# Patient Record
Sex: Female | Born: 1935 | Race: White | Hispanic: No | Smoking: Never smoker
Health system: Southern US, Community
[De-identification: ages and names within clinical notes are randomized; demographics above are authoritative.]

## PROBLEM LIST (undated history)

## (undated) DIAGNOSIS — I4821 Permanent atrial fibrillation: Secondary | ICD-10-CM

## (undated) DIAGNOSIS — E663 Overweight: Secondary | ICD-10-CM

## (undated) DIAGNOSIS — I050 Rheumatic mitral stenosis: Secondary | ICD-10-CM

## (undated) DIAGNOSIS — M1990 Unspecified osteoarthritis, unspecified site: Secondary | ICD-10-CM

## (undated) DIAGNOSIS — I340 Nonrheumatic mitral (valve) insufficiency: Secondary | ICD-10-CM

## (undated) DIAGNOSIS — E785 Hyperlipidemia, unspecified: Secondary | ICD-10-CM

## (undated) DIAGNOSIS — I499 Cardiac arrhythmia, unspecified: Secondary | ICD-10-CM

## (undated) DIAGNOSIS — I2510 Atherosclerotic heart disease of native coronary artery without angina pectoris: Secondary | ICD-10-CM

## (undated) DIAGNOSIS — I5181 Takotsubo syndrome: Secondary | ICD-10-CM

## (undated) DIAGNOSIS — I10 Essential (primary) hypertension: Secondary | ICD-10-CM

## (undated) HISTORY — DX: Essential (primary) hypertension: I10

## (undated) HISTORY — DX: Rheumatic mitral stenosis: I05.0

## (undated) HISTORY — DX: Nonrheumatic mitral (valve) insufficiency: I34.0

## (undated) HISTORY — DX: Unspecified osteoarthritis, unspecified site: M19.90

## (undated) HISTORY — DX: Hyperlipidemia, unspecified: E78.5

## (undated) HISTORY — DX: Permanent atrial fibrillation: I48.21

## (undated) HISTORY — DX: Overweight: E66.3

---

## 1999-01-02 ENCOUNTER — Encounter: Admission: RE | Admit: 1999-01-02 | Discharge: 1999-01-02 | Payer: Self-pay | Admitting: Obstetrics & Gynecology

## 2001-11-29 ENCOUNTER — Emergency Department (HOSPITAL_COMMUNITY): Admission: EM | Admit: 2001-11-29 | Discharge: 2001-11-29 | Payer: Self-pay | Admitting: *Deleted

## 2001-12-05 ENCOUNTER — Emergency Department (HOSPITAL_COMMUNITY): Admission: EM | Admit: 2001-12-05 | Discharge: 2001-12-05 | Payer: Self-pay | Admitting: Emergency Medicine

## 2002-12-08 ENCOUNTER — Ambulatory Visit (HOSPITAL_COMMUNITY): Admission: RE | Admit: 2002-12-08 | Discharge: 2002-12-08 | Payer: Self-pay | Admitting: Family Medicine

## 2002-12-08 ENCOUNTER — Encounter: Payer: Self-pay | Admitting: Family Medicine

## 2002-12-08 ENCOUNTER — Other Ambulatory Visit: Admission: RE | Admit: 2002-12-08 | Discharge: 2002-12-08 | Payer: Self-pay | Admitting: Family Medicine

## 2004-03-14 ENCOUNTER — Ambulatory Visit (HOSPITAL_COMMUNITY): Admission: RE | Admit: 2004-03-14 | Discharge: 2004-03-14 | Payer: Self-pay | Admitting: Family Medicine

## 2005-03-20 ENCOUNTER — Ambulatory Visit (HOSPITAL_COMMUNITY): Admission: RE | Admit: 2005-03-20 | Discharge: 2005-03-20 | Payer: Self-pay | Admitting: Family Medicine

## 2008-08-09 ENCOUNTER — Ambulatory Visit (HOSPITAL_COMMUNITY): Admission: RE | Admit: 2008-08-09 | Discharge: 2008-08-09 | Payer: Self-pay | Admitting: Family Medicine

## 2011-03-15 NOTE — Procedures (Signed)
[facility]  Patient:    CELISSE, CIULLA Visit Number: [id number] MRN: [id number]          Service Type: EMS Location: ED Attending Physician:  Hilario Quarry Dictated by:   Kari Baars, M.D. Admit Date:  12/05/2001 Discharge Date: 12/05/2001                            EKG Interpretations  The computer has read this as sinus arrhythmia.  I believe it is atrial fibrillation.  The ventricular response is around 80.  There is slow R-wave progression across the precordium which may indicate previous anterior myocardial infarction, and clinical correlation is suggested.  There are lateral T-wave abnormalities which are nonspecific.  IMPRESSION:  Abnormal electrocardiogram. Dictated by:   Kari Baars, M.D. Attending Physician:  Hilario Quarry DD:  12/06/01 TD:  12/07/01 Job: [id number]

## 2013-05-24 ENCOUNTER — Ambulatory Visit
Admission: RE | Admit: 2013-05-24 | Discharge: 2013-05-24 | Disposition: A | Discharge status: Home / Self Care | Payer: Medicaid Other | Source: Ambulatory Visit | Attending: Family Medicine | Admitting: Family Medicine

## 2013-05-24 ENCOUNTER — Ambulatory Visit
Admission: RE | Admit: 2013-05-24 | Discharge: 2013-05-24 | Disposition: A | Discharge status: Home / Self Care | Payer: Medicare Other | Source: Ambulatory Visit | Attending: Family Medicine | Admitting: Family Medicine

## 2013-05-24 ENCOUNTER — Other Ambulatory Visit: Payer: Self-pay | Admitting: Family Medicine

## 2013-05-24 DIAGNOSIS — M25561 Pain in right knee: Secondary | ICD-10-CM

## 2013-05-24 DIAGNOSIS — M25562 Pain in left knee: Secondary | ICD-10-CM

## 2013-09-10 ENCOUNTER — Institutional Professional Consult (permissible substitution): Payer: Medicare Other | Admitting: Cardiology

## 2013-12-24 ENCOUNTER — Encounter: Payer: Self-pay | Admitting: Cardiology

## 2013-12-24 ENCOUNTER — Ambulatory Visit (INDEPENDENT_AMBULATORY_CARE_PROVIDER_SITE_OTHER): Payer: Medicare Other | Admitting: Cardiology

## 2013-12-24 ENCOUNTER — Encounter: Payer: Self-pay | Admitting: General Surgery

## 2013-12-24 ENCOUNTER — Telehealth: Payer: Self-pay | Admitting: Cardiology

## 2013-12-24 VITALS — BP 150/68 | HR 70 | Ht 64.0 in | Wt 192.0 lb

## 2013-12-24 DIAGNOSIS — I10 Essential (primary) hypertension: Secondary | ICD-10-CM

## 2013-12-24 DIAGNOSIS — I4891 Unspecified atrial fibrillation: Secondary | ICD-10-CM

## 2013-12-24 DIAGNOSIS — R011 Cardiac murmur, unspecified: Secondary | ICD-10-CM

## 2013-12-24 DIAGNOSIS — E785 Hyperlipidemia, unspecified: Secondary | ICD-10-CM | POA: Insufficient documentation

## 2013-12-24 DIAGNOSIS — I4821 Permanent atrial fibrillation: Secondary | ICD-10-CM | POA: Insufficient documentation

## 2013-12-24 LAB — CBC
HEMATOCRIT: 36.2 % (ref 36.0–46.0)
HEMOGLOBIN: 11.9 g/dL — AB (ref 12.0–15.0)
MCHC: 32.8 g/dL (ref 30.0–36.0)
MCV: 89.9 fl (ref 78.0–100.0)
Platelets: 207 10*3/uL (ref 150.0–400.0)
RBC: 4.03 Mil/uL (ref 3.87–5.11)
RDW: 14.3 % (ref 11.5–14.6)
WBC: 6.2 10*3/uL (ref 4.5–10.5)

## 2013-12-24 LAB — BASIC METABOLIC PANEL
BUN: 15 mg/dL (ref 6–23)
CALCIUM: 10 mg/dL (ref 8.4–10.5)
CO2: 28 mEq/L (ref 19–32)
CREATININE: 0.9 mg/dL (ref 0.4–1.2)
Chloride: 101 mEq/L (ref 96–112)
GFR: 66.38 mL/min (ref >60.00)
Glucose, Bld: 93 mg/dL (ref 70–99)
Potassium: 3.9 mEq/L (ref 3.5–5.1)
Sodium: 137 mEq/L (ref 135–145)

## 2013-12-24 MED ORDER — RIVAROXABAN 20 MG PO TABS: 20.0000 mg | ORAL_TABLET | Freq: Every day | ORAL | Status: DC

## 2013-12-24 NOTE — Progress Notes (Signed)
  [address] [place], [place]  [address] Phone: [phone] Fax:  [phone]  Date:  12/24/2013   ID:  JACQUESE YARWOOD, DOB 1936-04-05, MRN [id number]  PCP:  Augustine Radar, MD  Cardiologist:  Armanda Magic, MD     History of Present Illness: Anna [name] is a 78 y.o. female with a history of HTN who was diagnosed with atrial fibrillation in October of 2014 and was started on ASA by her PCP and set up to see Cardiology but she never kept the appt. She denies any chest pain, SOB, DOE, palpitations, dizziness or syncope.  She occasionally has some LE edema.     Wt Readings from Last 3 Encounters:  12/24/13 192 lb (87.091 kg)     Past Medical History  Diagnosis Date  . Arrhythmia   . Atrial fibrillation   . Hyperlipidemia   . Hypertension   . Osteoarthritis   . Overweight     Current Outpatient Prescriptions  Medication Sig Dispense Refill  . amLODipine-olmesartan (AZOR) 5-20 MG per tablet Take 1 tablet by mouth daily.      Marland Kitchen aspirin 325 MG EC tablet Take 325 mg by mouth daily.      . celecoxib (CELEBREX) 200 MG capsule Take 200 mg by mouth 2 (two) times daily as needed.      . diclofenac sodium (VOLTAREN) 1 % GEL Apply 4 g topically 4 (four) times daily.      . Multiple Vitamins-Minerals (CENTRUM SILVER PO) Take by mouth.      . Omega-3 Fatty Acids (FISH OIL CONCENTRATE PO) Take by mouth.      . rosuvastatin (CRESTOR) 10 MG tablet Take 10 mg by mouth daily.       No current facility-administered medications for this visit.    Allergies:   No Known Allergies  Social History:  The patient  reports that she has never smoked. She does not have any smokeless tobacco history on file. She reports that she does not drink alcohol or use illicit drugs.   Family History:  The patient's family history includes Alzheimer's disease in her mother.   ROS:  Please see the history of present illness.      All other systems reviewed and negative.   PHYSICAL EXAM: VS:   BP 150/68  Pulse 70  Ht 5\' 4"  (1.626 m)  Wt 192 lb (87.091 kg)  BMI 32.94 kg/m2  SpO2 99% Well nourished, well developed, in no acute distress HEENT: normal Neck: no JVD Cardiac:  normal S1, S2; irregularly irregular; 2/6 SM at RUSB Lungs:  clear to auscultation bilaterally, no wheezing, rhonchi or rales Abd: soft, nontender, no hepatomegaly Ext: no edema Skin: warm and dry Neuro:  CNs 2-12 intact, no focal abnormalities noted  EKG:  Atrial fibrillation with CVR     ASSESSMENT AND PLAN:  1. New onset atrial fibrillation of unknown duration with controlled ventricular response.  She is completely asymptomatic.  Her CHADS VASC score is 69 (female, age > 12 and HTN)  - start Xarelto 20mg  daily.  Last renal function was normal  - instructed to stop ASA  - check NOAC panel  - followup with me in 4 weeks and if still in afib will set up for DCCV 2. HTN - well controlled  - continue Azor  3. Heart murmur  - check 2D echo to assess murmur and assess LVF  Signed, Armanda Magic, MD 12/24/2013 12:00 PM

## 2013-12-24 NOTE — Telephone Encounter (Signed)
Called and spoke to pt and I was told that they will not give her the year free. That they would only do the 30 day supply. I called Victorino Dike with Xarelto and am waiting on a return call to let me know what to tell the pt.

## 2013-12-24 NOTE — Telephone Encounter (Signed)
New Message  Pt called-- requests a call back to "activate card" to start Xarelto. Pt was not clear of her request. Please call back to discuss furhter.

## 2013-12-24 NOTE — Patient Instructions (Signed)
Your physician has recommended you make the following change in your medication: 1. Stop Aspirin  2. Start Xarelto 20 MG 1 tablet daily  Your physician recommends that you go to the lab today for a BMET and CBC  Your physician has requested that you have an echocardiogram. Echocardiography is a painless test that uses sound waves to create images of your heart. It provides your doctor with information about the size and shape of your heart and how well your heart's chambers and valves are working. This procedure takes approximately one hour. There are no restrictions for this procedure.  Your physician recommends that you schedule a follow-up appointment in: 4 weeks with Dr Mayford Knife

## 2013-12-27 ENCOUNTER — Telehealth: Payer: Self-pay | Admitting: Cardiology

## 2013-12-27 NOTE — Telephone Encounter (Signed)
New message      Calling to get test results from nurse

## 2013-12-27 NOTE — Telephone Encounter (Signed)
Notified of lab results. States she has not been able to get Xarelto 20 mg from pharmacy.  She will let us know if we need to do prior authorization. She has a 30 day card. No samples available.

## 2014-01-03 ENCOUNTER — Telehealth: Payer: Self-pay | Admitting: *Deleted

## 2014-01-03 NOTE — Telephone Encounter (Signed)
PA to [facility] for xarelto 

## 2014-01-05 NOTE — Telephone Encounter (Signed)
[facility] approved xarelto through 01/03/2014, PA # [id number]

## 2014-01-12 ENCOUNTER — Ambulatory Visit (HOSPITAL_COMMUNITY): Payer: Medicare Other | Attending: Cardiology | Admitting: Radiology

## 2014-01-12 DIAGNOSIS — Z6833 Body mass index (BMI) 33.0-33.9, adult: Secondary | ICD-10-CM | POA: Insufficient documentation

## 2014-01-12 DIAGNOSIS — E669 Obesity, unspecified: Secondary | ICD-10-CM | POA: Insufficient documentation

## 2014-01-12 DIAGNOSIS — E785 Hyperlipidemia, unspecified: Secondary | ICD-10-CM | POA: Insufficient documentation

## 2014-01-12 DIAGNOSIS — I10 Essential (primary) hypertension: Secondary | ICD-10-CM | POA: Insufficient documentation

## 2014-01-12 DIAGNOSIS — R011 Cardiac murmur, unspecified: Secondary | ICD-10-CM | POA: Insufficient documentation

## 2014-01-12 DIAGNOSIS — I4891 Unspecified atrial fibrillation: Secondary | ICD-10-CM | POA: Insufficient documentation

## 2014-01-12 DIAGNOSIS — I059 Rheumatic mitral valve disease, unspecified: Secondary | ICD-10-CM | POA: Insufficient documentation

## 2014-01-12 NOTE — Progress Notes (Signed)
Echocardiogram Performed. 

## 2014-01-21 ENCOUNTER — Ambulatory Visit: Payer: Medicare Other | Admitting: Cardiology

## 2014-03-01 ENCOUNTER — Encounter: Payer: Self-pay | Admitting: Cardiology

## 2014-03-01 ENCOUNTER — Ambulatory Visit (INDEPENDENT_AMBULATORY_CARE_PROVIDER_SITE_OTHER): Payer: Medicare Other | Admitting: Cardiology

## 2014-03-01 VITALS — BP 164/69 | HR 52 | Ht 64.0 in | Wt 189.0 lb

## 2014-03-01 DIAGNOSIS — I4891 Unspecified atrial fibrillation: Secondary | ICD-10-CM

## 2014-03-01 DIAGNOSIS — I10 Essential (primary) hypertension: Secondary | ICD-10-CM

## 2014-03-01 DIAGNOSIS — I4892 Unspecified atrial flutter: Secondary | ICD-10-CM

## 2014-03-01 DIAGNOSIS — I059 Rheumatic mitral valve disease, unspecified: Secondary | ICD-10-CM

## 2014-03-01 DIAGNOSIS — I340 Nonrheumatic mitral (valve) insufficiency: Secondary | ICD-10-CM | POA: Insufficient documentation

## 2014-03-01 NOTE — Patient Instructions (Signed)
Your physician recommends that you continue on your current medications as directed. Please refer to the Current Medication list given to you today.  Your physician recommends that you schedule a follow-up appointment in: 3 months with Dr Turner 

## 2014-03-01 NOTE — Progress Notes (Signed)
[address] [place], [place]  [address] Phone: [phone] Fax:  [phone]  Date:  03/01/2014   ID:  Anna [name], DOB 30-May-1936, MRN [id number]  PCP:  Augustine RadarOBERSON, KRISTINA, MD  Cardiologist:  Armanda Magicraci TUrner, MD     History of Present Illness:  Anna [name] is a 78 y.o. female with a history of HTN who was diagnosed with atrial fibrillation in October of 2014 and was started on ASA by her PCP and set up to see Cardiology but she never kept the appt. She saw me back in February and was in atrial fibrillation with CVR.  She was started on Xarelto due to her CHADS2VASC score of 3.  A 2D echo was done which showed normal LVF with mild to moderate MR, and mild TR and moderately dilated LA.  She was supposed to followup in March but is now here today.  She denies any chest pain, SOB, DOE, palpitations, dizziness or syncope. She occasionally has some LE edema.    Wt Readings from Last 3 Encounters:  03/01/14 189 lb (85.73 kg)  12/24/13 192 lb (87.091 kg)     Past Medical History  Diagnosis Date  . Arrhythmia   . Atrial fibrillation   . Hyperlipidemia   . Hypertension   . Osteoarthritis   . Overweight     Current Outpatient Prescriptions  Medication Sig Dispense Refill  . amLODipine-olmesartan (AZOR) 5-20 MG per tablet Take 1 tablet by mouth daily.      . celecoxib (CELEBREX) 200 MG capsule Take 200 mg by mouth 2 (two) times daily as needed.      . diclofenac sodium (VOLTAREN) 1 % GEL Apply 4 g topically 4 (four) times daily.      . Multiple Vitamins-Minerals (CENTRUM SILVER PO) Take by mouth.      . Omega-3 Fatty Acids (FISH OIL CONCENTRATE PO) Take by mouth.      . Rivaroxaban (XARELTO) 20 MG TABS tablet Take 1 tablet (20 mg total) by mouth daily with supper.  30 tablet  11  . rosuvastatin (CRESTOR) 10 MG tablet Take 10 mg by mouth daily.       No current facility-administered medications for this visit.    Allergies:   No Known Allergies  Social History:  The  patient  reports that she has never smoked. She does not have any smokeless tobacco history on file. She reports that she does not drink alcohol or use illicit drugs.   Family History:  The patient's family history includes Alzheimer's disease in her mother.   ROS:  Please see the history of present illness.      All other systems reviewed and negative.   PHYSICAL EXAM: VS:  BP 164/69  Pulse 52  Ht 5\' 4"  (1.626 m)  Wt 189 lb (85.73 kg)  BMI 32.43 kg/m2 Well nourished, well developed, in no acute distress HEENT: normal Neck: no JVD Cardiac:  normal S1, S2; irregularly irregular; no murmur Lungs:  clear to auscultation bilaterally, no wheezing, rhonchi or rales Abd: soft, nontender, no hepatomegaly Ext: 1+ edema Skin: warm and dry Neuro:  CNs 2-12 intact, no focal abnormalities noted  EKG:   Atrial flutter with CVR at 52bpm  ASSESSMENT AND PLAN: 1.  Atrial fibrillation now in atrial flutter rate controlled - she has not missed any doses of her Xarelto since being placed on it in February except for 1 dose on April 16th. - I would like to set her  up for outpt DCCV but she is hesitant.  I have reviewed the benefits and risks of rhythm vs. Rhythm control.    I have reviewed the risks and benefits with her including the risk of anesthesia, cardioverting to another rhythm that requires repeat DCCV, temporary pacing through pacing pads.  I have given her some literature to review on cardioversion and she will let me know if she wants to proceed.   2.  HTN - mildly elevated BP - Continue Azor to 5-20mg  daily 3.  Mild to moderate MR by recent echo  Followup with me in 3 months  Signed, Armanda Magic, MD 03/01/2014 8:28 AM

## 2014-03-18 ENCOUNTER — Telehealth: Payer: Self-pay | Admitting: Cardiology

## 2014-03-18 NOTE — Telephone Encounter (Signed)
New problem   Pt want to speak to nurse concerning the Jump Start Your Heart procedure. Please call pt.

## 2014-03-18 NOTE — Telephone Encounter (Signed)
Calling wanting to proceed with the cardioversion.  Advised both Dr. Mayford Knife and Jillyn Hidden out of office today.  Will forward to them as to when to schedule.

## 2014-03-21 NOTE — Telephone Encounter (Signed)
Please set up for DCCV on Thursday 5/28 with me

## 2014-03-22 ENCOUNTER — Encounter: Payer: Self-pay | Admitting: *Deleted

## 2014-03-22 ENCOUNTER — Encounter (HOSPITAL_COMMUNITY): Payer: Self-pay | Admitting: Pharmacy Technician

## 2014-03-22 NOTE — Telephone Encounter (Signed)
No.  Nothing else needs to be done at this time.

## 2014-03-22 NOTE — Telephone Encounter (Addendum)
To Triage.

## 2014-03-22 NOTE — Telephone Encounter (Signed)
Pt advised/ instructions reviewed with patient.

## 2014-03-22 NOTE — Telephone Encounter (Signed)
I put orders in do I need to do anything else?

## 2014-03-22 NOTE — Telephone Encounter (Signed)
In Basket to prior authorization

## 2014-03-22 NOTE — Telephone Encounter (Signed)
Cardioversion scheduled for 03/24/14 11AM.

## 2014-03-24 ENCOUNTER — Encounter (HOSPITAL_COMMUNITY): Payer: Medicare Other | Admitting: Certified Registered Nurse Anesthetist

## 2014-03-24 ENCOUNTER — Ambulatory Visit (HOSPITAL_COMMUNITY)
Admission: RE | Admit: 2014-03-24 | Discharge: 2014-03-24 | Disposition: A | Discharge status: Home / Self Care | Payer: Medicare Other | Source: Ambulatory Visit | Attending: Cardiology | Admitting: Cardiology

## 2014-03-24 ENCOUNTER — Telehealth: Payer: Self-pay | Admitting: Cardiology

## 2014-03-24 ENCOUNTER — Ambulatory Visit (HOSPITAL_COMMUNITY): Payer: Medicare Other | Admitting: Certified Registered Nurse Anesthetist

## 2014-03-24 ENCOUNTER — Encounter (HOSPITAL_COMMUNITY)
Admission: RE | Disposition: A | Discharge status: Home / Self Care | Payer: Self-pay | Source: Ambulatory Visit | Attending: Cardiology

## 2014-03-24 DIAGNOSIS — I10 Essential (primary) hypertension: Secondary | ICD-10-CM | POA: Diagnosis not present

## 2014-03-24 DIAGNOSIS — I4891 Unspecified atrial fibrillation: Secondary | ICD-10-CM | POA: Insufficient documentation

## 2014-03-24 DIAGNOSIS — M1990 Unspecified osteoarthritis, unspecified site: Secondary | ICD-10-CM | POA: Insufficient documentation

## 2014-03-24 DIAGNOSIS — Z7901 Long term (current) use of anticoagulants: Secondary | ICD-10-CM | POA: Insufficient documentation

## 2014-03-24 DIAGNOSIS — E785 Hyperlipidemia, unspecified: Secondary | ICD-10-CM | POA: Diagnosis not present

## 2014-03-24 DIAGNOSIS — E663 Overweight: Secondary | ICD-10-CM | POA: Insufficient documentation

## 2014-03-24 HISTORY — PX: CARDIOVERSION: SHX1299

## 2014-03-24 LAB — BASIC METABOLIC PANEL
BUN: 13 mg/dL (ref 6–23)
CHLORIDE: 100 meq/L (ref 96–112)
CO2: 25 meq/L (ref 19–32)
Calcium: 10.3 mg/dL (ref 8.4–10.5)
Creatinine, Ser: 0.99 mg/dL (ref 0.50–1.10)
GFR calc Af Amer: 63 mL/min — ABNORMAL LOW (ref >90)
GFR calc non Af Amer: 54 mL/min — ABNORMAL LOW (ref >90)
GLUCOSE: 98 mg/dL (ref 70–99)
Potassium: 4.4 mEq/L (ref 3.7–5.3)
SODIUM: 138 meq/L (ref 137–147)

## 2014-03-24 LAB — MAGNESIUM: Magnesium: 1.7 mg/dL (ref 1.5–2.5)

## 2014-03-24 SURGERY — CARDIOVERSION
Anesthesia: Monitor Anesthesia Care

## 2014-03-24 MED ORDER — HYDROCORTISONE 1 % EX CREA
1.0000 | TOPICAL_CREAM | Freq: Three times a day (TID) | CUTANEOUS | Status: DC | PRN
Start: 2014-03-24 — End: 2014-03-24
  Filled 2014-03-24: qty 28

## 2014-03-24 MED ORDER — SODIUM CHLORIDE 0.9 % IJ SOLN: 3.0000 mL | INTRAMUSCULAR | Status: DC | PRN

## 2014-03-24 MED ORDER — PROPOFOL 10 MG/ML IV BOLUS
INTRAVENOUS | Status: DC | PRN
  Administered 2014-03-24: 80 mg via INTRAVENOUS

## 2014-03-24 MED ORDER — SODIUM CHLORIDE 0.9 % IV SOLN
INTRAVENOUS | Status: DC | PRN
  Administered 2014-03-24: 10:00:00 via INTRAVENOUS

## 2014-03-24 MED ORDER — SODIUM CHLORIDE 0.9 % IJ SOLN: 3.0000 mL | Freq: Two times a day (BID) | INTRAMUSCULAR | Status: DC

## 2014-03-24 MED ORDER — SODIUM CHLORIDE 0.9 % IV SOLN: INTRAVENOUS | Status: DC

## 2014-03-24 MED ORDER — SODIUM CHLORIDE 0.9 % IV SOLN: 250.0000 mL | INTRAVENOUS | Status: DC

## 2014-03-24 NOTE — Telephone Encounter (Signed)
Please let patient know that EKG prior to her discharge from [facility] today after DCCV showed that she had gone into atrial flutter which is a more organized heart rhythm than afib. Her HR is controlled.  She is to stay on her blood thinner and followup with me at the scheduled appt to discuss further treatment.

## 2014-03-24 NOTE — H&P (Signed)
ID: DAZJA ONSTAD, DOB [birth year], MRN [id number]  PCP: Augustine Radar, MD  Cardiologist: Armanda Magic, MD  History of Present Illness:  Anna [name] is a 78 y.o. female with a history of HTN who was diagnosed with atrial fibrillation in October of 2014 and was started on ASA by her PCP and set up to see Cardiology but she never kept the appt. She saw me back in February and was in atrial fibrillation with CVR. She was started on Xarelto due to her CHADS2VASC score of 3. A 2D echo was done which showed normal LVF with mild to moderate MR, and mild TR and moderately dilated LA. She was supposed to followup in March but is now here today. She denies any chest pain, SOB, DOE, palpitations, dizziness or syncope. She occasionally has some LE edema.  Wt Readings from Last 3 Encounters:   03/01/14  189 lb (85.73 kg)   12/24/13  192 lb (87.091 kg)    Past Medical History   Diagnosis  Date   .  Arrhythmia    .  Atrial fibrillation    .  Hyperlipidemia    .  Hypertension    .  Osteoarthritis    .  Overweight     Current Outpatient Prescriptions   Medication  Sig  Dispense  Refill   .  amLODipine-olmesartan (AZOR) 5-20 MG per tablet  Take 1 tablet by mouth daily.     .  celecoxib (CELEBREX) 200 MG capsule  Take 200 mg by mouth 2 (two) times daily as needed.     .  diclofenac sodium (VOLTAREN) 1 % GEL  Apply 4 g topically 4 (four) times daily.     .  Multiple Vitamins-Minerals (CENTRUM SILVER PO)  Take by mouth.     .  Omega-3 Fatty Acids (FISH OIL CONCENTRATE PO)  Take by mouth.     .  Rivaroxaban (XARELTO) 20 MG TABS tablet  Take 1 tablet (20 mg total) by mouth daily with supper.  30 tablet  11   .  rosuvastatin (CRESTOR) 10 MG tablet  Take 10 mg by mouth daily.      No current facility-administered medications for this visit.   Allergies: No Known Allergies  Social History: The patient reports that she has never smoked. She does not have any smokeless tobacco history on file. She  reports that she does not drink alcohol or use illicit drugs.  Family History: The patient's family history includes Alzheimer's disease in her mother.  ROS: Please see the history of present illness. All other systems reviewed and negative.  PHYSICAL EXAM:  VS: BP 164/69  Pulse 52  Ht 5\' 4"  (1.626 m)  Wt 189 lb (85.73 kg)  BMI 32.43 kg/m2  Well nourished, well developed, in no acute distress  HEENT: normal  Neck: no JVD  Cardiac: normal S1, S2; irregularly irregular; no murmur  Lungs: clear to auscultation bilaterally, no wheezing, rhonchi or rales  Abd: soft, nontender, no hepatomegaly  Ext: 1+ edema  Skin: warm and dry  Neuro: CNs 2-12 intact, no focal abnormalities noted  EKG: Atrial flutter with CVR at 52bpm  ASSESSMENT AND PLAN:  1. Atrial fibrillation now in atrial flutter rate controlled  - she has not missed any doses of her Xarelto since being placed on it in February except for 1 dose on April 16th.  - She will be set up for DCCV. I have reviewed the benefits and risks of rhythm vs.  Rhythm control. I have reviewed the risks and benefits with her including the risk of anesthesia, cardioverting to another rhythm that requires repeat DCCV, temporary pacing through pacing pads.  2. HTN - mildly elevated BP  - Continue Azor to 5-20mg  daily  3. Mild to moderate MR by recent echo    Signed,  Armanda Magicraci Turner, MD

## 2014-03-24 NOTE — Anesthesia Postprocedure Evaluation (Signed)
  Anesthesia Post-op Note  Patient: Anna [name]  Procedure(s) Performed: Procedure(s): CARDIOVERSION (N/A)  Patient Location: Endoscopy Unit  Anesthesia Type:General  Level of Consciousness: awake, alert , oriented and patient cooperative  Airway and Oxygen Therapy: Patient Spontanous Breathing and Patient connected to nasal cannula oxygen  Post-op Pain: none  Post-op Assessment: Post-op Vital signs reviewed, Patient's Cardiovascular Status Stable, Respiratory Function Stable, Patent Airway and No signs of Nausea or vomiting  Post-op Vital Signs: Reviewed and stable  Last Vitals:  Filed Vitals:   03/24/14 1238  BP: 150/52  Pulse: 53  Temp:   Resp: 14    Complications: No apparent anesthesia complications

## 2014-03-24 NOTE — Discharge Instructions (Signed)
Electrical Cardioversion, Care After °Refer to this sheet in the next few weeks. These instructions provide you with information on caring for yourself after your procedure. Your health care provider may also give you more specific instructions. Your treatment has been planned according to current medical practices, but problems sometimes occur. Call your health care provider if you have any problems or questions after your procedure. °WHAT TO EXPECT AFTER THE PROCEDURE °After your procedure, it is typical to have the following sensations: °· Some redness on the skin where the shocks were delivered. If this is tender, a sunburn lotion or hydrocortisone cream may help. °· Possible return of an abnormal heart rhythm within hours or days after the procedure. °HOME CARE INSTRUCTIONS °· Only take medicine as directed by your health care provider. Be sure you understand how and when to take your medicine. °· Learn how to feel your pulse and check it often. °· Limit your activity for 48 hours after the procedure or as directed. °· Avoid or minimize caffeine and other stimulants as directed. °SEEK MEDICAL CARE IF: °· You feel like your heart is beating too fast or your pulse is not regular. °· You have any questions about your medicines. °· You have bleeding that will not stop. °SEEK IMMEDIATE MEDICAL CARE IF: °· You are dizzy or feel faint. °· It is hard to breathe or you feel short of breath. °· There is a change in discomfort in your chest. °· Your speech is slurred or you have trouble moving an arm or leg on one side of your body. °· You get a serious muscle cramp that does not go away. °· Your fingers or toes turn cold or blue. °MAKE SURE YOU:  °· Understand these instructions.   °· Will watch your condition.   °· Will get help right away if you are not doing well or get worse. °Document Released: 08/04/2013 Document Reviewed: 04/28/2013 °ExitCare® Patient Information ©2014 [employer]. ° °Monitored Anesthesia Care   °Monitored anesthesia care is an anesthesia service for a medical procedure. Anesthesia is the loss of the ability to feel pain. It is produced by medications called anesthetics. It may affect a small area of your body (local anesthesia), a large area of your body (regional anesthesia), or your entire body (general anesthesia). The need for monitored anesthesia care depends your procedure, your condition, and the potential need for regional or general anesthesia. It is often provided during procedures where:  °· General anesthesia may be needed if there are complications. This is because you need special care when you are under general anesthesia.   °· You will be under local or regional anesthesia. This is so that you are able to have higher levels of anesthesia if needed.   °· You will receive calming medications (sedatives). This is especially the case if sedatives are given to put you in a semi-conscious state of relaxation (deep sedation). This is because the amount of sedative needed to produce this state can be hard to predict. Too much of a sedative can produce general anesthesia. °Monitored anesthesia care is performed by one or more caregivers who have special training in all types of anesthesia. You will need to meet with these caregivers before your procedure. During this meeting, they will ask you about your medical history. They will also give you instructions to follow. (For example, you will need to stop eating and drinking before your procedure. You may also need to stop or change medications you are taking.) During your procedure, your   caregivers will stay with you. They will:  °· Watch your condition. This includes watching you blood pressure, breathing, and level of pain.   °· Diagnose and treat problems that occur.   °· Give medications if they are needed. These may include calming medications (sedatives) and anesthetics.   °· Make sure you are comfortable.   °Having monitored anesthesia care  does not necessarily mean that you will be under anesthesia. It does mean that your caregivers will be able to manage anesthesia if you need it or if it occurs. It also means that you will be able to have a different type of anesthesia than you are having if you need it. When your procedure is complete, your caregivers will continue to watch your condition. They will make sure any medications wear off before you are allowed to go home.  °Document Released: 07/10/2005 Document Revised: 02/08/2013 Document Reviewed: 11/25/2012 °ExitCare® Patient Information ©2014 [employer]. ° ° °

## 2014-03-24 NOTE — Transfer of Care (Signed)
Immediate Anesthesia Transfer of Care Note  Patient: Anna [name]  Procedure(s) Performed: Procedure(s): CARDIOVERSION (N/A)  Patient Location: Endoscopy Unit  Anesthesia Type:General  Level of Consciousness: awake, alert , oriented and patient cooperative  Airway & Oxygen Therapy: Patient Spontanous Breathing and Patient connected to nasal cannula oxygen  Post-op Assessment: Report given to PACU RN, Post -op Vital signs reviewed and stable and Patient moving all extremities  Post vital signs: Reviewed and stable  Complications: No apparent anesthesia complications

## 2014-03-24 NOTE — CV Procedure (Signed)
    Electrical Cardioversion Procedure Note MATTI DIPAOLA [id number] 1936/08/01  Procedure: Electrical Cardioversion Indications:  Atrial Fibrillation  Time Out: Verified patient identification, verified procedure,medications/allergies/relevent history reviewed, required imaging and test results available.  Performed  Procedure Details  The patient was NPO after midnight. Anesthesia was administered at the beside  by Dr.Rose with 80mg  of propofol.  Cardioversion was done with synchronized biphasic defibrillation with AP pads with 150watts.  The patient converted to sinus bradycardia with 1st degree AV block. The patient tolerated the procedure well   IMPRESSION:  Successful cardioversion of atrial fibrillation    Traci R Turner 03/24/2014, 10:29 AM

## 2014-03-24 NOTE — Interval H&P Note (Signed)
History and Physical Interval Note:  03/24/2014 9:49 AM  Anna [name]  has presented today for surgery, with the diagnosis of afib  The various methods of treatment have been discussed with the patient and family. After consideration of risks, benefits and other options for treatment, the patient has consented to  Procedure(s): CARDIOVERSION (N/A) as a surgical intervention .  The patient's history has been reviewed, patient examined, no change in status, stable for surgery.  I have reviewed the patient's chart and labs.  Questions were answered to the patient's satisfaction.     Quintella Reichert

## 2014-03-24 NOTE — Anesthesia Preprocedure Evaluation (Addendum)
Anesthesia Evaluation  Patient identified by MRN, date of birth, ID band Patient awake    Reviewed: Allergy & Precautions, H&P , NPO status , Patient's Chart, lab work & pertinent test results  Airway Mallampati: II TM Distance: >3 FB Neck ROM: Full    Dental  (+) Edentulous Upper, Edentulous Lower   Pulmonary neg pulmonary ROS,          Cardiovascular hypertension, Pt. on medications + dysrhythmias Atrial Fibrillation     Neuro/Psych negative neurological ROS  negative psych ROS   GI/Hepatic negative GI ROS, Neg liver ROS,   Endo/Other  negative endocrine ROS  Renal/GU negative Renal ROS  negative genitourinary   Musculoskeletal negative musculoskeletal ROS (+)   Abdominal   Peds negative pediatric ROS (+)  Hematology negative hematology ROS (+)   Anesthesia Other Findings   Reproductive/Obstetrics negative OB ROS                          Anesthesia Physical Anesthesia Plan  ASA: III  Anesthesia Plan: MAC   Post-op Pain Management:    Induction: Intravenous  Airway Management Planned: Mask  Additional Equipment:   Intra-op Plan:   Post-operative Plan:   Informed Consent: I have reviewed the patients History and Physical, chart, labs and discussed the procedure including the risks, benefits and alternatives for the proposed anesthesia with the patient or authorized representative who has indicated his/her understanding and acceptance.   Dental advisory given  Plan Discussed with: CRNA, Anesthesiologist and Surgeon  Anesthesia Plan Comments:         Anesthesia Quick Evaluation

## 2014-03-25 ENCOUNTER — Encounter (HOSPITAL_COMMUNITY): Payer: Self-pay | Admitting: Cardiology

## 2014-03-26 ENCOUNTER — Emergency Department (HOSPITAL_COMMUNITY)
Admission: EM | Admit: 2014-03-26 | Discharge: 2014-03-26 | Disposition: A | Discharge status: Home / Self Care | Payer: Medicare Other | Attending: Emergency Medicine | Admitting: Emergency Medicine

## 2014-03-26 ENCOUNTER — Encounter (HOSPITAL_COMMUNITY): Payer: Self-pay | Admitting: Emergency Medicine

## 2014-03-26 DIAGNOSIS — E663 Overweight: Secondary | ICD-10-CM | POA: Diagnosis not present

## 2014-03-26 DIAGNOSIS — E785 Hyperlipidemia, unspecified: Secondary | ICD-10-CM | POA: Insufficient documentation

## 2014-03-26 DIAGNOSIS — Z79899 Other long term (current) drug therapy: Secondary | ICD-10-CM | POA: Insufficient documentation

## 2014-03-26 DIAGNOSIS — I498 Other specified cardiac arrhythmias: Secondary | ICD-10-CM | POA: Insufficient documentation

## 2014-03-26 DIAGNOSIS — Z8739 Personal history of other diseases of the musculoskeletal system and connective tissue: Secondary | ICD-10-CM | POA: Diagnosis not present

## 2014-03-26 DIAGNOSIS — R001 Bradycardia, unspecified: Secondary | ICD-10-CM

## 2014-03-26 DIAGNOSIS — I4891 Unspecified atrial fibrillation: Secondary | ICD-10-CM | POA: Insufficient documentation

## 2014-03-26 DIAGNOSIS — I10 Essential (primary) hypertension: Secondary | ICD-10-CM | POA: Insufficient documentation

## 2014-03-26 LAB — CBC
HCT: 32.2 % — ABNORMAL LOW (ref 36.0–46.0)
HEMOGLOBIN: 11 g/dL — AB (ref 12.0–15.0)
MCH: 29.3 pg (ref 26.0–34.0)
MCHC: 34.2 g/dL (ref 30.0–36.0)
MCV: 85.6 fL (ref 78.0–100.0)
Platelets: 184 10*3/uL (ref 150–400)
RBC: 3.76 MIL/uL — AB (ref 3.87–5.11)
RDW: 14 % (ref 11.5–15.5)
WBC: 5.5 10*3/uL (ref 4.0–10.5)

## 2014-03-26 LAB — BASIC METABOLIC PANEL
BUN: 21 mg/dL (ref 6–23)
CO2: 22 meq/L (ref 19–32)
Calcium: 10 mg/dL (ref 8.4–10.5)
Chloride: 99 mEq/L (ref 96–112)
Creatinine, Ser: 1.13 mg/dL — ABNORMAL HIGH (ref 0.50–1.10)
GFR calc Af Amer: 53 mL/min — ABNORMAL LOW (ref >90)
GFR calc non Af Amer: 46 mL/min — ABNORMAL LOW (ref >90)
GLUCOSE: 130 mg/dL — AB (ref 70–99)
Potassium: 4.4 mEq/L (ref 3.7–5.3)
Sodium: 133 mEq/L — ABNORMAL LOW (ref 137–147)

## 2014-03-26 NOTE — Discharge Instructions (Signed)
Bradycardia Bradycardia is a term for a heart rate (pulse) that, in adults, is slower than 60 beats per minute. A normal rate is 60 to 100 beats per minute. A heart rate below 60 beats per minute may be normal for some adults with healthy hearts. If the rate is too slow, the heart may have trouble pumping the volume of blood the body needs. If the heart rate gets too low, blood flow to the brain may be decreased and may make you feel lightheaded, dizzy, or faint. The heart has a natural pacemaker in the top of the heart called the SA node (sinoatrial or sinus node). This pacemaker sends out regular electrical signals to the muscle of the heart, telling the heart muscle when to beat (contract). The electrical signal travels from the upper parts of the heart (atria) through the AV node (atrioventricular node), to the lower chambers of the heart (ventricles). The ventricles squeeze, pumping the blood from your heart to your lungs and to the rest of your body. CAUSES   Problem with the heart's electrical system.  Problem with the heart's natural pacemaker.  Heart disease, damage, or infection.  Medications.  Problems with minerals and salts (electrolytes). SYMPTOMS   Fainting (syncope).  Fatigue and weakness.  Shortness of breath (dyspnea).  Chest pain (angina).  Drowsiness.  Confusion. DIAGNOSIS   An electrocardiogram (ECG) can help your caregiver determine the type of slow heart rate you have.  If the cause is not seen on an ECG, you may need to wear a heart monitor that records your heart rhythm for several hours or days.  Blood tests. TREATMENT   Electrolyte supplements.  Medications.  Withholding medication which is causing a slow heart rate.  Pacemaker placement. SEEK IMMEDIATE MEDICAL CARE IF:   You feel lightheaded or faint.  You develop an irregular heart rate.  You feel chest pain or have trouble breathing. MAKE SURE YOU:   Understand these  instructions.  Will watch your condition.  Will get help right away if you are not doing well or get worse. Document Released: 07/06/2002 Document Revised: 01/06/2012 Document Reviewed: 06/01/2008 [employer] Patient Information 2014 [employer].  

## 2014-03-26 NOTE — ED Notes (Signed)
Pt from home with c/o, pt has cardioversion done recently and had a home visit follow up where nurse found pt Heart Rate to be 42. Pt Heart Rate 47 in triage. Pt has no complaints at present.

## 2014-03-26 NOTE — ED Provider Notes (Signed)
CSN: [id number]     Arrival date & time 03/26/14  1557 History   First MD Initiated Contact with Patient 03/26/14 1607     Chief Complaint  Patient presents with  . Bradycardia     (Consider location/radiation/quality/duration/timing/severity/associated sxs/prior Treatment) HPI Comments: Patient is a 78 y.o. Female who 2 days post cardioversion for afib who presents to the ED after being found to have low heart rate by home health nurse.  Patient has no current complaints including no CP, SOB, fatigue, dizziness, or diaphoresis.  Home health nurse informed patient that she needed to come to ER for evaluation.  Review of MR shows that after DCCV patient was noted to have a HR of 53 with atrial flutter rhythm.    Patient is a 78 y.o. female presenting with general illness. The history is provided by the patient and medical records. No language interpreter was used.  Illness Severity:  Unable to specify Onset quality:  Unable to specify Timing:  Constant Progression:  Unchanged Chronicity:  New Associated symptoms: no abdominal pain, no chest pain, no cough, no fever, no nausea, no shortness of breath, no vomiting and no wheezing     Past Medical History  Diagnosis Date  . Arrhythmia   . Atrial fibrillation   . Hyperlipidemia   . Hypertension   . Osteoarthritis   . Overweight    Past Surgical History  Procedure Laterality Date  . Cardioversion N/A 03/24/2014    Procedure: CARDIOVERSION;  Surgeon: Quintella Reichert, MD;  Location: [facility];  Service: Cardiovascular;  Laterality: N/A;   Family History  Problem Relation Age of Onset  . Alzheimer's disease Mother    History  Substance Use Topics  . Smoking status: Never Smoker   . Smokeless tobacco: Not on file  . Alcohol Use: No   OB History   Grav Para Term Preterm Abortions TAB SAB Ect Mult Living                 Review of Systems  Constitutional: Negative for fever.  Respiratory: Negative for cough, shortness of  breath and wheezing.   Cardiovascular: Negative for chest pain.  Gastrointestinal: Negative for nausea, vomiting and abdominal pain.  All other systems reviewed and are negative.     Allergies  Review of patient's allergies indicates no known allergies.  Home Medications   Prior to Admission medications   Medication Sig Start Date End Date Taking? Authorizing Provider  amLODipine-olmesartan (AZOR) 5-20 MG per tablet Take 1 tablet by mouth daily.    Historical Provider, MD  diclofenac sodium (VOLTAREN) 1 % GEL Apply 4 g topically 4 (four) times daily.    Historical Provider, MD  docusate sodium (COLACE) 100 MG capsule Take 100 mg by mouth daily.     Historical Provider, MD  Multiple Vitamins-Minerals (CENTRUM SILVER PO) Take 1 tablet by mouth daily.     Historical Provider, MD  Omega-3 Fatty Acids (FISH OIL CONCENTRATE PO) Take 1 capsule by mouth daily.     Historical Provider, MD  rivaroxaban (XARELTO) 20 MG TABS tablet Take 20 mg by mouth daily with supper. 12/24/13   Quintella Reichert, MD  rosuvastatin (CRESTOR) 10 MG tablet Take 10 mg by mouth daily.    Historical Provider, MD   BP 155/46  Pulse 47  Temp(Src) 98.2 F (36.8 C) (Oral)  Resp 18  Ht 5\' 4"  (1.626 m)  Wt 193 lb (87.544 kg)  BMI 33.11 kg/m2  SpO2 95% Physical Exam  Nursing note and vitals reviewed. Constitutional: She is oriented to person, place, and time. She appears well-developed and well-nourished.  HENT:  Head: Normocephalic and atraumatic.  Right Ear: External ear normal.  Left Ear: External ear normal.  Nose: Nose normal.  Eyes: Conjunctivae and EOM are normal. Pupils are equal, round, and reactive to light.  Neck: Normal range of motion. Neck supple.  Cardiovascular: Regular rhythm and intact distal pulses.  Bradycardia present.   Pulmonary/Chest: Effort normal and breath sounds normal. No respiratory distress. She has no wheezes.  Abdominal: Soft. Bowel sounds are normal.  Musculoskeletal: Normal range  of motion. She exhibits no edema and no tenderness.  Neurological: She is alert and oriented to person, place, and time. She has normal reflexes.  Skin: Skin is warm and dry.  Psychiatric: She has a normal mood and affect.    ED Course  Procedures (including critical care time) Labs Review Labs Reviewed  CBC - Abnormal; Notable for the following:    RBC 3.76 (*)    Hemoglobin 11.0 (*)    HCT 32.2 (*)    All other components within normal limits  BASIC METABOLIC PANEL    Imaging Review No results found.   EKG Interpretation   Date/Time:  Saturday Mar 26 2014 16:02:09 EDT Ventricular Rate:  49 PR Interval:    QRS Duration: 84 QT Interval:  420 QTC Calculation: 379 R Axis:   -2 Text Interpretation:  Junctional bradycardia Low voltage QRS Confirmed by  Denton LankSTEINL  MD, Caryn BeeKEVIN ([id number]) on 03/26/2014 4:36:23 PM        Date: 03/26/2014  Rate: 49  Rhythm: junctional  QRS Axis: normal  Intervals: other than abnormal PR no abnormalities  ST/T Wave abnormalities: normal  Conduction Disutrbances: none  Narrative Interpretation:   Old EKG Reviewed: EKG from 5/28 showed a flutter with rate of 53    MDM   Final diagnoses:  Bradycardia    Patient presents at the request of her home health nurse after she was found to have a low heart rate. Patient has no complaints on arrival. Patient is 2 days status post cardioversion. Vital signs remarkable for heart rate in the 40s. No hypotension was noted. Patient had no abnormal physical exam findings specifically no signs of Lyme overload, no diaphoresis, no neurological deficits. EKG shows junctional rhythm with a rate of 49. When compared to her EKG completed 2 days ago there was no specific evidence of atrial flutter that was seen on EKG after cardioversion. Basic labs were obtained and remarkable for no a left light disturbances or other abnormalities requiring intervention. Given recent intervention by cardiology was felt that consult was  warranted.  Cards recommended ambulation and if HR elevated appropriately and patient remained without symptoms she could be discharge with close follow up.  Patient able to ambulate without symptoms and HR went from 40s to 60s.  Patient informed of plan and she was in agreement.      Johnney Ouerek Quintavius Niebuhr, MD 03/27/14 [phone]

## 2014-03-26 NOTE — ED Notes (Signed)
Ambulating pulse ox and heart rate check. Pulse was in the 60's and 70's. Pt denies any SOB.

## 2014-03-27 NOTE — ED Provider Notes (Signed)
I saw and evaluated the patient, reviewed the resident's note and I agree with the findings and plan.   EKG Interpretation   Date/Time:  Saturday Mar 26 2014 16:02:09 EDT Ventricular Rate:  49 PR Interval:    QRS Duration: 84 QT Interval:  420 QTC Calculation: 379 R Axis:   -2 Text Interpretation:  Junctional bradycardia Low voltage QRS Confirmed by  Denton Lank  MD, Caryn Bee ([id number]) on 03/26/2014 4:36:23 PM      Pt noted by home health provider to have low heart rate. Hx same. Pt denies feeling faint, weak or dizzy, states feels quite well. No cp or sob. Monitor. Discuss w cardiology.     Suzi Roots, MD 03/27/14 [phone]

## 2014-04-08 ENCOUNTER — Encounter: Payer: Medicare Other | Admitting: Cardiology

## 2014-06-14 ENCOUNTER — Encounter: Payer: Self-pay | Admitting: Cardiology

## 2014-06-14 ENCOUNTER — Ambulatory Visit (INDEPENDENT_AMBULATORY_CARE_PROVIDER_SITE_OTHER): Payer: Medicare Other | Admitting: Cardiology

## 2014-06-14 VITALS — BP 144/78 | HR 56 | Ht 64.0 in | Wt 187.1 lb

## 2014-06-14 DIAGNOSIS — I4892 Unspecified atrial flutter: Secondary | ICD-10-CM

## 2014-06-14 DIAGNOSIS — I4819 Other persistent atrial fibrillation: Secondary | ICD-10-CM

## 2014-06-14 DIAGNOSIS — I4891 Unspecified atrial fibrillation: Secondary | ICD-10-CM

## 2014-06-14 DIAGNOSIS — I10 Essential (primary) hypertension: Secondary | ICD-10-CM

## 2014-06-14 DIAGNOSIS — I340 Nonrheumatic mitral (valve) insufficiency: Secondary | ICD-10-CM

## 2014-06-14 DIAGNOSIS — I059 Rheumatic mitral valve disease, unspecified: Secondary | ICD-10-CM

## 2014-06-14 NOTE — Patient Instructions (Signed)
Your physician recommends that you continue on your current medications as directed. Please refer to the Current Medication list given to you today.  Your physician wants you to follow-up in: 6 months with Dr Turner You will receive a reminder letter in the mail two months in advance. If you don't receive a letter, please call our office to schedule the follow-up appointment.  

## 2014-06-14 NOTE — Progress Notes (Signed)
[address] [place], [place]  [address] Phone: [phone] Fax:  [phone]  Date:  06/14/2014   ID:  Anna [name], DOB [birth year], MRN [id number]  PCP:  Anna RadarOBERSON, KRISTINA, MD  Cardiologist:  Anna Magicraci Ara Mano, MD     History of Present Illness: Anna [name] is a 78 y.o. female with a history of HTN who was diagnosed with atrial fibrillation in October of 2014 and was started on ASA by her PCP and set up to see Cardiology but she never kept the appt. She saw me back in February and was in atrial fibrillation with CVR. She was started on Xarelto due to her CHADS2VASC score of 3. A 2D echo was done which showed normal LVF with mild to moderate MR, and mild TR and moderately dilated LA. She was supposed to followup in March but did not see me back until 03/24/2014. She was still in afib at that time and subsequently underwent DCCV to NSR but then reverted back to atrial flutter.  She now presents for followup.  She denies any chest pain but occasionally has some DOE and has to sit down to rest.  She occasionally has some LE edema.  Her main complaint is chronic knee pain which limits her ability to ambulate.  She denies any palpitations, dizziness or syncope.    Wt Readings from Last 3 Encounters:  06/14/14 187 lb 1.9 oz (84.877 kg)  03/26/14 193 lb (87.544 kg)  03/01/14 189 lb (85.73 kg)     Past Medical History  Diagnosis Date  . Arrhythmia   . Atrial fibrillation   . Hyperlipidemia   . Hypertension   . Osteoarthritis   . Overweight(278.02)     Current Outpatient Prescriptions  Medication Sig Dispense Refill  . amLODipine-olmesartan (AZOR) 5-20 MG per tablet Take 1 tablet by mouth daily.      . diclofenac sodium (VOLTAREN) 1 % GEL Apply 4 g topically 2 (two) times daily. Apply to legs      . docusate sodium (COLACE) 100 MG capsule Take 100 mg by mouth daily.       . Multiple Vitamins-Minerals (CENTRUM SILVER PO) Take 1 tablet by mouth daily.       . Omega-3 Fatty  Acids (FISH OIL CONCENTRATE PO) Take 1 capsule by mouth daily.       . rivaroxaban (XARELTO) 20 MG TABS tablet Take 20 mg by mouth daily with supper.      . rosuvastatin (CRESTOR) 10 MG tablet Take 10 mg by mouth daily.      Marland Kitchen. gabapentin (NEURONTIN) 100 MG capsule        No current facility-administered medications for this visit.    Allergies:   No Known Allergies  Social History:  The patient  reports that she has never smoked. She does not have any smokeless tobacco history on file. She reports that she does not drink alcohol or use illicit drugs.   Family History:  The patient's family history includes Alzheimer's disease in her mother.   ROS:  Please see the history of present illness.      All other systems reviewed and negative.   PHYSICAL EXAM: VS:  BP 144/78  Pulse 56  Ht 5\' 4"  (1.626 m)  Wt 187 lb 1.9 oz (84.877 kg)  BMI 32.10 kg/m2 Well nourished, well developed, in no acute distress HEENT: normal Neck: no JVD Cardiac:  normal S1, S2; irregularly irregular; no murmur Lungs:  clear to auscultation bilaterally,  no wheezing, rhonchi or rales Abd: soft, nontender, no hepatomegaly Ext: no edema Skin: warm and dry Neuro:  CNs 2-12 intact, no focal abnormalities noted  EKG:  Atrial flutter with SVR at 56bpm     ASSESSMENT AND PLAN:  1. Atrial fibrillation s/p DCCV 02/2014 and now in atrial flutter rate controlled.  We have discussed rate vs. Rhythm control.  She is fairly asymptomatic and she has a moderately enlarged LA with mild to moderate MR and so would most likely be difficult to maintain NSR.  She would like to pursue rate control at this time. - continue Xarelto 2. HTN - controlled - Continue Azor to 5-20mg  daily  3. Mild to moderate MR   Followup with me in 6 months  Signed, Anna Magic, MD 06/14/2014 4:24 PM

## 2014-06-17 ENCOUNTER — Ambulatory Visit: Payer: Medicare Other | Admitting: Cardiology

## 2014-10-17 ENCOUNTER — Encounter: Payer: Self-pay | Admitting: Cardiology

## 2014-12-16 ENCOUNTER — Encounter: Payer: Self-pay | Admitting: Cardiology

## 2014-12-16 ENCOUNTER — Ambulatory Visit (INDEPENDENT_AMBULATORY_CARE_PROVIDER_SITE_OTHER): Payer: Medicare Other | Admitting: Cardiology

## 2014-12-16 VITALS — BP 142/70 | HR 61 | Ht 64.0 in | Wt 192.4 lb

## 2014-12-16 DIAGNOSIS — I10 Essential (primary) hypertension: Secondary | ICD-10-CM | POA: Diagnosis not present

## 2014-12-16 DIAGNOSIS — I340 Nonrheumatic mitral (valve) insufficiency: Secondary | ICD-10-CM | POA: Diagnosis not present

## 2014-12-16 DIAGNOSIS — I482 Chronic atrial fibrillation, unspecified: Secondary | ICD-10-CM

## 2014-12-16 LAB — CBC WITH DIFFERENTIAL/PLATELET
Basophils Absolute: 0 10*3/uL (ref 0.0–0.1)
Basophils Relative: 0.7 % (ref 0.0–3.0)
EOS ABS: 0.1 10*3/uL (ref 0.0–0.7)
Eosinophils Relative: 1.9 % (ref 0.0–5.0)
HCT: 35.4 % — ABNORMAL LOW (ref 36.0–46.0)
Hemoglobin: 11.9 g/dL — ABNORMAL LOW (ref 12.0–15.0)
Lymphocytes Relative: 29.7 % (ref 12.0–46.0)
Lymphs Abs: 1.7 10*3/uL (ref 0.7–4.0)
MCHC: 33.6 g/dL (ref 30.0–36.0)
MCV: 85.7 fl (ref 78.0–100.0)
Monocytes Absolute: 0.4 10*3/uL (ref 0.1–1.0)
Monocytes Relative: 7.6 % (ref 3.0–12.0)
NEUTROS PCT: 60.1 % (ref 43.0–77.0)
Neutro Abs: 3.4 10*3/uL (ref 1.4–7.7)
Platelets: 205 10*3/uL (ref 150.0–400.0)
RBC: 4.13 Mil/uL (ref 3.87–5.11)
RDW: 14.5 % (ref 11.5–15.5)
WBC: 5.7 10*3/uL (ref 4.0–10.5)

## 2014-12-16 LAB — BASIC METABOLIC PANEL
BUN: 24 mg/dL — ABNORMAL HIGH (ref 6–23)
CALCIUM: 10.3 mg/dL (ref 8.4–10.5)
CO2: 29 mEq/L (ref 19–32)
Chloride: 102 mEq/L (ref 96–112)
Creatinine, Ser: 1.1 mg/dL (ref 0.40–1.20)
GFR: 51.18 mL/min — AB (ref >60.00)
Glucose, Bld: 97 mg/dL (ref 70–99)
POTASSIUM: 4.7 meq/L (ref 3.5–5.1)
Sodium: 136 mEq/L (ref 135–145)

## 2014-12-16 NOTE — Progress Notes (Signed)
Cardiology Office Note   Date:  12/16/2014   ID:  Anna [name], DOB 28-Apr-1936, MRN [id number]  PCP:  Augustine Radar, MD  Cardiologist:   Quintella Reichert, MD   Chief Complaint  Patient presents with  . Hypertension  . Atrial Fibrillation  . Mitral Regurgitation      History of Present Illness: Anna [name] is a 79 y.o. female with a history of HTN, atrial fibrillation.  She was started on Xarelto due to her CHADS2VASC score of 3. A 2D echo was done which showed normal LVF with mild to moderate MR, and mild TR and moderately dilated LA. She underwent DCCV to NSR but then reverted back to atrial flutter it was decided that we would pursue rate control. She now presents for followup. She denies any chest pain but occasionally has some DOE when going up stairs. She occasionally has some LE edema. Her main complaint is chronic knee pain which limits her ability to ambulate. She denies any palpitations, dizziness or syncope.   Past Medical History  Diagnosis Date  . Arrhythmia   . Atrial fibrillation   . Hyperlipidemia   . Hypertension   . Osteoarthritis   . Overweight(278.02)     Past Surgical History  Procedure Laterality Date  . Cardioversion N/A 03/24/2014    Procedure: CARDIOVERSION;  Surgeon: Quintella Reichert, MD;  Location: [facility];  Service: Cardiovascular;  Laterality: N/A;     Current Outpatient Prescriptions  Medication Sig Dispense Refill  . amLODipine-olmesartan (AZOR) 5-20 MG per tablet Take 1 tablet by mouth daily.    Marland Kitchen docusate sodium (COLACE) 100 MG capsule Take 100 mg by mouth daily.     Marland Kitchen gabapentin (NEURONTIN) 100 MG capsule     . Multiple Vitamins-Minerals (CENTRUM SILVER PO) Take 1 tablet by mouth daily.     . Omega-3 Fatty Acids (FISH OIL CONCENTRATE PO) Take 1 capsule by mouth daily.     . rivaroxaban (XARELTO) 20 MG TABS tablet Take 20 mg by mouth daily with supper.    . rosuvastatin (CRESTOR) 10 MG tablet Take 10 mg by mouth daily.     . diclofenac sodium (VOLTAREN) 1 % GEL Apply 4 g topically 2 (two) times daily. Apply to legs     No current facility-administered medications for this visit.    Allergies:   Review of patient's allergies indicates no known allergies.    Social History:  The patient  reports that she has never smoked. She does not have any smokeless tobacco history on file. She reports that she does not drink alcohol or use illicit drugs.   Family History:  The patient's family history includes Alzheimer's disease in her mother.    ROS:  Please see the history of present illness.   Otherwise, review of systems are positive for none.   All other systems are reviewed and negative.    PHYSICAL EXAM: VS:  BP 142/70 mmHg  Pulse 61  Ht 5\' 4"  (1.626 m)  Wt 192 lb 6.4 oz (87.272 kg)  BMI 33.01 kg/m2  SpO2 98% , BMI Body mass index is 33.01 kg/(m^2). GEN: Well nourished, well developed, in no acute distress HEENT: normal Neck: no JVD, carotid bruits, or masses Cardiac: irregularly irregular; no murmurs, rubs, or gallops,no edema  Respiratory:  clear to auscultation bilaterally, normal work of breathing GI: soft, nontender, nondistended, + BS MS: no deformity or atrophy Skin: warm and dry, no rash Neuro:  Strength and sensation are intact  Psych: euthymic mood, full affect   EKG:  EKG is not ordered today.    Recent Labs: 03/24/2014: Magnesium 1.7 03/26/2014: BUN 21; Creatinine 1.13*; Hemoglobin 11.0*; Platelets 184; Potassium 4.4; Sodium 133*    Lipid Panel No results found for: CHOL, TRIG, HDL, CHOLHDL, VLDL, LDLCALC, LDLDIRECT    Wt Readings from Last 3 Encounters:  12/16/14 192 lb 6.4 oz (87.272 kg)  06/14/14 187 lb 1.9 oz (84.877 kg)  03/26/14 193 lb (87.544 kg)       ASSESSMENT AND PLAN:  1. Atrial fibrillation s/p DCCV 02/2014 and now in atrial flutter rate controlled.  - continue Xarelto - Check NOAC panel today 2. HTN - controlled - Continue Azor to 5-20mg  daily  3. Mild to  moderate MR   Current medicines are reviewed at length with the patient today.  The patient does not have concerns regarding medicines.  The following changes have been made:  no change  Labs/ tests ordered today include: BMET/CBC    Disposition:   FU with me in 6 months   Signed, Quintella Reichert, MD  12/16/2014 1:28 PM    [facility] [address] [place], [place], [place]  [address] Phone: [phone]; Fax: [phone]

## 2014-12-16 NOTE — Patient Instructions (Signed)
Your physician recommends that you continue on your current medications as directed. Please refer to the Current Medication list given to you today.  Your physician recommends that you have lab work:  TODAY  Your physician wants you to follow-up in: 6 months with Dr. Turner.  You will receive a reminder letter in the mail two months in advance. If you don't receive a letter, please call our office to schedule the follow-up appointment.  

## 2014-12-25 ENCOUNTER — Other Ambulatory Visit: Payer: Self-pay | Admitting: Cardiology

## 2015-03-10 ENCOUNTER — Ambulatory Visit: Payer: Medicaid Other | Attending: Internal Medicine | Admitting: Internal Medicine

## 2015-03-10 ENCOUNTER — Encounter: Payer: Self-pay | Admitting: Internal Medicine

## 2015-03-10 VITALS — BP 158/71 | HR 85 | Temp 98.2°F | Ht 64.0 in | Wt 195.0 lb

## 2015-03-10 DIAGNOSIS — I482 Chronic atrial fibrillation, unspecified: Secondary | ICD-10-CM

## 2015-03-10 DIAGNOSIS — E785 Hyperlipidemia, unspecified: Secondary | ICD-10-CM | POA: Diagnosis not present

## 2015-03-10 DIAGNOSIS — M170 Bilateral primary osteoarthritis of knee: Secondary | ICD-10-CM | POA: Diagnosis not present

## 2015-03-10 DIAGNOSIS — M1990 Unspecified osteoarthritis, unspecified site: Secondary | ICD-10-CM | POA: Insufficient documentation

## 2015-03-10 DIAGNOSIS — I10 Essential (primary) hypertension: Secondary | ICD-10-CM | POA: Diagnosis not present

## 2015-03-10 MED ORDER — DICLOFENAC SODIUM 1 % TD GEL: 4.0000 g | Freq: Four times a day (QID) | TRANSDERMAL | Status: DC

## 2015-03-10 NOTE — Patient Instructions (Signed)

## 2015-03-10 NOTE — Progress Notes (Signed)
Patient ID: Anna [name], female   DOB: [birth year], 79 y.o.   MRN: [id number]  BJY:[id number]  YQM:[id number]  DOB - 10-03-1936  CC:  Chief Complaint  Patient presents with  . Establish Care  . Knee Pain       HPI: Anna [name] is a 79 y.o. female here today to establish medical care. Patient has a past medical history of A. Fib, HLD, HTN, osteoarthritis, and obesity.  She reports that she had a cardioversion for A.fib last year but converted back to atrial flutter shortly after. She has a cardiologist who is currently focusing on rate control. She has been on Xarelto for a CHADS2VASC score of 3. Last Echo reveal normal LVF with MR and mild TR.  Today she has chronic bilateral knee pain from osteoarthritis. She states that she was on Celebrex and Voltaren gel but states that her cardiologist d/c'ed the celebrex due to interaction with Xarelto. She is requesting a refill of her Voltaren gel. The Voltaren gel has been helping her pain significantly.   Patient has No headache, No chest pain, No abdominal pain - No Nausea, No new weakness tingling or numbness, No Cough - SOB.  No Known Allergies Past Medical History  Diagnosis Date  . Arrhythmia   . Atrial fibrillation   . Hyperlipidemia   . Hypertension   . Osteoarthritis   . Overweight(278.02)    Current Outpatient Prescriptions on File Prior to Visit  Medication Sig Dispense Refill  . amLODipine-olmesartan (AZOR) 5-20 MG per tablet Take 1 tablet by mouth daily.    Marland Kitchen docusate sodium (COLACE) 100 MG capsule Take 100 mg by mouth daily.     Marland Kitchen gabapentin (NEURONTIN) 100 MG capsule     . Multiple Vitamins-Minerals (CENTRUM SILVER PO) Take 1 tablet by mouth daily.     . Omega-3 Fatty Acids (FISH OIL CONCENTRATE PO) Take 1 capsule by mouth daily.     . rosuvastatin (CRESTOR) 10 MG tablet Take 10 mg by mouth daily.    Anna [name] 20 MG TABS tablet TAKE 1 TABLET BY MOUTH EVERY DAY WITH SUPPER 30 tablet 3  . diclofenac sodium (VOLTAREN) 1 %  GEL Apply 4 g topically 2 (two) times daily. Apply to legs     No current facility-administered medications on file prior to visit.   Family History  Problem Relation Age of Onset  . Alzheimer's disease Mother    History   Social History  . Marital Status: Divorced    Spouse Name: N/A  . Number of Children: N/A  . Years of Education: N/A   Occupational History  . Not on file.   Social History Main Topics  . Smoking status: Never Smoker   . Smokeless tobacco: Not on file  . Alcohol Use: No  . Drug Use: No  . Sexual Activity: Not on file   Other Topics Concern  . Not on file   Social History Narrative    Review of Systems: Constitutional: Negative for fever, chills, diaphoresis, activity change, appetite change and fatigue. HENT: Negative for ear pain, nosebleeds, congestion, facial swelling, rhinorrhea, neck pain, neck stiffness and ear discharge.  Eyes: Negative for pain, discharge, redness, itching and visual disturbance. Respiratory: Negative for cough, choking, chest tightness, shortness of breath, wheezing and stridor.  Cardiovascular: Negative for chest pain, palpitations and leg swelling. Gastrointestinal: Negative for abdominal distention. Genitourinary: Negative for dysuria, urgency, frequency, hematuria, flank pain, decreased urine volume, difficulty urinating and dyspareunia.  Musculoskeletal: Negative for back pain,  joint swelling, arthralgia and gait problem. + joint pain  Neurological: Negative for dizziness, tremors, seizures, syncope, facial asymmetry, speech difficulty, weakness, light-headedness, numbness and headaches.  Hematological: Negative for adenopathy. Does not bruise/bleed easily. Psychiatric/Behavioral: Negative for hallucinations, behavioral problems, confusion, dysphoric mood, decreased concentration and agitation.    Objective:   Filed Vitals:   03/10/15 1425  BP: 158/71  Pulse: 85  Temp: 98.2 F (36.8 C)    Physical Exam    Constitutional: She is oriented to person, place, and time.  Cardiovascular: Normal rate, regular rhythm and normal heart sounds.   Pt appears to be in a normal regular rhythm today  Pulmonary/Chest: Effort normal and breath sounds normal.  Musculoskeletal: She exhibits tenderness. She exhibits no edema.  Ambulates with cane Unable to climb on exam table d/t limited ROM of knees  Neurological: She is alert and oriented to person, place, and time.  Skin: Skin is warm and dry.     Lab Results  Component Value Date   WBC 5.7 12/16/2014   HGB 11.9* 12/16/2014   HCT 35.4* 12/16/2014   MCV 85.7 12/16/2014   PLT 205.0 12/16/2014   Lab Results  Component Value Date   CREATININE 1.10 12/16/2014   BUN 24* 12/16/2014   NA 136 12/16/2014   K 4.7 12/16/2014   CL 102 12/16/2014   CO2 29 12/16/2014    No results found for: HGBA1C Lipid Panel  No results found for: CHOL, TRIG, HDL, CHOLHDL, VLDL, LDLCALC     Assessment and plan:   Anna [name] was seen today for establish care and knee pain.  Diagnoses and all orders for this visit:  Osteoarthritis of both knees, unspecified osteoarthritis type Orders: -     diclofenac sodium (VOLTAREN) 1 % GEL; Apply 4 g topically 4 (four) times daily. Apply to legs Prior authorization completed during visit. She will be able to get Voltaren gel for knee pain  Essential hypertension, benign Continue current therapy. BP has been below 150/90 on all visits this years per review of chart. Was last seen by Cardiology who felt patient is controlled. Will continue to monitor BP on subsequent visits.  Chronic atrial fibrillation Continue Xarelto.  Hyperlipemia Continue Crestor, diet discussed   Health Maintenance She is up to date on all immunizations. She states that she has not had a colonoscopy in the past because she refuses to have that done.   Return in about 3 months (around 06/10/2015).    Holland Commons, NP-C [facility] [phone] 03/10/2015, 2:48 PM

## 2015-03-10 NOTE — Progress Notes (Signed)
Patient here to establish care.  She has atrial fib and is status post cardioversion She complains of bilateral, chronic knee pain for which she was taking Celebrex and Voltaren cream.  Her cardiologist took her iff of the celebrex because it interfered witj her Xarelto.  She would like Voltaren but says her insurance company will not pay for it unless PCP states it is necessary.

## 2015-03-16 ENCOUNTER — Telehealth: Payer: Self-pay | Admitting: Internal Medicine

## 2015-03-16 NOTE — Telephone Encounter (Signed)
Patient called stating that medication diclofenac sodium (VOLTAREN) 1 % GEL. Was not received at the pharmacy and she has been unable to get it

## 2015-03-20 ENCOUNTER — Telehealth: Payer: Self-pay | Admitting: *Deleted

## 2015-03-20 DIAGNOSIS — M170 Bilateral primary osteoarthritis of knee: Secondary | ICD-10-CM

## 2015-03-20 NOTE — Telephone Encounter (Signed)
May change prescription

## 2015-03-20 NOTE — Telephone Encounter (Signed)
Patient called in saying she picked up her Voltaren gel and it was a 100 gram tube which will only last her 1 week.  She is used to getting 400 gram monthly supply.  Patient is asking if we can change the prescription to reflect this.  Routing message to PCP for approval.

## 2015-03-23 ENCOUNTER — Encounter: Payer: Self-pay | Admitting: Cardiology

## 2015-03-24 ENCOUNTER — Other Ambulatory Visit: Payer: Self-pay | Admitting: *Deleted

## 2015-03-24 DIAGNOSIS — M170 Bilateral primary osteoarthritis of knee: Secondary | ICD-10-CM

## 2015-03-24 MED ORDER — DICLOFENAC SODIUM 1 % TD GEL: 4.0000 g | Freq: Four times a day (QID) | TRANSDERMAL | Status: DC

## 2015-03-24 NOTE — Telephone Encounter (Signed)
Per PCP may change Voltaren Rx

## 2015-04-24 ENCOUNTER — Other Ambulatory Visit: Payer: Self-pay

## 2015-04-24 ENCOUNTER — Other Ambulatory Visit: Payer: Self-pay | Admitting: Cardiology

## 2015-04-24 MED ORDER — RIVAROXABAN 20 MG PO TABS: ORAL_TABLET | ORAL | Status: DC

## 2015-04-27 ENCOUNTER — Telehealth: Payer: Self-pay | Admitting: Internal Medicine

## 2015-04-27 NOTE — Telephone Encounter (Signed)
Called [facility] pharmacy to verify that patient prescription for Voltaren was filled. Pharmacist confirmed that the prescription was filled and the patient picked up the medication on 04/03/15. Medication has refills.

## 2015-06-22 ENCOUNTER — Encounter: Payer: Self-pay | Admitting: Internal Medicine

## 2015-06-22 ENCOUNTER — Ambulatory Visit (INDEPENDENT_AMBULATORY_CARE_PROVIDER_SITE_OTHER): Payer: Medicare Other | Admitting: Cardiology

## 2015-06-22 ENCOUNTER — Ambulatory Visit: Payer: Medicare Other | Attending: Internal Medicine | Admitting: Internal Medicine

## 2015-06-22 ENCOUNTER — Encounter: Payer: Self-pay | Admitting: Cardiology

## 2015-06-22 VITALS — BP 134/79 | HR 62 | Temp 97.8°F | Resp 15 | Ht 64.0 in | Wt 199.8 lb

## 2015-06-22 VITALS — BP 121/61 | HR 65 | Ht 70.0 in | Wt 200.0 lb

## 2015-06-22 DIAGNOSIS — Z79899 Other long term (current) drug therapy: Secondary | ICD-10-CM | POA: Insufficient documentation

## 2015-06-22 DIAGNOSIS — Z7901 Long term (current) use of anticoagulants: Secondary | ICD-10-CM | POA: Insufficient documentation

## 2015-06-22 DIAGNOSIS — E785 Hyperlipidemia, unspecified: Secondary | ICD-10-CM | POA: Insufficient documentation

## 2015-06-22 DIAGNOSIS — M1990 Unspecified osteoarthritis, unspecified site: Secondary | ICD-10-CM | POA: Diagnosis not present

## 2015-06-22 DIAGNOSIS — I10 Essential (primary) hypertension: Secondary | ICD-10-CM | POA: Diagnosis present

## 2015-06-22 DIAGNOSIS — R011 Cardiac murmur, unspecified: Secondary | ICD-10-CM

## 2015-06-22 DIAGNOSIS — I4892 Unspecified atrial flutter: Secondary | ICD-10-CM

## 2015-06-22 DIAGNOSIS — I340 Nonrheumatic mitral (valve) insufficiency: Secondary | ICD-10-CM

## 2015-06-22 DIAGNOSIS — I4891 Unspecified atrial fibrillation: Secondary | ICD-10-CM | POA: Insufficient documentation

## 2015-06-22 LAB — CBC WITH DIFFERENTIAL/PLATELET
BASOS ABS: 0 10*3/uL (ref 0.0–0.1)
Basophils Relative: 0.6 % (ref 0.0–3.0)
Eosinophils Absolute: 0.1 10*3/uL (ref 0.0–0.7)
Eosinophils Relative: 1.8 % (ref 0.0–5.0)
HCT: 34.7 % — ABNORMAL LOW (ref 36.0–46.0)
Hemoglobin: 11.5 g/dL — ABNORMAL LOW (ref 12.0–15.0)
LYMPHS ABS: 1.6 10*3/uL (ref 0.7–4.0)
Lymphocytes Relative: 30.4 % (ref 12.0–46.0)
MCHC: 33 g/dL (ref 30.0–36.0)
MCV: 86.6 fl (ref 78.0–100.0)
MONO ABS: 0.4 10*3/uL (ref 0.1–1.0)
Monocytes Relative: 8.2 % (ref 3.0–12.0)
NEUTROS PCT: 59 % (ref 43.0–77.0)
Neutro Abs: 3.2 10*3/uL (ref 1.4–7.7)
Platelets: 216 10*3/uL (ref 150.0–400.0)
RBC: 4.01 Mil/uL (ref 3.87–5.11)
RDW: 14.6 % (ref 11.5–15.5)
WBC: 5.4 10*3/uL (ref 4.0–10.5)

## 2015-06-22 LAB — BASIC METABOLIC PANEL
BUN: 18 mg/dL (ref 6–23)
CALCIUM: 10.1 mg/dL (ref 8.4–10.5)
CO2: 30 mEq/L (ref 19–32)
Chloride: 101 mEq/L (ref 96–112)
Creatinine, Ser: 1.04 mg/dL (ref 0.40–1.20)
GFR: 54.53 mL/min — AB (ref >60.00)
GLUCOSE: 129 mg/dL — AB (ref 70–99)
Potassium: 4.4 mEq/L (ref 3.5–5.1)
Sodium: 137 mEq/L (ref 135–145)

## 2015-06-22 MED ORDER — RIVAROXABAN 20 MG PO TABS: ORAL_TABLET | ORAL | Status: DC

## 2015-06-22 MED ORDER — ROSUVASTATIN CALCIUM 10 MG PO TABS: 10.0000 mg | ORAL_TABLET | Freq: Every day | ORAL | Status: DC

## 2015-06-22 MED ORDER — AMLODIPINE-OLMESARTAN 5-20 MG PO TABS: 1.0000 | ORAL_TABLET | Freq: Every day | ORAL | Status: DC

## 2015-06-22 MED ORDER — GABAPENTIN 100 MG PO CAPS: 100.0000 mg | ORAL_CAPSULE | Freq: Two times a day (BID) | ORAL | Status: DC

## 2015-06-22 NOTE — Patient Instructions (Signed)
Medication Instructions:  Your physician recommends that you continue on your current medications as directed. Please refer to the Current Medication list given to you today.   Labwork: TODAY: BMET, CBC  Testing/Procedures: None  Follow-Up: Your physician wants you to follow-up in: 6 months with Dr. Turner. You will receive a reminder letter in the mail two months in advance. If you don't receive a letter, please call our office to schedule the follow-up appointment.   Any Other Special Instructions Will Be Listed Below (If Applicable).  

## 2015-06-22 NOTE — Progress Notes (Signed)
Cardiology Office Note   Date:  06/22/2015   ID:  Anna [name], DOB 20-Jun-1936, MRN [id number]  PCP:  Ambrose Finland, NP    Chief Complaint  Patient presents with  . Follow-up    atrial fibrillation      History of Present Illness: Anna [name] is a 79 y.o. female with a history of HTN, atrial fibrillation on Xarelto due to her CHADS2VASC score of 3, mild to moderate MR by echo.   She underwent DCCV to NSR but then reverted back to atrial flutter it was decided that we would pursue rate control. She now presents for followup. She denies any chest pain but occasionally has some DOE when going up stairs. She occasionally has some LE edema.  She denies any palpitations, dizziness or syncope.    Past Medical History  Diagnosis Date  . Arrhythmia   . Atrial fibrillation   . Hyperlipidemia   . Hypertension   . Osteoarthritis   . Overweight(278.02)     Past Surgical History  Procedure Laterality Date  . Cardioversion N/A 03/24/2014    Procedure: CARDIOVERSION;  Surgeon: Quintella Reichert, MD;  Location: [facility];  Service: Cardiovascular;  Laterality: N/A;     Current Outpatient Prescriptions  Medication Sig Dispense Refill  . amLODipine-olmesartan (AZOR) 5-20 MG per tablet Take 1 tablet by mouth daily. 30 tablet 3  . diclofenac sodium (VOLTAREN) 1 % GEL Apply 4 g topically 4 (four) times daily. Apply to legs 400 g 3  . docusate sodium (COLACE) 100 MG capsule Take 100 mg by mouth daily.     Marland Kitchen gabapentin (NEURONTIN) 100 MG capsule Take 1 capsule (100 mg total) by mouth 2 (two) times daily. 60 capsule 2  . Multiple Vitamins-Minerals (CENTRUM SILVER PO) Take 1 tablet by mouth daily.     . Omega-3 Fatty Acids (FISH OIL CONCENTRATE PO) Take 1 capsule by mouth daily.     . rivaroxaban (XARELTO) 20 MG TABS tablet TAKE 1 TABLET BY MOUTH EVERY DAY WITH SUPPER 30 tablet 3  . rosuvastatin (CRESTOR) 10 MG tablet Take 1 tablet (10 mg total) by mouth at bedtime.  30 tablet 2   No current facility-administered medications for this visit.    Allergies:   Review of patient's allergies indicates no known allergies.    Social History:  The patient  reports that she has never smoked. She does not have any smokeless tobacco history on file. She reports that she does not drink alcohol or use illicit drugs.   Family History:  The patient's family history includes Alzheimer's disease in her mother.    ROS:  Please see the history of present illness.   Otherwise, review of systems are positive for none.   All other systems are reviewed and negative.    PHYSICAL EXAM: VS:  BP 98/62 mmHg  Pulse 65  Ht 5\' 10"  (1.778 m)  Wt 200 lb (90.719 kg)  BMI 28.70 kg/m2  SpO2 97% , BMI Body mass index is 28.7 kg/(m^2). GEN: Well nourished, well developed, in no acute distress HEENT: normal Neck: no JVD, carotid bruits, or masses Cardiac: RRR; no rubs, or gallops,no edema.  2/6 SM at RUSB to LLSB Respiratory:  clear to auscultation bilaterally, normal work of breathing GI: soft, nontender, nondistended, + BS MS: no deformity or atrophy Skin: warm and dry, no rash Neuro:  Strength and sensation are  intact Psych: euthymic mood, full affect   EKG:  EKG is ordered today. The ekg ordered today demonstrates atrial flutter with variable AV block and anterior infarct   Recent Labs: 12/16/2014: BUN 24*; Creatinine, Ser 1.10; Hemoglobin 11.9*; Platelets 205.0; Potassium 4.7; Sodium 136    Lipid Panel No results found for: CHOL, TRIG, HDL, CHOLHDL, VLDL, LDLCALC, LDLDIRECT    Wt Readings from Last 3 Encounters:  06/22/15 200 lb (90.719 kg)  06/22/15 199 lb 12.8 oz (90.629 kg)  03/10/15 195 lb (88.451 kg)      ASSESSMENT AND PLAN:  1. Chronic atrial flutter - rate controlled. - continue Xarelto - Check NOAC panel today 2. HTN - controlled - Continue Azor to 5-20mg  daily  3. Mild to moderate MR - repeat 2D echo to assess for progression  Current  medicines are reviewed at length with the patient today.  The patient does not have concerns regarding medicines.  The following changes have been made:  no change  Labs/ tests ordered today: See above Assessment and Plan No orders of the defined types were placed in this encounter.     Disposition:   FU with me in 6 months  Signed, Quintella Reichert, MD  06/22/2015 3:21 PM    [facility] [address] [place], [place], [place]  [address] Phone: [phone]; Fax: [phone]

## 2015-06-22 NOTE — Progress Notes (Signed)
Patient here for follow up on her hypertension and cholesterol Patient states she has already eaten today

## 2015-06-22 NOTE — Progress Notes (Signed)
Patient ID: Anna [name], female   DOB: 06-Sep-1936, 79 y.o.   MRN: [id number] Subjective:  Anna [name] is a 79 y.o. female with hypertension, atrial fibrillation, and HLD. Patient reports that the Voltaren gel has been helping her knee pain but not helping her to walk better. She states that she has had her Zostavax vaccine earlier this year from [employer]. She reports that she has a Cardiology appointment later today. She is on chronic anticoagulation.  Current Outpatient Prescriptions  Medication Sig Dispense Refill  . amLODipine-olmesartan (AZOR) 5-20 MG per tablet Take 1 tablet by mouth daily.    Marland Kitchen gabapentin (NEURONTIN) 100 MG capsule 100 mg 2 (two) times daily.     . Multiple Vitamins-Minerals (CENTRUM SILVER PO) Take 1 tablet by mouth daily.     . Omega-3 Fatty Acids (FISH OIL CONCENTRATE PO) Take 1 capsule by mouth daily.     . rivaroxaban (XARELTO) 20 MG TABS tablet TAKE 1 TABLET BY MOUTH EVERY DAY WITH SUPPER 30 tablet 3  . rosuvastatin (CRESTOR) 10 MG tablet Take 10 mg by mouth at bedtime.     . diclofenac sodium (VOLTAREN) 1 % GEL Apply 4 g topically 4 (four) times daily. Apply to legs 400 g 3  . docusate sodium (COLACE) 100 MG capsule Take 100 mg by mouth daily.      No current facility-administered medications for this visit.    Review of Systems  Eyes: Negative for blurred vision.  Respiratory: Negative for cough and shortness of breath.   Cardiovascular: Positive for leg swelling. Negative for chest pain and claudication.  Neurological: Negative for dizziness and headaches.  All other systems reviewed and are negative.   Objective:  BP 134/79 mmHg  Pulse 62  Temp(Src) 97.8 F (36.6 C)  Resp 15  Ht  (1.626 m)  Wt 199 lb 12.8 oz (90.629 kg)  BMI 34.28 kg/m2  SpO2 100%  Appearance alert, well appearing, and in no distress, oriented to person, place, and time and overweight. Physical Exam  Constitutional: She is oriented to person, place, and time.   Cardiovascular: Normal rate.   No murmur heard. Irregular rhythm--A.fib  Pulmonary/Chest: Effort normal and breath sounds normal.  Musculoskeletal: She exhibits edema (BLE).  Neurological: She is alert and oriented to person, place, and time.  Skin: Skin is warm and dry.     Lab review: patient will need to return fasting for lipid panel.   Assessment:   Prabhjot was seen today for follow-up.  Diagnoses and all orders for this visit:  Essential hypertension -     Refilled amLODipine-olmesartan (AZOR) 5-20 MG per tablet; Take 1 tablet by mouth daily. -     Basic Metabolic Panel; Future Patient blood pressure is stable and may continue on current medication.  Education on diet, exercise, and modifiable risk factors discussed. Will obtain appropriate labs as needed. Will follow up in 3-6 months.   Atrial fibrillation, unspecified -   Refilled rivaroxaban (XARELTO) 20 MG TABS tablet; TAKE 1 TABLET BY MOUTH EVERY DAY WITH SUPPER Patient requested to discuss signs and symptoms of a stroke and heart attack.   HLD (hyperlipidemia) -   Refilled rosuvastatin (CRESTOR) 10 MG tablet; Take 1 tablet (10 mg total) by mouth at bedtime. -     Lipid panel; Future Education provided on proper lifestyle changes in order to lower cholesterol. Patient advised to maintain healthy weight and to keep total fat intake at 25-35% of total calories and carbohydrates 50-60%  of total daily calories. Went over specific foods that can increase cholesterol such as breads, pasta, rice, potatoes, oils, etc. Explained how high cholesterol places patient at risk for heart disease. Patient will continue Crestor but she will need to return in 1 week for a recheck.   Arthritis -     gabapentin (NEURONTIN) 100 MG capsule; Take 1 capsule (100 mg total) by mouth 2 (two) times daily. Patient reports that last provider had her on Celebrex but d/'ed once she began Xarelto. She has been taking gabapentin for chronic arthritis pain  since that time.    Return in about 1 week (around 06/29/2015) for Lab Visit-fasting and 3 mo PCP. Future labs for BMET and lipid placed. Patient refused Dexa Scan.    Ambrose Finland, NP 06/22/2015 11:52 AM

## 2015-06-23 ENCOUNTER — Other Ambulatory Visit: Payer: Self-pay | Admitting: Internal Medicine

## 2015-06-29 ENCOUNTER — Ambulatory Visit: Payer: Medicare Other | Attending: Internal Medicine

## 2015-06-29 DIAGNOSIS — I10 Essential (primary) hypertension: Secondary | ICD-10-CM

## 2015-06-29 DIAGNOSIS — E785 Hyperlipidemia, unspecified: Secondary | ICD-10-CM

## 2015-06-29 LAB — BASIC METABOLIC PANEL
BUN: 19 mg/dL (ref 7–25)
CHLORIDE: 104 mmol/L (ref 98–110)
CO2: 26 mmol/L (ref 20–31)
Calcium: 9.7 mg/dL (ref 8.6–10.4)
Creat: 1.11 mg/dL — ABNORMAL HIGH (ref 0.60–0.93)
Glucose, Bld: 105 mg/dL — ABNORMAL HIGH (ref 65–99)
Potassium: 5.1 mmol/L (ref 3.5–5.3)
Sodium: 139 mmol/L (ref 135–146)

## 2015-06-29 LAB — LIPID PANEL
Cholesterol: 98 mg/dL — ABNORMAL LOW (ref 125–200)
HDL: 51 mg/dL (ref >=46)
LDL CALC: 33 mg/dL (ref <130)
TRIGLYCERIDES: 72 mg/dL (ref <150)
Total CHOL/HDL Ratio: 1.9 Ratio (ref <=5.0)
VLDL: 14 mg/dL (ref <30)

## 2015-07-04 ENCOUNTER — Telehealth: Payer: Self-pay

## 2015-07-04 NOTE — Telephone Encounter (Signed)
-----   Message from Ambrose Finland, NP sent at 07/03/2015 10:33 AM EDT ----- Patient's kidney function is slightly elevated. We'll recheck in 2-3 months on follow-up visit. Cholesterol looks great keep up the good work

## 2015-07-04 NOTE — Telephone Encounter (Signed)
Patient not available Left message on voice mail to return our call 

## 2015-07-25 ENCOUNTER — Other Ambulatory Visit: Payer: Self-pay | Admitting: Internal Medicine

## 2015-07-25 DIAGNOSIS — M1990 Unspecified osteoarthritis, unspecified site: Secondary | ICD-10-CM

## 2015-07-31 NOTE — Telephone Encounter (Signed)
Patient called stating that she was giving the wrong med on diclofenac sodium (VOLTAREN) 1 % GEL, Patient stated that when she went to pick up her med she only got 1 tube and it is usually 4.  Patient stated her pharmacy needs a new prescription so that she can be able to get the other 3 tubes. Please f/u with pt.

## 2015-08-02 NOTE — Telephone Encounter (Signed)
Patient called to request a med refill for diclofenac sodium (VOLTAREN) 1 % GEL. Patient stated that she usually gets 4 tubes and only received it yet.

## 2015-08-03 ENCOUNTER — Other Ambulatory Visit: Payer: Self-pay | Admitting: Internal Medicine

## 2015-08-07 NOTE — Telephone Encounter (Signed)
Anna [name] patients insurance agent called requesting a med refill on diclofenac sodium (VOLTAREN) 1 % GEL, insurance needs prior authorization fort this medications. Patient uses 4 tubes a month. Her insurance is [employer]. Patients insurance also stated that patient is eligible for a 90 day supply on gabapentin (NEURONTIN) 100 MG capsule  And patient only got a 30 day supply, patient needs a new prescription sent to the pharmacy. Please f/u.  [phone]

## 2015-08-10 ENCOUNTER — Telehealth: Payer: Self-pay | Admitting: Internal Medicine

## 2015-08-10 NOTE — Telephone Encounter (Signed)
Pt. Called stating she needs for PCP to call her insurance [employer] to let them know she needs 4 tubes a month of diclofenac sodium (VOLTAREN) 1 % GEL. Pt. Case worker stated pt. Insurance has already approved the medication. Please f/u.  [facility]- [phone]

## 2015-08-14 ENCOUNTER — Other Ambulatory Visit: Payer: Self-pay | Admitting: Internal Medicine

## 2015-08-19 ENCOUNTER — Other Ambulatory Visit: Payer: Self-pay | Admitting: Internal Medicine

## 2015-09-04 ENCOUNTER — Other Ambulatory Visit: Payer: Self-pay | Admitting: Internal Medicine

## 2015-09-06 ENCOUNTER — Other Ambulatory Visit: Payer: Self-pay

## 2015-09-06 DIAGNOSIS — M170 Bilateral primary osteoarthritis of knee: Secondary | ICD-10-CM

## 2015-09-06 MED ORDER — DICLOFENAC SODIUM 1 % TD GEL: 4.0000 g | Freq: Four times a day (QID) | TRANSDERMAL | Status: DC

## 2015-09-11 ENCOUNTER — Telehealth: Payer: Self-pay | Admitting: Internal Medicine

## 2015-09-11 NOTE — Telephone Encounter (Signed)
Patient is calling because she states that her pharmacy informed her that we changed her prescription QTY for Gabapentin from 90 to 60 and she is supposed to take three a day. I informed patient that the last Rx we have states 90 and to double-check with her pharmacy; however, she is adamant that the pharmacy will only give her 61. Can we check on this for her? Thank you, Anna [name], ASA

## 2015-09-12 NOTE — Telephone Encounter (Signed)
Returned patient phone call Patient not available Unable to leave voice mail No voice mail at home number

## 2015-09-26 ENCOUNTER — Other Ambulatory Visit: Payer: Self-pay | Admitting: Internal Medicine

## 2015-09-28 ENCOUNTER — Encounter: Payer: Self-pay | Admitting: Cardiology

## 2015-09-29 ENCOUNTER — Encounter: Payer: Self-pay | Admitting: Cardiology

## 2015-10-03 ENCOUNTER — Other Ambulatory Visit: Payer: Self-pay

## 2015-10-03 ENCOUNTER — Telehealth: Payer: Self-pay

## 2015-10-03 DIAGNOSIS — M170 Bilateral primary osteoarthritis of knee: Secondary | ICD-10-CM

## 2015-10-03 NOTE — Telephone Encounter (Addendum)
CMA called patient, patient verified name and DOB. CMA called patient to verify that she have tried other NSAID prior to me faxing over prior authorization for the Diclofenac sodium gel. Patient stated that she has tried OTC NSAID. Patient states she received her diclofenac gel and is due for a refill. I told patient that i would fax over the prior authorization form to [facility] for further medication refills to make sure that there's no delay in next refill.  I called [facility] to verify if patient had a refill on diclofenac sodium gel, and they stated that she did. No further action needed for prior authorization.

## 2015-10-03 NOTE — Telephone Encounter (Signed)
CMA called patient, patient verified name and DOB. CMA called patient to see if she had refills on her diclofenac sodium gel. In patient chart there's a note stating that the mediction need prior authorization. Patient informed me that she threw the original box away and wasn't sure if she had refills left. I told patient I would call [facility] to check on that for her. Patient states that she was going to [facility] anyway to pickup other medications and she would follow up with the Pharmacy. I called [facility] and the pharmacist stated that patient does have refills remaining.

## 2015-10-19 ENCOUNTER — Ambulatory Visit: Payer: Medicare Other | Admitting: Internal Medicine

## 2015-10-26 ENCOUNTER — Encounter: Payer: Self-pay | Admitting: Internal Medicine

## 2015-10-26 ENCOUNTER — Other Ambulatory Visit: Payer: Self-pay | Admitting: Internal Medicine

## 2015-10-26 ENCOUNTER — Ambulatory Visit: Payer: Medicare Other | Attending: Internal Medicine | Admitting: Internal Medicine

## 2015-10-26 VITALS — BP 146/75 | HR 57 | Temp 97.8°F | Resp 16 | Ht 64.0 in | Wt 195.6 lb

## 2015-10-26 DIAGNOSIS — I340 Nonrheumatic mitral (valve) insufficiency: Secondary | ICD-10-CM | POA: Diagnosis not present

## 2015-10-26 DIAGNOSIS — E663 Overweight: Secondary | ICD-10-CM | POA: Insufficient documentation

## 2015-10-26 DIAGNOSIS — Z6833 Body mass index (BMI) 33.0-33.9, adult: Secondary | ICD-10-CM | POA: Diagnosis not present

## 2015-10-26 DIAGNOSIS — E785 Hyperlipidemia, unspecified: Secondary | ICD-10-CM | POA: Diagnosis not present

## 2015-10-26 DIAGNOSIS — R011 Cardiac murmur, unspecified: Secondary | ICD-10-CM | POA: Insufficient documentation

## 2015-10-26 DIAGNOSIS — I482 Chronic atrial fibrillation, unspecified: Secondary | ICD-10-CM

## 2015-10-26 DIAGNOSIS — I10 Essential (primary) hypertension: Secondary | ICD-10-CM | POA: Diagnosis not present

## 2015-10-26 DIAGNOSIS — M170 Bilateral primary osteoarthritis of knee: Secondary | ICD-10-CM | POA: Insufficient documentation

## 2015-10-26 LAB — COMPLETE METABOLIC PANEL WITH GFR
ALK PHOS: 56 U/L (ref 33–130)
ALT: 11 U/L (ref 6–29)
AST: 24 U/L (ref 10–35)
Albumin: 4 g/dL (ref 3.6–5.1)
BILIRUBIN TOTAL: 0.6 mg/dL (ref 0.2–1.2)
BUN: 15 mg/dL (ref 7–25)
CALCIUM: 9.5 mg/dL (ref 8.6–10.4)
CO2: 25 mmol/L (ref 20–31)
Chloride: 102 mmol/L (ref 98–110)
Creat: 0.98 mg/dL — ABNORMAL HIGH (ref 0.60–0.93)
GFR, EST NON AFRICAN AMERICAN: 56 mL/min — AB (ref >=60)
GFR, Est African American: 64 mL/min (ref >=60)
GLUCOSE: 104 mg/dL — AB (ref 65–99)
Potassium: 5.3 mmol/L (ref 3.5–5.3)
SODIUM: 139 mmol/L (ref 135–146)
TOTAL PROTEIN: 6.9 g/dL (ref 6.1–8.1)

## 2015-10-26 MED ORDER — DICLOFENAC SODIUM 1 % TD GEL: 4.0000 g | Freq: Four times a day (QID) | TRANSDERMAL | Status: DC

## 2015-10-26 MED ORDER — AMLODIPINE-OLMESARTAN 5-20 MG PO TABS: 1.0000 | ORAL_TABLET | Freq: Every day | ORAL | Status: DC

## 2015-10-26 MED ORDER — GABAPENTIN 100 MG PO CAPS: ORAL_CAPSULE | ORAL | Status: DC

## 2015-10-26 MED ORDER — RIVAROXABAN 20 MG PO TABS: ORAL_TABLET | ORAL | Status: DC

## 2015-10-26 NOTE — Progress Notes (Signed)
Patient ID: Anna [name], female   DOB: September 01, 1936, 79 y.o.   MRN: [id number]  CC: 3 month f/u   HPI: Anna [name] is a 79 y.o. female here today for a follow up visit.  Patient has past medical history of atrial fibrillation, HLD, HTN, mitral regurgitation, heart murmur, and osteoarthritis. Patient reports that she is up to date on her cardiology follow ups and has continued to take her Xarelto without skipped doses. She denies SOB, palpitations, or chest pain.  Patient reports that she lives alone because her son passed away from alcoholism 2 months ago. She does not use her cane when she is at home and sometimes fells unsteady when walking. She denies falls. She has aching and soreness in both knees.   No Known Allergies Past Medical History  Diagnosis Date  . Arrhythmia   . Atrial fibrillation (HCC)   . Hyperlipidemia   . Hypertension   . Osteoarthritis   . Overweight(278.02)    Current Outpatient Prescriptions on File Prior to Visit  Medication Sig Dispense Refill  . AZOR 5-20 MG tablet TAKE 1 TABLET BY MOUTH DAILY 30 tablet 0  . diclofenac sodium (VOLTAREN) 1 % GEL Apply 4 g topically 4 (four) times daily. Apply to legs 400 g 3  . gabapentin (NEURONTIN) 100 MG capsule TAKE 1 CAPSULE(100 MG) BY MOUTH THREE TIMES DAILY 90 capsule 0  . rosuvastatin (CRESTOR) 10 MG tablet Take 1 tablet (10 mg total) by mouth at bedtime. 30 tablet 2  . docusate sodium (COLACE) 100 MG capsule Take 100 mg by mouth daily.     . Multiple Vitamins-Minerals (CENTRUM SILVER PO) Take 1 tablet by mouth daily.     . Omega-3 Fatty Acids (FISH OIL CONCENTRATE PO) Take 1 capsule by mouth daily.      No current facility-administered medications on file prior to visit.   Family History  Problem Relation Age of Onset  . Alzheimer's disease Mother    Social History   Social History  . Marital Status: Divorced    Spouse Name: N/A  . Number of Children: N/A  . Years of Education: N/A   Occupational History   . Not on file.   Social History Main Topics  . Smoking status: Never Smoker   . Smokeless tobacco: Not on file  . Alcohol Use: No  . Drug Use: No  . Sexual Activity: Not on file   Other Topics Concern  . Not on file   Social History Narrative    Review of Systems: Other than what is stated in HPI, all other systems are negative.   Objective:   Filed Vitals:   10/26/15 1136  BP: 146/75  Pulse: 57  Temp: 97.8 F (36.6 C)  Resp: 16    Physical Exam  Constitutional: She is oriented to person, place, and time.  Cardiovascular: Normal rate and regular rhythm.   Murmur heard. Sinus rhythm  Pulmonary/Chest: Effort normal and breath sounds normal.  Musculoskeletal: She exhibits no edema.  Cane for mobility Unsteady when attempting to stand from sitiing  Neurological: She is alert and oriented to person, place, and time.     Lab Results  Component Value Date   WBC 5.4 06/22/2015   HGB 11.5* 06/22/2015   HCT 34.7* 06/22/2015   MCV 86.6 06/22/2015   PLT 216.0 06/22/2015   Lab Results  Component Value Date   CREATININE 1.11* 06/29/2015   BUN 19 06/29/2015   NA 139 06/29/2015   K 5.1  06/29/2015   CL 104 06/29/2015   CO2 26 06/29/2015    No results found for: HGBA1C Lipid Panel     Component Value Date/Time   CHOL 98* 06/29/2015 0945   TRIG 72 06/29/2015 0945   HDL 51 06/29/2015 0945   CHOLHDL 1.9 06/29/2015 0945   VLDL 14 06/29/2015 0945   LDLCALC 33 06/29/2015 0945       Assessment and plan:   Anna [name] was seen today for follow-up.  Diagnoses and all orders for this visit:  Essential hypertension, benign -     amLODipine-olmesartan (AZOR) 5-20 MG tablet; Take 1 tablet by mouth daily. -     COMPLETE METABOLIC PANEL WITH GFR BP at goal for age. DASH diet advised  Chronic atrial fibrillation (HCC) -     rivaroxaban (XARELTO) 20 MG TABS tablet; TAKE 1 TABLET BY MOUTH EVERY DAY WITH SUPPER She is in Sinus rhythm today, continue Xarelto.    Osteoarthritis of both knees, unspecified osteoarthritis type -     diclofenac sodium (VOLTAREN) 1 % GEL; Apply 4 g topically 4 (four) times daily. Apply to legs -     gabapentin (NEURONTIN) 100 MG capsule; TAKE 1 CAPSULE(100 MG) BY MOUTH THREE TIMES DAILY Encouraged use of Cane at all times even when at home.   Return in about 3 months (around 01/24/2016).        Ambrose Finland, NP-C [facility] [phone] 10/26/2015, 11:47 AM

## 2015-10-26 NOTE — Progress Notes (Signed)
Patient here for follow up on her HTN and for a check up Patient requesting refills on her medications as well

## 2015-10-27 ENCOUNTER — Telehealth: Payer: Self-pay

## 2015-10-27 NOTE — Telephone Encounter (Signed)
Spoke with patient and she is aware of her lab results 

## 2015-10-28 ENCOUNTER — Other Ambulatory Visit: Payer: Self-pay | Admitting: Internal Medicine

## 2015-11-24 ENCOUNTER — Other Ambulatory Visit: Payer: Self-pay | Admitting: Internal Medicine

## 2015-12-14 DIAGNOSIS — H04123 Dry eye syndrome of bilateral lacrimal glands: Secondary | ICD-10-CM | POA: Diagnosis not present

## 2015-12-14 DIAGNOSIS — H26493 Other secondary cataract, bilateral: Secondary | ICD-10-CM | POA: Diagnosis not present

## 2015-12-26 NOTE — Progress Notes (Signed)
Cardiology Office Note   Date:  12/27/2015   ID:  Anna [name], DOB 1936/04/21, MRN [id number]  PCP:  Ambrose Finland, NP    Chief Complaint  Patient presents with  . Atrial Flutter  . Hypertension      History of Present Illness: Anna [name] is a 80 y.o. female with a history of HTN, atrial fibrillation on Xarelto due to her CHADS2VASC score of 3, mild to moderate MR by echo. She underwent DCCV to NSR but then reverted back to atrial flutter it was decided that we would pursue rate control. She now presents for followup. She denies any chest pain or DOE. She occasionally has some LE edema in her knees. She denies any palpitations, dizziness or syncope.   Past Medical History  Diagnosis Date  . Arrhythmia   . Atrial fibrillation (HCC)   . Hyperlipidemia   . Hypertension   . Osteoarthritis   . Overweight(278.02)     Past Surgical History  Procedure Laterality Date  . Cardioversion N/A 03/24/2014    Procedure: CARDIOVERSION;  Surgeon: Quintella Reichert, MD;  Location: [facility];  Service: Cardiovascular;  Laterality: N/A;     Current Outpatient Prescriptions  Medication Sig Dispense Refill  . amLODipine-olmesartan (AZOR) 5-20 MG tablet Take 1 tablet by mouth daily. 30 tablet 4  . diclofenac sodium (VOLTAREN) 1 % GEL Apply 4 g topically 4 (four) times daily. Apply to legs 400 g 3  . docusate sodium (COLACE) 100 MG capsule Take 100 mg by mouth daily.     Marland Kitchen gabapentin (NEURONTIN) 100 MG capsule TAKE 1 CAPSULE(100 MG) BY MOUTH THREE TIMES DAILY 90 capsule 3  . Multiple Vitamins-Minerals (CENTRUM SILVER PO) Take 1 tablet by mouth daily.     . Omega-3 Fatty Acids (FISH OIL CONCENTRATE PO) Take 1 capsule by mouth daily.     . rivaroxaban (XARELTO) 20 MG TABS tablet TAKE 1 TABLET BY MOUTH EVERY DAY WITH SUPPER 30 tablet 5  . rosuvastatin (CRESTOR) 10 MG tablet TAKE 1 TABLET(10 MG) BY MOUTH AT BEDTIME 30 tablet 2   No current facility-administered  medications for this visit.    Allergies:   Review of patient's allergies indicates no known allergies.    Social History:  The patient  reports that she has never smoked. She does not have any smokeless tobacco history on file. She reports that she does not drink alcohol or use illicit drugs.   Family History:  The patient's family history includes Alzheimer's disease in her mother.    ROS:  Please see the history of present illness.   Otherwise, review of systems are positive for none.   All other systems are reviewed and negative.    PHYSICAL EXAM: VS:  BP 145/74 mmHg  Pulse 64  Ht  (1.6 m)  Wt 195 lb (88.451 kg)  BMI 34.55 kg/m2 , BMI Body mass index is 34.55 kg/(m^2). GEN: Well nourished, well developed, in no acute distress HEENT: normal Neck: no JVD, carotid bruits, or masses Cardiac: RRR; no rubs, or gallops,no edema .  1/6 SM at apex Respiratory:  clear to auscultation bilaterally, normal work of breathing GI: soft, nontender, nondistended, + BS MS: no deformity or atrophy Skin: warm and dry, no rash Neuro:  Strength and sensation are intact Psych: euthymic mood, full affect   EKG:  EKG is not ordered today.  Recent Labs: 06/22/2015: Hemoglobin 11.5*; Platelets 216.0 10/26/2015: ALT 11; BUN 15; Creat 0.98*; Potassium 5.3; Sodium 139    Lipid Panel    Component Value Date/Time   CHOL 98* 06/29/2015 0945   TRIG 72 06/29/2015 0945   HDL 51 06/29/2015 0945   CHOLHDL 1.9 06/29/2015 0945   VLDL 14 06/29/2015 0945   LDLCALC 33 06/29/2015 0945      Wt Readings from Last 3 Encounters:  12/27/15 195 lb (88.451 kg)  10/26/15 195 lb 9.6 oz (88.724 kg)  06/22/15 200 lb (90.719 kg)      ASSESSMENT AND PLAN:  1. Chronic atrial flutter - rate controlled. - continue Xarelto 2. HTN - controlled - Continue Azor to 5-20mg  daily  3. Mild to moderate MR - repeat 2D echo to assess for progression  Current medicines are reviewed at length with the patient  today.  The patient does not have concerns regarding medicines.  The following changes have been made:  no change  Labs/ tests ordered today: See above Assessment and Plan No orders of the defined types were placed in this encounter.     Disposition:   FU with me in 6 months  Signed, Quintella Reichert, MD  12/27/2015 3:23 PM    [facility] [address] [place], [place], [place]  [address] Phone: [phone]; Fax: [phone]

## 2015-12-27 ENCOUNTER — Ambulatory Visit (INDEPENDENT_AMBULATORY_CARE_PROVIDER_SITE_OTHER): Payer: Medicare Other | Admitting: Cardiology

## 2015-12-27 ENCOUNTER — Encounter: Payer: Self-pay | Admitting: Cardiology

## 2015-12-27 VITALS — BP 145/74 | HR 64 | Ht 63.0 in | Wt 195.0 lb

## 2015-12-27 DIAGNOSIS — I4892 Unspecified atrial flutter: Secondary | ICD-10-CM | POA: Diagnosis not present

## 2015-12-27 DIAGNOSIS — I10 Essential (primary) hypertension: Secondary | ICD-10-CM

## 2015-12-27 DIAGNOSIS — I340 Nonrheumatic mitral (valve) insufficiency: Secondary | ICD-10-CM | POA: Diagnosis not present

## 2015-12-27 NOTE — Patient Instructions (Signed)
Medication Instructions:  Your physician recommends that you continue on your current medications as directed. Please refer to the Current Medication list given to you today.   Labwork: None  Testing/Procedures: Your physician has requested that you have an echocardiogram. Echocardiography is a painless test that uses sound waves to create images of your heart. It provides your doctor with information about the size and shape of your heart and how well your heart's chambers and valves are working. This procedure takes approximately one hour. There are no restrictions for this procedure.  Follow-Up: Your physician wants you to follow-up in: 6 months with Dr. Turner. You will receive a reminder letter in the mail two months in advance. If you don't receive a letter, please call our office to schedule the follow-up appointment.   Any Other Special Instructions Will Be Listed Below (If Applicable).     If you need a refill on your cardiac medications before your next appointment, please call your pharmacy.   

## 2016-01-17 ENCOUNTER — Other Ambulatory Visit (HOSPITAL_COMMUNITY): Payer: Medicare Other

## 2016-01-25 ENCOUNTER — Other Ambulatory Visit: Payer: Self-pay | Admitting: Internal Medicine

## 2016-01-30 ENCOUNTER — Ambulatory Visit: Payer: Medicare Other | Attending: Internal Medicine | Admitting: Internal Medicine

## 2016-01-30 ENCOUNTER — Encounter: Payer: Self-pay | Admitting: Internal Medicine

## 2016-01-30 VITALS — BP 139/78 | HR 60 | Temp 97.9°F | Resp 16 | Ht 64.0 in | Wt 196.0 lb

## 2016-01-30 DIAGNOSIS — I482 Chronic atrial fibrillation, unspecified: Secondary | ICD-10-CM

## 2016-01-30 DIAGNOSIS — I10 Essential (primary) hypertension: Secondary | ICD-10-CM | POA: Insufficient documentation

## 2016-01-30 DIAGNOSIS — M170 Bilateral primary osteoarthritis of knee: Secondary | ICD-10-CM | POA: Diagnosis not present

## 2016-01-30 DIAGNOSIS — Z79899 Other long term (current) drug therapy: Secondary | ICD-10-CM | POA: Diagnosis not present

## 2016-01-30 MED ORDER — DICLOFENAC SODIUM 1 % TD GEL: 4.0000 g | Freq: Four times a day (QID) | TRANSDERMAL | Status: DC

## 2016-01-30 MED ORDER — GABAPENTIN 100 MG PO CAPS: ORAL_CAPSULE | ORAL | Status: DC

## 2016-01-30 NOTE — Progress Notes (Signed)
Patient's here for 3 month f/up HTN. Patient reports feeling good.  Patient denies any pain today,

## 2016-01-30 NOTE — Progress Notes (Signed)
Patient ID: Anna [name], female   DOB: Aug 27, 1936, 80 y.o.   MRN: [id number]  CC: follow up   HPI: Anna [name] is a 80 y.o. female here today for a follow up visit. Patient has past medical history of atrial fibrillation, HLD, HTN, mitral regurgitation, heart murmur, and osteoarthritis. Patient reports that she is up to date on her cardiology follow ups and has continued to take her Xarelto and anti-hypertensive medications without skipped doses. She denies SOB, palpitations, or chest pain.    No Known Allergies Past Medical History  Diagnosis Date  . Arrhythmia   . Atrial fibrillation (HCC)   . Hyperlipidemia   . Hypertension   . Osteoarthritis   . Overweight(278.02)    Current Outpatient Prescriptions on File Prior to Visit  Medication Sig Dispense Refill  . amLODipine-olmesartan (AZOR) 5-20 MG tablet Take 1 tablet by mouth daily. 30 tablet 4  . diclofenac sodium (VOLTAREN) 1 % GEL Apply 4 g topically 4 (four) times daily. Apply to legs 400 g 3  . docusate sodium (COLACE) 100 MG capsule Take 100 mg by mouth daily.     Marland Kitchen gabapentin (NEURONTIN) 100 MG capsule TAKE 1 CAPSULE(100 MG) BY MOUTH THREE TIMES DAILY 90 capsule 3  . Multiple Vitamins-Minerals (CENTRUM SILVER PO) Take 1 tablet by mouth daily.     . Omega-3 Fatty Acids (FISH OIL CONCENTRATE PO) Take 1 capsule by mouth daily.     . rivaroxaban (XARELTO) 20 MG TABS tablet TAKE 1 TABLET BY MOUTH EVERY DAY WITH SUPPER 30 tablet 5  . rosuvastatin (CRESTOR) 10 MG tablet TAKE 1 TABLET(10 MG) BY MOUTH AT BEDTIME 30 tablet 2   No current facility-administered medications on file prior to visit.   Family History  Problem Relation Age of Onset  . Alzheimer's disease Mother    Social History   Social History  . Marital Status: Divorced    Spouse Name: N/A  . Number of Children: N/A  . Years of Education: N/A   Occupational History  . Not on file.   Social History Main Topics  . Smoking status: Never Smoker   . Smokeless  tobacco: Not on file  . Alcohol Use: No  . Drug Use: No  . Sexual Activity: Not on file   Other Topics Concern  . Not on file   Social History Narrative    Review of Systems  Respiratory: Negative for shortness of breath.   Cardiovascular: Negative for chest pain, palpitations and leg swelling.  Musculoskeletal: Positive for joint pain (bilateral knees).       Ambulates with Cane  Neurological: Negative for dizziness.  All other systems reviewed and are negative.     Objective:   Filed Vitals:   01/30/16 1431  BP: 139/78  Pulse: 60  Temp: 97.9 F (36.6 C)  Resp: 16    Physical Exam  Constitutional: She is oriented to person, place, and time.  Cardiovascular: Normal rate.   Murmur heard. A.fib, rate controlled  Pulmonary/Chest: Breath sounds normal.  Musculoskeletal: She exhibits tenderness (knees).  Neurological: She is alert and oriented to person, place, and time.  Skin: Skin is warm and dry.  Psychiatric: She has a normal mood and affect.     Lab Results  Component Value Date   WBC 5.4 06/22/2015   HGB 11.5* 06/22/2015   HCT 34.7* 06/22/2015   MCV 86.6 06/22/2015   PLT 216.0 06/22/2015   Lab Results  Component Value Date   CREATININE 0.98* 10/26/2015  BUN 15 10/26/2015   NA 139 10/26/2015   K 5.3 10/26/2015   CL 102 10/26/2015   CO2 25 10/26/2015    No results found for: HGBA1C Lipid Panel     Component Value Date/Time   CHOL 98* 06/29/2015 0945   TRIG 72 06/29/2015 0945   HDL 51 06/29/2015 0945   CHOLHDL 1.9 06/29/2015 0945   VLDL 14 06/29/2015 0945   LDLCALC 33 06/29/2015 0945       Assessment and plan:   Britton was seen today for follow-up and hypertension.  Diagnoses and all orders for this visit:  Essential hypertension -     Basic Metabolic Panel Patient blood pressure is stable and may continue on current medication.  Education on diet, exercise, and modifiable risk factors discussed. Will obtain appropriate labs as  needed. Will follow up in 3-6 months.   Chronic atrial fibrillation (HCC) Rate controlled and stable. Continue Xarelto.  Osteoarthritis of both knees, unspecified osteoarthritis type -     gabapentin (NEURONTIN) 100 MG capsule; TAKE 1 CAPSULE(100 MG) BY MOUTH THREE TIMES DAILY -     diclofenac sodium (VOLTAREN) 1 % GEL; Apply 4 g topically 4 (four) times daily. Apply to legs I have offered her a Orthopedic referral but she decline. I am not sure if patient is a good candidate for joint injections since she is on Xarelto. She will call back if she changes her mind.  Return in about 3 months (around 04/30/2016) for Hypertension.       Ambrose Finland, NP-C [facility] [phone] 01/30/2016, 2:52 PM

## 2016-01-31 LAB — BASIC METABOLIC PANEL
BUN: 12 mg/dL (ref 7–25)
CALCIUM: 9.3 mg/dL (ref 8.6–10.4)
CO2: 27 mmol/L (ref 20–31)
Chloride: 102 mmol/L (ref 98–110)
Creat: 0.88 mg/dL (ref 0.60–0.93)
GLUCOSE: 84 mg/dL (ref 65–99)
POTASSIUM: 4.4 mmol/L (ref 3.5–5.3)
SODIUM: 139 mmol/L (ref 135–146)

## 2016-02-05 ENCOUNTER — Telehealth: Payer: Self-pay

## 2016-02-05 NOTE — Telephone Encounter (Signed)
CMA called patient, patient verified name and DOB. Patient was given lab results verbalized he understood with no further questions. 

## 2016-02-05 NOTE — Telephone Encounter (Signed)
-----   Message from Ambrose Finland, NP sent at 02/02/2016 10:22 AM EDT ----- Labs normal

## 2016-02-12 ENCOUNTER — Other Ambulatory Visit (HOSPITAL_COMMUNITY): Payer: Self-pay | Admitting: *Deleted

## 2016-02-12 ENCOUNTER — Ambulatory Visit (HOSPITAL_COMMUNITY): Payer: Medicare Other | Attending: Cardiology

## 2016-02-12 ENCOUNTER — Other Ambulatory Visit: Payer: Self-pay

## 2016-02-12 DIAGNOSIS — I059 Rheumatic mitral valve disease, unspecified: Secondary | ICD-10-CM | POA: Diagnosis present

## 2016-02-12 DIAGNOSIS — I4891 Unspecified atrial fibrillation: Secondary | ICD-10-CM | POA: Diagnosis not present

## 2016-02-12 DIAGNOSIS — E785 Hyperlipidemia, unspecified: Secondary | ICD-10-CM | POA: Insufficient documentation

## 2016-02-12 DIAGNOSIS — Z6833 Body mass index (BMI) 33.0-33.9, adult: Secondary | ICD-10-CM | POA: Diagnosis not present

## 2016-02-12 DIAGNOSIS — I119 Hypertensive heart disease without heart failure: Secondary | ICD-10-CM | POA: Insufficient documentation

## 2016-02-12 DIAGNOSIS — I340 Nonrheumatic mitral (valve) insufficiency: Secondary | ICD-10-CM | POA: Diagnosis not present

## 2016-02-12 DIAGNOSIS — E669 Obesity, unspecified: Secondary | ICD-10-CM | POA: Diagnosis not present

## 2016-02-12 DIAGNOSIS — I3489 Other nonrheumatic mitral valve disorders: Secondary | ICD-10-CM

## 2016-02-12 MED ORDER — PERFLUTREN LIPID MICROSPHERE
1.0000 mL | INTRAVENOUS | Status: AC | PRN
  Administered 2016-02-12: 2 mL via INTRAVENOUS

## 2016-02-13 ENCOUNTER — Encounter: Payer: Self-pay | Admitting: Cardiology

## 2016-03-15 ENCOUNTER — Other Ambulatory Visit: Payer: Self-pay | Admitting: Internal Medicine

## 2016-04-29 ENCOUNTER — Other Ambulatory Visit: Payer: Self-pay | Admitting: Internal Medicine

## 2016-05-12 ENCOUNTER — Other Ambulatory Visit: Payer: Self-pay | Admitting: Internal Medicine

## 2016-06-05 ENCOUNTER — Encounter: Payer: Self-pay | Admitting: Cardiology

## 2016-06-11 ENCOUNTER — Other Ambulatory Visit: Payer: Self-pay | Admitting: Internal Medicine

## 2016-06-11 DIAGNOSIS — M170 Bilateral primary osteoarthritis of knee: Secondary | ICD-10-CM

## 2016-06-13 DIAGNOSIS — H04123 Dry eye syndrome of bilateral lacrimal glands: Secondary | ICD-10-CM | POA: Diagnosis not present

## 2016-06-13 DIAGNOSIS — H1840 Unspecified corneal degeneration: Secondary | ICD-10-CM | POA: Diagnosis not present

## 2016-06-13 DIAGNOSIS — H26493 Other secondary cataract, bilateral: Secondary | ICD-10-CM | POA: Diagnosis not present

## 2016-06-19 ENCOUNTER — Ambulatory Visit (INDEPENDENT_AMBULATORY_CARE_PROVIDER_SITE_OTHER): Payer: Medicare Other | Admitting: Cardiology

## 2016-06-19 ENCOUNTER — Encounter: Payer: Self-pay | Admitting: Cardiology

## 2016-06-19 VITALS — BP 142/82 | HR 65 | Ht 63.0 in | Wt 195.1 lb

## 2016-06-19 DIAGNOSIS — I4892 Unspecified atrial flutter: Secondary | ICD-10-CM

## 2016-06-19 DIAGNOSIS — I340 Nonrheumatic mitral (valve) insufficiency: Secondary | ICD-10-CM

## 2016-06-19 DIAGNOSIS — I10 Essential (primary) hypertension: Secondary | ICD-10-CM | POA: Diagnosis not present

## 2016-06-19 LAB — BASIC METABOLIC PANEL
BUN: 15 mg/dL (ref 7–25)
CHLORIDE: 102 mmol/L (ref 98–110)
CO2: 24 mmol/L (ref 20–31)
Calcium: 9.7 mg/dL (ref 8.6–10.4)
Creat: 1.04 mg/dL — ABNORMAL HIGH (ref 0.60–0.93)
Glucose, Bld: 82 mg/dL (ref 65–99)
POTASSIUM: 4.3 mmol/L (ref 3.5–5.3)
Sodium: 137 mmol/L (ref 135–146)

## 2016-06-19 LAB — CBC WITH DIFFERENTIAL/PLATELET
BASOS ABS: 63 {cells}/uL (ref 0–200)
Basophils Relative: 1 %
EOS ABS: 63 {cells}/uL (ref 15–500)
Eosinophils Relative: 1 %
HEMATOCRIT: 36.5 % (ref 35.0–45.0)
HEMOGLOBIN: 11.8 g/dL (ref 11.7–15.5)
Lymphocytes Relative: 36 %
Lymphs Abs: 2268 cells/uL (ref 850–3900)
MCH: 28 pg (ref 27.0–33.0)
MCHC: 32.3 g/dL (ref 32.0–36.0)
MCV: 86.7 fL (ref 80.0–100.0)
MONO ABS: 378 {cells}/uL (ref 200–950)
MPV: 9.7 fL (ref 7.5–12.5)
Monocytes Relative: 6 %
NEUTROS ABS: 3528 {cells}/uL (ref 1500–7800)
Neutrophils Relative %: 56 %
Platelets: 228 10*3/uL (ref 140–400)
RBC: 4.21 MIL/uL (ref 3.80–5.10)
RDW: 14.4 % (ref 11.0–15.0)
WBC: 6.3 10*3/uL (ref 3.8–10.8)

## 2016-06-19 NOTE — Progress Notes (Signed)
Cardiology Office Note    Date:  06/19/2016   ID:  Anna [name], DOB Jul 03, 1936, MRN [id number]  PCP:  Ambrose FinlandValerie A Keck, NP  Cardiologist:  Armanda Magicraci Ashely Goosby, MD   Chief Complaint  Patient presents with  . Hypertension  . Atrial Fibrillation    History of Present Illness:  Anna [name] is a 80 y.o. female with a history of HTN, atrial fibrillation on Xarelto due to her CHADS2VASC score of 3, mild to moderate MR by echo. She underwent DCCV to NSR but then reverted back to atrial flutter it was decided that we would pursue rate control. She now presents for followup. She denies any chest pain or DOE. She denies any PND or orthopnea.  She occasionally has some LE edema in her knees. She denies any palpitations, dizziness or syncope.    Past Medical History:  Diagnosis Date  . Chronic atrial fibrillation (HCC)   . Hyperlipidemia   . Hypertension   . Osteoarthritis   . Overweight(278.02)     Past Surgical History:  Procedure Laterality Date  . CARDIOVERSION N/A 03/24/2014   Procedure: CARDIOVERSION;  Surgeon: Quintella Reichertraci R Malvin Morrish, MD;  Location: [facility];  Service: Cardiovascular;  Laterality: N/A;    Current Medications: Outpatient Medications Prior to Visit  Medication Sig Dispense Refill  . amLODipine-olmesartan (AZOR) 5-20 MG tablet TAKE 1 TABLET BY MOUTH DAILY 90 tablet 0  . diclofenac sodium (VOLTAREN) 1 % GEL Apply 4 g topically 4 (four) times daily. Apply to legs 400 g 3  . docusate sodium (COLACE) 100 MG capsule Take 100 mg by mouth daily.     Marland Kitchen. gabapentin (NEURONTIN) 100 MG capsule TAKE 1 CAPSULE(100 MG) BY MOUTH THREE TIMES DAILY 90 capsule 0  . Multiple Vitamins-Minerals (CENTRUM SILVER PO) Take 1 tablet by mouth daily.     . Omega-3 Fatty Acids (FISH OIL CONCENTRATE PO) Take 1 capsule by mouth daily.     . rivaroxaban (XARELTO) 20 MG TABS tablet Take 1 tablet (20 mg total) by mouth daily with supper. MUST have office visit 30 tablet 2  . rosuvastatin (CRESTOR)  10 MG tablet TAKE 1 TABLET(10 MG) BY MOUTH AT BEDTIME 30 tablet 2   No facility-administered medications prior to visit.      Allergies:   Review of patient's allergies indicates no known allergies.   Social History   Social History  . Marital status: Divorced    Spouse name: N/A  . Number of children: N/A  . Years of education: N/A   Social History Main Topics  . Smoking status: Never Smoker  . Smokeless tobacco: Never Used  . Alcohol use No  . Drug use: No  . Sexual activity: Not Asked   Other Topics Concern  . None   Social History Narrative  . None     Family History:  The patient's family history includes Alzheimer's disease in her mother.   ROS:   Please see the history of present illness.    Review of Systems  Musculoskeletal: Positive for joint pain.  Neurological: Positive for loss of balance.   All other systems reviewed and are negative.   PHYSICAL EXAM:   VS:  BP (!) 142/82   Pulse 65   Ht 5\' 3"  (1.6 m)   Wt 195 lb 1.9 oz (88.5 kg)   BMI 34.56 kg/m    GEN: Well nourished, well developed, in no acute distress  HEENT: normal  Neck: no JVD, carotid bruits, or masses Cardiac:  RRR; no murmurs, rubs, or gallops,no edema.  Intact distal pulses bilaterally.  Respiratory:  clear to auscultation bilaterally, normal work of breathing GI: soft, nontender, nondistended, + BS MS: no deformity or atrophy  Skin: warm and dry, no rash Neuro:  Alert and Oriented x 3, Strength and sensation are intact Psych: euthymic mood, full affect  Wt Readings from Last 3 Encounters:  06/19/16 195 lb 1.9 oz (88.5 kg)  01/30/16 196 lb (88.9 kg)  12/27/15 195 lb (88.5 kg)      Studies/Labs Reviewed:   EKG:  EKG is ordered today.  The ekg ordered today demonstrates atrial flutter with anterior infarct and PVC  Recent Labs: 06/22/2015: Hemoglobin 11.5; Platelets 216.0 10/26/2015: ALT 11 01/30/2016: BUN 12; Creat 0.88; Potassium 4.4; Sodium 139   Lipid Panel      Component Value Date/Time   CHOL 98 (L) 06/29/2015 0945   TRIG 72 06/29/2015 0945   HDL 51 06/29/2015 0945   CHOLHDL 1.9 06/29/2015 0945   VLDL 14 06/29/2015 0945   LDLCALC 33 06/29/2015 0945    Additional studies/ records that were reviewed today include:  none    ASSESSMENT:    1. Atrial flutter, unspecified type (HCC)   2. Essential hypertension, benign   3. Mitral regurgitation      PLAN:  In order of problems listed above:  1. Chronic atrial flutter rate controlled. She will continue on Xarelto for CHAS2VASC score of 3.  Check BMET or CBC. 2. HTN - Bp well controlled.  Continue Axor. 3. Mild MR - stable by recent echo    Medication Adjustments/Labs and Tests Ordered: Current medicines are reviewed at length with the patient today.  Concerns regarding medicines are outlined above.  Medication changes, Labs and Tests ordered today are listed in the Patient Instructions below.  There are no Patient Instructions on file for this visit.   Signed, Armanda Magic, MD  06/19/2016 2:58 PM    [facility] [address] [place], [place], [place]  [address] Phone: [phone]; Fax: [phone]

## 2016-06-19 NOTE — Patient Instructions (Signed)

## 2016-06-21 ENCOUNTER — Other Ambulatory Visit: Payer: Self-pay | Admitting: Internal Medicine

## 2016-06-21 DIAGNOSIS — M170 Bilateral primary osteoarthritis of knee: Secondary | ICD-10-CM

## 2016-06-25 ENCOUNTER — Encounter: Payer: Self-pay | Admitting: Internal Medicine

## 2016-06-25 ENCOUNTER — Ambulatory Visit: Payer: Medicare Other | Attending: Internal Medicine | Admitting: Internal Medicine

## 2016-06-25 VITALS — BP 147/73 | HR 55 | Temp 97.6°F | Resp 16 | Wt 197.6 lb

## 2016-06-25 DIAGNOSIS — Z1329 Encounter for screening for other suspected endocrine disorder: Secondary | ICD-10-CM

## 2016-06-25 DIAGNOSIS — I482 Chronic atrial fibrillation, unspecified: Secondary | ICD-10-CM

## 2016-06-25 DIAGNOSIS — Z79899 Other long term (current) drug therapy: Secondary | ICD-10-CM | POA: Diagnosis not present

## 2016-06-25 DIAGNOSIS — M170 Bilateral primary osteoarthritis of knee: Secondary | ICD-10-CM | POA: Diagnosis not present

## 2016-06-25 DIAGNOSIS — Z1321 Encounter for screening for nutritional disorder: Secondary | ICD-10-CM

## 2016-06-25 DIAGNOSIS — Z23 Encounter for immunization: Secondary | ICD-10-CM | POA: Diagnosis not present

## 2016-06-25 DIAGNOSIS — I10 Essential (primary) hypertension: Secondary | ICD-10-CM | POA: Diagnosis not present

## 2016-06-25 LAB — TSH: TSH: 3.49 mIU/L

## 2016-06-25 MED ORDER — GABAPENTIN 100 MG PO CAPS: 100.0000 mg | ORAL_CAPSULE | Freq: Three times a day (TID) | ORAL | 2 refills | Status: DC

## 2016-06-25 MED ORDER — AMLODIPINE-OLMESARTAN 5-20 MG PO TABS: 1.0000 | ORAL_TABLET | Freq: Every day | ORAL | 2 refills | Status: DC

## 2016-06-25 NOTE — Progress Notes (Signed)
Pt is in the office today for establish care and medication refill Pt states she is not in any pain today Pt states she takes medication without difficulty

## 2016-06-25 NOTE — Patient Instructions (Addendum)
Influenza Virus Vaccine injection (Fluarix) What is this medicine? INFLUENZA VIRUS VACCINE (in floo EN zuh VAHY ruhs vak SEEN) helps to reduce the risk of getting influenza also known as the flu. This medicine may be used for other purposes; ask your health care provider or pharmacist if you have questions. What should I tell my health care provider before I take this medicine? They need to know if you have any of these conditions: -bleeding disorder like hemophilia -fever or infection -Guillain-Barre syndrome or other neurological problems -immune system problems -infection with the human immunodeficiency virus (HIV) or AIDS -low blood platelet counts -multiple sclerosis -an unusual or allergic reaction to influenza virus vaccine, eggs, chicken proteins, latex, gentamicin, other medicines, foods, dyes or preservatives -pregnant or trying to get pregnant -breast-feeding How should I use this medicine? This vaccine is for injection into a muscle. It is given by a health care professional. A copy of Vaccine Information Statements will be given before each vaccination. Read this sheet carefully each time. The sheet may change frequently. Talk to your pediatrician regarding the use of this medicine in children. Special care may be needed. Overdosage: If you think you have taken too much of this medicine contact a poison control center or emergency room at once. NOTE: This medicine is only for you. Do not share this medicine with others. What if I miss a dose? This does not apply. What may interact with this medicine? -chemotherapy or radiation therapy -medicines that lower your immune system like etanercept, anakinra, infliximab, and adalimumab -medicines that treat or prevent blood clots like warfarin -phenytoin -steroid medicines like prednisone or cortisone -theophylline -vaccines This list may not describe all possible interactions. Give your health care provider a list of all the  medicines, herbs, non-prescription drugs, or dietary supplements you use. Also tell them if you smoke, drink alcohol, or use illegal drugs. Some items may interact with your medicine. What should I watch for while using this medicine? Report any side effects that do not go away within 3 days to your doctor or health care professional. Call your health care provider if any unusual symptoms occur within 6 weeks of receiving this vaccine. You may still catch the flu, but the illness is not usually as bad. You cannot get the flu from the vaccine. The vaccine will not protect against colds or other illnesses that may cause fever. The vaccine is needed every year. What side effects may I notice from receiving this medicine? Side effects that you should report to your doctor or health care professional as soon as possible: -allergic reactions like skin rash, itching or hives, swelling of the face, lips, or tongue Side effects that usually do not require medical attention (report to your doctor or health care professional if they continue or are bothersome): -fever -headache -muscle aches and pains -pain, tenderness, redness, or swelling at site where injected -weak or tired This list may not describe all possible side effects. Call your doctor for medical advice about side effects. You may report side effects to FDA at 1-800-FDA-1088. Where should I keep my medicine? This vaccine is only given in a clinic, pharmacy, doctor's office, or other health care setting and will not be stored at home. NOTE: This sheet is a summary. It may not cover all possible information. If you have questions about this medicine, talk to your doctor, pharmacist, or health care provider.    2016, Elsevier/Gold Standard. (2008-05-11 09:30:40)   - DASH Eating Plan DASH stands  for "Dietary Approaches to Stop Hypertension." The DASH eating plan is a healthy eating plan that has been shown to reduce high blood pressure  (hypertension). Additional health benefits may include reducing the risk of type 2 diabetes mellitus, heart disease, and stroke. The DASH eating plan may also help with weight loss. WHAT DO I NEED TO KNOW ABOUT THE DASH EATING PLAN? For the DASH eating plan, you will follow these general guidelines:  Choose foods with a percent daily value for sodium of less than 5% (as listed on the food label).  Use salt-free seasonings or herbs instead of table salt or sea salt.  Check with your health care provider or pharmacist before using salt substitutes.  Eat lower-sodium products, often labeled as "lower sodium" or "no salt added."  Eat fresh foods.  Eat more vegetables, fruits, and low-fat dairy products.  Choose whole grains. Look for the word "whole" as the first word in the ingredient list.  Choose fish and skinless chicken or [place] more often than red meat. Limit fish, poultry, and meat to 6 oz (170 g) each day.  Limit sweets, desserts, sugars, and sugary drinks.  Choose heart-healthy fats.  Limit cheese to 1 oz (28 g) per day.  Eat more home-cooked food and less restaurant, buffet, and fast food.  Limit fried foods.  Cook foods using methods other than frying.  Limit canned vegetables. If you do use them, rinse them well to decrease the sodium.  When eating at a restaurant, ask that your food be prepared with less salt, or no salt if possible. WHAT FOODS CAN I EAT? Seek help from a dietitian for individual calorie needs. Grains Whole grain or whole wheat bread. Brown rice. Whole grain or whole wheat pasta. Quinoa, bulgur, and whole grain cereals. Low-sodium cereals. Corn or whole wheat flour tortillas. Whole grain cornbread. Whole grain crackers. Low-sodium crackers. Vegetables Fresh or frozen vegetables (raw, steamed, roasted, or grilled). Low-sodium or reduced-sodium tomato and vegetable juices. Low-sodium or reduced-sodium tomato sauce and paste. Low-sodium or  reduced-sodium canned vegetables.  Fruits All fresh, canned (in natural juice), or frozen fruits. Meat and Other Protein Products Ground beef (85% or leaner), grass-fed beef, or beef trimmed of fat. Skinless chicken or [place]. Ground chicken or [place]. Pork trimmed of fat. All fish and seafood. Eggs. Dried beans, peas, or lentils. Unsalted nuts and seeds. Unsalted canned beans. Dairy Low-fat dairy products, such as skim or 1% milk, 2% or reduced-fat cheeses, low-fat ricotta or cottage cheese, or plain low-fat yogurt. Low-sodium or reduced-sodium cheeses. Fats and Oils Tub margarines without trans fats. Light or reduced-fat mayonnaise and salad dressings (reduced sodium). Avocado. Safflower, olive, or canola oils. Natural peanut or almond butter. Other Unsalted popcorn and pretzels. The items listed above may not be a complete list of recommended foods or beverages. Contact your dietitian for more options. WHAT FOODS ARE NOT RECOMMENDED? Grains White bread. White pasta. White rice. Refined cornbread. Bagels and croissants. Crackers that contain trans fat. Vegetables Creamed or fried vegetables. Vegetables in a cheese sauce. Regular canned vegetables. Regular canned tomato sauce and paste. Regular tomato and vegetable juices. Fruits Dried fruits. Canned fruit in light or heavy syrup. Fruit juice. Meat and Other Protein Products Fatty cuts of meat. Ribs, chicken wings, bacon, sausage, bologna, salami, chitterlings, fatback, hot dogs, bratwurst, and packaged luncheon meats. Salted nuts and seeds. Canned beans with salt. Dairy Whole or 2% milk, cream, half-and-half, and cream cheese. Whole-fat or sweetened yogurt. Full-fat cheeses or blue cheese.  Nondairy creamers and whipped toppings. Processed cheese, cheese spreads, or cheese curds. Condiments Onion and garlic salt, seasoned salt, table salt, and sea salt. Canned and packaged gravies. Worcestershire sauce. Tartar sauce. Barbecue sauce. Teriyaki  sauce. Soy sauce, including reduced sodium. Steak sauce. Fish sauce. Oyster sauce. Cocktail sauce. Horseradish. Ketchup and mustard. Meat flavorings and tenderizers. Bouillon cubes. Hot sauce. Tabasco sauce. Marinades. Taco seasonings. Relishes. Fats and Oils Butter, stick margarine, lard, shortening, ghee, and bacon fat. Coconut, palm kernel, or palm oils. Regular salad dressings. Other Pickles and olives. Salted popcorn and pretzels. The items listed above may not be a complete list of foods and beverages to avoid. Contact your dietitian for more information. WHERE CAN I FIND MORE INFORMATION? National Heart, Lung, and Blood Institute: [employer]   This information is not intended to replace advice given to you by your health care provider. Make sure you discuss any questions you have with your health care provider.   Document Released: 10/03/2011 Document Revised: 11/04/2014 Document Reviewed: 08/18/2013 Elsevier Interactive Patient Education [employer]

## 2016-06-25 NOTE — Progress Notes (Signed)
Anna [name], is a 80 y.o. female  BTY:[id number]  HFS:[id number]  DOB - Feb 17, 1936  CC:  Chief Complaint  Patient presents with  . Establish Care  . Medication Refill       HPI: Anna [name] is a 80 y.o. female here today to establish medical care for htn, hld, chronic atrial flutter on Xarelto, and mild-mod mitral regurg on echo.  Pt c/o of bilat knee pains, makes it very hard for her to walk for long periods >15 mins. Walks w/ cane.  Valotorin gel and neurontin helps somewhat. Not interested in surgery.  Patient has No headache, No chest pain, No abdominal pain - No Nausea, No new weakness tingling or numbness, No Cough - SOB.  Does c/o of being cold all the time. Denies constipation.   Review of Systems: Per hPI, o/w all systems reviewed and negative.   No Known Allergies Past Medical History:  Diagnosis Date  . Chronic atrial fibrillation (HCC)   . Hyperlipidemia   . Hypertension   . Osteoarthritis   . Overweight(278.02)    Current Outpatient Prescriptions on File Prior to Visit  Medication Sig Dispense Refill  . diclofenac sodium (VOLTAREN) 1 % GEL Apply 4 g topically 4 (four) times daily. Apply to legs 400 g 3  . docusate sodium (COLACE) 100 MG capsule Take 100 mg by mouth daily.     . Multiple Vitamins-Minerals (CENTRUM SILVER PO) Take 1 tablet by mouth daily.     . Omega-3 Fatty Acids (FISH OIL CONCENTRATE PO) Take 1 capsule by mouth daily.     . rivaroxaban (XARELTO) 20 MG TABS tablet Take 1 tablet (20 mg total) by mouth daily with supper. MUST have office visit 30 tablet 2  . rosuvastatin (CRESTOR) 10 MG tablet TAKE 1 TABLET(10 MG) BY MOUTH AT BEDTIME 30 tablet 2   No current facility-administered medications on file prior to visit.    Family History  Problem Relation Age of Onset  . Alzheimer's disease Mother    Social History   Social History  . Marital status: Divorced    Spouse name: N/A  . Number of children: N/A  . Years of education: N/A     Occupational History  . Not on file.   Social History Main Topics  . Smoking status: Never Smoker  . Smokeless tobacco: Never Used  . Alcohol use No  . Drug use: No  . Sexual activity: Not on file   Other Topics Concern  . Not on file   Social History Narrative  . No narrative on file    Objective:   Vitals:   06/25/16 1413  BP: (!) 147/73  Pulse: (!) 55  Resp: 16  Temp: 97.6 F (36.4 C)    Filed Weights   06/25/16 1413  Weight: 197 lb 9.6 oz (89.6 kg)    BP Readings from Last 3 Encounters:  06/25/16 (!) 147/73  06/19/16 (!) 142/82  02/12/16 (!) 152/72    Physical Exam: Constitutional: Patient appears well-developed and well-nourished. No distress. AAOx3, elderly appearing. Walking with a cane. Gait is slow. HENT: Normocephalic, atraumatic, External right and left ear normal. Oropharynx is clear and moist.  Eyes: Conjunctivae and EOM are normal. PERRL, no scleral icterus. Neck: Normal ROM. Neck supple. No JVD. CVS: RRR, S1/S2 +, no murmurs, no gallops, no carotid bruit.  Pulmonary: Effort and breath sounds normal, no stridor, rhonchi, wheezes, rales.  Abdominal: Soft. BS +, obese, no distension, tenderness, rebound or guarding.  Musculoskeletal: Normal  range of motion. No edema and no tenderness.  No edema noted on bilat knees. LE: bilat/ no c/c/e, pulses 2+ bilateral. Neuro: Alert.  muscle tone coordination wnl. No cranial nerve deficit grossly. Skin: Skin is warm and dry. No rash noted. Not diaphoretic. No erythema. No pallor. Psychiatric: Normal mood and affect. Behavior, judgment, thought content normal.  Lab Results  Component Value Date   WBC 6.3 06/19/2016   HGB 11.8 06/19/2016   HCT 36.5 06/19/2016   MCV 86.7 06/19/2016   PLT 228 06/19/2016   Lab Results  Component Value Date   CREATININE 1.04 (H) 06/19/2016   BUN 15 06/19/2016   NA 137 06/19/2016   K 4.3 06/19/2016   CL 102 06/19/2016   CO2 24 06/19/2016    No results found for:  HGBA1C Lipid Panel     Component Value Date/Time   CHOL 98 (L) 06/29/2015 0945   TRIG 72 06/29/2015 0945   HDL 51 06/29/2015 0945   CHOLHDL 1.9 06/29/2015 0945   VLDL 14 06/29/2015 0945   LDLCALC 33 06/29/2015 0945       Depression screen PHQ 2/9 06/25/2016 01/30/2016 03/10/2015  Decreased Interest 2 0 0  Down, Depressed, Hopeless 0 0 -  PHQ - 2 Score 2 0 0  Altered sleeping 0 - -  Tired, decreased energy 1 - -  Change in appetite 1 - -  Feeling bad or failure about yourself  0 - -  Trouble concentrating 0 - -  Moving slowly or fidgety/restless 0 - -  Suicidal thoughts 0 - -  PHQ-9 Score 4 - -    Assessment and plan:   1. Essential hypertension, benign controlled, continue Azor. Goal sbp <150/90 Dash diet recd  2. Osteoarthritis of both knees, unspecified osteoarthritis type -  Renewed valtorin gel - gabapentin (NEURONTIN) 100 MG capsule; Take 1 capsule (100 mg total) by mouth 3 (three) times daily.  Dispense: 90 capsule; Refill: 2 - rx for  bilat knee brace or knee wraps for more stability provided  3. Needs flu shot - Flu Vaccine QUAD 36+ mos PF IM (Fluarix & Fluzone Quad PF)  4. Chronic atrial fibrillation (HCC) On xarelto, CHADSvac score 3.  5. Thyroid disorder screening, w/ c/o of cold intolerance - TSH  6. Encounter for vitamin deficiency screening - Vitamin D, 25-hydroxy   Return in about 3 months (around 09/25/2016).  The patient was given clear instructions to go to ER or return to medical center if symptoms don't improve, worsen or new problems develop. The patient verbalized understanding. The patient was told to call to get lab results if they haven't heard anything in the next week.    This note has been created with Education officer, [employer]Dragon speech recognition software and smart phrase technology. Any transcriptional errors are unintentional.   Pete Glatterawn T Arriel Victor, MD, MBA/MHA [facility] [place], [place] [phone]   06/25/2016,  3:36 PM

## 2016-06-26 LAB — VITAMIN D 25 HYDROXY (VIT D DEFICIENCY, FRACTURES): Vit D, 25-Hydroxy: 43 ng/mL (ref 30–100)

## 2016-07-03 ENCOUNTER — Encounter: Payer: Self-pay | Admitting: Internal Medicine

## 2016-07-03 DIAGNOSIS — E2839 Other primary ovarian failure: Secondary | ICD-10-CM | POA: Insufficient documentation

## 2016-07-04 ENCOUNTER — Telehealth: Payer: Self-pay

## 2016-07-04 DIAGNOSIS — M170 Bilateral primary osteoarthritis of knee: Secondary | ICD-10-CM | POA: Diagnosis not present

## 2016-07-04 NOTE — Telephone Encounter (Signed)
Patient advised of lab results per Dr. Julien Nordmann.  No further questions at this time. Pollyann Kennedy, RN, BSN

## 2016-07-04 NOTE — Telephone Encounter (Signed)
-----   Message from Pete Glatter, MD sent at 06/28/2016  7:53 AM EDT ----- Please call. Pt vit d is nml. She should continue to take at least OTC 1200mg  of calcium /day and OTC vit d 800 IU /day for bone health. Her thyroid fn is nml. thx

## 2016-07-17 ENCOUNTER — Other Ambulatory Visit: Payer: Self-pay | Admitting: Internal Medicine

## 2016-07-17 ENCOUNTER — Other Ambulatory Visit: Payer: Self-pay | Admitting: Cardiology

## 2016-07-17 DIAGNOSIS — M170 Bilateral primary osteoarthritis of knee: Secondary | ICD-10-CM

## 2016-07-27 ENCOUNTER — Other Ambulatory Visit: Payer: Self-pay | Admitting: Internal Medicine

## 2016-08-15 ENCOUNTER — Other Ambulatory Visit: Payer: Self-pay | Admitting: Internal Medicine

## 2016-09-03 ENCOUNTER — Encounter: Payer: Self-pay | Admitting: Internal Medicine

## 2016-09-03 ENCOUNTER — Ambulatory Visit: Payer: Medicare Other | Attending: Internal Medicine | Admitting: Internal Medicine

## 2016-09-03 VITALS — BP 130/82 | HR 65 | Temp 97.9°F | Resp 16 | Wt 199.6 lb

## 2016-09-03 DIAGNOSIS — Z7901 Long term (current) use of anticoagulants: Secondary | ICD-10-CM

## 2016-09-03 DIAGNOSIS — I482 Chronic atrial fibrillation: Secondary | ICD-10-CM | POA: Diagnosis not present

## 2016-09-03 DIAGNOSIS — Z780 Asymptomatic menopausal state: Secondary | ICD-10-CM | POA: Insufficient documentation

## 2016-09-03 DIAGNOSIS — E785 Hyperlipidemia, unspecified: Secondary | ICD-10-CM | POA: Insufficient documentation

## 2016-09-03 DIAGNOSIS — I10 Essential (primary) hypertension: Secondary | ICD-10-CM | POA: Insufficient documentation

## 2016-09-03 DIAGNOSIS — I4892 Unspecified atrial flutter: Secondary | ICD-10-CM | POA: Insufficient documentation

## 2016-09-03 DIAGNOSIS — M170 Bilateral primary osteoarthritis of knee: Secondary | ICD-10-CM | POA: Diagnosis not present

## 2016-09-03 MED ORDER — GABAPENTIN 100 MG PO CAPS: 100.0000 mg | ORAL_CAPSULE | Freq: Three times a day (TID) | ORAL | 2 refills | Status: DC

## 2016-09-03 MED ORDER — RIVAROXABAN 20 MG PO TABS: 20.0000 mg | ORAL_TABLET | Freq: Every day | ORAL | 3 refills | Status: DC

## 2016-09-03 MED ORDER — DICLOFENAC SODIUM 1 % TD GEL: 4.0000 g | Freq: Four times a day (QID) | TRANSDERMAL | 3 refills | Status: DC

## 2016-09-03 MED ORDER — ROSUVASTATIN CALCIUM 10 MG PO TABS: 10.0000 mg | ORAL_TABLET | Freq: Every day | ORAL | 3 refills | Status: DC

## 2016-09-03 MED ORDER — AMLODIPINE-OLMESARTAN 5-20 MG PO TABS: 1.0000 | ORAL_TABLET | Freq: Every day | ORAL | 2 refills | Status: DC

## 2016-09-03 NOTE — Patient Instructions (Addendum)
Calcium '1200mg'$  and vit D 800 IU daily   Menopause is a normal process in which your reproductive ability comes to an end. This process happens gradually over a span of months to years, usually between the ages of 66 and 49. Menopause is complete when you have missed 12 consecutive menstrual periods. It is important to talk with your health care provider about some of the most common conditions that affect postmenopausal women, such as heart disease, cancer, and bone loss (osteoporosis). Adopting a healthy lifestyle and getting preventive care can help to promote your health and wellness. Those actions can also lower your chances of developing some of these common conditions. WHAT SHOULD I KNOW ABOUT MENOPAUSE? During menopause, you may experience a number of symptoms, such as:  Moderate-to-severe hot flashes.  Night sweats.  Decrease in sex drive.  Mood swings.  Headaches.  Tiredness.  Irritability.  Memory problems.  Insomnia. Choosing to treat or not to treat menopausal changes is an individual decision that you make with your health care provider. WHAT SHOULD I KNOW ABOUT HORMONE REPLACEMENT THERAPY AND SUPPLEMENTS? Hormone therapy products are effective for treating symptoms that are associated with menopause, such as hot flashes and night sweats. Hormone replacement carries certain risks, especially as you become older. If you are thinking about using estrogen or estrogen with progestin treatments, discuss the benefits and risks with your health care provider. WHAT SHOULD I KNOW ABOUT HEART DISEASE AND STROKE? Heart disease, heart attack, and stroke become more likely as you age. This may be due, in part, to the hormonal changes that your body experiences during menopause. These can affect how your body processes dietary fats, triglycerides, and cholesterol. Heart attack and stroke are both medical emergencies. There are many things that you can do to help prevent heart disease and  stroke:  Have your blood pressure checked at least every 1-2 years. High blood pressure causes heart disease and increases the risk of stroke.  If you are 72-53 years old, ask your health care provider if you should take aspirin to prevent a heart attack or a stroke.  Do not use any tobacco products, including cigarettes, chewing tobacco, or electronic cigarettes. If you need help quitting, ask your health care provider.  It is important to eat a healthy diet and maintain a healthy weight.  Be sure to include plenty of vegetables, fruits, low-fat dairy products, and lean protein.  Avoid eating foods that are high in solid fats, added sugars, or salt (sodium).  Get regular exercise. This is one of the most important things that you can do for your health.  Try to exercise for at least 150 minutes each week. The type of exercise that you do should increase your heart rate and make you sweat. This is known as moderate-intensity exercise.  Try to do strengthening exercises at least twice each week. Do these in addition to the moderate-intensity exercise.  Know your numbers.Ask your health care provider to check your cholesterol and your blood glucose. Continue to have your blood tested as directed by your health care provider. WHAT SHOULD I KNOW ABOUT CANCER SCREENING? There are several types of cancer. Take the following steps to reduce your risk and to catch any cancer development as early as possible. Breast Cancer  Practice breast self-awareness.  This means understanding how your breasts normally appear and feel.  It also means doing regular breast self-exams. Let your health care provider know about any changes, no matter how small.  If  you are 10 or older, have a clinician do a breast exam (clinical breast exam or CBE) every year. Depending on your age, family history, and medical history, it may be recommended that you also have a yearly breast X-ray (mammogram).  If you have a  family history of breast cancer, talk with your health care provider about genetic screening.  If you are at high risk for breast cancer, talk with your health care provider about having an MRI and a mammogram every year.  Breast cancer (BRCA) gene test is recommended for women who have family members with BRCA-related cancers. Results of the assessment will determine the need for genetic counseling and BRCA1 and for BRCA2 testing. BRCA-related cancers include these types:  Breast. This occurs in males or females.  Ovarian.  Tubal. This may also be called fallopian tube cancer.  Cancer of the abdominal or pelvic lining (peritoneal cancer).  Prostate.  Pancreatic. Cervical, Uterine, and Ovarian Cancer Your health care provider may recommend that you be screened regularly for cancer of the pelvic organs. These include your ovaries, uterus, and vagina. This screening involves a pelvic exam, which includes checking for microscopic changes to the surface of your cervix (Pap test).  For women ages 21-65, health care providers may recommend a pelvic exam and a Pap test every three years. For women ages 41-65, they may recommend the Pap test and pelvic exam, combined with testing for human papilloma virus (HPV), every five years. Some types of HPV increase your risk of cervical cancer. Testing for HPV may also be done on women of any age who have unclear Pap test results.  Other health care providers may not recommend any screening for nonpregnant women who are considered low risk for pelvic cancer and have no symptoms. Ask your health care provider if a screening pelvic exam is right for you.  If you have had past treatment for cervical cancer or a condition that could lead to cancer, you need Pap tests and screening for cancer for at least 20 years after your treatment. If Pap tests have been discontinued for you, your risk factors (such as having a new sexual partner) need to be reassessed to  determine if you should start having screenings again. Some women have medical problems that increase the chance of getting cervical cancer. In these cases, your health care provider may recommend that you have screening and Pap tests more often.  If you have a family history of uterine cancer or ovarian cancer, talk with your health care provider about genetic screening.  If you have vaginal bleeding after reaching menopause, tell your health care provider.  There are currently no reliable tests available to screen for ovarian cancer. Lung Cancer Lung cancer screening is recommended for adults 61-57 years old who are at high risk for lung cancer because of a history of smoking. A yearly low-dose CT scan of the lungs is recommended if you:  Currently smoke.  Have a history of at least 30 pack-years of smoking and you currently smoke or have quit within the past 15 years. A pack-year is smoking an average of one pack of cigarettes per day for one year. Yearly screening should:  Continue until it has been 15 years since you quit.  Stop if you develop a health problem that would prevent you from having lung cancer treatment. Colorectal Cancer  This type of cancer can be detected and can often be prevented.  Routine colorectal cancer screening usually begins at age  28 and continues through age 12.  If you have risk factors for colon cancer, your health care provider may recommend that you be screened at an earlier age.  If you have a family history of colorectal cancer, talk with your health care provider about genetic screening.  Your health care provider may also recommend using home test kits to check for hidden blood in your stool.  A small camera at the end of a tube can be used to examine your colon directly (sigmoidoscopy or colonoscopy). This is done to check for the earliest forms of colorectal cancer.  Direct examination of the colon should be repeated every 5-10 years until age  38. However, if early forms of precancerous polyps or small growths are found or if you have a family history or genetic risk for colorectal cancer, you may need to be screened more often. Skin Cancer  Check your skin from head to toe regularly.  Monitor any moles. Be sure to tell your health care provider:  About any new moles or changes in moles, especially if there is a change in a mole's shape or color.  If you have a mole that is larger than the size of a pencil eraser.  If any of your family members has a history of skin cancer, especially at a young age, talk with your health care provider about genetic screening.  Always use sunscreen. Apply sunscreen liberally and repeatedly throughout the day.  Whenever you are outside, protect yourself by wearing long sleeves, pants, a wide-brimmed hat, and sunglasses. WHAT SHOULD I KNOW ABOUT OSTEOPOROSIS? Osteoporosis is a condition in which bone destruction happens more quickly than new bone creation. After menopause, you may be at an increased risk for osteoporosis. To help prevent osteoporosis or the bone fractures that can happen because of osteoporosis, the following is recommended:  If you are 47-44 years old, get at least 1,000 mg of calcium and at least 600 mg of vitamin D per day.  If you are older than age 68 but younger than age 91, get at least 1,200 mg of calcium and at least 600 mg of vitamin D per day.  If you are older than age 73, get at least 1,200 mg of calcium and at least 800 mg of vitamin D per day. Smoking and excessive alcohol intake increase the risk of osteoporosis. Eat foods that are rich in calcium and vitamin D, and do weight-bearing exercises several times each week as directed by your health care provider. WHAT SHOULD I KNOW ABOUT HOW MENOPAUSE AFFECTS [employer]? Depression may occur at any age, but it is more common as you become older. Common symptoms of depression include:  Low or sad mood.  Changes  in sleep patterns.  Changes in appetite or eating patterns.  Feeling an overall lack of motivation or enjoyment of activities that you previously enjoyed.  Frequent crying spells. Talk with your health care provider if you think that you are experiencing depression. WHAT SHOULD I KNOW ABOUT IMMUNIZATIONS? It is important that you get and maintain your immunizations. These include:  Tetanus, diphtheria, and pertussis (Tdap) booster vaccine.  Influenza every year before the flu season begins.  Pneumonia vaccine.  Shingles vaccine. Your health care provider may also recommend other immunizations.   This information is not intended to replace advice given to you by your health care provider. Make sure you discuss any questions you have with your health care provider.   Document Released: 12/06/2005 Document Revised:  11/04/2014 Document Reviewed: 06/16/2014 Elsevier Interactive Patient Education [employer].

## 2016-09-03 NOTE — Progress Notes (Signed)
Pt is in the office today for a 3 month follow up  Pt states she is not in any pain Pt states she is taking medication without difficulty 130 82

## 2016-09-03 NOTE — Progress Notes (Signed)
Anna [name], is a 80 y.o. female  BJY:[id number]  [id number]  DOB - Mar 26, 1936  No chief complaint on file.       Subjective:   Anna [name] is a 80 y.o. female here today for a follow up visit, last seen 06/25/16. Pt has since gotten the knee braces and doing very well with it. Ambulating much better now w/ brace.  Denies any falls, stools have been normal.  Denies melena, dark tarry stools, n/v/d.  Has hx of chronic aflutter, on xarelto, htn, mild -mod AR.    Declined dexa scan screening.  Amendable to taking otc calcium and vit d.  Patient has No headache, No chest pain, No abdominal pain - No Nausea, No new weakness tingling or numbness, No Cough - SOB.  Denies orthopnea/pnd/syncope.  No problems updated.  ALLERGIES: No Known Allergies  PAST MEDICAL HISTORY: Past Medical History:  Diagnosis Date  . Chronic atrial fibrillation (HCC)   . Hyperlipidemia   . Hypertension   . Osteoarthritis   . Overweight(278.02)     MEDICATIONS AT HOME: Prior to Admission medications   Medication Sig Start Date End Date Taking? Authorizing Provider  amLODipine-olmesartan (AZOR) 5-20 MG tablet Take 1 tablet by mouth daily. 09/03/16  Yes Pete Glatterawn T Atthew Coutant, MD  diclofenac sodium (VOLTAREN) 1 % GEL Apply 4 g topically 4 (four) times daily. Apply to legs 09/03/16  Yes Pete Glatterawn T Dorcas Melito, MD  docusate sodium (COLACE) 100 MG capsule Take 100 mg by mouth daily.    Yes Historical Provider, MD  gabapentin (NEURONTIN) 100 MG capsule Take 1 capsule (100 mg total) by mouth 3 (three) times daily. 09/03/16  Yes Keevin Panebianco Marland Mcalpine Ezequiel Macauley, MD  Multiple Vitamins-Minerals (CENTRUM SILVER PO) Take 1 tablet by mouth daily.    Yes Historical Provider, MD  Omega-3 Fatty Acids (FISH OIL CONCENTRATE PO) Take 1 capsule by mouth daily.    Yes Historical Provider, MD  rosuvastatin (CRESTOR) 10 MG tablet Take 1 tablet (10 mg total) by mouth daily. 09/03/16  Yes Pete Glatterawn T Dwane Andres, MD  rivaroxaban (XARELTO) 20 MG TABS tablet  Take 1 tablet (20 mg total) by mouth daily with supper. 09/03/16   Pete Glatterawn T Shatona Andujar, MD     Objective:   Vitals:   09/03/16 1438  BP: 130/82  Pulse: 65  Resp: 16  Temp: 97.9 F (36.6 C)  TempSrc: Oral  SpO2: 99%  Weight: 199 lb 9.6 oz (90.5 kg)    Exam General appearance : Awake, alert, not in any distress. Speech Clear. Not toxic looking, pleasant. HEENT: Atraumatic and Normocephalic, pupils equally reactive to light. Neck: supple, no JVD.  Chest:Good air entry bilaterally, no added sounds. CVS: S1 S2 regular, no murmurs/gallups or rubs. Abdomen: Bowel sounds active, Non tender and not distended with no gaurding, rigidity or rebound. Extremities: B/L Lower Ext shows no edema, both legs are warm to touch, wearing her knee brace w/ good results. Neurology: Awake alert, and oriented X 3, CN II-XII grossly intact, Non focal Skin:No Rash  Data Review No results found for: HGBA1C  Dep[facility] 09/03/2016 06/25/2016 01/30/2016 03/10/2015  Decreased Interest 0 2 0 0  Down, Depressed, Hopeless 0 0 0 -  PHQ - 2 Score 0 2 0 0  Altered sleeping - 0 - -  Tired, decreased energy - 1 - -  Change in appetite - 1 - -  Feeling bad or failure about yourself  - 0 - -  Trouble concentrating - 0 - -  Moving slowly or fidgety/restless - 0 - -  Suicidal thoughts - 0 - -  PHQ-9 Score - 4 - -      Assessment & Plan   1. Essential hypertension, benign Well controlled, continue   2. Osteoarthritis of both knees, unspecified osteoarthritis type - conttinue knee brace during day, advised not to wear at night, she understands. - pain reasonably control w/ current regimen - diclofenac sodium (VOLTAREN) 1 % GEL; Apply 4 g topically 4 (four) times daily. Apply to legs  Dispense: 400 g; Refill: 3 - gabapentin (NEURONTIN) 100 MG capsule; Take 1 capsule (100 mg total) by mouth 3 (three) times daily.  Dispense: 90 capsule; Refill: 2  3. Atrial flutter, unspecified type (HCC) - xarelto  continue - rate controlled.  4. Chronic anticoagulation - on xarelto.  5. Postmenopausal Vit d levels normal rcent - pt declined dexa - recd cal 1200mg  /day and vit d 800iu daily.   Patient have been counseled extensively about nutrition and exercise  Return in about 6 months (around 03/03/2017), or if symptoms worsen or fail to improve, for 3- 6 months.  The patient was given clear instructions to go to ER or return to medical center if symptoms don't improve, worsen or new problems develop. The patient verbalized understanding. The patient was told to call to get lab results if they haven't heard anything in the next week.   This note has been created with Education officer, environmental. Any transcriptional errors are unintentional.   Pete Glatter, MD, MBA/MHA [facility] and [facility] [place], [place] [phone]   09/03/2016, 3:30 PM

## 2016-10-06 ENCOUNTER — Other Ambulatory Visit: Payer: Self-pay | Admitting: Internal Medicine

## 2016-10-06 DIAGNOSIS — M170 Bilateral primary osteoarthritis of knee: Secondary | ICD-10-CM

## 2016-10-07 ENCOUNTER — Other Ambulatory Visit: Payer: Self-pay | Admitting: Internal Medicine

## 2016-10-07 DIAGNOSIS — M170 Bilateral primary osteoarthritis of knee: Secondary | ICD-10-CM

## 2016-10-10 ENCOUNTER — Other Ambulatory Visit: Payer: Self-pay | Admitting: Internal Medicine

## 2016-10-10 DIAGNOSIS — M170 Bilateral primary osteoarthritis of knee: Secondary | ICD-10-CM

## 2016-12-04 ENCOUNTER — Ambulatory Visit: Payer: Medicare Other | Admitting: Physician Assistant

## 2016-12-16 DIAGNOSIS — H26493 Other secondary cataract, bilateral: Secondary | ICD-10-CM | POA: Diagnosis not present

## 2016-12-16 DIAGNOSIS — H04123 Dry eye syndrome of bilateral lacrimal glands: Secondary | ICD-10-CM | POA: Diagnosis not present

## 2016-12-16 DIAGNOSIS — H10413 Chronic giant papillary conjunctivitis, bilateral: Secondary | ICD-10-CM | POA: Diagnosis not present

## 2016-12-18 ENCOUNTER — Encounter: Payer: Self-pay | Admitting: Cardiology

## 2016-12-18 ENCOUNTER — Ambulatory Visit (INDEPENDENT_AMBULATORY_CARE_PROVIDER_SITE_OTHER): Payer: Medicare Other | Admitting: Cardiology

## 2016-12-18 VITALS — BP 126/60 | HR 56 | Ht 63.0 in | Wt 199.4 lb

## 2016-12-18 DIAGNOSIS — I10 Essential (primary) hypertension: Secondary | ICD-10-CM

## 2016-12-18 DIAGNOSIS — I340 Nonrheumatic mitral (valve) insufficiency: Secondary | ICD-10-CM

## 2016-12-18 DIAGNOSIS — R001 Bradycardia, unspecified: Secondary | ICD-10-CM | POA: Insufficient documentation

## 2016-12-18 DIAGNOSIS — I482 Chronic atrial fibrillation: Secondary | ICD-10-CM | POA: Diagnosis not present

## 2016-12-18 DIAGNOSIS — I4821 Permanent atrial fibrillation: Secondary | ICD-10-CM

## 2016-12-18 NOTE — Patient Instructions (Signed)
Medication Instructions:  Your physician recommends that you continue on your current medications as directed. Please refer to the Current Medication list given to you today.   Labwork: TODAY: BMET, CBC  Testing/Procedures: None  Follow-Up: Your physician wants you to follow-up in: 1 year with Dr. Turner. You will receive a reminder letter in the mail two months in advance. If you don't receive a letter, please call our office to schedule the follow-up appointment.   Any Other Special Instructions Will Be Listed Below (If Applicable).     If you need a refill on your cardiac medications before your next appointment, please call your pharmacy.   

## 2016-12-18 NOTE — Progress Notes (Signed)
Cardiology Office Note    Date:  12/18/2016   ID:  Anna [name], DOB [birth year], MRN [id number]  PCP:  Pete Glatter, MD  Cardiologist:  Armanda Magic, MD   Chief Complaint  Patient presents with  . Atrial Fibrillation  . Hypertension    History of Present Illness:  Anna [name] is a 81 y.o. female with a history of HTN, permanent  atrial fibrillation on Xarelto due to her CHADS2VASC score of 3, mild  MR by echo. She denies any chest pain or DOE. She denies any PND or orthopnea.  She occasionally has some LE edema in her knees.She denies any palpitations, dizziness or syncope.    Past Medical History:  Diagnosis Date  . Hyperlipidemia   . Hypertension   . Osteoarthritis   . Overweight(278.02)   . Permanent atrial fibrillation [facility])     Past Surgical History:  Procedure Laterality Date  . CARDIOVERSION N/A 03/24/2014   Procedure: CARDIOVERSION;  Surgeon: Quintella Reichert, MD;  Location: [facility] ENDOSCOPY;  Service: Cardiovascular;  Laterality: N/A;    Current Medications: Current Meds  Medication Sig  . amLODipine-olmesartan (AZOR) 5-20 MG tablet Take 1 tablet by mouth daily.  . diclofenac sodium (VOLTAREN) 1 % GEL Apply 4 g topically 4 (four) times daily. Apply to legs  . diclofenac sodium (VOLTAREN) 1 % GEL APPLY 4 GRAMS TOPICALLY TO LEGS FOUR TIMES DAILY  . diclofenac sodium (VOLTAREN) 1 % GEL APPLY 4 GRAMS TOPICALLY TO LEGS FOUR TIMES DAILY  . docusate sodium (COLACE) 100 MG capsule Take 100 mg by mouth daily.   Marland Kitchen gabapentin (NEURONTIN) 100 MG capsule Take 1 capsule (100 mg total) by mouth 3 (three) times daily.  Marland Kitchen gabapentin (NEURONTIN) 100 MG capsule TAKE ONE CAPSULE BY MOUTH THREE TIMES DAILY  . Multiple Vitamins-Minerals (CENTRUM SILVER PO) Take 1 tablet by mouth daily.   . Omega-3 Fatty Acids (FISH OIL CONCENTRATE PO) Take 1 capsule by mouth daily.   . rivaroxaban (XARELTO) 20 MG TABS tablet Take 1 tablet (20 mg total) by mouth daily with supper.  .  rosuvastatin (CRESTOR) 10 MG tablet Take 1 tablet (10 mg total) by mouth daily.    Allergies:   Patient has no known allergies.   Social History   Social History  . Marital status: Divorced    Spouse name: N/A  . Number of children: N/A  . Years of education: N/A   Social History Main Topics  . Smoking status: Never Smoker  . Smokeless tobacco: Never Used  . Alcohol use No  . Drug use: No  . Sexual activity: Not Asked   Other Topics Concern  . None   Social History Narrative  . None     Family History:  The patient's family history includes Alzheimer's disease in her mother.   ROS:   Please see the history of present illness.    ROS All other systems reviewed and are negative.  No flowsheet data found.     PHYSICAL EXAM:   VS:  BP 126/60   Pulse (!) 56   Ht 5\' 3"  (1.6 m)   Wt 199 lb 6.4 oz (90.4 kg)   BMI 35.32 kg/m    GEN: Well nourished, well developed, in no acute distress  HEENT: normal  Neck: no JVD, carotid bruits, or masses Cardiac:irregularly irregular; no murmurs, rubs, or gallops,no edema.  Intact distal pulses bilaterally.  Respiratory:  clear to auscultation bilaterally, normal work of breathing GI: soft, nontender,  nondistended, + BS MS: no deformity or atrophy  Skin: warm and dry, no rash Neuro:  Alert and Oriented x 3, Strength and sensation are intact Psych: euthymic mood, full affect  Wt Readings from Last 3 Encounters:  12/18/16 199 lb 6.4 oz (90.4 kg)  09/03/16 199 lb 9.6 oz (90.5 kg)  06/25/16 197 lb 9.6 oz (89.6 kg)      Studies/Labs Reviewed:   EKG:  EKG is not ordered today.    Recent Labs: 06/19/2016: BUN 15; Creat 1.04; Hemoglobin 11.8; Platelets 228; Potassium 4.3; Sodium 137 06/25/2016: TSH 3.49   Lipid Panel    Component Value Date/Time   CHOL 98 (L) 06/29/2015 0945   TRIG 72 06/29/2015 0945   HDL 51 06/29/2015 0945   CHOLHDL 1.9 06/29/2015 0945   VLDL 14 06/29/2015 0945   LDLCALC 33 06/29/2015 0945     Additional studies/ records that were reviewed today include:  none    ASSESSMENT:    1. Permanent atrial fibrillation (HCC)   2. Essential hypertension, benign   3. Mitral valve insufficiency, unspecified etiology   4. Sinus bradycardia      PLAN:  In order of problems listed above:  1. Permanent atrial fibrillation rate controlled.  She will continue on Xarelto.   I will check BMET and CBC. 2. HTN - BP controlled on current meds.  She will continue on amlodipine/ARB.   3. Mild MR by echo 2017.   4. Asymptomatic bradycardia.  She is not on any rate controlling meds.    Medication Adjustments/Labs and Tests Ordered: Current medicines are reviewed at length with the patient today.  Concerns regarding medicines are outlined above.  Medication changes, Labs and Tests ordered today are listed in the Patient Instructions below.  There are no Patient Instructions on file for this visit.   Signed, Armanda Magic, MD  12/18/2016 2:26 PM    [facility] [address] [place], [place], [place]  [address] Phone: [phone]; Fax: [phone]

## 2016-12-19 LAB — BASIC METABOLIC PANEL
BUN/Creatinine Ratio: 16 (ref 12–28)
BUN: 15 mg/dL (ref 8–27)
CALCIUM: 9.4 mg/dL (ref 8.7–10.3)
CO2: 25 mmol/L (ref 18–29)
Chloride: 98 mmol/L (ref 96–106)
Creatinine, Ser: 0.92 mg/dL (ref 0.57–1.00)
GFR, EST AFRICAN AMERICAN: 68 (ref >59)
GFR, EST NON AFRICAN AMERICAN: 59 — AB (ref >59)
Glucose: 161 mg/dL — ABNORMAL HIGH (ref 65–99)
Potassium: 4.5 mmol/L (ref 3.5–5.2)
Sodium: 139 mmol/L (ref 134–144)

## 2016-12-19 LAB — CBC WITH DIFFERENTIAL/PLATELET
BASOS ABS: 0.1 10*3/uL (ref 0.0–0.2)
BASOS: 1 %
EOS (ABSOLUTE): 0.2 10*3/uL (ref 0.0–0.4)
Eos: 3 %
HEMOGLOBIN: 11.2 g/dL (ref 11.1–15.9)
Hematocrit: 34.4 % (ref 34.0–46.6)
IMMATURE GRANS (ABS): 0 10*3/uL (ref 0.0–0.1)
IMMATURE GRANULOCYTES: 0 %
LYMPHS: 36 %
Lymphocytes Absolute: 1.9 10*3/uL (ref 0.7–3.1)
MCH: 27.5 pg (ref 26.6–33.0)
MCHC: 32.6 g/dL (ref 31.5–35.7)
MCV: 84 fL (ref 79–97)
MONOCYTES: 7 %
Monocytes Absolute: 0.4 10*3/uL (ref 0.1–0.9)
NEUTROS ABS: 2.8 10*3/uL (ref 1.4–7.0)
NEUTROS PCT: 53 %
PLATELETS: 238 10*3/uL (ref 150–379)
RBC: 4.08 x10E6/uL (ref 3.77–5.28)
RDW: 13.6 % (ref 12.3–15.4)
WBC: 5.3 10*3/uL (ref 3.4–10.8)

## 2017-01-20 ENCOUNTER — Other Ambulatory Visit: Payer: Self-pay | Admitting: Internal Medicine

## 2017-01-30 ENCOUNTER — Other Ambulatory Visit: Payer: Self-pay | Admitting: Internal Medicine

## 2017-01-30 DIAGNOSIS — M170 Bilateral primary osteoarthritis of knee: Secondary | ICD-10-CM

## 2017-03-10 ENCOUNTER — Ambulatory Visit: Payer: Medicare Other | Admitting: Internal Medicine

## 2017-03-10 ENCOUNTER — Other Ambulatory Visit: Payer: Self-pay | Admitting: Internal Medicine

## 2017-03-10 DIAGNOSIS — M170 Bilateral primary osteoarthritis of knee: Secondary | ICD-10-CM

## 2017-03-13 ENCOUNTER — Encounter: Payer: Self-pay | Admitting: Internal Medicine

## 2017-03-14 ENCOUNTER — Encounter: Payer: Self-pay | Admitting: Internal Medicine

## 2017-03-17 ENCOUNTER — Ambulatory Visit: Payer: Medicare Other | Attending: Internal Medicine | Admitting: Internal Medicine

## 2017-03-17 ENCOUNTER — Encounter: Payer: Self-pay | Admitting: Internal Medicine

## 2017-03-17 VITALS — BP 165/70 | HR 59 | Temp 97.6°F | Resp 16 | Wt 201.4 lb

## 2017-03-17 DIAGNOSIS — Z7901 Long term (current) use of anticoagulants: Secondary | ICD-10-CM | POA: Diagnosis not present

## 2017-03-17 DIAGNOSIS — Z131 Encounter for screening for diabetes mellitus: Secondary | ICD-10-CM

## 2017-03-17 DIAGNOSIS — Z23 Encounter for immunization: Secondary | ICD-10-CM | POA: Diagnosis not present

## 2017-03-17 DIAGNOSIS — R7303 Prediabetes: Secondary | ICD-10-CM | POA: Diagnosis not present

## 2017-03-17 DIAGNOSIS — I4892 Unspecified atrial flutter: Secondary | ICD-10-CM | POA: Diagnosis not present

## 2017-03-17 DIAGNOSIS — I10 Essential (primary) hypertension: Secondary | ICD-10-CM | POA: Diagnosis not present

## 2017-03-17 DIAGNOSIS — Z79899 Other long term (current) drug therapy: Secondary | ICD-10-CM | POA: Insufficient documentation

## 2017-03-17 DIAGNOSIS — Z7984 Long term (current) use of oral hypoglycemic drugs: Secondary | ICD-10-CM | POA: Insufficient documentation

## 2017-03-17 DIAGNOSIS — M170 Bilateral primary osteoarthritis of knee: Secondary | ICD-10-CM | POA: Insufficient documentation

## 2017-03-17 DIAGNOSIS — E785 Hyperlipidemia, unspecified: Secondary | ICD-10-CM | POA: Insufficient documentation

## 2017-03-17 DIAGNOSIS — I482 Chronic atrial fibrillation: Secondary | ICD-10-CM

## 2017-03-17 DIAGNOSIS — I4821 Permanent atrial fibrillation: Secondary | ICD-10-CM

## 2017-03-17 LAB — POCT GLYCOSYLATED HEMOGLOBIN (HGB A1C): Hemoglobin A1C: 6.3

## 2017-03-17 MED ORDER — METFORMIN HCL 500 MG PO TABS: 500.0000 mg | ORAL_TABLET | Freq: Every day | ORAL | 3 refills | Status: DC

## 2017-03-17 MED ORDER — DICLOFENAC SODIUM 1 % TD GEL: 2.0000 g | Freq: Four times a day (QID) | TRANSDERMAL | 3 refills | Status: DC

## 2017-03-17 NOTE — Patient Instructions (Addendum)
Calcium 1200mg  /day + Vit D 800 IU /daily  -   Preventing Type 2 Diabetes Mellitus Type 2 diabetes (type 2 diabetes mellitus) is a long-term (chronic) disease that affects blood sugar (glucose) levels. Normally, a hormone called insulin allows glucose to enter cells in the body. The cells use glucose for energy. In type 2 diabetes, one or both of these problems may be present:  The body does not make enough insulin.  The body does not respond properly to insulin that it makes (insulin resistance). Insulin resistance or lack of insulin causes excess glucose to build up in the blood instead of going into cells. As a result, high blood glucose (hyperglycemia) develops, which can cause many complications. Being overweight or obese and having an inactive (sedentary) lifestyle can increase your risk for diabetes. Type 2 diabetes can be delayed or prevented by making certain nutrition and lifestyle changes. What nutrition changes can be made?  Eat healthy meals and snacks regularly. Keep a healthy snack with you for when you get hungry between meals, such as fruit or a handful of nuts.  Eat lean meats and proteins that are low in saturated fats, such as chicken, fish, egg whites, and beans. Avoid processed meats.  Eat plenty of fruits and vegetables and plenty of grains that have not been processed (whole grains). It is recommended that you eat:  1?2 cups of fruit every day.  2?3 cups of vegetables every day.  6?8 oz of whole grains every day, such as oats, whole wheat, bulgur, brown rice, quinoa, and millet.  Eat low-fat dairy products, such as milk, yogurt, and cheese.  Eat foods that contain healthy fats, such as nuts, avocado, olive oil, and canola oil.  Drink water throughout the day. Avoid drinks that contain added sugar, such as soda or sweet tea.  Follow instructions from your health care provider about specific eating or drinking restrictions.  Control how much food you eat at a  time (portion size).  Check food labels to find out the serving sizes of foods.  Use a kitchen scale to weigh amounts of foods.  Saute or steam food instead of frying it. Cook with water or broth instead of oils or butter.  Limit your intake of:  Salt (sodium). Have no more than 1 tsp (2,400 mg) of sodium a day. If you have heart disease or high blood pressure, have less than ? tsp (1,500 mg) of sodium a day.  Saturated fat. This is fat that is solid at room temperature, such as butter or fat on meat. What lifestyle changes can be made?   Activity   Do moderate-intensity physical activity for at least 30 minutes on at least 5 days of the week, or as much as told by your health care provider.  Ask your health care provider what activities are safe for you. A mix of physical activities may be best, such as walking, swimming, cycling, and strength training.  Try to add physical activity into your day. For example:  Park in spots that are farther away than usual, so that you walk more. For example, park in a far corner of the parking lot when you go to the office or the grocery store.  Take a walk during your lunch break.  Use stairs instead of elevators or escalators. Weight Loss   Lose weight as directed. Your health care provider can determine how much weight loss is best for you and can help you lose weight safely.  If  you are overweight or obese, you may be instructed to lose at least 5?7 % of your body weight. Alcohol and Tobacco      Do not use any tobacco products, such as cigarettes, chewing tobacco, and e-cigarettes. If you need help quitting, ask your health care provider. Work With Your Health Care Provider   Have your blood glucose tested regularly, as told by your health care provider.  Discuss your risk factors and how you can reduce your risk for diabetes.  Get screening tests as told by your health care provider. You may have screening tests regularly,  especially if you have certain risk factors for type 2 diabetes.  Make an appointment with a diet and nutrition specialist (registered dietitian). A registered dietitian can help you make a healthy eating plan and can help you understand portion sizes and food labels. Why are these changes important?  It is possible to prevent or delay type 2 diabetes and related health problems by making lifestyle and nutrition changes.  It can be difficult to recognize signs of type 2 diabetes. The best way to avoid possible damage to your body is to take actions to prevent the disease before you develop symptoms. What can happen if changes are not made?  Your blood glucose levels may keep increasing. Having high blood glucose for a long time is dangerous. Too much glucose in your blood can damage your blood vessels, heart, kidneys, nerves, and eyes.  You may develop prediabetes or type 2 diabetes. Type 2 diabetes can lead to many chronic health problems and complications, such as:  Heart disease.  Stroke.  Blindness.  Kidney disease.  Depression.  Poor circulation in the feet and legs, which could lead to surgical removal (amputation) in severe cases. Where to find support:  Ask your health care provider to recommend a registered dietitian, diabetes educator, or weight loss program.  Look for local or online weight loss groups.  Join a gym, fitness club, or outdoor activity group, such as a walking club. Where to find more information: To learn more about diabetes and diabetes prevention, visit:  American Diabetes Association (ADA): www.diabetes.[employer] of Diabetes and Digestive and Kidney Diseases: ToyArticles.ca To learn more about healthy eating, visit:  The U.S. Department of Agriculture Architect), Choose My Plate: http://yates.biz/  Office of Disease Prevention and Health Promotion (ODPHP), Dietary Guidelines:  ListingMagazine.si Summary  You can reduce your risk for type 2 diabetes by increasing your physical activity, eating healthy foods, and losing weight as directed.  Talk with your health care provider about your risk for type 2 diabetes. Ask about any blood tests or screening tests that you need to have. This information is not intended to replace advice given to you by your health care provider. Make sure you discuss any questions you have with your health care provider. Document Released: 02/05/2016 Document Revised: 03/21/2016 Document Reviewed: 12/05/2015 Elsevier Interactive Patient Education  2017 Elsevier Inc.   -  Low-Sodium Eating Plan Sodium, which is an element that makes up salt, helps you maintain a healthy balance of fluids in your body. Too much sodium can increase your blood pressure and cause fluid and waste to be held in your body. Your health care provider or dietitian may recommend following this plan if you have high blood pressure (hypertension), kidney disease, liver disease, or heart failure. Eating less sodium can help lower your blood pressure, reduce swelling, and protect your heart, liver, and kidneys. What are tips for following  this plan? General guidelines   Most people on this plan should limit their sodium intake to 1,500-2,000 mg (milligrams) of sodium each day. Reading food labels   The Nutrition Facts label lists the amount of sodium in one serving of the food. If you eat more than one serving, you must multiply the listed amount of sodium by the number of servings.  Choose foods with less than 140 mg of sodium per serving.  Avoid foods with 300 mg of sodium or more per serving. Shopping   Look for lower-sodium products, often labeled as "low-sodium" or "no salt added."  Always check the sodium content even if foods are labeled as "unsalted" or "no salt added".  Buy fresh foods.  Avoid canned foods and premade or frozen  meals.  Avoid canned, cured, or processed meats  Buy breads that have less than 80 mg of sodium per slice. Cooking   Eat more home-cooked food and less restaurant, buffet, and fast food.  Avoid adding salt when cooking. Use salt-free seasonings or herbs instead of table salt or sea salt. Check with your health care provider or pharmacist before using salt substitutes.  Cook with plant-based oils, such as canola, sunflower, or olive oil. Meal planning   When eating at a restaurant, ask that your food be prepared with less salt or no salt, if possible.  Avoid foods that contain MSG (monosodium glutamate). MSG is sometimes added to [place] food, bouillon, and some canned foods. What foods are recommended? The items listed may not be a complete list. Talk with your dietitian about what dietary choices are best for you. Grains  Low-sodium cereals, including oats, puffed wheat and rice, and shredded wheat. Low-sodium crackers. Unsalted rice. Unsalted pasta. Low-sodium bread. Whole-grain breads and whole-grain pasta. Vegetables  Fresh or frozen vegetables. "No salt added" canned vegetables. "No salt added" tomato sauce and paste. Low-sodium or reduced-sodium tomato and vegetable juice. Fruits  Fresh, frozen, or canned fruit. Fruit juice. Meats and other protein foods  Fresh or frozen (no salt added) meat, poultry, seafood, and fish. Low-sodium canned tuna and salmon. Unsalted nuts. Dried peas, beans, and lentils without added salt. Unsalted canned beans. Eggs. Unsalted nut butters. Dairy  Milk. Soy milk. Cheese that is naturally low in sodium, such as ricotta cheese, fresh mozzarella, or Swiss cheese Low-sodium or reduced-sodium cheese. Cream cheese. Yogurt. Fats and oils  Unsalted butter. Unsalted margarine with no trans fat. Vegetable oils such as canola or olive oils. Seasonings and other foods  Fresh and dried herbs and spices. Salt-free seasonings. Low-sodium mustard and ketchup.  Sodium-free salad dressing. Sodium-free light mayonnaise. Fresh or refrigerated horseradish. Lemon juice. Vinegar. Homemade, reduced-sodium, or low-sodium soups. Unsalted popcorn and pretzels. Low-salt or salt-free chips. What foods are not recommended? The items listed may not be a complete list. Talk with your dietitian about what dietary choices are best for you. Grains  Instant hot cereals. Bread stuffing, pancake, and biscuit mixes. Croutons. Seasoned rice or pasta mixes. Noodle soup cups. Boxed or frozen macaroni and cheese. Regular salted crackers. Self-rising flour. Vegetables  Sauerkraut, pickled vegetables, and relishes. Olives. [place] fries. Onion rings. Regular canned vegetables (not low-sodium or reduced-sodium). Regular canned tomato sauce and paste (not low-sodium or reduced-sodium). Regular tomato and vegetable juice (not low-sodium or reduced-sodium). Frozen vegetables in sauces. Meats and other protein foods  Meat or fish that is salted, canned, smoked, spiced, or pickled. Bacon, ham, sausage, hotdogs, corned beef, chipped beef, packaged lunch meats, salt pork, jerky, pickled  herring, anchovies, regular canned tuna, sardines, salted nuts. Dairy  Processed cheese and cheese spreads. Cheese curds. Blue cheese. Feta cheese. String cheese. Regular cottage cheese. Buttermilk. Canned milk. Fats and oils  Salted butter. Regular margarine. Ghee. Bacon fat. Seasonings and other foods  Onion salt, garlic salt, seasoned salt, table salt, and sea salt. Canned and packaged gravies. Worcestershire sauce. Tartar sauce. Barbecue sauce. Teriyaki sauce. Soy sauce, including reduced-sodium. Steak sauce. Fish sauce. Oyster sauce. Cocktail sauce. Horseradish that you find on the shelf. Regular ketchup and mustard. Meat flavorings and tenderizers. Bouillon cubes. Hot sauce and Tabasco sauce. Premade or packaged marinades. Premade or packaged taco seasonings. Relishes. Regular salad dressings. Salsa. Potato  and tortilla chips. Corn chips and puffs. Salted popcorn and pretzels. Canned or dried soups. Pizza. Frozen entrees and pot pies. Summary  Eating less sodium can help lower your blood pressure, reduce swelling, and protect your heart, liver, and kidneys.  Most people on this plan should limit their sodium intake to 1,500-2,000 mg (milligrams) of sodium each day.  Canned, boxed, and frozen foods are high in sodium. Restaurant foods, fast foods, and pizza are also very high in sodium. You also get sodium by adding salt to food.  Try to cook at home, eat more fresh fruits and vegetables, and eat less fast food, canned, processed, or prepared foods. This information is not intended to replace advice given to you by your health care provider. Make sure you discuss any questions you have with your health care provider. Document Released: 04/05/2002 Document Revised: 10/07/2016 Document Reviewed: 10/07/2016 Elsevier Interactive Patient Education  2017 Elsevier Inc.  -  Hypertension Hypertension is another name for high blood pressure. High blood pressure forces your heart to work harder to pump blood. This can cause problems over time. There are two numbers in a blood pressure reading. There is a top number (systolic) over a bottom number (diastolic). It is best to have a blood pressure below 120/80. Healthy choices can help lower your blood pressure. You may need medicine to help lower your blood pressure if:  Your blood pressure cannot be lowered with healthy choices.  Your blood pressure is higher than 130/80. Follow these instructions at home: Eating and drinking   If directed, follow the DASH eating plan. This diet includes:  Filling half of your plate at each meal with fruits and vegetables.  Filling one quarter of your plate at each meal with whole grains. Whole grains include whole wheat pasta, brown rice, and whole grain bread.  Eating or drinking low-fat dairy products, such as  skim milk or low-fat yogurt.  Filling one quarter of your plate at each meal with low-fat (lean) proteins. Low-fat proteins include fish, skinless chicken, eggs, beans, and tofu.  Avoiding fatty meat, cured and processed meat, or chicken with skin.  Avoiding premade or processed food.  Eat less than 1,500 mg of salt (sodium) a day.  Limit alcohol use to no more than 1 drink a day for nonpregnant women and 2 drinks a day for men. One drink equals 12 oz of beer, 5 oz of wine, or 1 oz of hard liquor. Lifestyle   Work with your doctor to stay at a healthy weight or to lose weight. Ask your doctor what the best weight is for you.  Get at least 30 minutes of exercise that causes your heart to beat faster (aerobic exercise) most days of the week. This may include walking, swimming, or biking.  Get at least 30  minutes of exercise that strengthens your muscles (resistance exercise) at least 3 days a week. This may include lifting weights or pilates.  Do not use any products that contain nicotine or tobacco. This includes cigarettes and e-cigarettes. If you need help quitting, ask your doctor.  Check your blood pressure at home as told by your doctor.  Keep all follow-up visits as told by your doctor. This is important. Medicines   Take over-the-counter and prescription medicines only as told by your doctor. Follow directions carefully.  Do not skip doses of blood pressure medicine. The medicine does not work as well if you skip doses. Skipping doses also puts you at risk for problems.  Ask your doctor about side effects or reactions to medicines that you should watch for. Contact a doctor if:  You think you are having a reaction to the medicine you are taking.  You have headaches that keep coming back (recurring).  You feel dizzy.  You have swelling in your ankles.  You have trouble with your vision. Get help right away if:  You get a very bad headache.  You start to feel  confused.  You feel weak or numb.  You feel faint.  You get very bad pain in your:  Chest.  Belly (abdomen).  You throw up (vomit) more than once.  You have trouble breathing. Summary  Hypertension is another name for high blood pressure.  Making healthy choices can help lower blood pressure. If your blood pressure cannot be controlled with healthy choices, you may need to take medicine. This information is not intended to replace advice given to you by your health care provider. Make sure you discuss any questions you have with your health care provider. Document Released: 04/01/2008 Document Revised: 09/11/2016 Document Reviewed: 09/11/2016 Elsevier Interactive Patient Education  2017 Elsevier Inc.  Td Vaccine (Tetanus and Diphtheria): What You Need to Know 1. Why get vaccinated? Tetanus  and diphtheria are very serious diseases. They are rare in the [place] today, but people who do become infected often have severe complications. Td vaccine is used to protect adolescents and adults from both of these diseases. Both tetanus and diphtheria are infections caused by bacteria. Diphtheria spreads from person to person through coughing or sneezing. Tetanus-causing bacteria enter the body through cuts, scratches, or wounds. TETANUS (lockjaw) causes painful muscle tightening and stiffness, usually all over the body.  It can lead to tightening of muscles in the head and neck so you can't open your mouth, swallow, or sometimes even breathe. Tetanus kills about 1 out of every 10 people who are infected even after receiving the best medical care. DIPHTHERIA can cause a thick coating to form in the back of the throat.  It can lead to breathing problems, paralysis, heart failure, and death. Before vaccines, as many as 200,000 cases of diphtheria and hundreds of cases of tetanus were reported in the [place] each year. Since vaccination began, reports of cases for both diseases have  dropped by about 99%. 2. Td vaccine Td vaccine can protect adolescents and adults from tetanus and diphtheria. Td is usually given as a booster dose every 10 years but it can also be given earlier after a severe and dirty wound or burn. Another vaccine, called Tdap, which protects against pertussis in addition to tetanus and diphtheria, is sometimes recommended instead of Td vaccine. Your doctor or the person giving you the vaccine can give you more information. Td may safely be given at the  same time as other vaccines. 3. Some people should not get this vaccine  A person who has ever had a life-threatening allergic reaction after a previous dose of any tetanus or diphtheria containing vaccine, OR has a severe allergy to any part of this vaccine, should not get Td vaccine. Tell the person giving the vaccine about any severe allergies.  Talk to your doctor if you:  had severe pain or swelling after any vaccine containing diphtheria or tetanus,  ever had a condition called Guillain Barre Syndrome (GBS),  aren't feeling well on the day the shot is scheduled. 4. What are the risks from Td vaccine? With any medicine, including vaccines, there is a chance of side effects. These are usually mild and go away on their own. Serious reactions are also possible but are rare. Most people who get Td vaccine do not have any problems with it. Mild problems following Td vaccine:  (Did not interfere with activities)  Pain where the shot was given (about 8 people in 10)  Redness or swelling where the shot was given (about 1 person in 4)  Mild fever (rare)  Headache (about 1 person in 4)  Tiredness (about 1 person in 4) Moderate problems following Td vaccine:  (Interfered with activities, but did not require medical attention)  Fever over 102F (rare) Severe problems following Td vaccine:  (Unable to perform usual activities; required medical attention)  Swelling, severe pain, bleeding and/or  redness in the arm where the shot was given (rare). Problems that could happen after any vaccine:   People sometimes faint after a medical procedure, including vaccination. Sitting or lying down for about 15 minutes can help prevent fainting, and injuries caused by a fall. Tell your doctor if you feel dizzy, or have vision changes or ringing in the ears.  Some people get severe pain in the shoulder and have difficulty moving the arm where a shot was given. This happens very rarely.  Any medication can cause a severe allergic reaction. Such reactions from a vaccine are very rare, estimated at fewer than 1 in a million doses, and would happen within a few minutes to a few hours after the vaccination. As with any medicine, there is a very remote chance of a vaccine causing a serious injury or death. The safety of vaccines is always being monitored. For more information, visit: http://floyd.org/ 5. What if there is a serious reaction? What should I look for?  Look for anything that concerns you, such as signs of a severe allergic reaction, very high fever, or unusual behavior. Signs of a severe allergic reaction can include hives, swelling of the face and throat, difficulty breathing, a fast heartbeat, dizziness, and weakness. These would usually start a few minutes to a few hours after the vaccination. What should I do?   If you think it is a severe allergic reaction or other emergency that can't wait, call 9-1-1 or get the person to the nearest hospital. Otherwise, call your doctor.  Afterward, the reaction should be reported to the Vaccine Adverse Event Reporting System (VAERS). Your doctor might file this report, or you can do it yourself through the VAERS web site at www.vaers.LAgents.no, or by calling 1-604-342-9363.  VAERS does not give medical advice. 6. The National Vaccine Injury Compensation Program The [employer] Vaccine Injury Compensation Program (VICP) is a federal program that  was created to compensate people who may have been injured by certain vaccines. Persons who believe they may have been injured by  a vaccine can learn about the program and about filing a claim by calling 1-601-110-6626 or visiting the VICP website at SpiritualWord.at. There is a time limit to file a claim for compensation. 7. How can I learn more?  Ask your doctor. He or she can give you the vaccine package insert or suggest other sources of information.  Call your local or state health department.  Contact the Centers for Disease Control and Prevention (CDC):  Call 934-004-7625 (1-800-CDC-INFO)  Visit CDC's website at PicCapture.uy CDC Td Vaccine VIS (02/06/16) This information is not intended to replace advice given to you by your health care provider. Make sure you discuss any questions you have with your health care provider. Document Released: 08/11/2006 Document Revised: 07/04/2016 Document Reviewed: 07/04/2016 Elsevier Interactive Patient Education  2017 [employer].

## 2017-03-17 NOTE — Progress Notes (Signed)
Anna [name], is a 81 y.o. female  ZOX:[id number]  [id number]:[id number]  DOB - September 15, 1936  Chief Complaint  Patient presents with  . Hypertension        Subjective:   Anna [name] is a 81 y.o. female here today for a follow up visit, last seen 09/03/16, w/ hx of  chronic aflutter, on xarelto, htn, mild -mod AR. Of note, she is not currently wearing her knee braces, but does wear it sometimes which does help relieve her knee pains. She notes thought that the Voltaren gel helps considerably and does not take much pills for pain now.   She last saw cards on 12/18/16, bp nml at that time.   Patient has No headache, No chest pain, No abdominal pain - No Nausea, No new weakness tingling or numbness, No Cough - SOB.  No problems updated.  ALLERGIES: No Known Allergies  PAST MEDICAL HISTORY: Past Medical History:  Diagnosis Date  . Hyperlipidemia   . Hypertension   . Osteoarthritis   . Overweight(278.02)   . Permanent atrial fibrillation (HCC)     MEDICATIONS AT HOME: Prior to Admission medications   Medication Sig Start Date End Date Taking? Authorizing Provider  amLODipine-olmesartan (AZOR) 5-20 MG tablet Take 1 tablet by mouth daily. 09/03/16   Pete Glatter, MD  diclofenac sodium (VOLTAREN) 1 % GEL Apply 2 g topically 4 (four) times daily. 03/17/17   Pete Glatter, MD  docusate sodium (COLACE) 100 MG capsule Take 100 mg by mouth daily.     [provider]  gabapentin (NEURONTIN) 100 MG capsule TAKE ONE CAPSULE BY MOUTH THREE TIMES DAILY 01/30/17   Pete Glatter, MD  metFORMIN (GLUCOPHAGE) 500 MG tablet Take 1 tablet (500 mg total) by mouth daily with breakfast. 03/17/17   Pete Glatter, MD  Multiple Vitamins-Minerals (CENTRUM SILVER PO) Take 1 tablet by mouth daily.     [provider]  Omega-3 Fatty Acids (FISH OIL CONCENTRATE PO) Take 1 capsule by mouth daily.     [provider]  rivaroxaban (XARELTO) 20 MG TABS tablet Take 1 tablet (20 mg  total) by mouth daily with supper. 09/03/16   Pete Glatter, MD  rosuvastatin (CRESTOR) 10 MG tablet Take 1 tablet (10 mg total) by mouth daily. 09/03/16   Pete Glatter, MD     Objective:   Vitals:   03/17/17 1408  BP: (!) 165/70  Pulse: (!) 59  Resp: 16  Temp: 97.6 F (36.4 C)  TempSrc: Oral  SpO2: 96%  Weight: 201 lb 6.4 oz (91.4 kg)    Exam General appearance : Awake, alert, not in any distress. Speech Clear. Not toxic looking, obese, pleasant.  HEENT: Atraumatic and Normocephalic, pupils equally reactive to light. Neck: supple, no JVD.  Chest:Good air entry bilaterally, no added sounds. CVS: irreg irreg s1 s2, no murmurs/gallups or rubs. Abdomen: Bowel sounds active, Non tender and not distended with no gaurding, rigidity or rebound. Extremities: B/L Lower Ext shows no edema, both legs are warm to touch Neurology: Awake alert, and oriented X 3, CN II-XII grossly intact, Non focal Skin:No Rash  Data Review No results found for: HGBA1C  Depression screen [facility] 2/9 03/17/2017 09/03/2016 06/25/2016 01/30/2016 03/10/2015  Decreased Interest 0 0 2 0 0  Down, Depressed, Hopeless 0 0 0 0 -  PHQ - 2 Score 0 0 2 0 0  Altered sleeping - - 0 - -  Tired, decreased energy - - 1 - -  Change in appetite - - 1 - -  Feeling bad or failure about yourself  - - 0 - -  Trouble concentrating - - 0 - -  Moving slowly or fidgety/restless - - 0 - -  Suicidal thoughts - - 0 - -  PHQ-9 Score - - 4 - -      Assessment & Plan   1. Atrial flutter, unspecified type (HCC) / Permanent atrial fibrillation (HCC) Doing well overall, on xarelto - denies c/o of bleeding, she had hx of cardioversion 2015. Defer to cards  2. Essential hypertension, benign Elevated today, may be due to rushing. She was normal in past and in cardiology visit 12/18/16, recd low salt diet for now. - close f/u.; - continue azor  3 Diabetes mellitus screening, noted Prediabetes on screening - Metformin helps your  body process sugars better, also helps w/ some weight loss as well.   But can cause some indigestion/n/diarrhea, but sx usually resolved in 2 wks or so. - HgB A1c 6.3 - low carb diet discussed - pt amendable to starting metformin 500qd, will try this. If unable to tol, instructed to call us and we can switch to xl version. - increase exersize   5. Osteoarthritis of both knees, unspecified osteoarthritis type - wear bilat knee braces as able and when out and about - diclofenac sodium (VOLTAREN) 1 % GEL; Apply 2 g topically 4 (four) times daily.  Dispense: 400 g; Refill: 3  (unable to tolerate po nsaids due to risk of gi bleed, on xarelto as well)  6. Prediabetes See #3  7. tdap today    Patient have been counseled extensively about nutrition and exercise  Return in about 3 months (around 06/17/2017), or if symptoms worsen or fail to improve.  The patient was given clear instructions to go to ER or return to medical center if symptoms don't improve, worsen or new problems develop. The patient verbalized understanding. The patient was told to call to get lab results if they haven't heard anything in the next week.   This note has been created with Education officer, environmental. Any transcriptional errors are unintentional.   Pete Glatter, MD, MBA/MHA [facility] [place], [place] [phone]   03/17/2017, 5:16 PM

## 2017-06-17 ENCOUNTER — Encounter: Payer: Self-pay | Admitting: Internal Medicine

## 2017-06-17 ENCOUNTER — Ambulatory Visit: Payer: Medicare Other | Attending: Internal Medicine | Admitting: Internal Medicine

## 2017-06-17 VITALS — BP 158/75 | HR 60 | Temp 98.0°F | Resp 16 | Wt 194.6 lb

## 2017-06-17 DIAGNOSIS — Z7901 Long term (current) use of anticoagulants: Secondary | ICD-10-CM | POA: Diagnosis not present

## 2017-06-17 DIAGNOSIS — R001 Bradycardia, unspecified: Secondary | ICD-10-CM | POA: Diagnosis not present

## 2017-06-17 DIAGNOSIS — I340 Nonrheumatic mitral (valve) insufficiency: Secondary | ICD-10-CM | POA: Insufficient documentation

## 2017-06-17 DIAGNOSIS — M170 Bilateral primary osteoarthritis of knee: Secondary | ICD-10-CM | POA: Insufficient documentation

## 2017-06-17 DIAGNOSIS — I482 Chronic atrial fibrillation: Secondary | ICD-10-CM | POA: Diagnosis not present

## 2017-06-17 DIAGNOSIS — E785 Hyperlipidemia, unspecified: Secondary | ICD-10-CM | POA: Diagnosis not present

## 2017-06-17 DIAGNOSIS — I10 Essential (primary) hypertension: Secondary | ICD-10-CM | POA: Diagnosis not present

## 2017-06-17 DIAGNOSIS — I4892 Unspecified atrial flutter: Secondary | ICD-10-CM | POA: Diagnosis not present

## 2017-06-17 DIAGNOSIS — Z7984 Long term (current) use of oral hypoglycemic drugs: Secondary | ICD-10-CM | POA: Diagnosis not present

## 2017-06-17 DIAGNOSIS — R7303 Prediabetes: Secondary | ICD-10-CM | POA: Diagnosis not present

## 2017-06-17 LAB — GLUCOSE, POCT (MANUAL RESULT ENTRY): POC GLUCOSE: 95 mg/dL (ref 70–99)

## 2017-06-17 MED ORDER — AMLODIPINE BESYLATE 5 MG PO TABS: ORAL_TABLET | ORAL | 3 refills | Status: DC

## 2017-06-17 NOTE — Patient Instructions (Signed)
I have added Amlodipine 5 mg to take daily with your current blood pressure medication.

## 2017-06-17 NOTE — Progress Notes (Signed)
Patient ID: Anna [name], female    DOB: 06-14-1936  MRN: [id number]  CC: re-establish   Subjective: Anna [name] is a 81 y.o. female who presents for chronic disease management. Last saw Dr. Illene Labrador in May of this year Her concerns today include:  81 year old female with history of atrial flutter on Xarelto with history of cardioversion 2015, HTN, mild to moderate AR, OA knees, HL, prediabetes.  1. OA -has form for handicap sticker -walks with cane and wears braces BL. Wearing only RT brace today. This is the one that bothers her the most -pain in knees worse after she run errands. -Voltaren gel works well for her.  Also on Gabapentin. Denies drowsiness -not interested in ortho referral. Had inj in past, effect only lasted few wks.  "I don't want them to cut on me either." -no falls in last 1 yr. Lives alone. Has daughter and grandchildren who live nearby. -she does her own cooking and cleaning  2. HTN: compliant with Norvasc/Olmesartan -limits salt in foods -no CP/SOB/LE edema/dizziness  3. A.flutter: no palpitations. No bruising or bleeding  4. HL: tolerating Crestor  5. Pre-DM: tolerating Metformin.  "It helps me not to eat as much and makes my bowels more regular.  It really makes you feel full. -down 7 lbs since 02/2017 which she attributes to the Metformin  No feelings of depression or anxious  Patient Active Problem List   Diagnosis Date Noted  . Sinus bradycardia 12/18/2016  . Estrogen deficiency 07/03/2016  . Arthritis, senescent 03/10/2015  . Mitral regurgitation 03/01/2014  . Atrial flutter (HCC) 03/01/2014  . Permanent atrial fibrillation (HCC) 12/24/2013  . Essential hypertension, benign 12/24/2013  . Heart murmur 12/24/2013  . Hyperlipemia 12/24/2013     Current Outpatient Prescriptions on File Prior to Visit  Medication Sig Dispense Refill  . amLODipine-olmesartan (AZOR) 5-20 MG tablet Take 1 tablet by mouth daily. 90 tablet 2  . diclofenac  sodium (VOLTAREN) 1 % GEL Apply 2 g topically 4 (four) times daily. 400 g 3  . docusate sodium (COLACE) 100 MG capsule Take 100 mg by mouth daily.     Marland Kitchen gabapentin (NEURONTIN) 100 MG capsule TAKE ONE CAPSULE BY MOUTH THREE TIMES DAILY 90 capsule 0  . metFORMIN (GLUCOPHAGE) 500 MG tablet Take 1 tablet (500 mg total) by mouth daily with breakfast. 90 tablet 3  . Multiple Vitamins-Minerals (CENTRUM SILVER PO) Take 1 tablet by mouth daily.     . Omega-3 Fatty Acids (FISH OIL CONCENTRATE PO) Take 1 capsule by mouth daily.     . rivaroxaban (XARELTO) 20 MG TABS tablet Take 1 tablet (20 mg total) by mouth daily with supper. 90 tablet 3  . rosuvastatin (CRESTOR) 10 MG tablet Take 1 tablet (10 mg total) by mouth daily. 90 tablet 3   No current facility-administered medications on file prior to visit.     No Known Allergies  Social History   Social History  . Marital status: Divorced    Spouse name: N/A  . Number of children: N/A  . Years of education: N/A   Occupational History  . Not on file.   Social History Main Topics  . Smoking status: Never Smoker  . Smokeless tobacco: Never Used  . Alcohol use No  . Drug use: No  . Sexual activity: Not on file   Other Topics Concern  . Not on file   Social History Narrative  . No narrative on file    Family History  Problem Relation Age of Onset  . Alzheimer's disease Mother     Past Surgical History:  Procedure Laterality Date  . CARDIOVERSION N/A 03/24/2014   Procedure: CARDIOVERSION;  Surgeon: Quintella Reichert, MD;  Location: [facility];  Service: Cardiovascular;  Laterality: N/A;    ROS: Review of Systems Neg except as stated above PHYSICAL EXAM: BP (!) 158/75   Pulse 60   Temp 98 F (36.7 C) (Oral)   Resp 16   Wt 194 lb 9.6 oz (88.3 kg)   SpO2 98%   BMI 34.47 kg/m   Repeat BP 160/60  Wt Readings from Last 3 Encounters:  06/17/17 194 lb 9.6 oz (88.3 kg)  03/17/17 201 lb 6.4 oz (91.4 kg)  12/18/16 199 lb 6.4 oz  (90.4 kg)    Physical Exam  General appearance - alert, well appearing, pleasant elderly female and in no distress Mental status - alert, oriented to person, place, and time, normal mood, behavior, speech, dress, motor activity, and thought processes Eyes - pupils equal and reactive, extraocular eye movements intact Mouth -dentures above, edentulous below Neck - supple, no significant adenopathy Chest - clear to auscultation, no wheezes, rales or rhonchi, symmetric air entry Heart -irregular. Rate controlled Musculoskeletal - slowed gait. Ambulates with 4 prong cane.  Has brace on RT knee. Knee jts: mild to mod discomfort with passive movement of RT Extremities - no Le edema  Lab Results  Component Value Date   WBC 5.3 12/18/2016   HGB 11.2 12/18/2016   HCT 34.4 12/18/2016   MCV 84 12/18/2016   PLT 238 12/18/2016     Chemistry      Component Value Date/Time   NA 139 12/18/2016 1452   K 4.5 12/18/2016 1452   CL 98 12/18/2016 1452   CO2 25 12/18/2016 1452   BUN 15 12/18/2016 1452   CREATININE 0.92 12/18/2016 1452   CREATININE 1.04 (H) 06/19/2016 1503      Component Value Date/Time   CALCIUM 9.4 12/18/2016 1452   ALKPHOS 56 10/26/2015 1204   AST 24 10/26/2015 1204   ALT 11 10/26/2015 1204   BILITOT 0.6 10/26/2015 1204     Lab Results  Component Value Date   HGBA1C 6.3 03/17/2017   Depression screen PHQ 2/9 06/17/2017 03/17/2017 09/03/2016 06/25/2016 01/30/2016  Decreased Interest 0 0 0 2 0  Down, Depressed, Hopeless 0 0 0 0 0  PHQ - 2 Score 0 0 0 2 0  Altered sleeping - - - 0 -  Tired, decreased energy - - - 1 -  Change in appetite - - - 1 -  Feeling bad or failure about yourself  - - - 0 -  Trouble concentrating - - - 0 -  Moving slowly or fidgety/restless - - - 0 -  Suicidal thoughts - - - 0 -  PHQ-9 Score - - - 4 -   Results for orders placed or performed in visit on 06/17/17  POCT glucose (manual entry)  Result Value Ref Range   POC Glucose 95 70 - 99 mg/dl    BS today 95 ASSESSMENT AND PLAN: 1. Essential hypertension, benign -not at goal and was elevated on last visit also.  Will add low dsoe Norvasc to Norvasc/Olmesartan 5/20mg .  Then do have the 10/20 mg dose but pt just got a 90 day supply of the 5/20mg . - amLODipine (NORVASC) 5 MG tablet; Take one tab PO with one tablet of Amlodipine/Olmesartan daily.  Dispense: 90 tablet; Refill: 3  2.  Atrial flutter, unspecified type (HCC) -okay on Xalrelto. CBC and Chem from earlier this yr reviewed  3. Osteoarthritis of both knees, unspecified osteoarthritis type Form completed for handicap sticker  4. Prediabetes -doing ok on Metformin She is not concerned about wgh loss as she reports eating less since being on Metformin. - POCT glucose (manual entry)   Patient was given the opportunity to ask questions.  Patient verbalized understanding of the plan and was able to repeat key elements of the plan.   Orders Placed This Encounter  Procedures  . POCT glucose (manual entry)     Requested Prescriptions   Signed Prescriptions Disp Refills  . amLODipine (NORVASC) 5 MG tablet 90 tablet 3    Sig: Take one tab PO with one tablet of Amlodipine/Olmesartan daily.    Return in about 3 months (around 09/17/2017).  Jonah Blue, MD, FACP

## 2017-07-28 ENCOUNTER — Other Ambulatory Visit: Payer: Self-pay | Admitting: Pharmacist

## 2017-07-28 DIAGNOSIS — M170 Bilateral primary osteoarthritis of knee: Secondary | ICD-10-CM

## 2017-07-28 MED ORDER — GABAPENTIN 100 MG PO CAPS: 100.0000 mg | ORAL_CAPSULE | Freq: Three times a day (TID) | ORAL | 0 refills | Status: DC

## 2017-08-22 ENCOUNTER — Other Ambulatory Visit: Payer: Self-pay | Admitting: Pharmacist

## 2017-08-22 MED ORDER — AMLODIPINE-OLMESARTAN 5-20 MG PO TABS: 1.0000 | ORAL_TABLET | Freq: Every day | ORAL | 0 refills | Status: DC

## 2017-08-25 ENCOUNTER — Other Ambulatory Visit: Payer: Self-pay | Admitting: Internal Medicine

## 2017-08-25 ENCOUNTER — Other Ambulatory Visit: Payer: Self-pay | Admitting: Pharmacist

## 2017-08-25 DIAGNOSIS — M170 Bilateral primary osteoarthritis of knee: Secondary | ICD-10-CM

## 2017-08-25 MED ORDER — DICLOFENAC SODIUM 1 % TD GEL: 2.0000 g | Freq: Four times a day (QID) | TRANSDERMAL | 0 refills | Status: DC

## 2017-09-16 ENCOUNTER — Telehealth: Payer: Self-pay | Admitting: Internal Medicine

## 2017-09-16 ENCOUNTER — Encounter: Payer: Self-pay | Admitting: Internal Medicine

## 2017-09-16 ENCOUNTER — Ambulatory Visit: Payer: Medicare Other | Attending: Internal Medicine | Admitting: Internal Medicine

## 2017-09-16 VITALS — BP 146/76 | HR 67 | Temp 98.0°F | Resp 18 | Ht 64.0 in | Wt 196.0 lb

## 2017-09-16 DIAGNOSIS — Z79899 Other long term (current) drug therapy: Secondary | ICD-10-CM | POA: Diagnosis not present

## 2017-09-16 DIAGNOSIS — Z7901 Long term (current) use of anticoagulants: Secondary | ICD-10-CM | POA: Insufficient documentation

## 2017-09-16 DIAGNOSIS — Z7984 Long term (current) use of oral hypoglycemic drugs: Secondary | ICD-10-CM | POA: Insufficient documentation

## 2017-09-16 DIAGNOSIS — E785 Hyperlipidemia, unspecified: Secondary | ICD-10-CM | POA: Diagnosis not present

## 2017-09-16 DIAGNOSIS — M170 Bilateral primary osteoarthritis of knee: Secondary | ICD-10-CM | POA: Insufficient documentation

## 2017-09-16 DIAGNOSIS — R7303 Prediabetes: Secondary | ICD-10-CM | POA: Diagnosis not present

## 2017-09-16 DIAGNOSIS — I4892 Unspecified atrial flutter: Secondary | ICD-10-CM | POA: Diagnosis not present

## 2017-09-16 DIAGNOSIS — I10 Essential (primary) hypertension: Secondary | ICD-10-CM

## 2017-09-16 DIAGNOSIS — Z23 Encounter for immunization: Secondary | ICD-10-CM

## 2017-09-16 DIAGNOSIS — Z820 Family history of epilepsy and other diseases of the nervous system: Secondary | ICD-10-CM | POA: Insufficient documentation

## 2017-09-16 MED ORDER — RIVAROXABAN 20 MG PO TABS: 20.0000 mg | ORAL_TABLET | Freq: Every day | ORAL | 3 refills | Status: DC

## 2017-09-16 MED ORDER — AMLODIPINE-OLMESARTAN 10-20 MG PO TABS: 1.0000 | ORAL_TABLET | Freq: Every day | ORAL | 11 refills | Status: DC

## 2017-09-16 NOTE — Progress Notes (Signed)
Patient ID: Anna [name], female    DOB: 04-25-1936  MRN: [id number]  CC: Follow-up   Subjective: Anna [name] is a 81 y.o. female who presents for chronic ds management.  Last seen 05/2017. Her concerns today include:  81 year old female with history of atrial flutter on Xarelto with history of cardioversion 2015, HTN, mild to moderate AR, OA knees, HL, prediabetes.  1. HTN:  On last visit, we added Norvasc 5 mg to  Amlodipine/Omesartan 5/20 mg. She has been taking both but wanted to make certain that this is what she is suppose to be doing -checks BP several times a mth at home with good readings but did not bring a log - 2.  A. Flutter: on Xarelto No brusing or bleeding, no blood in stools or urine -no palpitations  3. OA knees: no falls Ambulates with cane. Wears brace on both knees. Arthritis symptoms worse in winter -using gabapentin and Voltaren gel. The latter is most helpful.  She is not interested in see ortho for injections or surgery -independent in ADLs  4. HL: toleratering Crestor  5. Pre-DM: tolerating Metformin -does her own cooking Patient Active Problem List   Diagnosis Date Noted  . Sinus bradycardia 12/18/2016  . Estrogen deficiency 07/03/2016  . Arthritis, senescent 03/10/2015  . Mitral regurgitation 03/01/2014  . Atrial flutter (HCC) 03/01/2014  . Permanent atrial fibrillation (HCC) 12/24/2013  . Essential hypertension, benign 12/24/2013  . Heart murmur 12/24/2013  . Hyperlipemia 12/24/2013     Current Outpatient Medications on File Prior to Visit  Medication Sig Dispense Refill  . amLODipine (NORVASC) 5 MG tablet Take one tab PO with one tablet of Amlodipine/Olmesartan daily. 90 tablet 3  . amLODipine-olmesartan (AZOR) 5-20 MG tablet Take 1 tablet by mouth daily. 30 tablet 0  . diclofenac sodium (VOLTAREN) 1 % GEL Apply 2 g topically 4 (four) times daily. 400 g 0  . docusate sodium (COLACE) 100 MG capsule Take 100 mg by mouth daily.     Marland Kitchen  gabapentin (NEURONTIN) 100 MG capsule TAKE 1 CAPSULE(100 MG) BY MOUTH THREE TIMES DAILY 90 capsule 0  . metFORMIN (GLUCOPHAGE) 500 MG tablet Take 1 tablet (500 mg total) by mouth daily with breakfast. 90 tablet 3  . Multiple Vitamins-Minerals (CENTRUM SILVER PO) Take 1 tablet by mouth daily.     . Omega-3 Fatty Acids (FISH OIL CONCENTRATE PO) Take 1 capsule by mouth daily.     . rivaroxaban (XARELTO) 20 MG TABS tablet Take 1 tablet (20 mg total) by mouth daily with supper. 90 tablet 3  . rosuvastatin (CRESTOR) 10 MG tablet Take 1 tablet (10 mg total) by mouth daily. 90 tablet 3   No current facility-administered medications on file prior to visit.     No Known Allergies  Social History   Socioeconomic History  . Marital status: Divorced    Spouse name: Not on file  . Number of children: Not on file  . Years of education: Not on file  . Highest education level: Not on file  Social Needs  . Financial resource strain: Not on file  . Food insecurity - worry: Not on file  . Food insecurity - inability: Not on file  . Transportation needs - medical: Not on file  . Transportation needs - non-medical: Not on file  Occupational History  . Not on file  Tobacco Use  . Smoking status: Never Smoker  . Smokeless tobacco: Never Used  Substance and Sexual Activity  . Alcohol  use: No  . Drug use: No  . Sexual activity: Not on file  Other Topics Concern  . Not on file  Social History Narrative  . Not on file    Family History  Problem Relation Age of Onset  . Alzheimer's disease Mother     Past Surgical History:  Procedure Laterality Date  . CARDIOVERSION N/A 03/24/2014   Procedure: CARDIOVERSION;  Surgeon: Quintella Reichertraci R Turner, MD;  Location: [facility];  Service: Cardiovascular;  Laterality: N/A;    ROS: Review of Systems Neg except as stated above  PHYSICAL EXAM: BP (!) 146/76 (BP Location: Left Arm, Patient Position: Sitting, Cuff Size: Large)   Pulse 67   Temp 98 F (36.7 C)  (Oral)   Resp 18   Ht 5\' 4"  (1.626 m)   Wt 196 lb (88.9 kg)   SpO2 98%   BMI 33.64 kg/m   Wt Readings from Last 3 Encounters:  09/16/17 196 lb (88.9 kg)  06/17/17 194 lb 9.6 oz (88.3 kg)  03/17/17 201 lb 6.4 oz (91.4 kg)  repeat BP 160/80  Physical Exam General appearance - alert, well appearing, pleasant elderly female and in no distress Mental status - pt oriented to person, place and time. Normal affect. Answers questions appropriately Mouth - mucous membranes moist, pharynx normal without lesions Neck - supple, no significant adenopathy Chest - clear to auscultation, no wheezes, rales or rhonchi, symmetric air entry Heart - normal rate, regular rhythm, normal S1, S2, no murmurs, rubs, clicks or gallops Musculoskeletal -knees: pt has brace on RT knee only. Ambulates with 4 prong cane. Gait slow  Extremities - no LE edema   Results for orders placed or performed in visit on 06/17/17  POCT glucose (manual entry)  Result Value Ref Range   POC Glucose 95 70 - 99 mg/dl   ASSESSMENT AND PLAN: 1. Prediabetes -continue Metformin and healthy eating habits - Hemoglobin A1c  2. Osteoarthritis of both knees, unspecified osteoarthritis type -continue Voltaren gel. She reports good results using 2-3 times a day  3. Atrial flutter, unspecified type (HCC) - rivaroxaban (XARELTO) 20 MG TABS tablet; Take 1 tablet (20 mg total) by mouth daily with supper.  Dispense: 90 tablet; Refill: 3  4. Essential hypertension Repeat BP elevated today. Will have her return in 1 mth for recheck with clinical pharmacist. Advise to bring BP log with her. -once she finishes current bottles of Norvasc 5 mg and Azor 5/20 mg, will change to Azor 10/20mg . Printed rxn and give to pt - amlodipine-olmesartan (AZOR) 10-20 MG tablet; Take 1 tablet by mouth daily.  Dispense: 30 tablet; Refill: 11 - Comprehensive metabolic panel - CBC  5. Need for Streptococcus pneumoniae and influenza vaccination - Flu Vaccine  QUAD 6+ mos PF IM (Fluarix Quad PF) - Pneumococcal conjugate vaccine 13-valent IM  Patient was given the opportunity to ask questions.  Patient verbalized understanding of the plan and was able to repeat key elements of the plan.   No orders of the defined types were placed in this encounter.    Requested Prescriptions    No prescriptions requested or ordered in this encounter    No Follow-up on file.  Jonah Blueeborah Rosea Dory, MD, FACP

## 2017-09-16 NOTE — Patient Instructions (Signed)
Give appointment with Kennyth Arnold in 1 month for recheck blood pressure.

## 2017-09-16 NOTE — Progress Notes (Signed)
Patient is requesting clarity on BP medications and a 90 day supply of all medications.

## 2017-09-17 ENCOUNTER — Telehealth: Payer: Self-pay | Admitting: Internal Medicine

## 2017-09-17 LAB — COMPREHENSIVE METABOLIC PANEL
A/G RATIO: 1.7 (ref 1.2–2.2)
ALK PHOS: 75 IU/L (ref 39–117)
ALT: 9 IU/L (ref 0–32)
AST: 24 IU/L (ref 0–40)
Albumin: 4.7 g/dL (ref 3.5–4.8)
BUN/Creatinine Ratio: 14 (ref 12–28)
BUN: 13 mg/dL (ref 8–27)
Bilirubin Total: 0.4 mg/dL (ref 0.0–1.2)
CALCIUM: 10 mg/dL (ref 8.7–10.3)
CO2: 23 mmol/L (ref 20–29)
CREATININE: 0.92 mg/dL (ref 0.57–1.00)
Chloride: 102 mmol/L (ref 96–106)
GFR calc Af Amer: 68 mL/min/{1.73_m2} (ref >59)
GFR, EST NON AFRICAN AMERICAN: 59 mL/min/{1.73_m2} — AB (ref >59)
GLOBULIN, TOTAL: 2.8 g/dL (ref 1.5–4.5)
Glucose: 93 mg/dL (ref 65–99)
POTASSIUM: 5.1 mmol/L (ref 3.5–5.2)
SODIUM: 143 mmol/L (ref 134–144)
Total Protein: 7.5 g/dL (ref 6.0–8.5)

## 2017-09-17 LAB — CBC
HEMOGLOBIN: 12.1 g/dL (ref 11.1–15.9)
Hematocrit: 36 % (ref 34.0–46.6)
MCH: 29.4 pg (ref 26.6–33.0)
MCHC: 33.6 g/dL (ref 31.5–35.7)
MCV: 88 fL (ref 79–97)
PLATELETS: 226 10*3/uL (ref 150–379)
RBC: 4.11 x10E6/uL (ref 3.77–5.28)
RDW: 13.9 % (ref 12.3–15.4)
WBC: 7.2 10*3/uL (ref 3.4–10.8)

## 2017-09-17 LAB — HEMOGLOBIN A1C
ESTIMATED AVERAGE GLUCOSE: 128 mg/dL
HEMOGLOBIN A1C: 6.1 % — AB (ref 4.8–5.6)

## 2017-09-17 NOTE — Telephone Encounter (Signed)
Phone call placed to patient today.  I went over the results of blood test done yesterday.  CBC is normal.  She has very mild renal insufficiency but stable compared to 2 years ago.  Liver function tests are normal.  She is still in the range for prediabetes.

## 2017-09-17 NOTE — Telephone Encounter (Signed)
Unable to LVM to informed pt that she has an appt with the nurse for bp next month

## 2017-09-21 ENCOUNTER — Other Ambulatory Visit: Payer: Self-pay | Admitting: Internal Medicine

## 2017-09-21 DIAGNOSIS — M170 Bilateral primary osteoarthritis of knee: Secondary | ICD-10-CM

## 2017-09-26 ENCOUNTER — Other Ambulatory Visit: Payer: Self-pay | Admitting: Internal Medicine

## 2017-09-26 DIAGNOSIS — M170 Bilateral primary osteoarthritis of knee: Secondary | ICD-10-CM

## 2017-10-08 ENCOUNTER — Other Ambulatory Visit: Payer: Self-pay | Admitting: Pharmacist

## 2017-10-08 MED ORDER — ROSUVASTATIN CALCIUM 10 MG PO TABS: 10.0000 mg | ORAL_TABLET | Freq: Every day | ORAL | 0 refills | Status: DC

## 2017-10-23 ENCOUNTER — Ambulatory Visit: Payer: Medicare Other | Attending: Internal Medicine | Admitting: *Deleted

## 2017-10-23 VITALS — BP 135/73 | HR 62

## 2017-10-23 DIAGNOSIS — I10 Essential (primary) hypertension: Secondary | ICD-10-CM | POA: Diagnosis not present

## 2017-10-23 NOTE — Progress Notes (Signed)
Pt arrived to [facility]. Pt alert and oriented.  Pt denies chest pain, SOB, HA, dizziness, or blurred vision.  Verified medication. Pt states medication was taken this morning.  Manual blood pressure reading: 135/73   Pt aware of f/u OV with PCP

## 2017-10-30 ENCOUNTER — Other Ambulatory Visit: Payer: Self-pay | Admitting: Internal Medicine

## 2017-10-30 DIAGNOSIS — M170 Bilateral primary osteoarthritis of knee: Secondary | ICD-10-CM

## 2017-11-26 ENCOUNTER — Other Ambulatory Visit: Payer: Self-pay | Admitting: Internal Medicine

## 2017-11-26 DIAGNOSIS — M170 Bilateral primary osteoarthritis of knee: Secondary | ICD-10-CM

## 2017-12-11 ENCOUNTER — Other Ambulatory Visit: Payer: Self-pay | Admitting: Internal Medicine

## 2017-12-11 DIAGNOSIS — M170 Bilateral primary osteoarthritis of knee: Secondary | ICD-10-CM

## 2017-12-16 DIAGNOSIS — H04123 Dry eye syndrome of bilateral lacrimal glands: Secondary | ICD-10-CM | POA: Diagnosis not present

## 2017-12-16 DIAGNOSIS — H26493 Other secondary cataract, bilateral: Secondary | ICD-10-CM | POA: Diagnosis not present

## 2017-12-16 DIAGNOSIS — H10413 Chronic giant papillary conjunctivitis, bilateral: Secondary | ICD-10-CM | POA: Diagnosis not present

## 2017-12-18 ENCOUNTER — Ambulatory Visit: Payer: Medicare Other | Attending: Internal Medicine | Admitting: Internal Medicine

## 2017-12-18 ENCOUNTER — Encounter: Payer: Self-pay | Admitting: Internal Medicine

## 2017-12-18 VITALS — BP 130/75 | HR 60 | Temp 98.0°F | Resp 16 | Wt 194.6 lb

## 2017-12-18 DIAGNOSIS — Z9181 History of falling: Secondary | ICD-10-CM | POA: Diagnosis not present

## 2017-12-18 DIAGNOSIS — Z79899 Other long term (current) drug therapy: Secondary | ICD-10-CM | POA: Diagnosis not present

## 2017-12-18 DIAGNOSIS — I10 Essential (primary) hypertension: Secondary | ICD-10-CM | POA: Diagnosis not present

## 2017-12-18 DIAGNOSIS — I4892 Unspecified atrial flutter: Secondary | ICD-10-CM | POA: Diagnosis not present

## 2017-12-18 DIAGNOSIS — Z7901 Long term (current) use of anticoagulants: Secondary | ICD-10-CM | POA: Insufficient documentation

## 2017-12-18 DIAGNOSIS — I340 Nonrheumatic mitral (valve) insufficiency: Secondary | ICD-10-CM | POA: Insufficient documentation

## 2017-12-18 DIAGNOSIS — R7303 Prediabetes: Secondary | ICD-10-CM

## 2017-12-18 DIAGNOSIS — Z7984 Long term (current) use of oral hypoglycemic drugs: Secondary | ICD-10-CM | POA: Diagnosis not present

## 2017-12-18 DIAGNOSIS — M170 Bilateral primary osteoarthritis of knee: Secondary | ICD-10-CM | POA: Insufficient documentation

## 2017-12-18 DIAGNOSIS — E785 Hyperlipidemia, unspecified: Secondary | ICD-10-CM | POA: Insufficient documentation

## 2017-12-18 DIAGNOSIS — I482 Chronic atrial fibrillation: Secondary | ICD-10-CM | POA: Insufficient documentation

## 2017-12-18 MED ORDER — AMLODIPINE-OLMESARTAN 10-20 MG PO TABS: 1.0000 | ORAL_TABLET | Freq: Every day | ORAL | 3 refills | Status: DC

## 2017-12-18 NOTE — Progress Notes (Signed)
Patient ID: Anna [name], female    DOB: [birth year]  MRN: [id number]  CC: Hypertension   Subjective: Anna [name] is a 82 y.o. female who presents for chronic ds management. Her concerns today include:  82 year old female with history of atrial flutter on Xarelto with history of cardioversion 2015, HTN, mild to moderate AR, OA knees, HL, prediabetes.  1.  Fell x 2 since last visit.  LT knee gave out while walking in the grocery store the beginning of January.  Landed on both knees.  Did have cane and was using it at the time. Has handicap assessable ramp from her door to her car. Slipped while walking down the ramp on a raining day the end of January.  Fell forward. She did have her cane. Had a lot of bruises.  She was not seen in the emergency room on either occasion.  She did not feel she fractured anything. -has had increase pain in knees since fall.  Using Voltaren gel 3-6 times a day on the knees since the fall. -has a standard walker which she uses only 1st thing in mornings.  2.  Hx of flutter/HTN/moderate AR:  No CP/SOB/palpitations -no LE edema.  No PND/orthopnea -she did get higher dose of Azor from prescription given on last visit  3.  Pre-DM:  Tolerating Metformin  Patient Active Problem List   Diagnosis Date Noted  . Sinus bradycardia 12/18/2016  . Estrogen deficiency 07/03/2016  . Arthritis, senescent 03/10/2015  . Mitral regurgitation 03/01/2014  . Atrial flutter (HCC) 03/01/2014  . Permanent atrial fibrillation (HCC) 12/24/2013  . Essential hypertension, benign 12/24/2013  . Heart murmur 12/24/2013  . Hyperlipemia 12/24/2013     Current Outpatient Medications on File Prior to Visit  Medication Sig Dispense Refill  . diclofenac sodium (VOLTAREN) 1 % GEL APPLY 2 GRAMS EXTERNALLY TO THE AFFECTED AREA FOUR TIMES DAILY 400 g 0  . docusate sodium (COLACE) 100 MG capsule Take 100 mg by mouth daily.     Marland Kitchen gabapentin (NEURONTIN) 100 MG capsule TAKE 1 CAPSULE(100 MG)  BY MOUTH THREE TIMES DAILY 90 capsule 2  . metFORMIN (GLUCOPHAGE) 500 MG tablet Take 1 tablet (500 mg total) by mouth daily with breakfast. 90 tablet 3  . Multiple Vitamins-Minerals (CENTRUM SILVER PO) Take 1 tablet by mouth daily.     . Omega-3 Fatty Acids (FISH OIL CONCENTRATE PO) Take 1 capsule by mouth daily.     . rivaroxaban (XARELTO) 20 MG TABS tablet Take 1 tablet (20 mg total) by mouth daily with supper. 90 tablet 3  . rosuvastatin (CRESTOR) 10 MG tablet Take 1 tablet (10 mg total) by mouth daily. 90 tablet 0   No current facility-administered medications on file prior to visit.     No Known Allergies  Social History   Socioeconomic History  . Marital status: Divorced    Spouse name: Not on file  . Number of children: Not on file  . Years of education: Not on file  . Highest education level: Not on file  Social Needs  . Financial resource strain: Not on file  . Food insecurity - worry: Not on file  . Food insecurity - inability: Not on file  . Transportation needs - medical: Not on file  . Transportation needs - non-medical: Not on file  Occupational History  . Not on file  Tobacco Use  . Smoking status: Never Smoker  . Smokeless tobacco: Never Used  Substance and Sexual Activity  . Alcohol  use: No  . Drug use: No  . Sexual activity: Not on file  Other Topics Concern  . Not on file  Social History Narrative  . Not on file    Family History  Problem Relation Age of Onset  . Alzheimer's disease Mother     Past Surgical History:  Procedure Laterality Date  . CARDIOVERSION N/A 03/24/2014   Procedure: CARDIOVERSION;  Surgeon: Quintella Reichert, MD;  Location: [facility];  Service: Cardiovascular;  Laterality: N/A;    ROS: Review of Systems Negative except as stated above PHYSICAL EXAM: BP 130/75   Pulse 60   Temp 98 F (36.7 C) (Oral)   Resp 16   Wt 194 lb 9.6 oz (88.3 kg)   SpO2 98%   BMI 33.40 kg/m   Physical Exam  General appearance - alert,  well appearing, pleasant elderly female and in no distress Mental status - alert, oriented to person, place, and time, normal mood, behavior, speech, dress, motor activity, and thought processes Neck - supple, no significant adenopathy Chest - clear to auscultation, no wheezes, rales or rhonchi, symmetric air entry Heart - normal rate, regular rhythm, normal S1, S2, no murmurs, rubs, clicks or gallops Musculoskeletal -knees: No discomfort on palpation of the knees.  Mild discomfort with passive movement of the right knee.  No discomfort with passive movement of the left.  Ambulating with a quad cane Extremities -no lower extremity edema  Results for orders placed or performed in visit on 09/16/17  Comprehensive metabolic panel  Result Value Ref Range   Glucose 93 65 - 99 mg/dL   BUN 13 8 - 27 mg/dL   Creatinine, Ser 9.52 0.57 - 1.00 mg/dL   GFR calc non Af Amer 59 (L) >59 mL/min/1.73   GFR calc Af Amer 68 >59 mL/min/1.73   BUN/Creatinine Ratio 14 12 - 28   Sodium 143 134 - 144 mmol/L   Potassium 5.1 3.5 - 5.2 mmol/L   Chloride 102 96 - 106 mmol/L   CO2 23 20 - 29 mmol/L   Calcium 10.0 8.7 - 10.3 mg/dL   Total Protein 7.5 6.0 - 8.5 g/dL   Albumin 4.7 3.5 - 4.8 g/dL   Globulin, Total 2.8 1.5 - 4.5 g/dL   Albumin/Globulin Ratio 1.7 1.2 - 2.2   Bilirubin Total 0.4 0.0 - 1.2 mg/dL   Alkaline Phosphatase 75 39 - 117 IU/L   AST 24 0 - 40 IU/L   ALT 9 0 - 32 IU/L  CBC  Result Value Ref Range   WBC 7.2 3.4 - 10.8 x10E3/uL   RBC 4.11 3.77 - 5.28 x10E6/uL   Hemoglobin 12.1 11.1 - 15.9 g/dL   Hematocrit 84.1 32.4 - 46.6 %   MCV 88 79 - 97 fL   MCH 29.4 26.6 - 33.0 pg   MCHC 33.6 31.5 - 35.7 g/dL   RDW 40.1 02.7 - 25.3 %   Platelets 226 150 - 379 x10E3/uL  Hemoglobin A1c  Result Value Ref Range   Hgb A1c MFr Bld 6.1 (H) 4.8 - 5.6 %   Est. average glucose Bld gHb Est-mCnc 128 mg/dL   ASSESSMENT AND PLAN: 1. Essential hypertension At goal. - amlodipine-olmesartan (AZOR) 10-20 MG  tablet; Take 1 tablet by mouth daily.  Dispense: 90 tablet; Refill: 3  2. Risk for falls -Patient advised that she is at high risk for more falls having fallen twice in the past 7 weeks.  With that comes the risks of bleeding given that she  is on Xarelto.  I explained all of this to patient and recommended home physical therapy.  Patient declined.  However she will call me back if she changes her mind. -Advised using her standard walker for better stability instead of the cane  3. Osteoarthritis of both knees, unspecified osteoarthritis type -Continue Voltaren gel. I also recommended getting some Tylenol 500 mg OTC and using 3 times a day as needed  4. Prediabetes Continue metformin  5. Atrial flutter, unspecified type (HCC) Xarelto was recently placed on [facility] list for increased risks of GI bleeding for patients over the age of 15.  Eliquis recommended; lower risk of GI bleed.  I recommend changing from Xarelto to Eliquis however patient declined stating she prefers the once a day dosing of Xarelto  Patient was given the opportunity to ask questions.  Patient verbalized understanding of the plan and was able to repeat key elements of the plan.   No orders of the defined types were placed in this encounter.    Requested Prescriptions   Signed Prescriptions Disp Refills  . amlodipine-olmesartan (AZOR) 10-20 MG tablet 90 tablet 3    Sig: Take 1 tablet by mouth daily.    Return in about 3 months (around 03/17/2018).  Jonah Blue, MD, FACP

## 2017-12-18 NOTE — Patient Instructions (Addendum)
Let me know if you change your mind about wanting to have home physical therapy or refer you out for physical therapy.  I recommend using your walker.  Remember that you are on a blood thinner and can have significant bleeding with dangerous falls.  Fall Prevention in the Home Falls can cause injuries. They can happen to people of all ages. There are many things you can do to make your home safe and to help prevent falls. What can I do on the outside of my home?  Regularly fix the edges of walkways and driveways and fix any cracks.  Remove anything that might make you trip as you walk through a door, such as a raised step or threshold.  Trim any bushes or trees on the path to your home.  Use bright outdoor lighting.  Clear any walking paths of anything that might make someone trip, such as rocks or tools.  Regularly check to see if handrails are loose or broken. Make sure that both sides of any steps have handrails.  Any raised decks and porches should have guardrails on the edges.  Have any leaves, snow, or ice cleared regularly.  Use sand or salt on walking paths during winter.  Clean up any spills in your garage right away. This includes oil or grease spills. What can I do in the bathroom?  Use night lights.  Install grab bars by the toilet and in the tub and shower. Do not use towel bars as grab bars.  Use non-skid mats or decals in the tub or shower.  If you need to sit down in the shower, use a plastic, non-slip stool.  Keep the floor dry. Clean up any water that spills on the floor as soon as it happens.  Remove soap buildup in the tub or shower regularly.  Attach bath mats securely with double-sided non-slip rug tape.  Do not have throw rugs and other things on the floor that can make you trip. What can I do in the bedroom?  Use night lights.  Make sure that you have a light by your bed that is easy to reach.  Do not use any sheets or blankets that are too big  for your bed. They should not hang down onto the floor.  Have a firm chair that has side arms. You can use this for support while you get dressed.  Do not have throw rugs and other things on the floor that can make you trip. What can I do in the kitchen?  Clean up any spills right away.  Avoid walking on wet floors.  Keep items that you use a lot in easy-to-reach places.  If you need to reach something above you, use a strong step stool that has a grab bar.  Keep electrical cords out of the way.  Do not use floor polish or wax that makes floors slippery. If you must use wax, use non-skid floor wax.  Do not have throw rugs and other things on the floor that can make you trip. What can I do with my stairs?  Do not leave any items on the stairs.  Make sure that there are handrails on both sides of the stairs and use them. Fix handrails that are broken or loose. Make sure that handrails are as long as the stairways.  Check any carpeting to make sure that it is firmly attached to the stairs. Fix any carpet that is loose or worn.  Avoid having throw rugs  at the top or bottom of the stairs. If you do have throw rugs, attach them to the floor with carpet tape.  Make sure that you have a light switch at the top of the stairs and the bottom of the stairs. If you do not have them, ask someone to add them for you. What else can I do to help prevent falls?  Wear shoes that: ? Do not have high heels. ? Have rubber bottoms. ? Are comfortable and fit you well. ? Are closed at the toe. Do not wear sandals.  If you use a stepladder: ? Make sure that it is fully opened. Do not climb a closed stepladder. ? Make sure that both sides of the stepladder are locked into place. ? Ask someone to hold it for you, if possible.  Clearly mark and make sure that you can see: ? Any grab bars or handrails. ? First and last steps. ? Where the edge of each step is.  Use tools that help you move around  (mobility aids) if they are needed. These include: ? Canes. ? Walkers. ? Scooters. ? Crutches.  Turn on the lights when you go into a dark area. Replace any light bulbs as soon as they burn out.  Set up your furniture so you have a clear path. Avoid moving your furniture around.  If any of your floors are uneven, fix them.  If there are any pets around you, be aware of where they are.  Review your medicines with your doctor. Some medicines can make you feel dizzy. This can increase your chance of falling. Ask your doctor what other things that you can do to help prevent falls. This information is not intended to replace advice given to you by your health care provider. Make sure you discuss any questions you have with your health care provider. Document Released: 08/10/2009 Document Revised: 03/21/2016 Document Reviewed: 11/18/2014 Elsevier Interactive Patient Education  [employer].

## 2017-12-22 ENCOUNTER — Other Ambulatory Visit: Payer: Self-pay | Admitting: Internal Medicine

## 2017-12-22 DIAGNOSIS — M170 Bilateral primary osteoarthritis of knee: Secondary | ICD-10-CM

## 2018-01-07 ENCOUNTER — Other Ambulatory Visit: Payer: Self-pay | Admitting: Internal Medicine

## 2018-01-16 ENCOUNTER — Other Ambulatory Visit: Payer: Self-pay | Admitting: Internal Medicine

## 2018-01-16 DIAGNOSIS — M170 Bilateral primary osteoarthritis of knee: Secondary | ICD-10-CM

## 2018-02-04 ENCOUNTER — Ambulatory Visit (INDEPENDENT_AMBULATORY_CARE_PROVIDER_SITE_OTHER): Payer: Medicare Other | Admitting: Cardiology

## 2018-02-04 ENCOUNTER — Encounter (INDEPENDENT_AMBULATORY_CARE_PROVIDER_SITE_OTHER): Payer: Self-pay

## 2018-02-04 ENCOUNTER — Encounter: Payer: Self-pay | Admitting: Cardiology

## 2018-02-04 VITALS — BP 136/58 | HR 54 | Wt 194.4 lb

## 2018-02-04 DIAGNOSIS — I10 Essential (primary) hypertension: Secondary | ICD-10-CM | POA: Diagnosis not present

## 2018-02-04 DIAGNOSIS — I482 Chronic atrial fibrillation: Secondary | ICD-10-CM | POA: Diagnosis not present

## 2018-02-04 DIAGNOSIS — I340 Nonrheumatic mitral (valve) insufficiency: Secondary | ICD-10-CM | POA: Diagnosis not present

## 2018-02-04 DIAGNOSIS — I4821 Permanent atrial fibrillation: Secondary | ICD-10-CM

## 2018-02-04 LAB — CBC
HEMOGLOBIN: 11.1 g/dL (ref 11.1–15.9)
Hematocrit: 33.9 % — ABNORMAL LOW (ref 34.0–46.6)
MCH: 28.2 pg (ref 26.6–33.0)
MCHC: 32.7 g/dL (ref 31.5–35.7)
MCV: 86 fL (ref 79–97)
Platelets: 239 10*3/uL (ref 150–379)
RBC: 3.94 x10E6/uL (ref 3.77–5.28)
RDW: 14.8 % (ref 12.3–15.4)
WBC: 5.2 10*3/uL (ref 3.4–10.8)

## 2018-02-04 LAB — BASIC METABOLIC PANEL
BUN / CREAT RATIO: 15 (ref 12–28)
BUN: 16 mg/dL (ref 8–27)
CALCIUM: 10.1 mg/dL (ref 8.7–10.3)
CO2: 25 mmol/L (ref 20–29)
Chloride: 99 mmol/L (ref 96–106)
Creatinine, Ser: 1.04 mg/dL — ABNORMAL HIGH (ref 0.57–1.00)
GFR, EST AFRICAN AMERICAN: 59 mL/min/{1.73_m2} — AB (ref >59)
GFR, EST NON AFRICAN AMERICAN: 51 mL/min/{1.73_m2} — AB (ref >59)
Glucose: 104 mg/dL — ABNORMAL HIGH (ref 65–99)
POTASSIUM: 5 mmol/L (ref 3.5–5.2)
Sodium: 139 mmol/L (ref 134–144)

## 2018-02-04 NOTE — Patient Instructions (Signed)
Medication Instructions:  Your physician recommends that you continue on your current medications as directed. Please refer to the Current Medication list given to you today.  Labwork: Today for kidney function and complete blood count  Testing/Procedures: Your physician has requested that you have an echocardiogram. Echocardiography is a painless test that uses sound waves to create images of your heart. It provides your doctor with information about the size and shape of your heart and how well your heart's chambers and valves are working. This procedure takes approximately one hour. There are no restrictions for this procedure.   Follow-Up: Your physician wants you to follow-up in: 1 year with Dr. Sherlyn Lick will receive a reminder letter in the mail two months in advance. If you don't receive a letter, please call our office to schedule the follow-up appointment.  Any Other Special Instructions Will Be Listed Below (If Applicable).    Thank you for choosing [facility]    Lyda Perone, RN  [phone]  If you need a refill on your cardiac medications before your next appointment, please call your pharmacy.

## 2018-02-04 NOTE — Progress Notes (Signed)
Cardiology Office Note:    Date:  02/04/2018   ID:  Anna [name], DOB [birth year], MRN [id number]  PCP:  Marcine Matar, MD  Cardiologist:  No primary care provider on file.    Referring MD: Marcine Matar, MD   Chief Complaint  Patient presents with  . Atrial Fibrillation  . Hypertension    History of Present Illness:    Anna [name] is a 82 y.o. female with a hx of HTN, permanent  atrial fibrillation on Xarelto due to her CHADS2VASC score of 3, mild  MR by echo.  She is here today for followup and is doing well.  She denies any chest pain or pressure, SOB, DOE, PND, orthopnea, LE edema, dizziness, palpitations or syncope. She is compliant with her meds and is tolerating meds with no SE.    Past Medical History:  Diagnosis Date  . Hyperlipidemia   . Hypertension   . Osteoarthritis   . Overweight(278.02)   . Permanent atrial fibrillation [facility])     Past Surgical History:  Procedure Laterality Date  . CARDIOVERSION N/A 03/24/2014   Procedure: CARDIOVERSION;  Surgeon: Quintella Reichert, MD;  Location: [facility];  Service: Cardiovascular;  Laterality: N/A;    Current Medications: Current Meds  Medication Sig  . amlodipine-olmesartan (AZOR) 10-20 MG tablet Take 1 tablet by mouth daily.  . diclofenac sodium (VOLTAREN) 1 % GEL APPLY 2 GRAMS EXTERNALLY TO THE AFFECTED AREA FOUR TIMES DAILY  . docusate sodium (COLACE) 100 MG capsule Take 100 mg by mouth daily.   Marland Kitchen gabapentin (NEURONTIN) 100 MG capsule TAKE 1 CAPSULE(100 MG) BY MOUTH THREE TIMES DAILY  . metFORMIN (GLUCOPHAGE) 500 MG tablet Take 1 tablet (500 mg total) by mouth daily with breakfast.  . Multiple Vitamins-Minerals (CENTRUM SILVER PO) Take 1 tablet by mouth daily.   . Omega-3 Fatty Acids (FISH OIL CONCENTRATE PO) Take 1 capsule by mouth daily.   . rivaroxaban (XARELTO) 20 MG TABS tablet Take 1 tablet (20 mg total) by mouth daily with supper.  . rosuvastatin (CRESTOR) 10 MG tablet TAKE 1 TABLET(10 MG) BY  MOUTH DAILY     Allergies:   Patient has no known allergies.   Social History   Socioeconomic History  . Marital status: Divorced    Spouse name: Not on file  . Number of children: Not on file  . Years of education: Not on file  . Highest education level: Not on file  Occupational History  . Not on file  Social Needs  . Financial resource strain: Not on file  . Food insecurity:    Worry: Not on file    Inability: Not on file  . Transportation needs:    Medical: Not on file    Non-medical: Not on file  Tobacco Use  . Smoking status: Never Smoker  . Smokeless tobacco: Never Used  Substance and Sexual Activity  . Alcohol use: No  . Drug use: No  . Sexual activity: Not on file  Lifestyle  . Physical activity:    Days per week: Not on file    Minutes per session: Not on file  . Stress: Not on file  Relationships  . Social connections:    Talks on phone: Not on file    Gets together: Not on file    Attends religious service: Not on file    Active member of club or organization: Not on file    Attends meetings of clubs or organizations: Not on file  Relationship status: Not on file  Other Topics Concern  . Not on file  Social History Narrative  . Not on file     Family History: The patient's family history includes Alzheimer's disease in her mother.  ROS:   Please see the history of present illness.    ROS  All other systems reviewed and negative.   EKGs/Labs/Other Studies Reviewed:    The following studies were reviewed today: none  EKG:  EKG is  ordered today.  The ekg ordered today demonstrates atrial flutter with variable AV block with slow ventricular response at 54 bpm with LVH with QRS widening and repolarization abnormality.  Recent Labs: 09/16/2017: ALT 9; BUN 13; Creatinine, Ser 0.92; Hemoglobin 12.1; Platelets 226; Potassium 5.1; Sodium 143   Recent Lipid Panel    Component Value Date/Time   CHOL 98 (L) 06/29/2015 0945   TRIG 72 06/29/2015  0945   HDL 51 06/29/2015 0945   CHOLHDL 1.9 06/29/2015 0945   VLDL 14 06/29/2015 0945   LDLCALC 33 06/29/2015 0945    Physical Exam:    VS:  BP (!) 136/58   Pulse (!) 54   Wt 194 lb 6.4 oz (88.2 kg)   SpO2 98%   BMI 33.37 kg/m     Wt Readings from Last 3 Encounters:  02/04/18 194 lb 6.4 oz (88.2 kg)  12/18/17 194 lb 9.6 oz (88.3 kg)  09/16/17 196 lb (88.9 kg)     GEN:  Well nourished, well developed in no acute distress HEENT: Normal NECK: No JVD; No carotid bruits LYMPHATICS: No lymphadenopathy CARDIAC: Irregularly irregular, no murmurs, rubs, gallops RESPIRATORY:  Clear to auscultation without rales, wheezing or rhonchi  ABDOMEN: Soft, non-tender, non-distended MUSCULOSKELETAL:  No edema; No deformity  SKIN: Warm and dry NEUROLOGIC:  Alert and oriented x 3 PSYCHIATRIC:  Normal affect   ASSESSMENT:    1. Permanent atrial fibrillation (HCC)   2. Essential hypertension, benign   3. Non-rheumatic mitral regurgitation    PLAN:    In order of problems listed above:  1.  Permanent atrial fibrillation  -heart rate is well controlled on exam today.  We will continue on Xarelto 20 mg daily.  We will check a bmet and CBC today.  She has not had any bleeding problems.  2.  HTN - blood pressure is well controlled on exam today.  She will continue on Azor 10-20 mg daily.  3.  Trivial MR by echo 2017 but I hear a loud murmur today on exam.  I will repeat a 2D echocardiogram to make sure mitral regurgitation has not worsened.  Medication Adjustments/Labs and Tests Ordered: Current medicines are reviewed at length with the patient today.  Concerns regarding medicines are outlined above.  Orders Placed This Encounter  Procedures  . EKG 12-Lead   No orders of the defined types were placed in this encounter.   Signed, Armanda Magic, MD  02/04/2018 11:06 AM    [facility]

## 2018-02-11 ENCOUNTER — Other Ambulatory Visit: Payer: Self-pay

## 2018-02-11 ENCOUNTER — Ambulatory Visit (HOSPITAL_COMMUNITY): Payer: Medicare Other | Attending: Cardiology

## 2018-02-11 DIAGNOSIS — I4891 Unspecified atrial fibrillation: Secondary | ICD-10-CM | POA: Insufficient documentation

## 2018-02-11 DIAGNOSIS — E669 Obesity, unspecified: Secondary | ICD-10-CM | POA: Insufficient documentation

## 2018-02-11 DIAGNOSIS — I340 Nonrheumatic mitral (valve) insufficiency: Secondary | ICD-10-CM | POA: Insufficient documentation

## 2018-02-11 DIAGNOSIS — E785 Hyperlipidemia, unspecified: Secondary | ICD-10-CM | POA: Diagnosis not present

## 2018-02-11 DIAGNOSIS — I517 Cardiomegaly: Secondary | ICD-10-CM | POA: Diagnosis not present

## 2018-02-25 ENCOUNTER — Other Ambulatory Visit (INDEPENDENT_AMBULATORY_CARE_PROVIDER_SITE_OTHER): Payer: Self-pay

## 2018-02-25 MED ORDER — METFORMIN HCL 500 MG PO TABS: 500.0000 mg | ORAL_TABLET | Freq: Every day | ORAL | 3 refills | Status: DC

## 2018-02-27 ENCOUNTER — Other Ambulatory Visit: Payer: Self-pay

## 2018-02-27 MED ORDER — METFORMIN HCL 500 MG PO TABS: 500.0000 mg | ORAL_TABLET | Freq: Every day | ORAL | 3 refills | Status: DC

## 2018-03-02 ENCOUNTER — Other Ambulatory Visit: Payer: Self-pay

## 2018-03-02 DIAGNOSIS — M170 Bilateral primary osteoarthritis of knee: Secondary | ICD-10-CM

## 2018-03-02 MED ORDER — GABAPENTIN 100 MG PO CAPS: ORAL_CAPSULE | ORAL | 2 refills | Status: DC

## 2018-03-06 ENCOUNTER — Ambulatory Visit: Payer: Medicare Other | Admitting: Internal Medicine

## 2018-03-09 ENCOUNTER — Encounter: Payer: Self-pay | Admitting: Internal Medicine

## 2018-03-09 ENCOUNTER — Ambulatory Visit: Payer: Medicare Other | Attending: Internal Medicine | Admitting: Internal Medicine

## 2018-03-09 VITALS — BP 140/80 | HR 50 | Temp 97.7°F | Resp 16 | Wt 195.4 lb

## 2018-03-09 DIAGNOSIS — N3946 Mixed incontinence: Secondary | ICD-10-CM | POA: Diagnosis not present

## 2018-03-09 DIAGNOSIS — I482 Chronic atrial fibrillation, unspecified: Secondary | ICD-10-CM

## 2018-03-09 DIAGNOSIS — I10 Essential (primary) hypertension: Secondary | ICD-10-CM

## 2018-03-09 DIAGNOSIS — E785 Hyperlipidemia, unspecified: Secondary | ICD-10-CM | POA: Diagnosis not present

## 2018-03-09 DIAGNOSIS — Z7984 Long term (current) use of oral hypoglycemic drugs: Secondary | ICD-10-CM | POA: Insufficient documentation

## 2018-03-09 DIAGNOSIS — M170 Bilateral primary osteoarthritis of knee: Secondary | ICD-10-CM

## 2018-03-09 DIAGNOSIS — I4892 Unspecified atrial flutter: Secondary | ICD-10-CM | POA: Diagnosis not present

## 2018-03-09 DIAGNOSIS — R001 Bradycardia, unspecified: Secondary | ICD-10-CM | POA: Insufficient documentation

## 2018-03-09 DIAGNOSIS — R7303 Prediabetes: Secondary | ICD-10-CM

## 2018-03-09 DIAGNOSIS — Z7901 Long term (current) use of anticoagulants: Secondary | ICD-10-CM | POA: Diagnosis not present

## 2018-03-09 DIAGNOSIS — M179 Osteoarthritis of knee, unspecified: Secondary | ICD-10-CM | POA: Insufficient documentation

## 2018-03-09 DIAGNOSIS — I340 Nonrheumatic mitral (valve) insufficiency: Secondary | ICD-10-CM | POA: Insufficient documentation

## 2018-03-09 DIAGNOSIS — Z79899 Other long term (current) drug therapy: Secondary | ICD-10-CM | POA: Diagnosis not present

## 2018-03-09 DIAGNOSIS — M1710 Unilateral primary osteoarthritis, unspecified knee: Secondary | ICD-10-CM | POA: Insufficient documentation

## 2018-03-09 DIAGNOSIS — Z820 Family history of epilepsy and other diseases of the nervous system: Secondary | ICD-10-CM | POA: Diagnosis not present

## 2018-03-09 LAB — GLUCOSE, POCT (MANUAL RESULT ENTRY): POC GLUCOSE: 98 mg/dL (ref 70–99)

## 2018-03-09 LAB — POCT GLYCOSYLATED HEMOGLOBIN (HGB A1C): Hemoglobin A1C: 5.9

## 2018-03-09 NOTE — Patient Instructions (Signed)
I will have our case worker call you to see if you are interested in setting up Meals on Wheels.

## 2018-03-09 NOTE — Progress Notes (Signed)
Patient ID: Anna [name], female    DOB: 26-Mar-1936  MRN: [id number]  CC: Diabetes and Hypertension   Subjective: Anna [name] is a 82 y.o. female who presents for chronic ds Her concerns today include:  82 year old female with history of atrial flutter on Xarelto with history of cardioversion 2015, HTN, trivial AR, mild MR,OA knees, HL, prediabetes.  No falls since last visit.  Using walker more.  Good appetite.  Usually has cereal for BF, sandwich for lunch and then cereal for dinner.  Cannot do much standing to cook a hot meal Sleeping ok. Starting to have some incontinence of urine.  When she gets the urge to urinate she as to go. Some leakage with coughing/sneezing. Wears pads.  HTN/atrial flutter:  Checks BP at home 2 x a mth.  Runs good. Limits salt in foods. No palpitations, SOB, CP.  Some LE and pedal edema if she stands too long.  Goes away with elevation of feet Saw cardiologist Dr. Mayford Knife last month.  Had echo that revealed EF of 60-65%, trivial AR and mild MR. no bruising on Xarelto Patient Active Problem List   Diagnosis Date Noted  . Mixed incontinence urge and stress 03/09/2018  . Sinus bradycardia 12/18/2016  . Estrogen deficiency 07/03/2016  . Arthritis, senescent 03/10/2015  . Mitral regurgitation 03/01/2014  . Atrial flutter (HCC) 03/01/2014  . Permanent atrial fibrillation (HCC) 12/24/2013  . Essential hypertension, benign 12/24/2013  . Heart murmur 12/24/2013  . Hyperlipemia 12/24/2013     Current Outpatient Medications on File Prior to Visit  Medication Sig Dispense Refill  . amlodipine-olmesartan (AZOR) 10-20 MG tablet Take 1 tablet by mouth daily. 90 tablet 3  . diclofenac sodium (VOLTAREN) 1 % GEL APPLY 2 GRAMS EXTERNALLY TO THE AFFECTED AREA FOUR TIMES DAILY 400 g 2  . docusate sodium (COLACE) 100 MG capsule Take 100 mg by mouth daily.     Marland Kitchen gabapentin (NEURONTIN) 100 MG capsule TAKE 1 CAPSULE(100 MG) BY MOUTH THREE TIMES DAILY 90 capsule 2  .  metFORMIN (GLUCOPHAGE) 500 MG tablet Take 1 tablet (500 mg total) by mouth daily with breakfast. 90 tablet 3  . Multiple Vitamins-Minerals (CENTRUM SILVER PO) Take 1 tablet by mouth daily.     . Omega-3 Fatty Acids (FISH OIL CONCENTRATE PO) Take 1 capsule by mouth daily.     . rivaroxaban (XARELTO) 20 MG TABS tablet Take 1 tablet (20 mg total) by mouth daily with supper. 90 tablet 3  . rosuvastatin (CRESTOR) 10 MG tablet TAKE 1 TABLET(10 MG) BY MOUTH DAILY 90 tablet 0   No current facility-administered medications on file prior to visit.     No Known Allergies  Social History   Socioeconomic History  . Marital status: Divorced    Spouse name: Not on file  . Number of children: Not on file  . Years of education: Not on file  . Highest education level: Not on file  Occupational History  . Not on file  Social Needs  . Financial resource strain: Not on file  . Food insecurity:    Worry: Not on file    Inability: Not on file  . Transportation needs:    Medical: Not on file    Non-medical: Not on file  Tobacco Use  . Smoking status: Never Smoker  . Smokeless tobacco: Never Used  Substance and Sexual Activity  . Alcohol use: No  . Drug use: No  . Sexual activity: Not on file  Lifestyle  .  Physical activity:    Days per week: Not on file    Minutes per session: Not on file  . Stress: Not on file  Relationships  . Social connections:    Talks on phone: Not on file    Gets together: Not on file    Attends religious service: Not on file    Active member of club or organization: Not on file    Attends meetings of clubs or organizations: Not on file    Relationship status: Not on file  . Intimate partner violence:    Fear of current or ex partner: Not on file    Emotionally abused: Not on file    Physically abused: Not on file    Forced sexual activity: Not on file  Other Topics Concern  . Not on file  Social History Narrative  . Not on file    Family History  Problem  Relation Age of Onset  . Alzheimer's disease Mother     Past Surgical History:  Procedure Laterality Date  . CARDIOVERSION N/A 03/24/2014   Procedure: CARDIOVERSION;  Surgeon: Quintella Reichert, MD;  Location: [facility];  Service: Cardiovascular;  Laterality: N/A;    ROS: Review of Systems Negative except as above PHYSICAL EXAM: BP 140/80   Pulse (!) 50   Temp 97.7 F (36.5 C) (Oral)   Resp 16   Wt 195 lb 6.4 oz (88.6 kg)   SpO2 98%   BMI 33.54 kg/m   Wt Readings from Last 3 Encounters:  03/09/18 195 lb 6.4 oz (88.6 kg)  02/04/18 194 lb 6.4 oz (88.2 kg)  12/18/17 194 lb 9.6 oz (88.3 kg)   Repeat BP 140/80 Physical Exam  General appearance - alert, well appearing, elderly Caucasian female and in no distress Mental status - alert, oriented to person, place, and time, normal mood, behavior, speech, dress, motor activity, and thought processes Mouth - mucous membranes moist, pharynx normal without lesions Neck - supple, no significant adenopathy Chest - clear to auscultation, no wheezes, rales or rhonchi, symmetric air entry Heart - irregularly irregular heart beat, rate control, no Gallops Extremities - trace LE edema MSK: Patient ambulates with a cane  Results for orders placed or performed in visit on 02/04/18  Basic metabolic panel  Result Value Ref Range   Glucose 104 (H) 65 - 99 mg/dL   BUN 16 8 - 27 mg/dL   Creatinine, Ser 2.84 (H) 0.57 - 1.00 mg/dL   GFR calc non Af Amer 51 (L) >59 mL/min/1.73   GFR calc Af Amer 59 (L) >59 mL/min/1.73   BUN/Creatinine Ratio 15 12 - 28   Sodium 139 134 - 144 mmol/L   Potassium 5.0 3.5 - 5.2 mmol/L   Chloride 99 96 - 106 mmol/L   CO2 25 20 - 29 mmol/L   Calcium 10.1 8.7 - 10.3 mg/dL  CBC  Result Value Ref Range   WBC 5.2 3.4 - 10.8 x10E3/uL   RBC 3.94 3.77 - 5.28 x10E6/uL   Hemoglobin 11.1 11.1 - 15.9 g/dL   Hematocrit 13.2 (L) 44.0 - 46.6 %   MCV 86 79 - 97 fL   MCH 28.2 26.6 - 33.0 pg   MCHC 32.7 31.5 - 35.7 g/dL    RDW 10.2 72.5 - 36.6 %   Platelets 239 150 - 379 x10E3/uL   BS 98  A1C 5.9  ASSESSMENT AND PLAN: 1. Essential hypertension Repeat blood pressure is acceptable.  Continue Azor low-salt diet  2. Osteoarthritis of  both knees, unspecified osteoarthritis type -I recommend getting her 3 meals a day with at least one being a hot meal.  Since she is not able to do much standing to cook supper I recommend trying Meals on Wheels.  Patient is agreeable to discussing the details with our case manager  3. Chronic atrial fibrillation (HCC) On Xarelto.  Followed by cardiology  4. Mixed incontinence urge and stress -Recommend glucose exercises. Also recommend starting Vesicare but patient wants to hold off on taking any medications  5. Prediabetes Doing well on metformin.  A1c has come down.  We will keep an eye on GFR that is currently at 13  Patient was given the opportunity to ask questions.  Patient verbalized understanding of the plan and was able to repeat key elements of the plan.   No orders of the defined types were placed in this encounter.    Requested Prescriptions    No prescriptions requested or ordered in this encounter    Return in about 4 months (around 07/10/2018).  Jonah Blue, MD, FACP

## 2018-03-10 ENCOUNTER — Telehealth: Payer: Self-pay | Admitting: Internal Medicine

## 2018-03-10 NOTE — Telephone Encounter (Signed)
Call placed to patient #[phone], regarding food services. Spoke with patient and explained to her of the [employer] on Wheels #[phone] as well as the blue and green meal booklets that we have in the office. Patient understood and stated that she would contact Me[employer] Whe[employer].   During our conversation patient stated that she lives alone and has her own car. She drives herself around, therefore she didn't need information about [employer]. Also informed patient of PCS services and patient stated that she's not interested at the moment because she can still do things by herself. No further questions or concerns.

## 2018-04-14 ENCOUNTER — Other Ambulatory Visit: Payer: Self-pay | Admitting: Internal Medicine

## 2018-04-21 ENCOUNTER — Other Ambulatory Visit: Payer: Self-pay | Admitting: Internal Medicine

## 2018-04-21 DIAGNOSIS — M170 Bilateral primary osteoarthritis of knee: Secondary | ICD-10-CM

## 2018-05-22 ENCOUNTER — Other Ambulatory Visit: Payer: Self-pay | Admitting: Internal Medicine

## 2018-06-18 ENCOUNTER — Other Ambulatory Visit: Payer: Self-pay | Admitting: Internal Medicine

## 2018-06-18 DIAGNOSIS — M170 Bilateral primary osteoarthritis of knee: Secondary | ICD-10-CM

## 2018-07-02 ENCOUNTER — Telehealth: Payer: Self-pay | Admitting: Internal Medicine

## 2018-07-02 NOTE — Telephone Encounter (Signed)
1) Medication(s) Requested (by name): gabapentin (NEURONTIN) 100 MG capsule [[id number]]   2) Pharmacy of Choice: [facility] [address]   3) Special Requests: Patient has apointment 07/10/2018

## 2018-07-10 ENCOUNTER — Encounter: Payer: Self-pay | Admitting: Internal Medicine

## 2018-07-10 ENCOUNTER — Ambulatory Visit: Payer: Medicare Other | Attending: Internal Medicine | Admitting: Internal Medicine

## 2018-07-10 VITALS — BP 144/54 | HR 65 | Temp 98.4°F | Resp 16 | Wt 190.4 lb

## 2018-07-10 DIAGNOSIS — R269 Unspecified abnormalities of gait and mobility: Secondary | ICD-10-CM | POA: Diagnosis not present

## 2018-07-10 DIAGNOSIS — Z9181 History of falling: Secondary | ICD-10-CM | POA: Diagnosis not present

## 2018-07-10 DIAGNOSIS — E785 Hyperlipidemia, unspecified: Secondary | ICD-10-CM | POA: Insufficient documentation

## 2018-07-10 DIAGNOSIS — I10 Essential (primary) hypertension: Secondary | ICD-10-CM | POA: Diagnosis not present

## 2018-07-10 DIAGNOSIS — M170 Bilateral primary osteoarthritis of knee: Secondary | ICD-10-CM

## 2018-07-10 DIAGNOSIS — W19XXXA Unspecified fall, initial encounter: Secondary | ICD-10-CM | POA: Insufficient documentation

## 2018-07-10 DIAGNOSIS — Z7901 Long term (current) use of anticoagulants: Secondary | ICD-10-CM

## 2018-07-10 DIAGNOSIS — R7303 Prediabetes: Secondary | ICD-10-CM

## 2018-07-10 DIAGNOSIS — I4892 Unspecified atrial flutter: Secondary | ICD-10-CM | POA: Diagnosis not present

## 2018-07-10 DIAGNOSIS — D649 Anemia, unspecified: Secondary | ICD-10-CM

## 2018-07-10 LAB — GLUCOSE, POCT (MANUAL RESULT ENTRY): POC Glucose: 102 mg/dl — AB (ref 70–99)

## 2018-07-10 MED ORDER — AMLODIPINE-OLMESARTAN 10-20 MG PO TABS: 1.0000 | ORAL_TABLET | Freq: Every day | ORAL | 3 refills | Status: DC

## 2018-07-10 MED ORDER — RIVAROXABAN 20 MG PO TABS: 20.0000 mg | ORAL_TABLET | Freq: Every day | ORAL | 3 refills | Status: DC

## 2018-07-10 MED ORDER — ROSUVASTATIN CALCIUM 10 MG PO TABS: ORAL_TABLET | ORAL | 11 refills | Status: DC

## 2018-07-10 MED ORDER — METFORMIN HCL 500 MG PO TABS: 500.0000 mg | ORAL_TABLET | Freq: Every day | ORAL | 3 refills | Status: DC

## 2018-07-10 MED ORDER — GABAPENTIN 100 MG PO CAPS: ORAL_CAPSULE | ORAL | 6 refills | Status: DC

## 2018-07-10 NOTE — Progress Notes (Signed)
Patient ID: Anna [name], female    DOB: 1936-09-22  MRN: [id number]  CC: Diabetes (pre-diabetes) and Hypertension   Subjective: Anna [name] is a 82 y.o. female who presents for chronic disease management Her concerns today include: 82 year old female with history of atrial flutter on Xarelto with history of cardioversion 2015, HTN, trivial AR, mild MR,OA knees, HL, prediabetes.  Patient complains of having a fall at home about a week ago.  She was using her cane but her left knee gave out and she fell backwards hitting her head on a glass table.  She had swelling on the left parietal occipital area but no bleeding.  She was not seen in urgent care or the emergency room for this.  She was able to get up herself.  She denies any loss of consciousness.  Denies any headaches, dizziness, blurred vision or nausea/vomiting since then.  Patient lives alone. She declined Meals on Wheels and PCS services on discussion in 02/2018 with her caseworker.  She complains of having an increasingly difficult time trying to stand to do any type of cooking anymore due to her knees.  She has knee braces that she wears mostly when going outside the house.   -Needing refill on gabapentin which she finds helpful for her knee and other joint pains.  A.flutter: She is on Xarelto.  No bruising or bleeding.  HM: Had flu shot today at an outside pharmacy. Patient Active Problem List   Diagnosis Date Noted  . Mixed incontinence urge and stress 03/09/2018  . Prediabetes 03/09/2018  . OA (osteoarthritis) of knee 03/09/2018  . Sinus bradycardia 12/18/2016  . Estrogen deficiency 07/03/2016  . Mitral regurgitation 03/01/2014  . Atrial flutter (HCC) 03/01/2014  . Permanent atrial fibrillation (HCC) 12/24/2013  . Essential hypertension, benign 12/24/2013  . Heart murmur 12/24/2013  . Hyperlipemia 12/24/2013     Current Outpatient Medications on File Prior to Visit  Medication Sig Dispense Refill  . diclofenac  sodium (VOLTAREN) 1 % GEL APPLY 2 GRAMS EXTERNALLY TO THE AFFECTED AREA FOUR TIMES DAILY 400 g 0  . docusate sodium (COLACE) 100 MG capsule Take 100 mg by mouth daily.     . Multiple Vitamins-Minerals (CENTRUM SILVER PO) Take 1 tablet by mouth daily.     . Omega-3 Fatty Acids (FISH OIL CONCENTRATE PO) Take 1 capsule by mouth daily.      No current facility-administered medications on file prior to visit.     No Known Allergies  Social History   Socioeconomic History  . Marital status: Divorced    Spouse name: Not on file  . Number of children: Not on file  . Years of education: Not on file  . Highest education level: Not on file  Occupational History  . Not on file  Social Needs  . Financial resource strain: Not on file  . Food insecurity:    Worry: Not on file    Inability: Not on file  . Transportation needs:    Medical: Not on file    Non-medical: Not on file  Tobacco Use  . Smoking status: Never Smoker  . Smokeless tobacco: Never Used  Substance and Sexual Activity  . Alcohol use: No  . Drug use: No  . Sexual activity: Not on file  Lifestyle  . Physical activity:    Days per week: Not on file    Minutes per session: Not on file  . Stress: Not on file  Relationships  . Social connections:  Talks on phone: Not on file    Gets together: Not on file    Attends religious service: Not on file    Active member of club or organization: Not on file    Attends meetings of clubs or organizations: Not on file    Relationship status: Not on file  . Intimate partner violence:    Fear of current or ex partner: Not on file    Emotionally abused: Not on file    Physically abused: Not on file    Forced sexual activity: Not on file  Other Topics Concern  . Not on file  Social History Narrative  . Not on file    Family History  Problem Relation Age of Onset  . Alzheimer's disease Mother     Past Surgical History:  Procedure Laterality Date  . CARDIOVERSION N/A  03/24/2014   Procedure: CARDIOVERSION;  Surgeon: Quintella Reichert, MD;  Location: [facility];  Service: Cardiovascular;  Laterality: N/A;    ROS: Review of Systems Negative except as above. PHYSICAL EXAM: BP (!) 144/54   Pulse 65   Temp 98.4 F (36.9 C) (Oral)   Resp 16   Wt 190 lb 6.4 oz (86.4 kg)   SpO2 97%   BMI 32.68 kg/m   Wt Readings from Last 3 Encounters:  07/10/18 190 lb 6.4 oz (86.4 kg)  03/09/18 195 lb 6.4 oz (88.6 kg)  02/04/18 194 lb 6.4 oz (88.2 kg)   BP 140/60 Physical Exam  General appearance - alert, well appearing, elderly Caucasian female and in no distress.  Clothing is clean. Mental status -patient is alert and oriented.  She answers questions appropriately. Mouth - mucous membranes moist, pharynx normal without lesions Neck - supple, no significant adenopathy Chest - clear to auscultation, no wheezes, rales or rhonchi, symmetric air entry Heart -regular rate and rhythm with a 2 out of 6 systolic ejection murmur right upper sternal border Neurological -grip 4+ out of 5 bilaterally.  Power upper extremities 4+/5 bilaterally.  Power lower extremities 4+/5 bilaterally.  Patient ambulates with a Rollator walker today.  She has a low foot floor clearance and dry legs the left foot a little. Musculoskeletal -knees: Positive joint enlargement bilaterally.  Moderate discomfort with passive range of motion of the right knee. Extremities -trace lower extremity edema.  Results for orders placed or performed in visit on 07/10/18  POCT glucose (manual entry)  Result Value Ref Range   POC Glucose 102 (A) 70 - 99 mg/dl    ASSESSMENT AND PLAN: 1. History of recent fall 2. Gait disturbance If falls become a continuing issue, we may need to reconsider the risk of keeping her on Xarelto.  Patient declines having a CT scan of the head. I recommend home physical therapy.  Patient declines this stating that she does not want anyone coming to her house.  She also declines  driving to do outpatient physical therapy.  Advised that she considers getting a life alert. -Patient also declines PCS services. 3. Osteoarthritis of both knees, unspecified osteoarthritis type Declines Ortho referral.  Encouraged her to use her Rollator walker more so than her cane. - gabapentin (NEURONTIN) 100 MG capsule; TAKE 1 CAPSULE(100 MG) BY MOUTH THREE TIMES DAILY  Dispense: 90 capsule; Refill: 6  4. Prediabetes - POCT glucose (manual entry) - metFORMIN (GLUCOPHAGE) 500 MG tablet; Take 1 tablet (500 mg total) by mouth daily with breakfast.  Dispense: 90 tablet; Refill: 3  5. Chronic anticoagulation  6. Essential hypertension -  amlodipine-olmesartan (AZOR) 10-20 MG tablet; Take 1 tablet by mouth daily.  Dispense: 90 tablet; Refill: 3 - CBC - Basic metabolic panel  Patient was given the opportunity to ask questions.  Patient verbalized understanding of the plan and was able to repeat key elements of the plan.   Orders Placed This Encounter  Procedures  . CBC  . Basic metabolic panel  . POCT glucose (manual entry)     Requested Prescriptions   Signed Prescriptions Disp Refills  . gabapentin (NEURONTIN) 100 MG capsule 90 capsule 6    Sig: TAKE 1 CAPSULE(100 MG) BY MOUTH THREE TIMES DAILY  . rosuvastatin (CRESTOR) 10 MG tablet 30 tablet 11    Sig: TAKE 1 TABLET(10 MG) BY MOUTH DAILY  . amlodipine-olmesartan (AZOR) 10-20 MG tablet 90 tablet 3    Sig: Take 1 tablet by mouth daily.  . rivaroxaban (XARELTO) 20 MG TABS tablet 90 tablet 3    Sig: Take 1 tablet (20 mg total) by mouth daily with supper.  . metFORMIN (GLUCOPHAGE) 500 MG tablet 90 tablet 3    Sig: Take 1 tablet (500 mg total) by mouth daily with breakfast.    Return in about 3 months (around 10/09/2018).  Jonah Blue, MD, FACP

## 2018-07-10 NOTE — Progress Notes (Signed)
Pt states she has been having pain here and there  Pt states she fell this week and she hurt herself   Pt is needing her gabapentin refilled

## 2018-07-10 NOTE — Patient Instructions (Addendum)
Please consider getting a life alert for yourself.  Let me know if you change your mind about wanting personal care services in your home or home physical therapy.  Fall Prevention in the Home Falls can cause injuries. They can happen to people of all ages. There are many things you can do to make your home safe and to help prevent falls. What can I do on the outside of my home?  Regularly fix the edges of walkways and driveways and fix any cracks.  Remove anything that might make you trip as you walk through a door, such as a raised step or threshold.  Trim any bushes or trees on the path to your home.  Use bright outdoor lighting.  Clear any walking paths of anything that might make someone trip, such as rocks or tools.  Regularly check to see if handrails are loose or broken. Make sure that both sides of any steps have handrails.  Any raised decks and porches should have guardrails on the edges.  Have any leaves, snow, or ice cleared regularly.  Use sand or salt on walking paths during winter.  Clean up any spills in your garage right away. This includes oil or grease spills. What can I do in the bathroom?  Use night lights.  Install grab bars by the toilet and in the tub and shower. Do not use towel bars as grab bars.  Use non-skid mats or decals in the tub or shower.  If you need to sit down in the shower, use a plastic, non-slip stool.  Keep the floor dry. Clean up any water that spills on the floor as soon as it happens.  Remove soap buildup in the tub or shower regularly.  Attach bath mats securely with double-sided non-slip rug tape.  Do not have throw rugs and other things on the floor that can make you trip. What can I do in the bedroom?  Use night lights.  Make sure that you have a light by your bed that is easy to reach.  Do not use any sheets or blankets that are too big for your bed. They should not hang down onto the floor.  Have a firm chair that has  side arms. You can use this for support while you get dressed.  Do not have throw rugs and other things on the floor that can make you trip. What can I do in the kitchen?  Clean up any spills right away.  Avoid walking on wet floors.  Keep items that you use a lot in easy-to-reach places.  If you need to reach something above you, use a strong step stool that has a grab bar.  Keep electrical cords out of the way.  Do not use floor polish or wax that makes floors slippery. If you must use wax, use non-skid floor wax.  Do not have throw rugs and other things on the floor that can make you trip. What can I do with my stairs?  Do not leave any items on the stairs.  Make sure that there are handrails on both sides of the stairs and use them. Fix handrails that are broken or loose. Make sure that handrails are as long as the stairways.  Check any carpeting to make sure that it is firmly attached to the stairs. Fix any carpet that is loose or worn.  Avoid having throw rugs at the top or bottom of the stairs. If you do have throw rugs, attach them  to the floor with carpet tape.  Make sure that you have a light switch at the top of the stairs and the bottom of the stairs. If you do not have them, ask someone to add them for you. What else can I do to help prevent falls?  Wear shoes that: ? Do not have high heels. ? Have rubber bottoms. ? Are comfortable and fit you well. ? Are closed at the toe. Do not wear sandals.  If you use a stepladder: ? Make sure that it is fully opened. Do not climb a closed stepladder. ? Make sure that both sides of the stepladder are locked into place. ? Ask someone to hold it for you, if possible.  Clearly mark and make sure that you can see: ? Any grab bars or handrails. ? First and last steps. ? Where the edge of each step is.  Use tools that help you move around (mobility aids) if they are needed. These  include: ? Canes. ? Walkers. ? Scooters. ? Crutches.  Turn on the lights when you go into a dark area. Replace any light bulbs as soon as they burn out.  Set up your furniture so you have a clear path. Avoid moving your furniture around.  If any of your floors are uneven, fix them.  If there are any pets around you, be aware of where they are.  Review your medicines with your doctor. Some medicines can make you feel dizzy. This can increase your chance of falling. Ask your doctor what other things that you can do to help prevent falls. This information is not intended to replace advice given to you by your health care provider. Make sure you discuss any questions you have with your health care provider. Document Released: 08/10/2009 Document Revised: 03/21/2016 Document Reviewed: 11/18/2014 Elsevier Interactive Patient Education  [employer].

## 2018-07-11 LAB — BASIC METABOLIC PANEL
BUN / CREAT RATIO: 17 (ref 12–28)
BUN: 18 mg/dL (ref 8–27)
CALCIUM: 10 mg/dL (ref 8.7–10.3)
CHLORIDE: 101 mmol/L (ref 96–106)
CO2: 23 mmol/L (ref 20–29)
Creatinine, Ser: 1.08 mg/dL — ABNORMAL HIGH (ref 0.57–1.00)
GFR calc non Af Amer: 49 mL/min/{1.73_m2} — ABNORMAL LOW (ref >59)
GFR, EST AFRICAN AMERICAN: 56 mL/min/{1.73_m2} — AB (ref >59)
GLUCOSE: 96 mg/dL (ref 65–99)
POTASSIUM: 5.2 mmol/L (ref 3.5–5.2)
Sodium: 142 mmol/L (ref 134–144)

## 2018-07-11 LAB — CBC
HEMOGLOBIN: 10.9 g/dL — AB (ref 11.1–15.9)
Hematocrit: 35.3 % (ref 34.0–46.6)
MCH: 27.2 pg (ref 26.6–33.0)
MCHC: 30.9 g/dL — AB (ref 31.5–35.7)
MCV: 88 fL (ref 79–97)
Platelets: 269 10*3/uL (ref 150–450)
RBC: 4.01 x10E6/uL (ref 3.77–5.28)
RDW: 14.6 % (ref 12.3–15.4)
WBC: 6.4 10*3/uL (ref 3.4–10.8)

## 2018-07-11 NOTE — Progress Notes (Signed)
Pt will mild anemia.  Will add iron studies. Declining kidney function.  Will have pt d/c Voltarin gel.  Use Tylenol 500 mg TID PRN.

## 2018-07-11 NOTE — Addendum Note (Signed)
Addended by: Jonah Blue B on: 07/11/2018 10:06 PM   Modules accepted: Orders

## 2018-07-14 ENCOUNTER — Other Ambulatory Visit: Payer: Self-pay | Admitting: Internal Medicine

## 2018-07-14 DIAGNOSIS — M170 Bilateral primary osteoarthritis of knee: Secondary | ICD-10-CM

## 2018-07-14 LAB — SPECIMEN STATUS REPORT

## 2018-07-14 LAB — IRON,TIBC AND FERRITIN PANEL
Ferritin: 656 ng/mL — ABNORMAL HIGH (ref 15–150)
IRON: 48 ug/dL (ref 27–139)
Iron Saturation: 14 % — ABNORMAL LOW (ref 15–55)
Total Iron Binding Capacity: 354 ug/dL (ref 250–450)
UIBC: 306 ug/dL (ref 118–369)

## 2018-07-15 ENCOUNTER — Telehealth: Payer: Self-pay

## 2018-07-15 NOTE — Telephone Encounter (Signed)
Contacted pt to go over lab results pt is aware  Dr. Laural Benes pt only concern is that the gel helps her stand in the morning. Pt states she put the gel on in the morning and it helps with pain. Pt states she doesn't feel comfortable with tylenol because it may not help/work. Pt is wanting to know is there another type of gel she can use?

## 2018-08-10 ENCOUNTER — Other Ambulatory Visit: Payer: Self-pay | Admitting: Internal Medicine

## 2018-08-10 DIAGNOSIS — M170 Bilateral primary osteoarthritis of knee: Secondary | ICD-10-CM

## 2018-09-11 ENCOUNTER — Other Ambulatory Visit: Payer: Self-pay | Admitting: Internal Medicine

## 2018-09-11 DIAGNOSIS — M170 Bilateral primary osteoarthritis of knee: Secondary | ICD-10-CM

## 2018-10-09 ENCOUNTER — Ambulatory Visit: Payer: Medicare Other | Attending: Internal Medicine | Admitting: Internal Medicine

## 2018-10-09 ENCOUNTER — Encounter: Payer: Self-pay | Admitting: Internal Medicine

## 2018-10-09 VITALS — BP 134/60 | HR 67 | Temp 97.5°F | Resp 16 | Wt 193.2 lb

## 2018-10-09 DIAGNOSIS — E785 Hyperlipidemia, unspecified: Secondary | ICD-10-CM | POA: Insufficient documentation

## 2018-10-09 DIAGNOSIS — R7303 Prediabetes: Secondary | ICD-10-CM | POA: Diagnosis not present

## 2018-10-09 DIAGNOSIS — Z79899 Other long term (current) drug therapy: Secondary | ICD-10-CM | POA: Insufficient documentation

## 2018-10-09 DIAGNOSIS — D638 Anemia in other chronic diseases classified elsewhere: Secondary | ICD-10-CM | POA: Insufficient documentation

## 2018-10-09 DIAGNOSIS — E1122 Type 2 diabetes mellitus with diabetic chronic kidney disease: Secondary | ICD-10-CM | POA: Insufficient documentation

## 2018-10-09 DIAGNOSIS — Z7984 Long term (current) use of oral hypoglycemic drugs: Secondary | ICD-10-CM | POA: Insufficient documentation

## 2018-10-09 DIAGNOSIS — I10 Essential (primary) hypertension: Secondary | ICD-10-CM | POA: Diagnosis not present

## 2018-10-09 DIAGNOSIS — N183 Chronic kidney disease, stage 3 unspecified: Secondary | ICD-10-CM

## 2018-10-09 DIAGNOSIS — M170 Bilateral primary osteoarthritis of knee: Secondary | ICD-10-CM

## 2018-10-09 DIAGNOSIS — I129 Hypertensive chronic kidney disease with stage 1 through stage 4 chronic kidney disease, or unspecified chronic kidney disease: Secondary | ICD-10-CM | POA: Insufficient documentation

## 2018-10-09 DIAGNOSIS — Z7901 Long term (current) use of anticoagulants: Secondary | ICD-10-CM | POA: Insufficient documentation

## 2018-10-09 LAB — POCT GLYCOSYLATED HEMOGLOBIN (HGB A1C): HbA1c, POC (prediabetic range): 5.7 % (ref 5.7–6.4)

## 2018-10-09 LAB — GLUCOSE, POCT (MANUAL RESULT ENTRY): POC Glucose: 123 mg/dl — AB (ref 70–99)

## 2018-10-09 MED ORDER — DICLOFENAC SODIUM 1 % TD GEL: 2.0000 g | Freq: Four times a day (QID) | TRANSDERMAL | 5 refills | Status: DC

## 2018-10-09 NOTE — Progress Notes (Signed)
Patient ID: Anna [name], female    DOB: 03/07/1936  MRN: [id number]  CC: Hypertension and Diabetes   Subjective: Anna [name] is a 83 y.o. female who presents for chronic ds management.   Her concerns today include:  82 year old female with history of atrial flutter on Xarelto with history of cardioversion 2015, HTN,trivialAR, mild MR,OA knees, HL, prediabetes.  OA knees:  "My knees are no good."  No further falls since I last saw her.  She does not get out often.  Goes to grocery with her daughter.  She still drives but not often.  Most of the time she allows her daughter to take her places.   Would like to be placed back on Voltaren Gel.  "That's the only thing that helps me get out of bed." Tylenol does not work as well.  We had stopped the Voltaren gel because of declining kidney function on last BMP. -She continues to live alone.  She does mainly microwave dinners but sometimes does a little cooking.  HTN/history of atrial flutter/mild MR: Reports compliance with medications.  When she does cook she tries to limit salt in the foods.  She denies any chest pains or shortness of breath.  No lower extremity swelling.  Denies any palpitations or dizziness.  Denies any bruising or bleeding on the Xarelto.  Prediabetes: Tolerating metformin. Patient Active Problem List   Diagnosis Date Noted  . Mixed incontinence urge and stress 03/09/2018  . Prediabetes 03/09/2018  . OA (osteoarthritis) of knee 03/09/2018  . Sinus bradycardia 12/18/2016  . Estrogen deficiency 07/03/2016  . Mitral regurgitation 03/01/2014  . Atrial flutter (HCC) 03/01/2014  . Permanent atrial fibrillation 12/24/2013  . Essential hypertension, benign 12/24/2013  . Heart murmur 12/24/2013  . Hyperlipemia 12/24/2013     Current Outpatient Medications on File Prior to Visit  Medication Sig Dispense Refill  . amlodipine-olmesartan (AZOR) 10-20 MG tablet Take 1 tablet by mouth daily. 90 tablet 3  . docusate  sodium (COLACE) 100 MG capsule Take 100 mg by mouth daily.     Marland Kitchen gabapentin (NEURONTIN) 100 MG capsule TAKE 1 CAPSULE(100 MG) BY MOUTH THREE TIMES DAILY 90 capsule 6  . metFORMIN (GLUCOPHAGE) 500 MG tablet Take 1 tablet (500 mg total) by mouth daily with breakfast. 90 tablet 3  . Multiple Vitamins-Minerals (CENTRUM SILVER PO) Take 1 tablet by mouth daily.     . Omega-3 Fatty Acids (FISH OIL CONCENTRATE PO) Take 1 capsule by mouth daily.     . rivaroxaban (XARELTO) 20 MG TABS tablet Take 1 tablet (20 mg total) by mouth daily with supper. 90 tablet 3  . rosuvastatin (CRESTOR) 10 MG tablet TAKE 1 TABLET(10 MG) BY MOUTH DAILY 30 tablet 11   No current facility-administered medications on file prior to visit.     No Known Allergies  Social History   Socioeconomic History  . Marital status: Divorced    Spouse name: Not on file  . Number of children: Not on file  . Years of education: Not on file  . Highest education level: Not on file  Occupational History  . Not on file  Social Needs  . Financial resource strain: Not on file  . Food insecurity:    Worry: Not on file    Inability: Not on file  . Transportation needs:    Medical: Not on file    Non-medical: Not on file  Tobacco Use  . Smoking status: Never Smoker  . Smokeless tobacco: Never Used  Substance and Sexual Activity  . Alcohol use: No  . Drug use: No  . Sexual activity: Not on file  Lifestyle  . Physical activity:    Days per week: Not on file    Minutes per session: Not on file  . Stress: Not on file  Relationships  . Social connections:    Talks on phone: Not on file    Gets together: Not on file    Attends religious service: Not on file    Active member of club or organization: Not on file    Attends meetings of clubs or organizations: Not on file    Relationship status: Not on file  . Intimate partner violence:    Fear of current or ex partner: Not on file    Emotionally abused: Not on file    Physically  abused: Not on file    Forced sexual activity: Not on file  Other Topics Concern  . Not on file  Social History Narrative  . Not on file    Family History  Problem Relation Age of Onset  . Alzheimer's disease Mother     Past Surgical History:  Procedure Laterality Date  . CARDIOVERSION N/A 03/24/2014   Procedure: CARDIOVERSION;  Surgeon: Quintella Reichert, MD;  Location: [facility];  Service: Cardiovascular;  Laterality: N/A;    ROS: Review of Systems  Constitutional:       Reportedly sleeping well.  She reports good appetite.  Eyes: Negative for visual disturbance.  Psychiatric/Behavioral: Negative for dysphoric mood and sleep disturbance. The patient is not nervous/anxious.     PHYSICAL EXAM: BP 134/60   Pulse 67   Temp (!) 97.5 F (36.4 C) (Oral)   Resp 16   Wt 193 lb 3.2 oz (87.6 kg)   SpO2 97%   BMI 33.16 kg/m   Wt Readings from Last 3 Encounters:  10/09/18 193 lb 3.2 oz (87.6 kg)  07/10/18 190 lb 6.4 oz (86.4 kg)  03/09/18 195 lb 6.4 oz (88.6 kg)  BP 134/60  Physical Exam  General appearance -elderly Caucasian female in NAD.  Clothing are clean. Mental status -patient is oriented.  She answers questions appropriately. Mouth - mucous membranes moist, pharynx normal without lesions Neck - supple, no significant adenopathy Chest - clear to auscultation, no wheezes, rales or rhonchi, symmetric air entry Heart -regular rate rhythm.  Soft systolic ejection murmur along left lower sternal border. Musculoskeletal -knee joints are enlarged.  No erythema.  Moderate discomfort with passive range of motion.  She ambulates with a 4 pronged cane.  She is unable to get up on the exam table so she is examined in the chair. Extremities -no lower extremity edema.   Results for orders placed or performed in visit on 10/09/18  POCT glucose (manual entry)  Result Value Ref Range   POC Glucose 123 (A) 70 - 99 mg/dl  POCT glycosylated hemoglobin (Hb A1C)  Result Value Ref  Range   Hemoglobin A1C     HbA1c POC (<> result, manual entry)     HbA1c, POC (prediabetic range) 5.7 5.7 - 6.4 %   HbA1c, POC (controlled diabetic range)     Lab Results  Component Value Date   WBC 6.4 07/10/2018   HGB 10.9 (L) 07/10/2018   HCT 35.3 07/10/2018   MCV 88 07/10/2018   PLT 269 07/10/2018   Lab Results  Component Value Date   IRON 48 07/10/2018   TIBC 354 07/10/2018   FERRITIN 656 (H)  07/10/2018     Chemistry      Component Value Date/Time   NA 142 07/10/2018 1549   K 5.2 07/10/2018 1549   CL 101 07/10/2018 1549   CO2 23 07/10/2018 1549   BUN 18 07/10/2018 1549   CREATININE 1.08 (H) 07/10/2018 1549   CREATININE 1.04 (H) 06/19/2016 1503      Component Value Date/Time   CALCIUM 10.0 07/10/2018 1549   ALKPHOS 75 09/16/2017 1613   AST 24 09/16/2017 1613   ALT 9 09/16/2017 1613   BILITOT 0.4 09/16/2017 1613      ASSESSMENT AND PLAN:  1. Primary osteoarthritis of both knees Since the Voltaren gel really helps with pain control and function in her home, we will put her back on it.  Advised that she can still continue the Tylenol as needed. - diclofenac sodium (VOLTAREN) 1 % GEL; Apply 2 g topically 4 (four) times daily.  Dispense: 400 g; Refill: 5  2. Essential hypertension Close to goal.  Continue Azor - Comprehensive metabolic panel  3. CKD (chronic kidney disease) stage 3, GFR 30-59 ml/min (HCC) We will plan to recheck kidney function today.  Advised to avoid any over-the-counter pain medications except Tylenol. - Comprehensive metabolic panel  4. Prediabetes Continue metformin - POCT glucose (manual entry) - POCT glycosylated hemoglobin (Hb A1C)  5. Anemia, chronic disease Continue to monitor    Patient was given the opportunity to ask questions.  Patient verbalized understanding of the plan and was able to repeat key elements of the plan.   Orders Placed This Encounter  Procedures  . Comprehensive metabolic panel  . POCT glucose (manual  entry)  . POCT glycosylated hemoglobin (Hb A1C)     Requested Prescriptions   Signed Prescriptions Disp Refills  . diclofenac sodium (VOLTAREN) 1 % GEL 400 g 5    Sig: Apply 2 g topically 4 (four) times daily.    Return in about 4 months (around 02/08/2019).  Jonah Blue, MD, FACP

## 2018-10-09 NOTE — Patient Instructions (Signed)
I have sent a refill on Voltaren gel to your pharmacy. You can continue taking the Tylenol as needed.

## 2018-10-10 LAB — COMPREHENSIVE METABOLIC PANEL
ALT: 21 IU/L (ref 0–32)
AST: 31 IU/L (ref 0–40)
Albumin/Globulin Ratio: 1.6 (ref 1.2–2.2)
Albumin: 4.4 g/dL (ref 3.5–4.7)
Alkaline Phosphatase: 93 IU/L (ref 39–117)
BILIRUBIN TOTAL: 0.4 mg/dL (ref 0.0–1.2)
BUN/Creatinine Ratio: 16 (ref 12–28)
BUN: 15 mg/dL (ref 8–27)
CO2: 22 mmol/L (ref 20–29)
Calcium: 9.8 mg/dL (ref 8.7–10.3)
Chloride: 99 mmol/L (ref 96–106)
Creatinine, Ser: 0.92 mg/dL (ref 0.57–1.00)
GFR calc non Af Amer: 59 mL/min/{1.73_m2} — ABNORMAL LOW (ref >59)
GFR, EST AFRICAN AMERICAN: 68 mL/min/{1.73_m2} (ref >59)
GLUCOSE: 104 mg/dL — AB (ref 65–99)
Globulin, Total: 2.7 g/dL (ref 1.5–4.5)
Potassium: 4.4 mmol/L (ref 3.5–5.2)
Sodium: 138 mmol/L (ref 134–144)
Total Protein: 7.1 g/dL (ref 6.0–8.5)

## 2018-10-13 ENCOUNTER — Telehealth: Payer: Self-pay

## 2018-10-13 NOTE — Telephone Encounter (Signed)
Contacted pt to go over lab results pt is aware and doesn't have any questions or concerns 

## 2019-01-05 ENCOUNTER — Other Ambulatory Visit: Payer: Self-pay | Admitting: Internal Medicine

## 2019-01-05 DIAGNOSIS — M170 Bilateral primary osteoarthritis of knee: Secondary | ICD-10-CM

## 2019-02-08 ENCOUNTER — Other Ambulatory Visit: Payer: Self-pay

## 2019-02-08 ENCOUNTER — Ambulatory Visit: Payer: Medicare Other | Attending: Internal Medicine | Admitting: Internal Medicine

## 2019-02-08 DIAGNOSIS — I4892 Unspecified atrial flutter: Secondary | ICD-10-CM | POA: Diagnosis not present

## 2019-02-08 DIAGNOSIS — Z7901 Long term (current) use of anticoagulants: Secondary | ICD-10-CM

## 2019-02-08 DIAGNOSIS — M170 Bilateral primary osteoarthritis of knee: Secondary | ICD-10-CM | POA: Diagnosis not present

## 2019-02-08 DIAGNOSIS — I10 Essential (primary) hypertension: Secondary | ICD-10-CM | POA: Diagnosis not present

## 2019-02-08 NOTE — Progress Notes (Signed)
Virtual Visit via Telephone Note  I connected with Anna [name] on 02/08/19 at 1:36 by telephone from my office and verified that I am speaking with the correct person using two identifiers.  Pt is at home.    I discussed the limitations, risks, security and privacy concerns of performing an evaluation and management service by telephone and the availability of in person appointments. I also discussed with the patient that there may be a patient responsible charge related to this service. The patient expressed understanding and agreed to proceed.   History of Present Illness: 83 year old female with history of atrial flutter on Xarelto with history of cardioversion 2015, HTN,trivialAR, mild MR,OA knees, HL, prediabetes.   OA Knees:  "I can walk good around the house." Using the Voltaren gel 2-3 x a day.  This keeps her pain controlled at a level that she is able to function around her house No falls  HTN:  Checks BP occasionally.  She does not recall readings -Limits salt in foods No CP/SOB/LE edema/HA/dizziness  A.flutter:  No bruising or bleeding, palpitations.  No blood in stools, no hematuria  Reports good sleep, appetite.  Moving bowels Okay.  Daughter will be taking her to the grocery store later this week to do her grocery shopping.  She plans to wear a mask and gloves  Observations/Objective: No direct observation done as this was a telephone visit  Assessment and Plan: 1. Primary osteoarthritis of both knees Continue Voltaren gel.  Patient seems to be functional at home.  2. Essential hypertension Advised patient to check the blood pressure at least twice a week and write down the numbers for me so that I can review them on her next visit.  Continue low-salt diet  3. Chronic anticoagulation 4. Atrial flutter, unspecified type (HCC) Denies any bruising or bleeding on Xarelto.  Will be due for CBC and Marden Noble on follow-up visit   Follow Up Instructions: F/u in 2 mths    I discussed the assessment and treatment plan with the patient. The patient was provided an opportunity to ask questions and all were answered. The patient agreed with the plan and demonstrated an understanding of the instructions.   The patient was advised to call back or seek an in-person evaluation if the symptoms worsen or if the condition fails to improve as anticipated.  I provided 10 minutes of non-face-to-face time during this encounter.   Jonah Blue, MD

## 2019-02-16 ENCOUNTER — Telehealth: Payer: Self-pay

## 2019-02-16 NOTE — Telephone Encounter (Signed)
Left message for pt to call back about appt options on Thursday.

## 2019-02-17 ENCOUNTER — Telehealth: Payer: Self-pay

## 2019-02-17 NOTE — Progress Notes (Signed)
Virtual Visit via Video Note   This visit type was conducted due to national recommendations for restrictions regarding the COVID-19 Pandemic (e.g. social distancing) in an effort to limit this patient's exposure and mitigate transmission in our community.  Due to her co-morbid illnesses, this patient is at least at moderate risk for complications without adequate follow up.  This format is felt to be most appropriate for this patient at this time.  All issues noted in this document were discussed and addressed.  A limited physical exam was performed with this format.  Please refer to the patient's chart for her consent to telehealth for [facility]  Evaluation Performed:  Follow-up visit  This visit type was conducted due to national recommendations for restrictions regarding the COVID-19 Pandemic (e.g. social distancing).  This format is felt to be most appropriate for this patient at this time.  All issues noted in this document were discussed and addressed.  No physical exam was performed (except for noted visual exam findings with Video Visits).  Please refer to the patient's chart (MyChart message for video visits and phone note for telephone visits) for the patient's consent to telehealth for [facility].  Date:  02/18/2019   ID:  Anna [name], DOB 21-Dec-1935, MRN [id number]  Patient Location:  Home  Provider location:   [place]  PCP:  Marcine Matar, MD  Cardiologist:  Loanne Drilling, MD Electrophysiologist:  None   Chief Complaint:  Atrial fibrillation and HTN  History of Present Illness:    Anna [name] is a 83 y.o. female who presents via audio/video conferencing for a telehealth visit today.    Anna [name] is a 83 y.o. female with a hx of HTN,permanentatrial fibrillation on Xarelto due to her CHADS2VASC score of 3 and mild MR by echo.  She is here today for followup and is doing well.  She denies any chest pain or pressure, SOB, DOE, PND, orthopnea,   dizziness, palpitations or syncope. She has occasional LE edema if she stands on her legs too long.  She is compliant with her meds and is tolerating meds with no SE.    The patient does not have symptoms concerning for COVID-19 infection (fever, chills, cough, or new shortness of breath).    Prior CV studies:   The following studies were reviewed today:  2D echo  Past Medical History:  Diagnosis Date  . Hyperlipidemia   . Hypertension   . Osteoarthritis   . Overweight(278.02)   . Permanent atrial fibrillation    Past Surgical History:  Procedure Laterality Date  . CARDIOVERSION N/A 03/24/2014   Procedure: CARDIOVERSION;  Surgeon: Quintella Reichert, MD;  Location: [facility];  Service: Cardiovascular;  Laterality: N/A;     Current Meds  Medication Sig  . amlodipine-olmesartan (AZOR) 10-20 MG tablet Take 1 tablet by mouth daily.  . diclofenac sodium (VOLTAREN) 1 % GEL Apply 2 g topically 4 (four) times daily.  Marland Kitchen docusate sodium (COLACE) 100 MG capsule Take 100 mg by mouth daily.   Marland Kitchen gabapentin (NEURONTIN) 100 MG capsule TAKE 1 CAPSULE BY MOUTH THREE TIMES A DAY  . metFORMIN (GLUCOPHAGE) 500 MG tablet Take 1 tablet (500 mg total) by mouth daily with breakfast.  . Multiple Vitamins-Minerals (CENTRUM SILVER PO) Take 1 tablet by mouth daily.   . Omega-3 Fatty Acids (FISH OIL CONCENTRATE PO) Take 1 capsule by mouth daily.   . rivaroxaban (XARELTO) 20 MG TABS tablet Take 1 tablet (20 mg total)  by mouth daily with supper.  . rosuvastatin (CRESTOR) 10 MG tablet TAKE 1 TABLET(10 MG) BY MOUTH DAILY     Allergies:   Patient has no known allergies.   Social History   Tobacco Use  . Smoking status: Never Smoker  . Smokeless tobacco: Never Used  Substance Use Topics  . Alcohol use: No  . Drug use: No     Family Hx: The patient's family history includes Alzheimer's disease in her mother.  ROS:   Please see the history of present illness.     All other systems reviewed and are  negative.   Labs/Other Tests and Data Reviewed:    Recent Labs: 07/10/2018: Hemoglobin 10.9; Platelets 269 10/09/2018: ALT 21; BUN 15; Creatinine, Ser 0.92; Potassium 4.4; Sodium 138   Recent Lipid Panel Lab Results  Component Value Date/Time   CHOL 98 (L) 06/29/2015 09:45 AM   TRIG 72 06/29/2015 09:45 AM   HDL 51 06/29/2015 09:45 AM   CHOLHDL 1.9 06/29/2015 09:45 AM   LDLCALC 33 06/29/2015 09:45 AM    Wt Readings from Last 3 Encounters:  02/18/19 190 lb (86.2 kg)  10/09/18 193 lb 3.2 oz (87.6 kg)  07/10/18 190 lb 6.4 oz (86.4 kg)     Objective:    Vital Signs:  BP (!) 154/60   Pulse (!) 55   Ht 5\' 4"  (1.626 m)   Wt 190 lb (86.2 kg)   BMI 32.61 kg/m     ASSESSMENT & PLAN:    1.  Permanent atrial fibrillation - she has not had any palpitations and feels her heart rate is well controlled.  She will continue on Xarelto 20mg  daily.  Her creatinine clearance in 09/2018 was 66.34ml/min. Her Hbg was stable at 10.9 last fall.  She has not had any bleeding problems.  I will repeat a BMET and CBC in July due to the current COVID 19 crisis.    2.  Hypertension - She checks her BP at home and her BP is controlled even though it is elevated today.  Yesterday it was 144/63mmHg.  She will continue on amlodipine-olmesartan 10-20mg  daily.  Her creatinine was stable at 0.92 09/2018.    3.  Mitral regurgitation - 2D echo 01/2018 showed mild MR.  4.  Asymptomatic bradycardia - she is not on any rate slowing meds.   4. COVID-19 Education:The signs and symptoms of COVID-19 were discussed with the patient and how to seek care for testing (follow up with PCP or arrange E-visit).  The importance of social distancing was discussed today.  Patient Risk:   After full review of this patient's clinical status, I feel that they are at least moderate risk at this time.  Time:   Today, I have spent 15 minutes directly with the patient on video discussing medical problems including atrial  fibrillation, HTN and MR.  We also reviewed the symptoms of COVID 19 and the ways to protect against contracting the virus with telehealth technology.  I spent an additional 5 minutes reviewing patient's chart including 2D echo 09/2018 and labs.  Medication Adjustments/Labs and Tests Ordered: Current medicines are reviewed at length with the patient today.  Concerns regarding medicines are outlined above.  Tests Ordered: No orders of the defined types were placed in this encounter.  Medication Changes: No orders of the defined types were placed in this encounter.   Disposition:  Follow up in 6 month(s)  Signed, Armanda Magic, MD  02/18/2019 3:08 PM    [facility]

## 2019-02-17 NOTE — Telephone Encounter (Signed)
Pt has given consent for a phone visit with Dr. Mayford Knife. Pt will have her BP ready for her appt time.   YOUR CARDIOLOGY TEAM HAS ARRANGED FOR AN E-VISIT FOR YOUR APPOINTMENT - PLEASE REVIEW IMPORTANT INFORMATION BELOW SEVERAL DAYS PRIOR TO YOUR APPOINTMENT  Due to the recent COVID-19 pandemic, we are transitioning in-person office visits to tele-medicine visits in an effort to decrease unnecessary exposure to our patients and staff. Medicare and most insurances are covering these visits without a copay needed. You will need a working email and a smartphone or computer with a camera and microphone. For patients that do not have these items, we can still complete the visit using a telephone but do prefer video when possible. If possible, we also ask that you have a blood pressure cuff and scale at home to measure your blood pressure, heart rate and weight prior to your scheduled appointment. Patients with clinical needs that need an in-person evaluation and testing will still be able to come to the office if absolutely necessary. If you have any questions, feel free to call our office.     DOWNLOADING THE SOFTWARE  Download the [employer] app to enable video and telephone visits with your [facility] Provider.   Instructions for downloading [employer] WebEx: - Go to https://www.webex.com/downloads.html and follow the instructions, or download the app on your smartphone [facility] [employer] Meetings). - If you have technical difficulties with downloading WebEx, please call WebEx at [phone]. - Once the app is downloaded (can be done on either mobile or desktop computer), go to Settings in the upper left hand corner.  Be sure that camera and audio are enabled.  - You will receive an email message with a link to the meeting with a time to join for your tele-health visit.  - Please download the app and have settings configured prior to the appointment time.      2-3 DAYS BEFORE YOUR APPOINTMENT  One of  our staff will call you to confirm that you have been able to set up your WebEx account. We will remind you check your blood pressure, heart rate and weight prior to your scheduled appointment. If you have an Apple Watch or Kardia, please upload any pertinent ECG strips the day before or morning of your appointment to MyChart. Our staff will also make sure you have reviewed the consent and agree to move forward with your scheduled tele-health visit.    THE DAY OF YOUR APPOINTMENT  Approximately 15-20 minutes prior to your scheduled appointment, you will receive an e-mail directly from one of our staff member's [email or web address] e-mail accounts inviting you to join a WebEx meeting.  Please do not reply to that email - simply join the [employer].  Upon joining, a member of the office staff will speak with you initially through the WebEx platform to confirm medications, vital signs for the day and any symptoms you may be experiencing.  Please have this information available prior to the time of visit start.      CONSENT FOR TELE-HEALTH VISIT - PLEASE RVIEW  I hereby voluntarily request, consent and authorize [facility] and its employed or contracted physicians, physician assistants, nurse practitioners or other licensed health care professionals (the Practitioner), to provide me with telemedicine health care services (the "Services") as deemed necessary by the treating Practitioner. I acknowledge and consent to receive the Services by the Practitioner via telemedicine. I understand that the telemedicine visit will involve communicating with the Practitioner through live  audiovisual [place] manager and the disclosure of certain medical information by electronic transmission. I acknowledge that I have been given the opportunity to request an in-person assessment or other available alternative prior to the telemedicine visit and am voluntarily participating in the telemedicine visit.  I  understand that I have the right to withhold or withdraw my consent to the use of telemedicine in the course of my care at any time, without affecting my right to future care or treatment, and that the Practitioner or I may terminate the telemedicine visit at any time. I understand that I have the right to inspect all information obtained and/or recorded in the course of the telemedicine visit and may receive copies of available information for a reasonable fee.  I understand that some of the potential risks of receiving the Services via telemedicine include:  Marland Kitchen Delay or interruption in medical evaluation due to technological equipment failure or disruption; . Information transmitted may not be sufficient (e.g. poor resolution of images) to allow for appropriate medical decision making by the Practitioner; and/or  . In rare instances, security protocols could fail, causing a breach of personal health information.  Furthermore, I acknowledge that it is my responsibility to provide information about my medical history, conditions and care that is complete and accurate to the best of my ability. I acknowledge that Practitioner's advice, recommendations, and/or decision may be based on factors not within their control, such as incomplete or inaccurate data provided by me or distortions of diagnostic images or specimens that may result from electronic transmissions. I understand that the practice of medicine is not an exact science and that Practitioner makes no warranties or guarantees regarding treatment outcomes. I acknowledge that I will receive a copy of this consent concurrently upon execution via email to the email address I last provided but may also request a printed copy by calling the office of [facility].    I understand that my insurance will be billed for this visit.   I have read or had this consent read to me. . I understand the contents of this consent, which adequately explains the  benefits and risks of the Services being provided via telemedicine.  . I have been provided ample opportunity to ask questions regarding this consent and the Services and have had my questions answered to my satisfaction. . I give my informed consent for the services to be provided through the use of telemedicine in my medical care  By participating in this telemedicine visit I agree to the above.

## 2019-02-18 ENCOUNTER — Other Ambulatory Visit: Payer: Self-pay

## 2019-02-18 ENCOUNTER — Telehealth (INDEPENDENT_AMBULATORY_CARE_PROVIDER_SITE_OTHER): Payer: Medicare Other | Admitting: Cardiology

## 2019-02-18 ENCOUNTER — Encounter: Payer: Self-pay | Admitting: Cardiology

## 2019-02-18 VITALS — BP 154/60 | HR 55 | Ht 64.0 in | Wt 190.0 lb

## 2019-02-18 DIAGNOSIS — I340 Nonrheumatic mitral (valve) insufficiency: Secondary | ICD-10-CM | POA: Diagnosis not present

## 2019-02-18 DIAGNOSIS — I4821 Permanent atrial fibrillation: Secondary | ICD-10-CM

## 2019-02-18 DIAGNOSIS — I10 Essential (primary) hypertension: Secondary | ICD-10-CM | POA: Diagnosis not present

## 2019-02-18 DIAGNOSIS — R001 Bradycardia, unspecified: Secondary | ICD-10-CM | POA: Diagnosis not present

## 2019-02-18 DIAGNOSIS — Z7189 Other specified counseling: Secondary | ICD-10-CM

## 2019-02-18 NOTE — Patient Instructions (Signed)
Medication Instructions:  Your physician recommends that you continue on your current medications as directed. Please refer to the Current Medication list given to you today.  If you need a refill on your cardiac medications before your next appointment, please call your pharmacy.   Lab work: BMET and CBC on 05/10/19  If you have labs (blood work) drawn today and your tests are completely normal, you will receive your results only by: Marland Kitchen MyChart Message (if you have MyChart) OR . A paper copy in the mail If you have any lab test that is abnormal or we need to change your treatment, we will call you to review the results.  Testing/Procedures: None  Follow-Up: At [facility], you and your health needs are our priority.  As part of our continuing mission to provide you with exceptional heart care, we have created designated Provider Care Teams.  These Care Teams include your primary Cardiologist (physician) and Advanced Practice Providers (APPs -  Physician Assistants and Nurse Practitioners) who all work together to provide you with the care you need, when you need it. You will need a follow up appointment in 6 months.  Please call our office 2 months in advance to schedule this appointment.  You may see Dr. Mayford Knife or one of the following Advanced Practice Providers on your designated Care Team:   [place], PA-C Ronie Spies, PA-C . Jacolyn Reedy, PA-C

## 2019-05-10 ENCOUNTER — Other Ambulatory Visit: Payer: Medicare Other | Admitting: *Deleted

## 2019-05-10 ENCOUNTER — Other Ambulatory Visit: Payer: Self-pay

## 2019-05-10 DIAGNOSIS — I4821 Permanent atrial fibrillation: Secondary | ICD-10-CM | POA: Diagnosis not present

## 2019-05-10 LAB — CBC
Hematocrit: 33.6 % — ABNORMAL LOW (ref 34.0–46.6)
Hemoglobin: 10.4 g/dL — ABNORMAL LOW (ref 11.1–15.9)
MCH: 26.1 pg — ABNORMAL LOW (ref 26.6–33.0)
MCHC: 31 g/dL — ABNORMAL LOW (ref 31.5–35.7)
MCV: 84 fL (ref 79–97)
Platelets: 269 10*3/uL (ref 150–450)
RBC: 3.98 x10E6/uL (ref 3.77–5.28)
RDW: 14.2 % (ref 11.7–15.4)
WBC: 6.2 10*3/uL (ref 3.4–10.8)

## 2019-05-10 LAB — BASIC METABOLIC PANEL
BUN/Creatinine Ratio: 18 (ref 12–28)
BUN: 20 mg/dL (ref 8–27)
CO2: 25 mmol/L (ref 20–29)
Calcium: 9.8 mg/dL (ref 8.7–10.3)
Chloride: 100 mmol/L (ref 96–106)
Creatinine, Ser: 1.1 mg/dL — ABNORMAL HIGH (ref 0.57–1.00)
GFR calc Af Amer: 54 mL/min/{1.73_m2} — ABNORMAL LOW (ref >59)
GFR calc non Af Amer: 47 mL/min/{1.73_m2} — ABNORMAL LOW (ref >59)
Glucose: 91 mg/dL (ref 65–99)
Potassium: 5.1 mmol/L (ref 3.5–5.2)
Sodium: 137 mmol/L (ref 134–144)

## 2019-05-12 ENCOUNTER — Telehealth: Payer: Self-pay | Admitting: Internal Medicine

## 2019-05-12 DIAGNOSIS — N183 Chronic kidney disease, stage 3 unspecified: Secondary | ICD-10-CM

## 2019-05-12 NOTE — Telephone Encounter (Signed)
PC placed to pt today.  I informed her that I received lab results from the cardiologist.  BMP indicates that eGFR has declined to 47 from 59 in 09/2018.  I recommend referral to nephrology for further eval.  Pt is agreeable to this.

## 2019-06-04 ENCOUNTER — Other Ambulatory Visit: Payer: Self-pay | Admitting: Internal Medicine

## 2019-07-03 ENCOUNTER — Other Ambulatory Visit: Payer: Self-pay | Admitting: Internal Medicine

## 2019-07-03 DIAGNOSIS — M170 Bilateral primary osteoarthritis of knee: Secondary | ICD-10-CM

## 2019-07-09 ENCOUNTER — Other Ambulatory Visit: Payer: Self-pay | Admitting: Internal Medicine

## 2019-07-09 DIAGNOSIS — M170 Bilateral primary osteoarthritis of knee: Secondary | ICD-10-CM

## 2019-07-24 ENCOUNTER — Other Ambulatory Visit: Payer: Self-pay | Admitting: Internal Medicine

## 2019-07-24 DIAGNOSIS — I4892 Unspecified atrial flutter: Secondary | ICD-10-CM

## 2019-07-24 DIAGNOSIS — I10 Essential (primary) hypertension: Secondary | ICD-10-CM

## 2019-07-24 DIAGNOSIS — R7303 Prediabetes: Secondary | ICD-10-CM

## 2019-08-13 ENCOUNTER — Encounter: Payer: Self-pay | Admitting: Internal Medicine

## 2019-08-13 ENCOUNTER — Ambulatory Visit: Payer: Medicare Other | Attending: Internal Medicine | Admitting: Internal Medicine

## 2019-08-13 ENCOUNTER — Other Ambulatory Visit: Payer: Self-pay

## 2019-08-13 VITALS — BP 146/72 | HR 54 | Temp 98.0°F | Resp 16 | Wt 194.6 lb

## 2019-08-13 DIAGNOSIS — R7303 Prediabetes: Secondary | ICD-10-CM | POA: Diagnosis not present

## 2019-08-13 DIAGNOSIS — N183 Chronic kidney disease, stage 3 unspecified: Secondary | ICD-10-CM

## 2019-08-13 DIAGNOSIS — M170 Bilateral primary osteoarthritis of knee: Secondary | ICD-10-CM | POA: Diagnosis not present

## 2019-08-13 DIAGNOSIS — I10 Essential (primary) hypertension: Secondary | ICD-10-CM

## 2019-08-13 LAB — GLUCOSE, POCT (MANUAL RESULT ENTRY): POC Glucose: 90 mg/dl (ref 70–99)

## 2019-08-13 MED ORDER — DICLOFENAC SODIUM 1 % TD GEL: 2.0000 g | Freq: Four times a day (QID) | TRANSDERMAL | 3 refills | Status: DC

## 2019-08-13 NOTE — Progress Notes (Signed)
Patient ID: Anna [name], female    DOB: Jul 30, 1936  MRN: [id number]  CC: Medication Refill   Subjective: Anna [name] is a 83 y.o. female who presents for chronic ds management Her concerns today include:  83 year old female with history of atrial flutter on Xarelto with history of cardioversion 2015, HTN,trivialAR, mild MR,OA knees, HL, prediabetes.  CKD:   Patient had BMP in July that was done by her cardiologist.  This revealed her GFR at decline to 77.  Previously it was 63 in December of last year.  I referred her to [facility].  So far she has not received a call as yet for an appointment.  According to the referral note they said that they will contact the patient to give an appointment.  HTN:  Compliant  With meds and took already for today. Check BP once a wk She does not recall the numbners.  Forgot to bring her written log +SOB on exertion.  No LE edema.  No palpitations or chest pain.  No PND/orthopnea  Hx of atrial flutter:  No bruising or bleeding on Xarelto  OA knees:  Uses Voltaren Gel 3 imes a day.  Gets 4 tubes a mth.  She finds the gel most helpful for her knee pain.  Needs RF.  Denies any falls.  She ambulates using a 4 pronged cane.  She lives alone.  Prediabetes: On metformin.  Does okay with eating habits.  Patient Active Problem List   Diagnosis Date Noted  . Mixed incontinence urge and stress 03/09/2018  . Prediabetes 03/09/2018  . OA (osteoarthritis) of knee 03/09/2018  . Sinus bradycardia 12/18/2016  . Estrogen deficiency 07/03/2016  . Mitral regurgitation 03/01/2014  . Atrial flutter (HCC) 03/01/2014  . Permanent atrial fibrillation (HCC) 12/24/2013  . Essential hypertension, benign 12/24/2013  . Heart murmur 12/24/2013  . Hyperlipemia 12/24/2013     Current Outpatient Medications on File Prior to Visit  Medication Sig Dispense Refill  . amlodipine-olmesartan (AZOR) 10-20 MG tablet Take 1 tablet by mouth daily. Must have  office visit for refills 30 tablet 0  . docusate sodium (COLACE) 100 MG capsule Take 100 mg by mouth daily.     Marland Kitchen gabapentin (NEURONTIN) 100 MG capsule TAKE 1 CAPSULE BY MOUTH THREE TIMES A DAY 270 capsule 3  . metFORMIN (GLUCOPHAGE) 500 MG tablet Take 1 tablet (500 mg total) by mouth daily with breakfast. Must have office visit for refills 30 tablet 0  . Multiple Vitamins-Minerals (CENTRUM SILVER PO) Take 1 tablet by mouth daily.     . Omega-3 Fatty Acids (FISH OIL CONCENTRATE PO) Take 1 capsule by mouth daily.     . rivaroxaban (XARELTO) 20 MG TABS tablet Take 1 tablet (20 mg total) by mouth daily with supper. Must have office visit for refills 30 tablet 0  . rosuvastatin (CRESTOR) 10 MG tablet TAKE 1 TABLET BY MOUTH EVERY DAY 90 tablet 0   No current facility-administered medications on file prior to visit.     No Known Allergies  Social History   Socioeconomic History  . Marital status: Divorced    Spouse name: Not on file  . Number of children: Not on file  . Years of education: Not on file  . Highest education level: Not on file  Occupational History  . Not on file  Social Needs  . Financial resource strain: Not on file  . Food insecurity    Worry: Not on file    Inability:  Not on file  . Transportation needs    Medical: Not on file    Non-medical: Not on file  Tobacco Use  . Smoking status: Never Smoker  . Smokeless tobacco: Never Used  Substance and Sexual Activity  . Alcohol use: No  . Drug use: No  . Sexual activity: Not on file  Lifestyle  . Physical activity    Days per week: Not on file    Minutes per session: Not on file  . Stress: Not on file  Relationships  . Social Herbalist on phone: Not on file    Gets together: Not on file    Attends religious service: Not on file    Active member of club or organization: Not on file    Attends meetings of clubs or organizations: Not on file    Relationship status: Not on file  . Intimate partner  violence    Fear of current or ex partner: Not on file    Emotionally abused: Not on file    Physically abused: Not on file    Forced sexual activity: Not on file  Other Topics Concern  . Not on file  Social History Narrative  . Not on file    Family History  Problem Relation Age of Onset  . Alzheimer's disease Mother     Past Surgical History:  Procedure Laterality Date  . CARDIOVERSION N/A 03/24/2014   Procedure: CARDIOVERSION;  Surgeon: Sueanne Margarita, MD;  Location: [facility];  Service: Cardiovascular;  Laterality: N/A;    ROS: Review of Systems Negative except as stated above  PHYSICAL EXAM: BP (!) 146/72   Pulse (!) 54   Temp 98 F (36.7 C) (Oral)   Resp 16   Wt 194 lb 9.6 oz (88.3 kg)   SpO2 97%   BMI 33.40 kg/m   Physical Exam BP 150/70 General appearance - alert, well appearing, elderly female and in no distress Mental status - normal mood, behavior, speech, dress, motor activity, and thought processes Mouth - mucous membranes moist, pharynx normal without lesions Neck - supple, no significant adenopathy Chest - clear to auscultation, no wheezes, rales or rhonchi, symmetric air entry Heart -heart rate is bradycardic and irregular Musculoskeletal -knees: Large body habitus.  No point tenderness.  Slow passive movement of both knees due to pain.   Extremities -no lower extremity edema.  She has varicose veins on the legs.  She is wearing compression socks.   CMP Latest Ref Rng & Units 05/10/2019 10/09/2018 07/10/2018  Glucose 65 - 99 mg/dL 91 104(H) 96  BUN 8 - 27 mg/dL 20 15 18   Creatinine 0.57 - 1.00 mg/dL 1.10(H) 0.92 1.08(H)  Sodium 134 - 144 mmol/L 137 138 142  Potassium 3.5 - 5.2 mmol/L 5.1 4.4 5.2  Chloride 96 - 106 mmol/L 100 99 101  CO2 20 - 29 mmol/L 25 22 23   Calcium 8.7 - 10.3 mg/dL 9.8 9.8 10.0  Total Protein 6.0 - 8.5 g/dL - 7.1 -  Total Bilirubin 0.0 - 1.2 mg/dL - 0.4 -  Alkaline Phos 39 - 117 IU/L - 93 -  AST 0 - 40 IU/L - 31 -  ALT  0 - 32 IU/L - 21 -   Lipid Panel     Component Value Date/Time   CHOL 98 (L) 06/29/2015 0945   TRIG 72 06/29/2015 0945   HDL 51 06/29/2015 0945   CHOLHDL 1.9 06/29/2015 0945   VLDL 14 06/29/2015 0945  LDLCALC 33 06/29/2015 0945    CBC    Component Value Date/Time   WBC 6.2 05/10/2019 1000   WBC 6.3 06/19/2016 1503   RBC 3.98 05/10/2019 1000   RBC 4.21 06/19/2016 1503   HGB 10.4 (L) 05/10/2019 1000   HCT 33.6 (L) 05/10/2019 1000   PLT 269 05/10/2019 1000   MCV 84 05/10/2019 1000   MCH 26.1 (L) 05/10/2019 1000   MCH 28.0 06/19/2016 1503   MCHC 31.0 (L) 05/10/2019 1000   MCHC 32.3 06/19/2016 1503   RDW 14.2 05/10/2019 1000   LYMPHSABS 1.9 12/18/2016 1452   MONOABS 378 06/19/2016 1503   EOSABS 0.2 12/18/2016 1452   BASOSABS 0.1 12/18/2016 1452    ASSESSMENT AND PLAN: 1. Osteoarthritis of both knees, unspecified osteoarthritis type I have refilled the Voltaren gel.  She will continue to use.  Oral NSAIDs not an option due to her age and kidney function Since she lives alone, I recommend that she consider getting med alert so that she can call for help in the event of any medical emergencies.  She will discuss with her daughter.  2. Essential hypertension Blood pressure is acceptable given her age.  3. Stage 3 chronic kidney disease, unspecified whether stage 3a or 3b CKD We will recheck kidney function today.  If any worsening, I will have her stop metformin.  Message sent to our referral coordinator inquiring about an appointment with the kidney specialist - Comprehensive metabolic panel  4. Prediabetes See plan mentioned in #3 above.  Encourage her to continue healthy eating habits - POCT glucose (manual entry) - Hemoglobin A1c   Patient was given the opportunity to ask questions.  Patient verbalized understanding of the plan and was able to repeat key elements of the plan.   Orders Placed This Encounter  Procedures  . Hemoglobin A1c  . Comprehensive metabolic  panel  . POCT glucose (manual entry)     Requested Prescriptions   Signed Prescriptions Disp Refills  . diclofenac sodium (VOLTAREN) 1 % GEL 400 g 3    Sig: Apply 2 g topically 4 (four) times daily.    Return in about 4 months (around 12/14/2019).  Jonah Blue, MD, FACP

## 2019-08-14 LAB — COMPREHENSIVE METABOLIC PANEL
ALT: 9 IU/L (ref 0–32)
AST: 20 IU/L (ref 0–40)
Albumin/Globulin Ratio: 1.7 (ref 1.2–2.2)
Albumin: 4.6 g/dL (ref 3.6–4.6)
Alkaline Phosphatase: 86 IU/L (ref 39–117)
BUN/Creatinine Ratio: 14 (ref 12–28)
BUN: 15 mg/dL (ref 8–27)
Bilirubin Total: 0.4 mg/dL (ref 0.0–1.2)
CO2: 23 mmol/L (ref 20–29)
Calcium: 9.9 mg/dL (ref 8.7–10.3)
Chloride: 101 mmol/L (ref 96–106)
Creatinine, Ser: 1.07 mg/dL — ABNORMAL HIGH (ref 0.57–1.00)
GFR calc Af Amer: 56 mL/min/{1.73_m2} — ABNORMAL LOW (ref >59)
GFR calc non Af Amer: 49 mL/min/{1.73_m2} — ABNORMAL LOW (ref >59)
Globulin, Total: 2.7 g/dL (ref 1.5–4.5)
Glucose: 93 mg/dL (ref 65–99)
Potassium: 4.5 mmol/L (ref 3.5–5.2)
Sodium: 138 mmol/L (ref 134–144)
Total Protein: 7.3 g/dL (ref 6.0–8.5)

## 2019-08-14 LAB — HEMOGLOBIN A1C
Est. average glucose Bld gHb Est-mCnc: 131 mg/dL
Hgb A1c MFr Bld: 6.2 % — ABNORMAL HIGH (ref 4.8–5.6)

## 2019-08-17 ENCOUNTER — Telehealth: Payer: Self-pay

## 2019-08-17 NOTE — Telephone Encounter (Signed)
Contacted pt to go over lab results pt is aware and doesn't have any questions or concerns 

## 2019-08-19 ENCOUNTER — Other Ambulatory Visit: Payer: Self-pay | Admitting: Internal Medicine

## 2019-08-19 DIAGNOSIS — I10 Essential (primary) hypertension: Secondary | ICD-10-CM

## 2019-08-19 DIAGNOSIS — I4892 Unspecified atrial flutter: Secondary | ICD-10-CM

## 2019-08-19 DIAGNOSIS — R7303 Prediabetes: Secondary | ICD-10-CM

## 2019-08-26 ENCOUNTER — Other Ambulatory Visit: Payer: Self-pay | Admitting: Internal Medicine

## 2019-08-30 ENCOUNTER — Other Ambulatory Visit: Payer: Self-pay | Admitting: Internal Medicine

## 2019-08-30 NOTE — Progress Notes (Signed)
Cardiology Office Note:    Date:  08/31/2019   ID:  Anna [name], DOB Dec 05, 1935, MRN [id number]  PCP:  Ladell Pier, MD  Cardiologist:  No primary care provider on file.    Referring MD: Ladell Pier, MD   Chief Complaint  Patient presents with  . Atrial Fibrillation  . Hypertension    History of Present Illness:    Anna [name] is a 83 y.o. female with a hx of HTN,permanentatrial fibrillation on Xarelto due to her CHADS2VASC score of 3 and mild MR by echo.  She is here today for followup and is doing well.  She denies any chest pain or pressure, PND, orthopnea, dizziness, palpitations or syncope. She has occasional LE edema due to arthritis.  She has chronic DOE with walking.  She is very sedentary and walks with a cane.  She is compliant with her meds and is tolerating meds with no SE.    Past Medical History:  Diagnosis Date  . Hyperlipidemia   . Hypertension   . Osteoarthritis   . Overweight(278.02)   . Permanent atrial fibrillation [facility])     Past Surgical History:  Procedure Laterality Date  . CARDIOVERSION N/A 03/24/2014   Procedure: CARDIOVERSION;  Surgeon: Sueanne Margarita, MD;  Location: [facility];  Service: Cardiovascular;  Laterality: N/A;    Current Medications: Current Meds  Medication Sig  . amlodipine-olmesartan (AZOR) 10-20 MG tablet Take 1 tablet by mouth daily.  . diclofenac sodium (VOLTAREN) 1 % GEL Apply 2 g topically 4 (four) times daily.  Marland Kitchen docusate sodium (COLACE) 100 MG capsule Take 100 mg by mouth daily.   Marland Kitchen gabapentin (NEURONTIN) 100 MG capsule TAKE 1 CAPSULE BY MOUTH THREE TIMES A DAY  . metFORMIN (GLUCOPHAGE) 500 MG tablet Take 1 tablet (500 mg total) by mouth daily with breakfast.  . Multiple Vitamins-Minerals (CENTRUM SILVER PO) Take 1 tablet by mouth daily.   . Omega-3 Fatty Acids (FISH OIL CONCENTRATE PO) Take 1 capsule by mouth daily.   . rivaroxaban (XARELTO) 20 MG TABS tablet Take 1 tablet (20 mg total) by mouth  daily with supper.  . rosuvastatin (CRESTOR) 10 MG tablet TAKE 1 TABLET BY MOUTH EVERY DAY     Allergies:   Patient has no known allergies.   Social History   Socioeconomic History  . Marital status: Divorced    Spouse name: Not on file  . Number of children: Not on file  . Years of education: Not on file  . Highest education level: Not on file  Occupational History  . Not on file  Social Needs  . Financial resource strain: Not on file  . Food insecurity    Worry: Not on file    Inability: Not on file  . Transportation needs    Medical: Not on file    Non-medical: Not on file  Tobacco Use  . Smoking status: Never Smoker  . Smokeless tobacco: Never Used  Substance and Sexual Activity  . Alcohol use: No  . Drug use: No  . Sexual activity: Not on file  Lifestyle  . Physical activity    Days per week: Not on file    Minutes per session: Not on file  . Stress: Not on file  Relationships  . Social Herbalist on phone: Not on file    Gets together: Not on file    Attends religious service: Not on file    Active member of club or  organization: Not on file    Attends meetings of clubs or organizations: Not on file    Relationship status: Not on file  Other Topics Concern  . Not on file  Social History Narrative  . Not on file     Family History: The patient's family history includes Alzheimer's disease in her mother.  ROS:   Please see the history of present illness.    ROS  All other systems reviewed and negative.   EKGs/Labs/Other Studies Reviewed:    The following studies were reviewed today: 2D echo  EKG:  EKG is  ordered today.  The ekg ordered today demonstrates artial fibrillation/flutter at 61bpm with LAD and LAFB, ILBBB  Recent Labs: 05/10/2019: Hemoglobin 10.4; Platelets 269 08/13/2019: ALT 9; BUN 15; Creatinine, Ser 1.07; Potassium 4.5; Sodium 138   Recent Lipid Panel    Component Value Date/Time   CHOL 98 (L) 06/29/2015 0945   TRIG  72 06/29/2015 0945   HDL 51 06/29/2015 0945   CHOLHDL 1.9 06/29/2015 0945   VLDL 14 06/29/2015 0945   LDLCALC 33 06/29/2015 0945    Physical Exam:    VS:  BP (!) 132/54   Pulse 61   Ht 5\' 4"  (1.626 m)   Wt 193 lb 9.6 oz (87.8 kg)   BMI 33.23 kg/m     Wt Readings from Last 3 Encounters:  08/31/19 193 lb 9.6 oz (87.8 kg)  08/13/19 194 lb 9.6 oz (88.3 kg)  02/18/19 190 lb (86.2 kg)     GEN:  Well nourished, well developed in no acute distress HEENT: Normal NECK: No JVD; No carotid bruits LYMPHATICS: No lymphadenopathy CARDIAC: irregularly irregular, no murmurs, rubs, gallops RESPIRATORY:  Clear to auscultation without rales, wheezing or rhonchi  ABDOMEN: Soft, non-tender, non-distended MUSCULOSKELETAL:  No edema; No deformity  SKIN: Warm and dry NEUROLOGIC:  Alert and oriented x 3 PSYCHIATRIC:  Normal affect   ASSESSMENT:    1. Permanent atrial fibrillation (HCC)   2. Essential hypertension, benign   3. Nonrheumatic mitral valve regurgitation   4. Sinus bradycardia   5. Atrial flutter, unspecified type (HCC)    PLAN:    In order of problems listed above:  1. Permanent atrial fibrillation/flutter -HR is well controlled -denies any problems with bleeding -continue Xarelto 20mg  daily -creatinine 1 last month -Hbg 10.4 in July  2.  HTN -BP controlled -continue amlodipine/olmesartan 10-20mg  daily  3.  Mitral regurgitation -mild MR by echo 2019  4.  Sinus bradycardia -asymptomatic   Medication Adjustments/Labs and Tests Ordered: Current medicines are reviewed at length with the patient today.  Concerns regarding medicines are outlined above.  No orders of the defined types were placed in this encounter.  No orders of the defined types were placed in this encounter.   Signed, [birth year]-14-1980, MD  08/31/2019 2:36 PM    [facility]

## 2019-08-31 ENCOUNTER — Ambulatory Visit (INDEPENDENT_AMBULATORY_CARE_PROVIDER_SITE_OTHER): Payer: Medicare Other | Admitting: Cardiology

## 2019-08-31 ENCOUNTER — Other Ambulatory Visit: Payer: Self-pay

## 2019-08-31 ENCOUNTER — Encounter: Payer: Self-pay | Admitting: Cardiology

## 2019-08-31 VITALS — BP 132/54 | HR 61 | Ht 64.0 in | Wt 193.6 lb

## 2019-08-31 DIAGNOSIS — I4892 Unspecified atrial flutter: Secondary | ICD-10-CM

## 2019-08-31 DIAGNOSIS — R001 Bradycardia, unspecified: Secondary | ICD-10-CM | POA: Diagnosis not present

## 2019-08-31 DIAGNOSIS — I4821 Permanent atrial fibrillation: Secondary | ICD-10-CM | POA: Diagnosis not present

## 2019-08-31 DIAGNOSIS — I340 Nonrheumatic mitral (valve) insufficiency: Secondary | ICD-10-CM | POA: Diagnosis not present

## 2019-08-31 DIAGNOSIS — I10 Essential (primary) hypertension: Secondary | ICD-10-CM

## 2019-08-31 MED ORDER — RIVAROXABAN 20 MG PO TABS: 20.0000 mg | ORAL_TABLET | Freq: Every day | ORAL | 2 refills | Status: DC

## 2019-08-31 MED ORDER — ROSUVASTATIN CALCIUM 10 MG PO TABS: 10.0000 mg | ORAL_TABLET | Freq: Every day | ORAL | 0 refills | Status: DC

## 2019-08-31 NOTE — Patient Instructions (Signed)
Medication Instructions:    Your physician recommends that you continue on your current medications as directed. Please refer to the Current Medication list given to you today.  *If you need a refill on your cardiac medications before your next appointment, please call your pharmacy*    Follow-Up: At [facility], you and your health needs are our priority.  As part of our continuing mission to provide you with exceptional heart care, we have created designated Provider Care Teams.  These Care Teams include your primary Cardiologist (physician) and Advanced Practice Providers (APPs -  Physician Assistants and Nurse Practitioners) who all work together to provide you with the care you need, when you need it.  Your next appointment:   6 months  The format for your next appointment:   Virtual Visit   Provider:   You may see  or one of the following Advanced Practice Providers on your designated Care Team:    Melina Copa, PA-C  Ermalinda Barrios, PA-C    Follow-Up: At [facility], you and your health needs are our priority.  As part of our continuing mission to provide you with exceptional heart care, we have created designated Provider Care Teams.  These Care Teams include your primary Cardiologist (physician) and Advanced Practice Providers (APPs -  Physician Assistants and Nurse Practitioners) who all work together to provide you with the care you need, when you need it.  Your next appointment:   12 months  The format for your next appointment:   Either In Person or Virtual  Provider:   Fransico Him, MD

## 2019-09-20 ENCOUNTER — Telehealth: Payer: Self-pay

## 2019-09-20 NOTE — Telephone Encounter (Signed)
Contacted pt to schedule 4 month f/u pt didn't answer lvm asking pt to give a call back to schedule  

## 2019-10-19 ENCOUNTER — Telehealth: Payer: Self-pay | Admitting: Internal Medicine

## 2019-10-19 NOTE — Telephone Encounter (Signed)
-----   Message from Ena Dawley sent at 10/18/2019 12:25 PM EST ----- I called [facility]  they said  that she will be schedule on January  . ----- Message ----- From: Ladell Pier, MD Sent: 08/13/2019   5:57 PM EST To: Ena Dawley  Patient states she has not received a call as yet from the nephrologist for an appointment.  Referral submitted in July.  Can you please call [facility] and find out whether they have offered the patient an appointment as yet.  Thanks

## 2019-11-14 ENCOUNTER — Other Ambulatory Visit: Payer: Self-pay | Admitting: Internal Medicine

## 2019-11-14 DIAGNOSIS — R7303 Prediabetes: Secondary | ICD-10-CM

## 2019-11-14 DIAGNOSIS — I10 Essential (primary) hypertension: Secondary | ICD-10-CM

## 2019-11-23 ENCOUNTER — Other Ambulatory Visit: Payer: Self-pay | Admitting: Cardiology

## 2019-12-17 ENCOUNTER — Other Ambulatory Visit: Payer: Self-pay | Admitting: Internal Medicine

## 2019-12-17 DIAGNOSIS — M170 Bilateral primary osteoarthritis of knee: Secondary | ICD-10-CM

## 2019-12-20 ENCOUNTER — Other Ambulatory Visit: Payer: Self-pay | Admitting: Cardiology

## 2019-12-20 DIAGNOSIS — I4892 Unspecified atrial flutter: Secondary | ICD-10-CM

## 2019-12-20 NOTE — Telephone Encounter (Signed)
Prescription refill request for Xarelto received.   Last office visit: Mayford Knife 08/31/2019 Weight: 87.8kg Age: 84 y.o. Scr: 1.07, 08/13/2019 CrCl: 56 ml/min   Prescription refill sent.

## 2019-12-24 ENCOUNTER — Other Ambulatory Visit: Payer: Self-pay | Admitting: Internal Medicine

## 2019-12-24 DIAGNOSIS — R7303 Prediabetes: Secondary | ICD-10-CM

## 2019-12-24 DIAGNOSIS — I10 Essential (primary) hypertension: Secondary | ICD-10-CM

## 2020-01-03 DIAGNOSIS — N189 Chronic kidney disease, unspecified: Secondary | ICD-10-CM | POA: Diagnosis not present

## 2020-01-03 DIAGNOSIS — E119 Type 2 diabetes mellitus without complications: Secondary | ICD-10-CM | POA: Diagnosis not present

## 2020-01-03 DIAGNOSIS — D631 Anemia in chronic kidney disease: Secondary | ICD-10-CM | POA: Diagnosis not present

## 2020-01-03 DIAGNOSIS — N1831 Chronic kidney disease, stage 3a: Secondary | ICD-10-CM | POA: Diagnosis not present

## 2020-01-03 DIAGNOSIS — I129 Hypertensive chronic kidney disease with stage 1 through stage 4 chronic kidney disease, or unspecified chronic kidney disease: Secondary | ICD-10-CM | POA: Diagnosis not present

## 2020-01-03 DIAGNOSIS — E1122 Type 2 diabetes mellitus with diabetic chronic kidney disease: Secondary | ICD-10-CM | POA: Diagnosis not present

## 2020-01-03 DIAGNOSIS — I4891 Unspecified atrial fibrillation: Secondary | ICD-10-CM | POA: Diagnosis not present

## 2020-01-05 ENCOUNTER — Other Ambulatory Visit: Payer: Self-pay | Admitting: Nephrology

## 2020-01-05 DIAGNOSIS — N1831 Chronic kidney disease, stage 3a: Secondary | ICD-10-CM

## 2020-01-11 ENCOUNTER — Encounter (HOSPITAL_COMMUNITY): Payer: Medicare Other

## 2020-01-19 ENCOUNTER — Ambulatory Visit
Admission: RE | Admit: 2020-01-19 | Discharge: 2020-01-19 | Disposition: A | Discharge status: Home / Self Care | Payer: Medicare Other | Source: Ambulatory Visit | Attending: Nephrology | Admitting: Nephrology

## 2020-01-19 DIAGNOSIS — N183 Chronic kidney disease, stage 3 unspecified: Secondary | ICD-10-CM | POA: Diagnosis not present

## 2020-01-19 DIAGNOSIS — N281 Cyst of kidney, acquired: Secondary | ICD-10-CM | POA: Diagnosis not present

## 2020-01-19 DIAGNOSIS — N1831 Chronic kidney disease, stage 3a: Secondary | ICD-10-CM

## 2020-02-07 ENCOUNTER — Other Ambulatory Visit: Payer: Self-pay | Admitting: Internal Medicine

## 2020-02-07 DIAGNOSIS — M170 Bilateral primary osteoarthritis of knee: Secondary | ICD-10-CM

## 2020-02-07 DIAGNOSIS — I10 Essential (primary) hypertension: Secondary | ICD-10-CM

## 2020-02-07 DIAGNOSIS — R7303 Prediabetes: Secondary | ICD-10-CM

## 2020-02-23 ENCOUNTER — Other Ambulatory Visit: Payer: Self-pay | Admitting: Internal Medicine

## 2020-02-23 ENCOUNTER — Encounter: Payer: Self-pay | Admitting: Internal Medicine

## 2020-02-23 DIAGNOSIS — R7303 Prediabetes: Secondary | ICD-10-CM

## 2020-02-23 DIAGNOSIS — I10 Essential (primary) hypertension: Secondary | ICD-10-CM

## 2020-02-23 DIAGNOSIS — M170 Bilateral primary osteoarthritis of knee: Secondary | ICD-10-CM

## 2020-02-23 NOTE — Progress Notes (Signed)
Patient seen 01/03/2020 by Dr. Arrie Aran.  Patient assessed to have stage IIIa chronic kidney disease presumably due to hypertensive nephrosclerosis.  Baseline creatinine fluctuates between 0.9-1.1 since at least 2016.  No evidence of proteinuria or hematuria.  Estimated GFR fluctuated from 47-59 over the last several years.  Plan is to order renal ultrasound but otherwise continue with education and monitoring per her PCP.  If renal function remains stable she does not need to follow-up with him.  Labs done revealed creatinine of 1.12 with GFR of 46.  LFTs were normal.  Patient with anemia of CKD.  Ferritin level 438, iron saturation 18%, iron level 68, H/H 10.8/32.4.

## 2020-03-02 ENCOUNTER — Other Ambulatory Visit: Payer: Self-pay | Admitting: Internal Medicine

## 2020-03-02 DIAGNOSIS — R7303 Prediabetes: Secondary | ICD-10-CM

## 2020-03-02 DIAGNOSIS — M170 Bilateral primary osteoarthritis of knee: Secondary | ICD-10-CM

## 2020-03-07 ENCOUNTER — Ambulatory Visit: Payer: Medicare Other | Attending: Internal Medicine | Admitting: Internal Medicine

## 2020-03-07 ENCOUNTER — Other Ambulatory Visit: Payer: Self-pay

## 2020-03-07 DIAGNOSIS — R7303 Prediabetes: Secondary | ICD-10-CM | POA: Diagnosis not present

## 2020-03-07 DIAGNOSIS — D631 Anemia in chronic kidney disease: Secondary | ICD-10-CM | POA: Diagnosis not present

## 2020-03-07 DIAGNOSIS — I10 Essential (primary) hypertension: Secondary | ICD-10-CM | POA: Diagnosis not present

## 2020-03-07 DIAGNOSIS — M170 Bilateral primary osteoarthritis of knee: Secondary | ICD-10-CM

## 2020-03-07 DIAGNOSIS — N1831 Chronic kidney disease, stage 3a: Secondary | ICD-10-CM | POA: Insufficient documentation

## 2020-03-07 MED ORDER — METFORMIN HCL 500 MG PO TABS: ORAL_TABLET | ORAL | 1 refills | Status: DC

## 2020-03-07 MED ORDER — DICLOFENAC SODIUM 1 % EX GEL: CUTANEOUS | 3 refills | Status: DC

## 2020-03-07 MED ORDER — GABAPENTIN 100 MG PO CAPS: 100.0000 mg | ORAL_CAPSULE | Freq: Three times a day (TID) | ORAL | 3 refills | Status: DC

## 2020-03-07 MED ORDER — AMLODIPINE-OLMESARTAN 10-20 MG PO TABS: 1.0000 | ORAL_TABLET | Freq: Every day | ORAL | 3 refills | Status: DC

## 2020-03-07 NOTE — Progress Notes (Signed)
Virtual Visit via Telephone Note Due to current restrictions/limitations of in-office visits due to the COVID-19 pandemic, this scheduled clinical appointment was converted to a telehealth visit  I connected with [name] on 03/07/20 at  1:30 PM EDT by telephone and verified that I am speaking with the correct person using two identifiers. I am in my office.  The patient is at home.  Only the patient and myself participated in this encounter.  I discussed the limitations, risks, security and privacy concerns of performing an evaluation and management service by telephone and the availability of in person appointments. I also discussed with the patient that there may be a patient responsible charge related to this service. The patient expressed understanding and agreed to proceed.   History of Present Illness: history of atrial flutter on Xarelto with history of cardioversion 2015, HTN,trivialAR, mild MR,OA knees, HL, prediabetes, CKD 3, ACD.  Purpose of today's visit is chronic disease management.  Last seen 07/2019.  OA knees:  Reports knees getting worse.  Can not stand long enough in her kitchen to cook so she makes sandwiches.  Would like a wheelchair to use in her kitchen. Her daughter lives about 1 mile from her and helps with meal preps.   Fall 10/2019 while "backing out of the refrigerator."  Larey Seat forward and "busted my nose." Called EMS but decline to go to ER. Larey Seat again 2 wks later when she tripped over a trash bag.  Uses her cane in the house.  Also has Life Alert.  CKD 3:  Patient seen 01/03/2020 by Dr. Arrie Aran.  Patient assessed to have stage IIIa chronic kidney disease presumably due to hypertensive nephrosclerosis.  Baseline creatinine fluctuates between 0.9-1.1 since at least 2016.  No evidence of proteinuria or hematuria.  Estimated GFR fluctuated from 47-59 over the last several years.  Plan is to order renal ultrasound but otherwise continue with education and monitoring  per her PCP.  If renal function remains stable she does not need to follow-up with him.  Labs done revealed creatinine of 1.12 with GFR of 46.  LFTs were normal.  Patient with anemia of CKD.  Ferritin level 438, iron saturation 18%, iron level 68, H/H 10.8/32.4.  HTN:  Compliant with Azor. No CP/SOB. Some LE edema at times.  Goes down with elev of feet  PreDM: compliant with Metformin  HM:  Got the J&J vaccine  Outpatient Encounter Medications as of 03/07/2020  Medication Sig  . amlodipine-olmesartan (AZOR) 10-20 MG tablet TAKE 1 TABLET BY MOUTH EVERY DAY  . diclofenac Sodium (VOLTAREN) 1 % GEL APPLY 2 GRAMS TO AFFECTED AREA 4 TIMES A DAY  . docusate sodium (COLACE) 100 MG capsule Take 100 mg by mouth daily.   Marland Kitchen gabapentin (NEURONTIN) 100 MG capsule TAKE 1 CAPSULE BY MOUTH THREE TIMES A DAY  . metFORMIN (GLUCOPHAGE) 500 MG tablet TAKE 1 TABLET BY MOUTH EVERY DAY WITH BREAKFAST  . Multiple Vitamins-Minerals (CENTRUM SILVER PO) Take 1 tablet by mouth daily.   . Omega-3 Fatty Acids (FISH OIL CONCENTRATE PO) Take 1 capsule by mouth daily.   . rosuvastatin (CRESTOR) 10 MG tablet TAKE 1 TABLET BY MOUTH EVERY DAY  . XARELTO 20 MG TABS tablet TAKE 1 TABLET BY MOUTH EVERY DAY WITH SUPPER   No facility-administered encounter medications on file as of 03/07/2020.      Observations/Objective: No direct observation done as this was a telephone encounter.  Assessment and Plan: 1. Primary osteoarthritis of both knees Prescription  for manual wheelchair will be mailed to her. I recommended some home physical therapy but patient declines.  I told her that she is at risk for more falls which can be serious given that she is on a blood thinner Xarelto.  However patient still declines stating that she does not want anyone in her house.  She also declines home health aide and referral to orthopedics. - diclofenac Sodium (VOLTAREN) 1 % GEL; APPLY 2 GRAMS TO AFFECTED AREA 4 TIMES A DAY  Dispense: 350 g;  Refill: 3 - gabapentin (NEURONTIN) 100 MG capsule; Take 1 capsule (100 mg total) by mouth 3 (three) times daily.  Dispense: 270 capsule; Refill: 3  2. Essential hypertension Continue current medication and low-salt diet - amlodipine-olmesartan (AZOR) 10-20 MG tablet; Take 1 tablet by mouth daily.  Dispense: 90 tablet; Refill: 3  3. Prediabetes Continue Metformin - metFORMIN (GLUCOPHAGE) 500 MG tablet; TAKE 1 TABLET BY MOUTH EVERY DAY WITH BREAKFAST  Dispense: 90 tablet; Refill: 1  4. Anemia due to stage 3a chronic kidney disease Patient tells me that she is now taking iron supplement once a day as recommended by the kidney specialist  5. Stage 3a chronic kidney disease GFR is stable.  We will continue to monitor.   Follow Up Instructions: 4 mths   I discussed the assessment and treatment plan with the patient. The patient was provided an opportunity to ask questions and all were answered. The patient agreed with the plan and demonstrated an understanding of the instructions.   The patient was advised to call back or seek an in-person evaluation if the symptoms worsen or if the condition fails to improve as anticipated.  I provided 15 minutes of non-face-to-face time during this encounter.   Karle Plumber, MD

## 2020-03-19 NOTE — Progress Notes (Signed)
Virtual Visit via Telephone Note   This visit type was conducted due to national recommendations for restrictions regarding the COVID-19 Pandemic (e.g. social distancing) in an effort to limit this patient's exposure and mitigate transmission in our community.  Due to her co-morbid illnesses, this patient is at least at moderate risk for complications without adequate follow up.  This format is felt to be most appropriate for this patient at this time.  All issues noted in this document were discussed and addressed.  A limited physical exam was performed with this format.  Please refer to the patient's chart for her consent to telehealth for [facility].   Evaluation Performed:  Follow-up visit  This visit type was conducted due to national recommendations for restrictions regarding the COVID-19 Pandemic (e.g. social distancing).  This format is felt to be most appropriate for this patient at this time.  All issues noted in this document were discussed and addressed.  No physical exam was performed (except for noted visual exam findings with Video Visits).  Please refer to the patient's chart (MyChart message for video visits and phone note for telephone visits) for the patient's consent to telehealth for [facility].  Date:  03/20/2020   ID:  Anna [name], DOB 1936-03-04, MRN [id number]  Patient Location:  Home  Provider location:   [place]  PCP:  Ladell Pier, MD  Cardiologist:  Fransico Him, MD  Electrophysiologist:  None   Chief Complaint:  AF, HTN  History of Present Illness:    Anna [name] is a 84 y.o. female who presents via audio/video conferencing for a telehealth visit today.    Anna [name] is a 84 y.o. female with a hx of HTN,permanentatrial fibrillation on Xarelto due to her CHADS2VASC score of [place] MR by echo.  She is here today for followup and is doing well.  She denies any chest pain or pressure, SOB, DOE, PND, orthopnea, LE edema,  dizziness, palpitations or syncope. She is compliant with her meds and is tolerating meds with no SE.    The patient does not have symptoms concerning for COVID-19 infection (fever, chills, cough, or new shortness of breath).   Prior CV studies:   The following studies were reviewed today:  none  Past Medical History:  Diagnosis Date  . Hyperlipidemia   . Hypertension   . Osteoarthritis   . Overweight(278.02)   . Permanent atrial fibrillation [facility])    Past Surgical History:  Procedure Laterality Date  . CARDIOVERSION N/A 03/24/2014   Procedure: CARDIOVERSION;  Surgeon: Sueanne Margarita, MD;  Location: [facility]OS[facility];  Service: Cardiovascular;  Laterality: N/A;     Current Meds  Medication Sig  . amlodipine-olmesartan (AZOR) 10-20 MG tablet Take 1 tablet by mouth daily.  . diclofenac Sodium (VOLTAREN) 1 % GEL APPLY 2 GRAMS TO AFFECTED AREA 4 TIMES A DAY  . docusate sodium (COLACE) 100 MG capsule Take 100 mg by mouth daily.   . ferrous sulfate 325 (65 FE) MG tablet Take 325 mg by mouth daily with breakfast.  . gabapentin (NEURONTIN) 100 MG capsule Take 1 capsule (100 mg total) by mouth 3 (three) times daily.  . metFORMIN (GLUCOPHAGE) 500 MG tablet TAKE 1 TABLET BY MOUTH EVERY DAY WITH BREAKFAST  . Multiple Vitamins-Minerals (CENTRUM SILVER PO) Take 1 tablet by mouth daily.   . Omega-3 Fatty Acids (FISH OIL CONCENTRATE PO) Take 1 capsule by mouth daily.   . rosuvastatin (CRESTOR) 10 MG tablet TAKE 1 TABLET  BY MOUTH EVERY DAY  . XARELTO 20 MG TABS tablet TAKE 1 TABLET BY MOUTH EVERY DAY WITH SUPPER     Allergies:   Patient has no known allergies.   Social History   Tobacco Use  . Smoking status: Never Smoker  . Smokeless tobacco: Never Used  Substance Use Topics  . Alcohol use: No  . Drug use: No     Family Hx: The patient's family history includes Alzheimer's disease in her mother.  ROS:   Please see the history of present illness.     All other systems reviewed and are  negative.   Labs/Other Tests and Data Reviewed:    Recent Labs: 05/10/2019: Hemoglobin 10.4; Platelets 269 08/13/2019: ALT 9; BUN 15; Creatinine, Ser 1.07; Potassium 4.5; Sodium 138   Recent Lipid Panel Lab Results  Component Value Date/Time   CHOL 98 (L) 06/29/2015 09:45 AM   TRIG 72 06/29/2015 09:45 AM   HDL 51 06/29/2015 09:45 AM   CHOLHDL 1.9 06/29/2015 09:45 AM   LDLCALC 33 06/29/2015 09:45 AM    Wt Readings from Last 3 Encounters:  03/20/20 189 lb (85.7 kg)  08/31/19 193 lb 9.6 oz (87.8 kg)  08/13/19 194 lb 9.6 oz (88.3 kg)     Objective:    Vital Signs:  Ht 5\' 4"  (1.626 m)   Wt 189 lb (85.7 kg)   BMI 32.44 kg/m     ASSESSMENT & PLAN:    1.  Permanent Atrial Fibrillation -her HR has been controlled with no rate controlling meds due to hx of bradycardia -denies any bleeding problems on DOAC -continue Xarelto 20mg  daily Check BMET and CBC  2.  HTN -continue amlodipine/olmesartan 10-20mg  daily  3.  Mitral regurgitation -mild by echo 2019 -repeat echo   Time:   Today, I have spent 20 minutes on telemedicine discussing medical problems including Afib, HTN, MR and reviewing patient's chart including outside labs from PCP.  Medication Adjustments/Labs and Tests Ordered: Current medicines are reviewed at length with the patient today.  Concerns regarding medicines are outlined above.  Tests Ordered: No orders of the defined types were placed in this encounter.  Medication Changes: No orders of the defined types were placed in this encounter.   Disposition:  Follow up in 1 year(s)  Signed, , MD  03/20/2020 8:53 AM    [facility]

## 2020-03-20 ENCOUNTER — Encounter: Payer: Self-pay | Admitting: Cardiology

## 2020-03-20 ENCOUNTER — Other Ambulatory Visit: Payer: Self-pay

## 2020-03-20 ENCOUNTER — Telehealth (INDEPENDENT_AMBULATORY_CARE_PROVIDER_SITE_OTHER): Payer: Medicare Other | Admitting: Cardiology

## 2020-03-20 VITALS — Ht 64.0 in | Wt 189.0 lb

## 2020-03-20 DIAGNOSIS — I10 Essential (primary) hypertension: Secondary | ICD-10-CM | POA: Diagnosis not present

## 2020-03-20 DIAGNOSIS — I340 Nonrheumatic mitral (valve) insufficiency: Secondary | ICD-10-CM | POA: Diagnosis not present

## 2020-03-20 DIAGNOSIS — I4821 Permanent atrial fibrillation: Secondary | ICD-10-CM

## 2020-03-20 NOTE — Addendum Note (Signed)
Addended by: Theresia Majors on: 03/20/2020 09:13 AM   Modules accepted: Orders

## 2020-03-20 NOTE — Patient Instructions (Signed)
Medication Instructions:  Your physician recommends that you continue on your current medications as directed. Please refer to the Current Medication list given to you today.  *If you need a refill on your cardiac medications before your next appointment, please call your pharmacy*   Lab Work: BMET and CBC If you have labs (blood work) drawn today and your tests are completely normal, you will receive your results only by: Marland Kitchen MyChart Message (if you have MyChart) OR . A paper copy in the mail If you have any lab test that is abnormal or we need to change your treatment, we will call you to review the results.   Testing/Procedures: Your physician has requested that you have an echocardiogram. Echocardiography is a painless test that uses sound waves to create images of your heart. It provides your doctor with information about the size and shape of your heart and how well your heart's chambers and valves are working. This procedure takes approximately one hour. There are no restrictions for this procedure.   Follow-Up: At [facility], you and your health needs are our priority.  As part of our continuing mission to provide you with exceptional heart care, we have created designated Provider Care Teams.  These Care Teams include your primary Cardiologist (physician) and Advanced Practice Providers (APPs -  Physician Assistants and Nurse Practitioners) who all work together to provide you with the care you need, when you need it.  We recommend signing up for the patient portal called "MyChart".  Sign up information is provided on this After Visit Summary.  MyChart is used to connect with patients for Virtual Visits (Telemedicine).  Patients are able to view lab/test results, encounter notes, upcoming appointments, etc.  Non-urgent messages can be sent to your provider as well.   To learn more about what you can do with MyChart, go to [email or web address].    Your next appointment:   1  year(s)  The format for your next appointment:   In Person  Provider:   Armanda Magic, MD

## 2020-03-30 DIAGNOSIS — M179 Osteoarthritis of knee, unspecified: Secondary | ICD-10-CM | POA: Diagnosis not present

## 2020-04-08 ENCOUNTER — Other Ambulatory Visit: Payer: Self-pay | Admitting: Internal Medicine

## 2020-04-08 DIAGNOSIS — M170 Bilateral primary osteoarthritis of knee: Secondary | ICD-10-CM

## 2020-04-12 ENCOUNTER — Other Ambulatory Visit: Payer: Medicare Other | Admitting: *Deleted

## 2020-04-12 ENCOUNTER — Other Ambulatory Visit: Payer: Self-pay

## 2020-04-12 ENCOUNTER — Ambulatory Visit (HOSPITAL_COMMUNITY): Payer: Medicare Other | Attending: Cardiovascular Disease

## 2020-04-12 DIAGNOSIS — I340 Nonrheumatic mitral (valve) insufficiency: Secondary | ICD-10-CM | POA: Diagnosis not present

## 2020-04-12 DIAGNOSIS — I4821 Permanent atrial fibrillation: Secondary | ICD-10-CM

## 2020-04-13 ENCOUNTER — Telehealth: Payer: Self-pay

## 2020-04-13 DIAGNOSIS — I340 Nonrheumatic mitral (valve) insufficiency: Secondary | ICD-10-CM

## 2020-04-13 LAB — BASIC METABOLIC PANEL
BUN/Creatinine Ratio: 18 (ref 12–28)
BUN: 20 mg/dL (ref 8–27)
CO2: 24 mmol/L (ref 20–29)
Calcium: 9.9 mg/dL (ref 8.7–10.3)
Chloride: 101 mmol/L (ref 96–106)
Creatinine, Ser: 1.09 mg/dL — ABNORMAL HIGH (ref 0.57–1.00)
GFR calc Af Amer: 55 mL/min/{1.73_m2} — ABNORMAL LOW (ref >59)
GFR calc non Af Amer: 47 mL/min/{1.73_m2} — ABNORMAL LOW (ref >59)
Glucose: 100 mg/dL — ABNORMAL HIGH (ref 65–99)
Potassium: 4.8 mmol/L (ref 3.5–5.2)
Sodium: 139 mmol/L (ref 134–144)

## 2020-04-13 LAB — CBC
Hematocrit: 35.7 % (ref 34.0–46.6)
Hemoglobin: 11.4 g/dL (ref 11.1–15.9)
MCH: 28.1 pg (ref 26.6–33.0)
MCHC: 31.9 g/dL (ref 31.5–35.7)
MCV: 88 fL (ref 79–97)
Platelets: 225 10*3/uL (ref 150–450)
RBC: 4.06 x10E6/uL (ref 3.77–5.28)
RDW: 13.3 % (ref 11.7–15.4)
WBC: 6 10*3/uL (ref 3.4–10.8)

## 2020-04-13 NOTE — Telephone Encounter (Signed)
-----   Message from Quintella Reichert, MD sent at 04/12/2020  9:20 PM EDT ----- Echo showed normal heart function with severely enlarged LA and RA, mild mitral stenosis and mildly leaky MV.  Repeat echo in 1 year

## 2020-04-13 NOTE — Telephone Encounter (Signed)
The patient has been notified of the result and verbalized understanding.  All questions (if any) were answered. Theresia Majors, RN 04/13/2020 11:53 AM

## 2020-04-29 DIAGNOSIS — M179 Osteoarthritis of knee, unspecified: Secondary | ICD-10-CM | POA: Diagnosis not present

## 2020-05-27 ENCOUNTER — Other Ambulatory Visit: Payer: Self-pay | Admitting: Internal Medicine

## 2020-05-27 DIAGNOSIS — M170 Bilateral primary osteoarthritis of knee: Secondary | ICD-10-CM

## 2020-05-30 DIAGNOSIS — M179 Osteoarthritis of knee, unspecified: Secondary | ICD-10-CM | POA: Diagnosis not present

## 2020-06-22 DIAGNOSIS — H43813 Vitreous degeneration, bilateral: Secondary | ICD-10-CM | POA: Diagnosis not present

## 2020-06-22 DIAGNOSIS — Z961 Presence of intraocular lens: Secondary | ICD-10-CM | POA: Diagnosis not present

## 2020-06-22 DIAGNOSIS — H10413 Chronic giant papillary conjunctivitis, bilateral: Secondary | ICD-10-CM | POA: Diagnosis not present

## 2020-06-22 DIAGNOSIS — H26493 Other secondary cataract, bilateral: Secondary | ICD-10-CM | POA: Diagnosis not present

## 2020-06-22 DIAGNOSIS — H02831 Dermatochalasis of right upper eyelid: Secondary | ICD-10-CM | POA: Diagnosis not present

## 2020-06-30 DIAGNOSIS — M179 Osteoarthritis of knee, unspecified: Secondary | ICD-10-CM | POA: Diagnosis not present

## 2020-07-06 ENCOUNTER — Other Ambulatory Visit: Payer: Self-pay | Admitting: Internal Medicine

## 2020-07-06 DIAGNOSIS — M170 Bilateral primary osteoarthritis of knee: Secondary | ICD-10-CM

## 2020-07-07 ENCOUNTER — Ambulatory Visit: Payer: Medicare Other | Attending: Internal Medicine | Admitting: Internal Medicine

## 2020-07-07 ENCOUNTER — Encounter: Payer: Self-pay | Admitting: Internal Medicine

## 2020-07-07 ENCOUNTER — Other Ambulatory Visit: Payer: Self-pay

## 2020-07-07 ENCOUNTER — Ambulatory Visit (HOSPITAL_BASED_OUTPATIENT_CLINIC_OR_DEPARTMENT_OTHER): Payer: Medicare Other | Admitting: Pharmacist

## 2020-07-07 VITALS — BP 138/60 | HR 56 | Temp 97.7°F | Resp 16 | Ht 63.0 in | Wt 188.2 lb

## 2020-07-07 DIAGNOSIS — R7303 Prediabetes: Secondary | ICD-10-CM

## 2020-07-07 DIAGNOSIS — Z23 Encounter for immunization: Secondary | ICD-10-CM

## 2020-07-07 DIAGNOSIS — I10 Essential (primary) hypertension: Secondary | ICD-10-CM | POA: Diagnosis not present

## 2020-07-07 DIAGNOSIS — E785 Hyperlipidemia, unspecified: Secondary | ICD-10-CM

## 2020-07-07 DIAGNOSIS — M170 Bilateral primary osteoarthritis of knee: Secondary | ICD-10-CM | POA: Diagnosis not present

## 2020-07-07 DIAGNOSIS — D638 Anemia in other chronic diseases classified elsewhere: Secondary | ICD-10-CM

## 2020-07-07 DIAGNOSIS — N1831 Chronic kidney disease, stage 3a: Secondary | ICD-10-CM | POA: Diagnosis not present

## 2020-07-07 LAB — POCT GLYCOSYLATED HEMOGLOBIN (HGB A1C): Hemoglobin A1C: 5.6 % (ref 4.0–5.6)

## 2020-07-07 LAB — GLUCOSE, POCT (MANUAL RESULT ENTRY): POC Glucose: 113 mg/dl — AB (ref 70–99)

## 2020-07-07 MED ORDER — METFORMIN HCL 500 MG PO TABS: 250.0000 mg | ORAL_TABLET | Freq: Every day | ORAL | 1 refills | Status: DC

## 2020-07-07 NOTE — Progress Notes (Signed)
Patient presents for vaccination against influenza per orders of Dr. Johnson. Consent given. Counseling provided. No contraindications exists. Vaccine administered without incident.  ° °Luke Van Ausdall, PharmD, CPP °Clinical Pharmacist °[facility] °[phone] ° °

## 2020-07-07 NOTE — Progress Notes (Signed)
Patient ID: Anna [name], female    DOB: 03/21/1936  MRN: [id number]  CC: Diabetes (prediabetes) and Hypertension   Subjective: Anna [name] is a 84 y.o. female who presents for chronic ds management Her concerns today include:  history of atrial flutter on Xarelto with history of cardioversion 2015, HTN,trivialAR, mild MR,OA knees, HL, prediabetes, CKD 3, ACD.  HM:  Had J&J COVID vaccine in April.  Wants flu shot today.  Declines DEXA scan.  OA knees:  No falls since last visit. Uses her cane consistently indoors and out.. Doing good with Voltaren gel and Gabapentin.   Daughter helps her out with groceries.  Pt does her own cooking. Does her own baths.  Still drives herself.  Feels she will need help soon "but for now I am fighting it as hard as I can."   A.flutter: Denies any bruising or bleeding on Xarelto.  No palpitations.  HYPERTENSION Currently taking: see medication list Med Adherence: [x]  Yes    []  No Medication side effects: []  Yes    []  No Adherence with salt restriction: [x]  Yes    []  No Home Monitoring?: [x]  Yes  She has a log from past 3 days Monitoring Frequency: []  Yes    []  No Home BP results range:  154/66, 149/58, 150/62 SOB? []  Yes    [x]  No Chest Pain?: []  Yes    [x]  No Leg swelling?: []  Yes    [x]  No Headaches?: []  Yes    [x]  No Dizziness? []  Yes    [x]  No Comments:   CKD:  Taking iron daily as recommended by Dr. .  Wants to know if she still needs to take it.  Denies constipation.    HL:  No cramps on Crestor.   PreDM:  Tolerating Metformin.  Doing okay with her eating habits. Results for orders placed or performed in visit on 07/07/20  POCT glucose (manual entry)  Result Value Ref Range   POC Glucose 113 (A) 70 - 99 mg/dl  POCT glycosylated hemoglobin (Hb A1C)  Result Value Ref Range   Hemoglobin A1C 5.6 4.0 - 5.6 %   HbA1c POC (<> result, manual entry)     HbA1c, POC (prediabetic range)     HbA1c, POC (controlled diabetic  range)      Patient Active Problem List   Diagnosis Date Noted  . Anemia due to stage 3a chronic kidney disease 03/07/2020  . Stage 3a chronic kidney disease 03/07/2020  . Mixed incontinence urge and stress 03/09/2018  . Prediabetes 03/09/2018  . OA (osteoarthritis) of knee 03/09/2018  . Sinus bradycardia 12/18/2016  . Estrogen deficiency 07/03/2016  . Mitral regurgitation 03/01/2014  . Atrial flutter (HCC) 03/01/2014  . Permanent atrial fibrillation (HCC) 12/24/2013  . Essential hypertension, benign 12/24/2013  . Heart murmur 12/24/2013  . Hyperlipemia 12/24/2013     Current Outpatient Medications on File Prior to Visit  Medication Sig Dispense Refill  . amlodipine-olmesartan (AZOR) 10-20 MG tablet Take 1 tablet by mouth daily. 90 tablet 3  . diclofenac Sodium (VOLTAREN) 1 % GEL APPLY 2 GRAMS TO AFFECTED AREA 4 TIMES A DAY 400 g 2  . docusate sodium (COLACE) 100 MG capsule Take 100 mg by mouth daily.     . ferrous sulfate 325 (65 FE) MG tablet Take 325 mg by mouth daily with breakfast.    . gabapentin (NEURONTIN) 100 MG capsule Take 1 capsule (100 mg total) by mouth 3 (three) times daily. 270 capsule 3  .  Multiple Vitamins-Minerals (CENTRUM SILVER PO) Take 1 tablet by mouth daily.     . Omega-3 Fatty Acids (FISH OIL CONCENTRATE PO) Take 1 capsule by mouth daily.     . rosuvastatin (CRESTOR) 10 MG tablet TAKE 1 TABLET BY MOUTH EVERY DAY 90 tablet 3  . XARELTO 20 MG TABS tablet TAKE 1 TABLET BY MOUTH EVERY DAY WITH SUPPER 90 tablet 1   No current facility-administered medications on file prior to visit.    No Known Allergies  Social History   Socioeconomic History  . Marital status: Divorced    Spouse name: Not on file  . Number of children: Not on file  . Years of education: Not on file  . Highest education level: Not on file  Occupational History  . Not on file  Tobacco Use  . Smoking status: Never Smoker  . Smokeless tobacco: Never Used  Substance and Sexual  Activity  . Alcohol use: No  . Drug use: No  . Sexual activity: Not on file  Other Topics Concern  . Not on file  Social History Narrative  . Not on file   Social Determinants of Health   Financial Resource Strain:   . Difficulty of Paying Living Expenses: Not on file  Food Insecurity:   . Worried About Programme researcher, broadcasting/film/video in the Last Year: Not on file  . Ran Out of Food in the Last Year: Not on file  Transportation Needs:   . Lack of Transportation (Medical): Not on file  . Lack of Transportation (Non-Medical): Not on file  Physical Activity:   . Days of Exercise per Week: Not on file  . Minutes of Exercise per Session: Not on file  Stress:   . Feeling of Stress : Not on file  Social Connections:   . Frequency of Communication with Friends and Family: Not on file  . Frequency of Social Gatherings with Friends and Family: Not on file  . Attends Religious Services: Not on file  . Active Member of Clubs or Organizations: Not on file  . Attends Banker Meetings: Not on file  . Marital Status: Not on file  Intimate Partner Violence:   . Fear of Current or Ex-Partner: Not on file  . Emotionally Abused: Not on file  . Physically Abused: Not on file  . Sexually Abused: Not on file    Family History  Problem Relation Age of Onset  . Alzheimer's disease Mother     Past Surgical History:  Procedure Laterality Date  . CARDIOVERSION N/A 03/24/2014   Procedure: CARDIOVERSION;  Surgeon: Quintella Reichert, MD;  Location: [facility];  Service: Cardiovascular;  Laterality: N/A;    ROS: Review of Systems Negative except as stated above  PHYSICAL EXAM: BP 138/60   Pulse (!) 56   Temp 97.7 F (36.5 C)   Resp 16   Ht 5\' 3"  (1.6 m)   Wt 188 lb 3.2 oz (85.4 kg)   SpO2 95%   BMI 33.34 kg/m   Wt Readings from Last 3 Encounters:  07/07/20 188 lb 3.2 oz (85.4 kg)  03/20/20 189 lb (85.7 kg)  08/31/19 193 lb 9.6 oz (87.8 kg)    Physical Exam  General appearance -  alert, well appearing, pleasant elderly female and in no distress Mental status - normal mood, behavior, speech, dress, motor activity, and thought processes Neck - supple, no significant adenopathy Chest - clear to auscultation, no wheezes, rales or rhonchi, symmetric air entry Heart -irregularly  irregular but rate controlled.  No gallops or murmurs heard. Musculoskeletal -she has a 4 pronged cane with her.  She has soft knee support on both knees.  Slow passive range of motion of both knees.   Extremities -lower extremity edema.   CMP Latest Ref Rng & Units 04/12/2020 08/13/2019 05/10/2019  Glucose 65 - 99 mg/dL 734(K) 93 91  BUN 8 - 27 mg/dL 20 15 20   Creatinine 0.57 - 1.00 mg/dL ) 8.76(O) 1.15(B)  Sodium 134 - 144 mmol/L 139 138 137  Potassium 3.5 - 5.2 mmol/L 4.8 4.5 5.1  Chloride 96 - 106 mmol/L 101 101 100  CO2 20 - 29 mmol/L 24 23 25   Calcium 8.7 - 10.3 mg/dL 9.9 9.9 9.8  Total Protein 6.0 - 8.5 g/dL - 7.3 -  Total Bilirubin 0.0 - 1.2 mg/dL - 0.4 -  Alkaline Phos 39 - 117 IU/L - 86 -  AST 0 - 40 IU/L - 20 -  ALT 0 - 32 IU/L - 9 -   Lipid Panel     Component Value Date/Time   CHOL 98 (L) 06/29/2015 0945   TRIG 72 06/29/2015 0945   HDL 51 06/29/2015 0945   CHOLHDL 1.9 06/29/2015 0945   VLDL 14 06/29/2015 0945   LDLCALC 33 06/29/2015 0945    CBC    Component Value Date/Time   WBC 6.0 04/12/2020 1359   WBC 6.3 06/19/2016 1503   RBC 4.06 04/12/2020 1359   RBC 4.21 06/19/2016 1503   HGB 11.4 04/12/2020 1359   HCT 35.7 04/12/2020 1359   PLT 225 04/12/2020 1359   MCV 88 04/12/2020 1359   MCH 28.1 04/12/2020 1359   MCH 28.0 06/19/2016 1503   MCHC 31.9 04/12/2020 1359   MCHC 32.3 06/19/2016 1503   RDW 13.3 04/12/2020 1359   LYMPHSABS 1.9 12/18/2016 1452   MONOABS 378 06/19/2016 1503   EOSABS 0.2 12/18/2016 1452   BASOSABS 0.1 12/18/2016 1452    ASSESSMENT AND PLAN:  1. Primary osteoarthritis of both knees Patient will continue Voltaren gel. She feels at  some point in the near future she will need PCS services but does not want to pursue at this time. Encouraged her to continue to use her cane to help prevent falls.  2. Essential hypertension Repeat blood pressure today acceptable.  Given her age and frailty, my goal is to keep systolic blood pressure less than 150 rather than below 130. - CBC  3. Prediabetes Continue healthy eating habits.  Given her A1c today, I think we can decrease the dose of the Metformin to 2.5 mg daily. - POCT glucose (manual entry) - POCT glycosylated hemoglobin (Hb A1C) - metFORMIN (GLUCOPHAGE) 500 MG tablet; Take 0.5 tablets (250 mg total) by mouth daily with breakfast. TAKE 1 TABLET BY MOUTH EVERY DAY WITH BREAKFAST  Dispense: 90 tablet; Refill: 1  4. Stage 3a chronic kidney disease - Comprehensive metabolic panel  5. Anemia, chronic disease She takes iron daily from over-the-counter  6. Need for influenza vaccination given today.  7. Hyperlipidemia, unspecified hyperlipidemia type - Lipid panel  Pt declines Dexa scan  Patient was given the opportunity to ask questions.  Patient verbalized understanding of the plan and was able to repeat key elements of the plan.   Orders Placed This Encounter  Procedures  . CBC  . Comprehensive metabolic panel  . Lipid panel  . POCT glucose (manual entry)  . POCT glycosylated hemoglobin (Hb A1C)     Requested Prescriptions  Signed Prescriptions Disp Refills  . metFORMIN (GLUCOPHAGE) 500 MG tablet 90 tablet 1    Sig: Take 0.5 tablets (250 mg total) by mouth daily with breakfast. TAKE 1 TABLET BY MOUTH EVERY DAY WITH BREAKFAST    Return in about 4 months (around 11/06/2020).  Jonah Blue, MD, FACP

## 2020-07-07 NOTE — Patient Instructions (Signed)
Influenza Virus Vaccine injection (Fluarix) What is this medicine? INFLUENZA VIRUS VACCINE (in floo EN zuh VAHY ruhs vak SEEN) helps to reduce the risk of getting influenza also known as the flu. This medicine may be used for other purposes; ask your health care provider or pharmacist if you have questions. COMMON BRAND NAME(S): Fluarix, Fluzone What should I tell my health care provider before I take this medicine? They need to know if you have any of these conditions:  bleeding disorder like hemophilia  fever or infection  Guillain-Barre syndrome or other neurological problems  immune system problems  infection with the human immunodeficiency virus (HIV) or AIDS  low blood platelet counts  multiple sclerosis  an unusual or allergic reaction to influenza virus vaccine, eggs, chicken proteins, latex, gentamicin, other medicines, foods, dyes or preservatives  pregnant or trying to get pregnant  breast-feeding How should I use this medicine? This vaccine is for injection into a muscle. It is given by a health care professional. A copy of Vaccine Information Statements will be given before each vaccination. Read this sheet carefully each time. The sheet may change frequently. Talk to your pediatrician regarding the use of this medicine in children. Special care may be needed. Overdosage: If you think you have taken too much of this medicine contact a poison control center or emergency room at once. NOTE: This medicine is only for you. Do not share this medicine with others. What if I miss a dose? This does not apply. What may interact with this medicine?  chemotherapy or radiation therapy  medicines that lower your immune system like etanercept, anakinra, infliximab, and adalimumab  medicines that treat or prevent blood clots like warfarin  phenytoin  steroid medicines like prednisone or cortisone  theophylline  vaccines This list may not describe all possible  interactions. Give your health care provider a list of all the medicines, herbs, non-prescription drugs, or dietary supplements you use. Also tell them if you smoke, drink alcohol, or use illegal drugs. Some items may interact with your medicine. What should I watch for while using this medicine? Report any side effects that do not go away within 3 days to your doctor or health care professional. Call your health care provider if any unusual symptoms occur within 6 weeks of receiving this vaccine. You may still catch the flu, but the illness is not usually as bad. You cannot get the flu from the vaccine. The vaccine will not protect against colds or other illnesses that may cause fever. The vaccine is needed every year. What side effects may I notice from receiving this medicine? Side effects that you should report to your doctor or health care professional as soon as possible:  allergic reactions like skin rash, itching or hives, swelling of the face, lips, or tongue Side effects that usually do not require medical attention (report to your doctor or health care professional if they continue or are bothersome):  fever  headache  muscle aches and pains  pain, tenderness, redness, or swelling at site where injected  weak or tired This list may not describe all possible side effects. Call your doctor for medical advice about side effects. You may report side effects to FDA at 1-800-FDA-1088. Where should I keep my medicine? This vaccine is only given in a clinic, pharmacy, doctor's office, or other health care setting and will not be stored at home. NOTE: This sheet is a summary. It may not cover all possible information. If you have questions   about this medicine, talk to your doctor, pharmacist, or health care provider.  2020 Elsevier/Gold Standard (2008-05-11 09:30:40)  

## 2020-07-08 ENCOUNTER — Telehealth: Payer: Self-pay | Admitting: Internal Medicine

## 2020-07-08 ENCOUNTER — Other Ambulatory Visit: Payer: Self-pay | Admitting: Cardiology

## 2020-07-08 DIAGNOSIS — I4892 Unspecified atrial flutter: Secondary | ICD-10-CM

## 2020-07-08 DIAGNOSIS — E875 Hyperkalemia: Secondary | ICD-10-CM

## 2020-07-08 LAB — LIPID PANEL
Chol/HDL Ratio: 2.3 ratio (ref 0.0–4.4)
Cholesterol, Total: 108 mg/dL (ref 100–199)
HDL: 47 mg/dL (ref >39)
LDL Chol Calc (NIH): 47 mg/dL (ref 0–99)
Triglycerides: 67 mg/dL (ref 0–149)
VLDL Cholesterol Cal: 14 mg/dL (ref 5–40)

## 2020-07-08 LAB — COMPREHENSIVE METABOLIC PANEL
ALT: 10 IU/L (ref 0–32)
AST: 23 IU/L (ref 0–40)
Albumin/Globulin Ratio: 1.5 (ref 1.2–2.2)
Albumin: 4.5 g/dL (ref 3.6–4.6)
Alkaline Phosphatase: 84 IU/L (ref 48–121)
BUN/Creatinine Ratio: 15 (ref 12–28)
BUN: 16 mg/dL (ref 8–27)
Bilirubin Total: 0.5 mg/dL (ref 0.0–1.2)
CO2: 26 mmol/L (ref 20–29)
Calcium: 10.1 mg/dL (ref 8.7–10.3)
Chloride: 103 mmol/L (ref 96–106)
Creatinine, Ser: 1.07 mg/dL — ABNORMAL HIGH (ref 0.57–1.00)
GFR calc Af Amer: 56 mL/min/{1.73_m2} — ABNORMAL LOW (ref >59)
GFR calc non Af Amer: 48 mL/min/{1.73_m2} — ABNORMAL LOW (ref >59)
Globulin, Total: 3 g/dL (ref 1.5–4.5)
Glucose: 104 mg/dL — ABNORMAL HIGH (ref 65–99)
Potassium: 5.3 mmol/L — ABNORMAL HIGH (ref 3.5–5.2)
Sodium: 142 mmol/L (ref 134–144)
Total Protein: 7.5 g/dL (ref 6.0–8.5)

## 2020-07-08 LAB — CBC
Hematocrit: 35.1 % (ref 34.0–46.6)
Hemoglobin: 11.1 g/dL (ref 11.1–15.9)
MCH: 27.7 pg (ref 26.6–33.0)
MCHC: 31.6 g/dL (ref 31.5–35.7)
MCV: 88 fL (ref 79–97)
Platelets: 243 10*3/uL (ref 150–450)
RBC: 4.01 x10E6/uL (ref 3.77–5.28)
RDW: 13.3 % (ref 11.7–15.4)
WBC: 6.4 10*3/uL (ref 3.4–10.8)

## 2020-07-08 NOTE — Telephone Encounter (Signed)
PC placed to pt this evening to discuss lab results.  Pt informed that her kidney function is not 100% but stable. Blood count is in the low nl range.  Advise to take the OTC iron supplement every other day.  Cholesterol level nl.  Potassium level slightly elevated.  Advised that high K+ level can lead to abnormal heart rhythms.  Advised to cut bak on K+ rich foods like bananas, oranges, OJ, avocados, and salt substitutes that may contain KCL.  Pt states she eats a lot of bananas and loves OJ.  She uses a salt substitute.  Told her to look at label or have daughter look at salt substitute label to see if it is potassium salt.  Also recommend that she return to lab in 1 wk for repeat K+ level.  Does not need appt to come to lab.  She expressed understanding and thanked me for the call.

## 2020-07-10 NOTE — Telephone Encounter (Signed)
Pt last saw Dr Mayford Knife on 03/20/20 telemedicine Covid-19, last labs 07/07/20 Creat 1.07, age 84, weight 85.4kg, CrCl 54.65, based on CrCl pt is on appropriate dosage of Xarelto 20mg  QD.  Will refill rx.

## 2020-07-14 ENCOUNTER — Other Ambulatory Visit: Payer: Self-pay

## 2020-07-14 ENCOUNTER — Ambulatory Visit: Payer: Medicare Other | Attending: Internal Medicine

## 2020-07-14 DIAGNOSIS — E875 Hyperkalemia: Secondary | ICD-10-CM

## 2020-07-15 LAB — POTASSIUM: Potassium: 5 mmol/L (ref 3.5–5.2)

## 2020-07-18 ENCOUNTER — Telehealth: Payer: Self-pay

## 2020-07-18 NOTE — Telephone Encounter (Signed)
Contacted pt to go over lab results pt didn't answer and was unable to lvm  

## 2020-07-30 DIAGNOSIS — M179 Osteoarthritis of knee, unspecified: Secondary | ICD-10-CM | POA: Diagnosis not present

## 2020-08-30 DIAGNOSIS — M179 Osteoarthritis of knee, unspecified: Secondary | ICD-10-CM | POA: Diagnosis not present

## 2020-09-29 DIAGNOSIS — M179 Osteoarthritis of knee, unspecified: Secondary | ICD-10-CM | POA: Diagnosis not present

## 2020-10-30 DIAGNOSIS — M179 Osteoarthritis of knee, unspecified: Secondary | ICD-10-CM | POA: Diagnosis not present

## 2020-11-01 ENCOUNTER — Other Ambulatory Visit: Payer: Self-pay | Admitting: Internal Medicine

## 2020-11-01 DIAGNOSIS — M170 Bilateral primary osteoarthritis of knee: Secondary | ICD-10-CM

## 2020-11-06 ENCOUNTER — Other Ambulatory Visit: Payer: Self-pay

## 2020-11-06 ENCOUNTER — Ambulatory Visit: Payer: Medicare Other | Attending: Internal Medicine | Admitting: Internal Medicine

## 2020-11-06 DIAGNOSIS — I4892 Unspecified atrial flutter: Secondary | ICD-10-CM

## 2020-11-06 DIAGNOSIS — N183 Chronic kidney disease, stage 3 unspecified: Secondary | ICD-10-CM

## 2020-11-06 DIAGNOSIS — Z7901 Long term (current) use of anticoagulants: Secondary | ICD-10-CM | POA: Diagnosis not present

## 2020-11-06 DIAGNOSIS — M170 Bilateral primary osteoarthritis of knee: Secondary | ICD-10-CM | POA: Diagnosis not present

## 2020-11-06 DIAGNOSIS — I10 Essential (primary) hypertension: Secondary | ICD-10-CM

## 2020-11-06 DIAGNOSIS — R7303 Prediabetes: Secondary | ICD-10-CM | POA: Diagnosis not present

## 2020-11-06 NOTE — Progress Notes (Signed)
Pt states her pain is coming from the back of her legs

## 2020-11-06 NOTE — Progress Notes (Addendum)
Virtual Visit via Telephone Note  I connected with Anna [name] on 11/06/20 at 5:07 p.m by telephone and verified that I am speaking with the correct person using two identifiers.  Location: Patient: home Provider: office  The patient, my CMA Ms. Julius Bowels and myself participated in this encounter. I discussed the limitations, risks, security and privacy concerns of performing an evaluation and management service by telephone and the availability of in person appointments. I also discussed with the patient that there may be a patient responsible charge related to this service. The patient expressed understanding and agreed to proceed.   History of Present Illness: history of atrial flutter on Xarelto with history of cardioversion 2015, HTN,trivialAR, mild MR,OA knees, HL, prediabetes, CKD 3, ACD. Last seen 06/2020.  OA: feels her arthritis has gotten worse.  Pain behind knees up to thighs. No swelling in legs. Uses her knee support off and on.  Voltaren gel and Gabapentin still helps No falls.  She ambulates with her cane.  She does not want wheelchair  A.flutter: Denies any bruising or bleeding on Xarelto.  No palpitations.  HYPERTENSION Currently taking: see medication list Med Adherence: [x] ? Yes    [] ? No Medication side effects: [] ? Yes    [] ? No Adherence with salt restriction: [x] ? Yes    [] ? No Home Monitoring?: [x] ? Yes about once a wk   Monitoring Frequency: [] ? Yes    [] ? No Home BP results range:  Reports SBP 140-150/60-70 SOB? [] ? Yes    [x] ? No Chest Pain?: [] ? Yes    [x] ? No Leg swelling?: [] ? Yes    [x] ? No Headaches?: [] ? Yes    [x] ? No Dizziness? [] ? Yes    [x] ? No Comments:   HL: compliant with Crestor.  No cramps or muscle pain  PreDM:  She started taking full pill of Metformin because she was having some blurred vision and though she did better on the full tab  HM:  Did not get COVID booster as yet but plans to Outpatient Encounter Medications as of  11/06/2020  Medication Sig  . amlodipine-olmesartan (AZOR) 10-20 MG tablet Take 1 tablet by mouth daily.  . diclofenac Sodium (VOLTAREN) 1 % GEL APPLY 2 GRAMS TO AFFECTED AREA 4 TIMES A DAY  . docusate sodium (COLACE) 100 MG capsule Take 100 mg by mouth daily.   . ferrous sulfate 325 (65 FE) MG tablet Take 325 mg by mouth daily with breakfast.  . gabapentin (NEURONTIN) 100 MG capsule Take 1 capsule (100 mg total) by mouth 3 (three) times daily.  . metFORMIN (GLUCOPHAGE) 500 MG tablet Take 0.5 tablets (250 mg total) by mouth daily with breakfast. TAKE 1 TABLET BY MOUTH EVERY DAY WITH BREAKFAST  . Multiple Vitamins-Minerals (CENTRUM SILVER PO) Take 1 tablet by mouth daily.   . Omega-3 Fatty Acids (FISH OIL CONCENTRATE PO) Take 1 capsule by mouth daily.   . rosuvastatin (CRESTOR) 10 MG tablet TAKE 1 TABLET BY MOUTH EVERY DAY  . XARELTO 20 MG TABS tablet TAKE 1 TABLET BY MOUTH EVERY DAY WITH SUPPER   No facility-administered encounter medications on file as of 11/06/2020.      Observations/Objective: Results for orders placed or performed in visit on 07/14/20  Potassium  Result Value Ref Range   Potassium 5.0 3.5 - 5.2 mmol/L     Chemistry      Component Value Date/Time   NA 142 07/07/2020 1421   K 5.0 07/14/2020 1347   CL 103 07/07/2020 1421  CO2 26 07/07/2020 1421   BUN 16 07/07/2020 1421   CREATININE 1.07 (H) 07/07/2020 1421   CREATININE 1.04 (H) 06/19/2016 1503      Component Value Date/Time   CALCIUM 10.1 07/07/2020 1421   ALKPHOS 84 07/07/2020 1421   AST 23 07/07/2020 1421   ALT 10 07/07/2020 1421   BILITOT 0.5 07/07/2020 1421     Lab Results  Component Value Date   CHOL 108 07/07/2020   HDL 47 07/07/2020   LDLCALC 47 07/07/2020   TRIG 67 07/07/2020   CHOLHDL 2.3 07/07/2020   Lab Results  Component Value Date   WBC 6.4 07/07/2020   HGB 11.1 07/07/2020   HCT 35.1 07/07/2020   MCV 88 07/07/2020   PLT 243 07/07/2020     Assessment and Plan: 1. Essential  hypertension Systolic blood pressure acceptable given her age.  Continue Azor.  2. Primary osteoarthritis of both knees Patient states she is fighting not wanting to be in a wheelchair.  Still not interested in home health aide.  She has not had any falls.  Declines stronger pain medication.  She will continue with Voltaren gel and gabapentin.    3. Stage 3 chronic kidney disease, unspecified whether stage 3a or 3b CKD (HCC) We will continue to monitor.  I request that she come to the lab to have blood test done declines stating she relies on others for transportation now and is just too difficult and cold  4. Atrial flutter, unspecified type (HCC) Continue Xarelto.  Monitor for any bruising or bleeding.  5. Chronic anticoagulation See #4 above.  6. Prediabetes Advised that she goes back to taking Metformin half a tablet daily as prescribed given her kidney function.  Advised that she gets an eye exam if she is having any blurred vision.   Follow Up Instructions: 4 mths   I discussed the assessment and treatment plan with the patient. The patient was provided an opportunity to ask questions and all were answered. The patient agreed with the plan and demonstrated an understanding of the instructions.   The patient was advised to call back or seek an in-person evaluation if the symptoms worsen or if the condition fails to improve as anticipated.  I provided 10 minutes of non-face-to-face time during this encounter.   Jonah Blue, MD

## 2020-11-10 ENCOUNTER — Telehealth: Payer: Self-pay | Admitting: Internal Medicine

## 2020-11-10 NOTE — Telephone Encounter (Signed)
Called patient and LVM to schedule a 4 month follow up visit with Dr. Laural Benes (around 03/06/2021). Advised patient to call back [phone] to schedule.

## 2020-11-15 ENCOUNTER — Other Ambulatory Visit: Payer: Self-pay | Admitting: Cardiology

## 2020-11-30 DIAGNOSIS — M179 Osteoarthritis of knee, unspecified: Secondary | ICD-10-CM | POA: Diagnosis not present

## 2020-12-28 DIAGNOSIS — M179 Osteoarthritis of knee, unspecified: Secondary | ICD-10-CM | POA: Diagnosis not present

## 2021-01-28 DIAGNOSIS — M179 Osteoarthritis of knee, unspecified: Secondary | ICD-10-CM | POA: Diagnosis not present

## 2021-02-20 ENCOUNTER — Telehealth: Payer: Self-pay | Admitting: Internal Medicine

## 2021-02-20 ENCOUNTER — Telehealth: Payer: Self-pay

## 2021-02-20 NOTE — Telephone Encounter (Signed)
Returned pt granddaughter and made aware that these issues can be addressed at her upcoming visit and I will have her sign a new DPR when she comes in. Pt granddaughter states she understands and she will be with pt at her upcoming appt

## 2021-02-20 NOTE — Telephone Encounter (Signed)
Copied from CRM [id number]. Topic: General - Other >> Feb 20, 2021 12:10 PM Pawlus, [name] wrote: Reason for CRM: Caller asked if an order can be placed for a hospital bed, also needed an order for [facility]. Caller stated her mother needs a home health aid and some supplies. >> Feb 20, 2021 12:21 PM Jaquita Rector A wrote: Anna [name] Granddaughter Ph# [phone] [facility] for Hospital bed Fax# [phone]

## 2021-02-20 NOTE — Telephone Encounter (Signed)
Copied from CRM [id number]. Topic: General - Other >> Feb 20, 2021 12:10 PM Pawlus, [name] wrote: Reason for CRM: Caller asked if an order can be placed for a hospital bed, also needed an order for [facility]. Caller stated her mother needs a home health aid and some supplies.

## 2021-02-20 NOTE — Telephone Encounter (Signed)
Returned pt granddaughter and made aware that these issues can be addressed at her upcoming visit and I will have her sign a new DPR when she comes in. Pt granddaughter states she understands and she will be with pt at her upcoming appt  

## 2021-02-27 ENCOUNTER — Other Ambulatory Visit: Payer: Self-pay | Admitting: Cardiology

## 2021-02-27 DIAGNOSIS — M179 Osteoarthritis of knee, unspecified: Secondary | ICD-10-CM | POA: Diagnosis not present

## 2021-02-27 DIAGNOSIS — I4892 Unspecified atrial flutter: Secondary | ICD-10-CM

## 2021-02-27 NOTE — Telephone Encounter (Signed)
Xarelto 20mg  refill request received. Pt is 85 years old, weight-85.4kg, Crea-1.07 on 07/07/2020, last seen by Dr. 09/06/2020 on 03/20/2020, Diagnosis-Afib, CrCl-53.57ml/min; Dose is appropriate based on dosing criteria. Will send in refill to requested pharmacy.

## 2021-03-01 DIAGNOSIS — I129 Hypertensive chronic kidney disease with stage 1 through stage 4 chronic kidney disease, or unspecified chronic kidney disease: Secondary | ICD-10-CM | POA: Diagnosis not present

## 2021-03-01 DIAGNOSIS — E119 Type 2 diabetes mellitus without complications: Secondary | ICD-10-CM | POA: Diagnosis not present

## 2021-03-01 DIAGNOSIS — N189 Chronic kidney disease, unspecified: Secondary | ICD-10-CM | POA: Diagnosis not present

## 2021-03-01 DIAGNOSIS — N1831 Chronic kidney disease, stage 3a: Secondary | ICD-10-CM | POA: Diagnosis not present

## 2021-03-01 DIAGNOSIS — E1122 Type 2 diabetes mellitus with diabetic chronic kidney disease: Secondary | ICD-10-CM | POA: Diagnosis not present

## 2021-03-01 DIAGNOSIS — D631 Anemia in chronic kidney disease: Secondary | ICD-10-CM | POA: Diagnosis not present

## 2021-03-01 DIAGNOSIS — I4891 Unspecified atrial fibrillation: Secondary | ICD-10-CM | POA: Diagnosis not present

## 2021-03-07 ENCOUNTER — Encounter: Payer: Self-pay | Admitting: Internal Medicine

## 2021-03-07 NOTE — Progress Notes (Signed)
Patient seen by Dr. Arrie Aran on 03/01/2021. Labs: UA negative for blood and protein BUN 22/creatinine 1.07/GFR 52 LFT is normal A1c 6.4 H/H11.1/33.5, MCV 85 Iron 78, iron sat 21%, iron binding capacity 376, ferritin 573 CKD stage III AA with no proteinuria or hematuria.  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  Plan was to do a kidney ultrasound.  If renal function remains stable she will not need to follow-up with nephrology.

## 2021-03-15 ENCOUNTER — Other Ambulatory Visit: Payer: Self-pay | Admitting: Cardiology

## 2021-03-15 ENCOUNTER — Other Ambulatory Visit: Payer: Self-pay

## 2021-03-15 ENCOUNTER — Encounter (HOSPITAL_BASED_OUTPATIENT_CLINIC_OR_DEPARTMENT_OTHER): Payer: Self-pay

## 2021-03-15 ENCOUNTER — Other Ambulatory Visit: Payer: Self-pay | Admitting: Internal Medicine

## 2021-03-15 ENCOUNTER — Ambulatory Visit: Payer: Medicare Other | Admitting: Internal Medicine

## 2021-03-15 ENCOUNTER — Emergency Department (HOSPITAL_BASED_OUTPATIENT_CLINIC_OR_DEPARTMENT_OTHER): Payer: Medicare Other | Admitting: Radiology

## 2021-03-15 ENCOUNTER — Emergency Department (HOSPITAL_BASED_OUTPATIENT_CLINIC_OR_DEPARTMENT_OTHER)
Admission: EM | Admit: 2021-03-15 | Discharge: 2021-03-15 | Disposition: A | Discharge status: Home / Self Care | Payer: Medicare Other | Attending: Emergency Medicine | Admitting: Emergency Medicine

## 2021-03-15 ENCOUNTER — Telehealth: Payer: Self-pay

## 2021-03-15 DIAGNOSIS — N183 Chronic kidney disease, stage 3 unspecified: Secondary | ICD-10-CM | POA: Diagnosis not present

## 2021-03-15 DIAGNOSIS — M5441 Lumbago with sciatica, right side: Secondary | ICD-10-CM

## 2021-03-15 DIAGNOSIS — W050XXA Fall from non-moving wheelchair, initial encounter: Secondary | ICD-10-CM | POA: Insufficient documentation

## 2021-03-15 DIAGNOSIS — Y92009 Unspecified place in unspecified non-institutional (private) residence as the place of occurrence of the external cause: Secondary | ICD-10-CM | POA: Insufficient documentation

## 2021-03-15 DIAGNOSIS — M170 Bilateral primary osteoarthritis of knee: Secondary | ICD-10-CM

## 2021-03-15 DIAGNOSIS — R7303 Prediabetes: Secondary | ICD-10-CM | POA: Insufficient documentation

## 2021-03-15 DIAGNOSIS — I10 Essential (primary) hypertension: Secondary | ICD-10-CM

## 2021-03-15 DIAGNOSIS — M5442 Lumbago with sciatica, left side: Secondary | ICD-10-CM

## 2021-03-15 DIAGNOSIS — Z743 Need for continuous supervision: Secondary | ICD-10-CM | POA: Diagnosis not present

## 2021-03-15 DIAGNOSIS — I129 Hypertensive chronic kidney disease with stage 1 through stage 4 chronic kidney disease, or unspecified chronic kidney disease: Secondary | ICD-10-CM | POA: Diagnosis not present

## 2021-03-15 DIAGNOSIS — Z79899 Other long term (current) drug therapy: Secondary | ICD-10-CM | POA: Insufficient documentation

## 2021-03-15 DIAGNOSIS — R6889 Other general symptoms and signs: Secondary | ICD-10-CM | POA: Diagnosis not present

## 2021-03-15 DIAGNOSIS — W19XXXA Unspecified fall, initial encounter: Secondary | ICD-10-CM | POA: Diagnosis not present

## 2021-03-15 DIAGNOSIS — Z7984 Long term (current) use of oral hypoglycemic drugs: Secondary | ICD-10-CM | POA: Diagnosis not present

## 2021-03-15 DIAGNOSIS — M545 Low back pain, unspecified: Secondary | ICD-10-CM | POA: Diagnosis not present

## 2021-03-15 DIAGNOSIS — M549 Dorsalgia, unspecified: Secondary | ICD-10-CM | POA: Diagnosis not present

## 2021-03-15 DIAGNOSIS — I4892 Unspecified atrial flutter: Secondary | ICD-10-CM

## 2021-03-15 MED ORDER — DEXAMETHASONE SODIUM PHOSPHATE 10 MG/ML IJ SOLN
10.0000 mg | Freq: Once | INTRAMUSCULAR | Status: AC
  Administered 2021-03-15: 10 mg via INTRAMUSCULAR
  Filled 2021-03-15: qty 1

## 2021-03-15 MED ORDER — DIAZEPAM 2 MG PO TABS
2.0000 mg | ORAL_TABLET | Freq: Once | ORAL | Status: AC
  Administered 2021-03-15: 2 mg via ORAL
  Filled 2021-03-15: qty 1

## 2021-03-15 MED ORDER — CYCLOBENZAPRINE HCL 10 MG PO TABS: 5.0000 mg | ORAL_TABLET | Freq: Two times a day (BID) | ORAL | 0 refills | Status: DC | PRN

## 2021-03-15 MED ORDER — METHYLPREDNISOLONE 4 MG PO TBPK: ORAL_TABLET | ORAL | 0 refills | Status: DC

## 2021-03-15 NOTE — ED Triage Notes (Signed)
Patient BIB [facility] from Home for Back Pain.  Patient fell 2 weeks PTA and was assisted by Son-In-Law to stand.  Patient did not seek medical assistance due to no residual effects from Fall but 3 days PTA the patient began having pain to Mid Lower Back that radiates to Bilateral Legs with Movement.  Ambulatory with Cane/Walker normally. GCS 15. NAD when at rest.

## 2021-03-15 NOTE — Discharge Instructions (Addendum)
Follow-up with your primary care doctor as you may need physical therapy or possibly referral to a spine specialist.  Overall your x-rays today were unremarkable.  There was may be an age-indeterminate compression fracture at T12 but this is slightly higher than where your pain is and this is likely old.  Overall suspect that you have muscle spasms.  Take Medrol Dosepak as prescribed.  Take Flexeril as prescribed as well.  Flexeril is a muscle relaxant and it can make you sleepy so be careful while taking it and make sure you are always using your walker or wheelchair while ambulating.  Recommend that you consider buying lidocaine patches over-the-counter as well.  Please continue your Voltaren gel.

## 2021-03-15 NOTE — Telephone Encounter (Signed)
Pt last saw Dr Mayford Knife 03/20/20, pt is due for follow-up.  Last labs 07/07/20 Creat 1.07, age 85, weight 86.2kg, CrCl 54.21, based on CrCl pt is on appropriate dosage of Xarelto 20mg  QD.  Will refill rx x 1 with note to contact office to schedule f/u.

## 2021-03-15 NOTE — Telephone Encounter (Addendum)
Met with the patient and her granddaughter, [name], when they came to the clinic today.  [name] had multiple requests for the patient:  Hospital bed, home health services and [facility]. Explained that the patient will need to be seen by a provider prior to ordering the services and an appointment was scheduled for her 03/19/2021 @ [facility]. The order for the bed can be placed at that time.  She requested the order for the bed be sent to [place]. Provided her with the phone number for [facility] to check on the status of the bed after order is placed. Explained to her that side rails are not part of the order and she will need to discuss with [facility].   Mom's meals. - Explained to them that there is no guarantee that she will be approved but the order can be submitted. The patient is having more difficulty with mobility and ADLs  since her fall  Order sent to [employer] for review.   [name] asked about being paid to care for her grandmother  This CM explained that she is referring to  [facility] services.  Provided her with the phone number to call and inquire about the wait list for services.

## 2021-03-15 NOTE — ED Provider Notes (Signed)
[facility] EMERGENCY DEPT Provider Note   CSN: [id number] Arrival date & time: 03/15/21  0038     History Chief Complaint  Patient presents with  . Fall    Anna [name] is a 85 y.o. female.  Patient had fall 2 weeks ago.  Was overall feeling general soreness in her lower back but has gotten worse over the last 2 to 3 days.  She uses a walker and a wheelchair at home.  She is having worsening pain tonight that is making it hard for her to sleep.  She is having shooting pain down both of her legs at times.  When she is still she feels comfortable and has no pain.  She has a lot of pain when she flexes at both of her hips.  She denies any loss of bowel or bladder.  NoNumbness in between her legs, no urinary retention.  The history is provided by the patient.  Back Pain Location:  Lumbar spine Quality:  Aching Radiates to: Right and left leg. Pain severity:  Mild Onset quality:  Gradual Timing:  Intermittent Progression:  Waxing and waning Chronicity:  New Relieved by:  NSAIDs Worsened by:  Ambulation Associated symptoms: no abdominal pain, no abdominal swelling, no bladder incontinence, no bowel incontinence, no chest pain, no dysuria, no fever, no headaches, no leg pain, no numbness, no paresthesias, no pelvic pain, no perianal numbness, no tingling and no weakness        Past Medical History:  Diagnosis Date  . Hyperlipidemia   . Hypertension   . Osteoarthritis   . Overweight(278.02)   . Permanent atrial fibrillation [facility])     Patient Active Problem List   Diagnosis Date Noted  . Anemia due to stage 3a chronic kidney disease (HCC) 03/07/2020  . Stage 3a chronic kidney disease (HCC) 03/07/2020  . Mixed incontinence urge and stress 03/09/2018  . Prediabetes 03/09/2018  . OA (osteoarthritis) of knee 03/09/2018  . Sinus bradycardia 12/18/2016  . Estrogen deficiency 07/03/2016  . Mitral regurgitation 03/01/2014  . Atrial flutter (HCC) 03/01/2014  .  Permanent atrial fibrillation (HCC) 12/24/2013  . Essential hypertension, benign 12/24/2013  . Heart murmur 12/24/2013  . Hyperlipemia 12/24/2013    Past Surgical History:  Procedure Laterality Date  . CARDIOVERSION N/A 03/24/2014   Procedure: CARDIOVERSION;  Surgeon: Quintella Reichert, MD;  Location: [facility] ENDOS[facility];  Service: Cardiovascular;  Laterality: N/A;     OB History   No obstetric history on file.     Family History  Problem Relation Age of Onset  . Alzheimer's disease Mother     Social History   Tobacco Use  . Smoking status: Never Smoker  . Smokeless tobacco: Never Used  Substance Use Topics  . Alcohol use: No  . Drug use: No    Home Medications Prior to Admission medications   Medication Sig Start Date End Date Taking? Authorizing Provider  cyclobenzaprine (FLEXERIL) 10 MG tablet Take 0.5 tablets (5 mg total) by mouth 2 (two) times daily as needed for muscle spasms. 03/15/21  Yes Yahayra Geis, DO  methylPREDNISolone (MEDROL DOSEPAK) 4 MG TBPK tablet Follow package insert 03/15/21  Yes Marjorie Deprey, DO  amlodipine-olmesartan (AZOR) 10-20 MG tablet Take 1 tablet by mouth daily. 03/07/20   Marcine Matar, MD  diclofenac Sodium (VOLTAREN) 1 % GEL APPLY 2 GRAMS TO AFFECTED AREA 4 TIMES A DAY 11/01/20   Marcine Matar, MD  docusate sodium (COLACE) 100 MG capsule Take 100 mg by mouth  daily.     [provider]  ferrous sulfate 325 (65 FE) MG tablet Take 325 mg by mouth daily with breakfast.    [provider]  gabapentin (NEURONTIN) 100 MG capsule Take 1 capsule (100 mg total) by mouth 3 (three) times daily. 03/07/20   Marcine Matar, MD  metFORMIN (GLUCOPHAGE) 500 MG tablet Take 0.5 tablets (250 mg total) by mouth daily with breakfast. TAKE 1 TABLET BY MOUTH EVERY DAY WITH BREAKFAST 07/07/20   Marcine Matar, MD  Multiple Vitamins-Minerals (CENTRUM SILVER PO) Take 1 tablet by mouth daily.     [provider]  Omega-3 Fatty Acids  (FISH OIL CONCENTRATE PO) Take 1 capsule by mouth daily.     [provider]  rivaroxaban (XARELTO) 20 MG TABS tablet Take 1 tablet (20 mg total) by mouth daily with supper. Please call the office to schedule appointment. Thanks 02/27/21   Quintella Reichert, MD  rosuvastatin (CRESTOR) 10 MG tablet TAKE 1 TABLET BY MOUTH EVERY DAY 11/15/20   Quintella Reichert, MD    Allergies    Patient has no known allergies.  Review of Systems   Review of Systems  Constitutional: Negative for chills and fever.  HENT: Negative for ear pain and sore throat.   Eyes: Negative for pain and visual disturbance.  Respiratory: Negative for cough and shortness of breath.   Cardiovascular: Negative for chest pain and palpitations.  Gastrointestinal: Negative for abdominal pain, bowel incontinence and vomiting.  Genitourinary: Negative for bladder incontinence, dysuria, hematuria and pelvic pain.  Musculoskeletal: Positive for back pain. Negative for arthralgias.  Skin: Negative for color change and rash.  Neurological: Negative for dizziness, tingling, tremors, seizures, syncope, facial asymmetry, speech difficulty, weakness, light-headedness, numbness, headaches and paresthesias.  All other systems reviewed and are negative.   Physical Exam Updated Vital Signs BP (!) 162/80 (BP Location: Left Arm)   Pulse 71   Temp 98.1 F (36.7 C) (Oral)   Resp 16   Ht 5\' 4"  (1.626 m)   Wt 86.2 kg   SpO2 98%   BMI 32.61 kg/m   Physical Exam Vitals and nursing note reviewed.  Constitutional:      General: She is not in acute distress.    Appearance: She is well-developed. She is not ill-appearing.  HENT:     Head: Normocephalic and atraumatic.     Nose: Nose normal.     Mouth/Throat:     Mouth: Mucous membranes are moist.  Eyes:     Extraocular Movements: Extraocular movements intact.     Conjunctiva/sclera: Conjunctivae normal.     Pupils: Pupils are equal, round, and reactive to light.  Cardiovascular:      Rate and Rhythm: Normal rate and regular rhythm.     Pulses: Normal pulses.     Heart sounds: Normal heart sounds. No murmur heard.   Pulmonary:     Effort: Pulmonary effort is normal. No respiratory distress.     Breath sounds: Normal breath sounds.  Abdominal:     Palpations: Abdomen is soft.     Tenderness: There is no abdominal tenderness.  Musculoskeletal:        General: Tenderness present. Normal range of motion.     Cervical back: Normal range of motion and neck supple.     Comments: No midline spinal tenderness, tenderness to paraspinal lumbar muscles with increased tone  Skin:    General: Skin is warm and dry.     Capillary Refill: Capillary  refill takes less than 2 seconds.  Neurological:     General: No focal deficit present.     Mental Status: She is alert and oriented to person, place, and time.     Cranial Nerves: No cranial nerve deficit.     Sensory: No sensory deficit.     Motor: No weakness.     Coordination: Coordination normal.     ED Results / Procedures / Treatments   Labs (all labs ordered are listed, but only abnormal results are displayed) Labs Reviewed - No data to display  EKG None  Radiology DG Lumbar Spine Complete  Result Date: 03/15/2021 CLINICAL DATA:  Back pain EXAM: LUMBAR SPINE - COMPLETE 4+ VIEW COMPARISON:  None. FINDINGS: Compression deformity of T12, age indeterminate. Right convex scoliosis. Multilevel mild degenerative disc disease. IMPRESSION: 1. Age-indeterminate T12 compression fracture. MRI may provide better temporal characterization. 2. Right convex scoliosis and mild multilevel degenerative disc disease of the lumbar spine. Electronically Signed   By: Deatra Robinson M.D.   On: 03/15/2021 02:13    Procedures Procedures   Medications Ordered in ED Medications  dexamethasone (DECADRON) injection 10 mg (10 mg Intramuscular Given 03/15/21 0148)  diazepam (VALIUM) tablet 2 mg (2 mg Oral Given 03/15/21 0148)    ED Course  I  have reviewed the triage vital signs and the nursing notes.  Pertinent labs & imaging results that were available during my care of the patient were reviewed by me and considered in my medical decision making (see chart for details).    MDM Rules/Calculators/A&P                          [name] is here with back pain after fall 2 weeks ago.  Patient with normal vitals.  No fever.  Does take Xarelto but otherwise no other concerning medications.  Mechanical fall at home 2 weeks ago.  Was not evaluated.  Lives at home by herself but has a walker and wheelchair.  She has been getting by fairly well but the last couple days she has been having more lower back pain with movement with radiating pain down both legs.  She denies any loss of bowel or bladder.  No saddle anesthesia.  No urinary retention.  When she is laying still she does not have any discomfort but tonight when she was trying to sleep she could not get comfortable.  She is neurologically intact on exam.  Normal strength and sensation.  She is tender mostly to the paraspinal lumbar muscles bilaterally.  No specific midline tenderness.  She has been using Voltaren gel with some relief.  Overall suspect muscle spasm/likely pinched nerve or sciatica.  Will give a dose of Valium and Decadron.  We will get an x-ray.  She has not had any other falls.  X-ray shows age-indeterminate T12 compression fracture.  Does not have any specific spinal tenderness in the spot as the pain is mostly lower than this.  Overall there is no major acute fracture in the lumbar region.  Felt better after Valium.  Will prescribe Medrol Dosepak and Flexeril and have her follow-up with her primary care doctor.  Discharged in good condition.  She will continue Voltaren gel as well as over-the-counter lidocaine patches.  This chart was dictated using voice recognition software.  Despite best efforts to proofread,  errors can occur which can change the documentation  meaning.     Final Clinical Impression(s) / ED  Diagnoses Final diagnoses:  Acute low back pain with bilateral sciatica, unspecified back pain laterality    Rx / DC Orders ED Discharge Orders         Ordered    methylPREDNISolone (MEDROL DOSEPAK) 4 MG TBPK tablet        03/15/21 0220    cyclobenzaprine (FLEXERIL) 10 MG tablet  2 times daily PRN        03/15/21 0220           Virgina Norfolk, DO 03/15/21 0222

## 2021-03-17 ENCOUNTER — Other Ambulatory Visit: Payer: Self-pay | Admitting: Internal Medicine

## 2021-03-17 DIAGNOSIS — M170 Bilateral primary osteoarthritis of knee: Secondary | ICD-10-CM

## 2021-03-18 NOTE — Progress Notes (Addendum)
Patient ID: Anna [name], female    DOB: 05-25-1936  MRN: [id number]  CC: Hospital Discharge Follow-Up  Subjective: Anna [name] is a 85 y.o. female who presents for hospital discharge follow-up.   Her concerns today include:   1. HOSPITAL DISCHARGE FOLLOW-UP: Visit 03/15/2021 at [facility] per DO note: Anna [name] is here with back pain after fall 2 weeks ago.  Patient with normal vitals.  No fever.  Does take Xarelto but otherwise no other concerning medications.  Mechanical fall at home 2 weeks ago.  Was not evaluated.  Lives at home by herself but has a walker and wheelchair.  She has been getting by fairly well but the last couple days she has been having more lower back pain with movement with radiating pain down both legs.  She denies any loss of bowel or bladder.  No saddle anesthesia.  No urinary retention.  When she is laying still she does not have any discomfort but tonight when she was trying to sleep she could not get comfortable.  She is neurologically intact on exam.  Normal strength and sensation.  She is tender mostly to the paraspinal lumbar muscles bilaterally.  No specific midline tenderness.  She has been using Voltaren gel with some relief.  Overall suspect muscle spasm/likely pinched nerve or sciatica.  Will give a dose of Valium and Decadron.  We will get an x-ray.  She has not had any other falls.   X-ray shows age-indeterminate T12 compression fracture.  Does not have any specific spinal tenderness in the spot as the pain is mostly lower than this.  Overall there is no major acute fracture in the lumbar region.  Felt better after Valium.  Will prescribe Medrol Dosepak and Flexeril and have her follow-up with her primary care doctor.  Discharged in good condition.  She will continue Voltaren gel as well as over-the-counter lidocaine patches.  03/19/2021: Today patient is still having low back and bilateral legs pain. Stating "My  legs don't work." Nighttime pain prevents adequate rest. Still using Flexeril, Voltaren gel, and over-the-counter lidocaine patches. Has about 2 days left of Prednisone. Denies any falls since hospital discharge.  She is accompanied by her granddaughter Anna [name].   Since hospital discharge mostly using wheelchair. Granddaughter reports patient does use the walker to keep mobile. Requesting a hospital bed. Reports that the legs of the bed were cut down to assist with patient being able to get in and out of bed better. However, reports now the bed is too low and the patient has to fall down to the bed instead of sitting as she normally would on a bed.  Granddaughter declines referral to Orthopedics. She is requesting a referral to Pain Management, Physical Therapy, a front-wheel rolling walker, and an electronic wheelchair.   2. HYPERTENSION AND LOW PULSE: Initially patient endorses to the provider that she did take her blood pressure medication today. Subsequently Granddaughter told provider that patient says she forgot to take blood pressure medication.   Patient does not monitor blood pressure at home.    Granddaughter with concern if any medications would be causing patient to have a low pulse rate.   Patient Active Problem List   Diagnosis Date Noted   Anemia due to stage 3a chronic kidney disease (HCC) 03/07/2020   Stage 3a chronic kidney disease (HCC) 03/07/2020   Mixed incontinence urge and stress 03/09/2018   Prediabetes 03/09/2018   OA (osteoarthritis) of  knee 03/09/2018   Sinus bradycardia 12/18/2016   Estrogen deficiency 07/03/2016   Mitral regurgitation 03/01/2014   Atrial flutter (HCC) 03/01/2014   Permanent atrial fibrillation (HCC) 12/24/2013   Essential hypertension, benign 12/24/2013   Heart murmur 12/24/2013   Hyperlipemia 12/24/2013     Current Outpatient Medications on File Prior to Visit  Medication Sig Dispense Refill   amlodipine-olmesartan (AZOR) 10-20 MG  tablet TAKE 1 TABLET BY MOUTH EVERY DAY 90 tablet 0   cyclobenzaprine (FLEXERIL) 10 MG tablet Take 0.5 tablets (5 mg total) by mouth 2 (two) times daily as needed for muscle spasms. 20 tablet 0   diclofenac Sodium (VOLTAREN) 1 % GEL APPLY 2 GRAMS TO AFFECTED AREA 4 TIMES A DAY 400 g 0   docusate sodium (COLACE) 100 MG capsule Take 100 mg by mouth daily.      ferrous sulfate 325 (65 FE) MG tablet Take 325 mg by mouth daily with breakfast.     gabapentin (NEURONTIN) 100 MG capsule TAKE 1 CAPSULE BY MOUTH THREE TIMES A DAY 270 capsule 0   metFORMIN (GLUCOPHAGE) 500 MG tablet TAKE 1 TABLET BY MOUTH EVERY DAY WITH BREAKFAST 90 tablet 0   methylPREDNISolone (MEDROL DOSEPAK) 4 MG TBPK tablet Follow package insert 21 each 0   Multiple Vitamins-Minerals (CENTRUM SILVER PO) Take 1 tablet by mouth daily.      Omega-3 Fatty Acids (FISH OIL CONCENTRATE PO) Take 1 capsule by mouth daily.      rivaroxaban (XARELTO) 20 MG TABS tablet Take 1 tablet (20 mg total) by mouth daily with supper. Due for follow-up, MUST schedule follow-up MD appt for FUTURE refills. 90 tablet 0   rosuvastatin (CRESTOR) 10 MG tablet TAKE 1 TABLET BY MOUTH EVERY DAY 90 tablet 3   No current facility-administered medications on file prior to visit.    No Known Allergies  Social History   Socioeconomic History   Marital status: Divorced    Spouse name: Not on file   Number of children: Not on file   Years of education: Not on file   Highest education level: Not on file  Occupational History   Not on file  Tobacco Use   Smoking status: Never Smoker   Smokeless tobacco: Never Used  Substance and Sexual Activity   Alcohol use: No   Drug use: No   Sexual activity: Not on file  Other Topics Concern   Not on file  Social History Narrative   Not on file   Social Determinants of Health   Financial Resource Strain: Not on file  Food Insecurity: Not on file  Transportation Needs: Not on file  Physical Activity: Not on file   Stress: Not on file  Social Connections: Not on file  Intimate Partner Violence: Not on file    Family History  Problem Relation Age of Onset   Alzheimer's disease Mother     Past Surgical History:  Procedure Laterality Date   CARDIOVERSION N/A 03/24/2014   Procedure: CARDIOVERSION;  Surgeon: Quintella Reichert, MD;  Location: [facility];  Service: Cardiovascular;  Laterality: N/A;    ROS: Review of Systems Negative except as stated above  PHYSICAL EXAM: BP (!) 187/73 (BP Location: Left Arm, Patient Position: Sitting, Cuff Size: Normal)   Pulse (!) 44   Temp 98.4 F (36.9 C)   Resp 12   Ht 5' 2.99" (1.6 m)   Wt 179 lb 3.2 oz (81.3 kg)   SpO2 96%   BMI 31.75 kg/m   Physical  Exam HENT:     Head: Normocephalic and atraumatic.  Eyes:     Extraocular Movements: Extraocular movements intact.     Conjunctiva/sclera: Conjunctivae normal.     Pupils: Pupils are equal, round, and reactive to light.  Cardiovascular:     Rate and Rhythm: Bradycardia present. Rhythm regularly irregular.  Pulmonary:     Effort: Pulmonary effort is normal.     Breath sounds: Normal breath sounds.  Musculoskeletal:     Cervical back: Normal range of motion and neck supple.  Neurological:     General: No focal deficit present.     Mental Status: She is alert.  Psychiatric:        Mood and Affect: Mood normal.        Behavior: Behavior normal.     ASSESSMENT AND PLAN: 1. Hospital discharge follow-up: 2. Acute low back pain with bilateral sciatica, unspecified back pain laterality: - Patient still having low back and bilateral legs pain. Continue current regimen. - No falls since hospital discharge. Patient does live home alone.  - Per granddaughter request hospital bed, rolling walker, electric wheelchair, Physical Therapy referral, and Pain Clinic referral orders placed.  - Patient requires re-positioning of the body in ways that cannot be achieved with an ordinary bed or wedge pillow.  Patient has difficulty with and cannot change positions on her own with current bed at home.  - Keep appointment scheduled with primary provider on 04/24/2021. - For home use only DME Hospital bed - Ambulatory referral to Pain Clinic - Ambulatory referral to Physical Therapy - Walker rolling - DME Wheelchair electric  3. Essential hypertension: - Blood pressure not at goal during today's visit. Patient is mostly asymptomatic without chest pressure, chest pain, palpitations, shortness of breath, and worst headache of life. - Initially patient told provider that she did take her blood pressure medication today. Subsequently Granddaughter told provider that patient says she forgot to take blood pressure medication.  - Patient does not monitor blood pressure at home.   - Patient is followed by Cardiology with last visit being 03/20/2020 with plan for 1 year follow-up. Continued on Amlodipine/Olmesartan 10-20 mg daily.  4. Sinus bradycardia: - Patient with history of atrial flutter, mitral regurgitation, permanent atrial fibrillation, and sinus bradycardia. - Granddaughter reports patient had a cardioversion x 1 in the past. - Patient denies having pacemaker or internal cardioverter defibrillator.  - Concern for bradycardia. There are no potential offenders on medication list. - EMS called by provider's CMA, Margorie John.  - EMS confirmed upon arrival on their monitor that patient was in atrial fibrillation with new onset left bundle branch block.  Kathlene November, paramedic and Peyton Najjar, EMT were able to safely transfer patient from office. Patient's daughter Arline Asp and granddaughter Anna [name] were present during transfer of care.     Patient was given the opportunity to ask questions.  Patient verbalized understanding of the plan and was able to repeat key elements of the plan. Patient was given clear instructions to go to Emergency Department or return to medical center if symptoms don't improve,  worsen, or new problems develop.The patient verbalized understanding.   Orders Placed This Encounter  Procedures   For home use only DME Hospital bed   Walker rolling   Ambulatory referral to Pain Clinic   Ambulatory referral to Physical Therapy    Follow-up with primary provider as scheduled.   Rema Fendt, NP

## 2021-03-19 ENCOUNTER — Ambulatory Visit (INDEPENDENT_AMBULATORY_CARE_PROVIDER_SITE_OTHER): Payer: Medicare Other | Admitting: Family

## 2021-03-19 ENCOUNTER — Telehealth: Payer: Self-pay

## 2021-03-19 ENCOUNTER — Emergency Department (HOSPITAL_COMMUNITY): Payer: Medicare Other

## 2021-03-19 ENCOUNTER — Encounter: Payer: Self-pay | Admitting: Family

## 2021-03-19 ENCOUNTER — Telehealth: Payer: Self-pay | Admitting: Internal Medicine

## 2021-03-19 ENCOUNTER — Other Ambulatory Visit: Payer: Self-pay

## 2021-03-19 ENCOUNTER — Encounter (HOSPITAL_COMMUNITY): Payer: Self-pay | Admitting: Emergency Medicine

## 2021-03-19 ENCOUNTER — Emergency Department (HOSPITAL_COMMUNITY)
Admission: EM | Admit: 2021-03-19 | Discharge: 2021-03-19 | Disposition: A | Discharge status: Home / Self Care | Payer: Medicare Other | Attending: Emergency Medicine | Admitting: Emergency Medicine

## 2021-03-19 VITALS — BP 187/73 | HR 44 | Temp 98.4°F | Resp 12 | Ht 62.99 in | Wt 179.2 lb

## 2021-03-19 DIAGNOSIS — M5442 Lumbago with sciatica, left side: Secondary | ICD-10-CM

## 2021-03-19 DIAGNOSIS — D631 Anemia in chronic kidney disease: Secondary | ICD-10-CM | POA: Diagnosis not present

## 2021-03-19 DIAGNOSIS — Z09 Encounter for follow-up examination after completed treatment for conditions other than malignant neoplasm: Secondary | ICD-10-CM | POA: Diagnosis not present

## 2021-03-19 DIAGNOSIS — I4892 Unspecified atrial flutter: Secondary | ICD-10-CM | POA: Diagnosis not present

## 2021-03-19 DIAGNOSIS — R531 Weakness: Secondary | ICD-10-CM | POA: Diagnosis not present

## 2021-03-19 DIAGNOSIS — I129 Hypertensive chronic kidney disease with stage 1 through stage 4 chronic kidney disease, or unspecified chronic kidney disease: Secondary | ICD-10-CM | POA: Diagnosis not present

## 2021-03-19 DIAGNOSIS — R6889 Other general symptoms and signs: Secondary | ICD-10-CM | POA: Diagnosis not present

## 2021-03-19 DIAGNOSIS — R0902 Hypoxemia: Secondary | ICD-10-CM | POA: Diagnosis not present

## 2021-03-19 DIAGNOSIS — Z79899 Other long term (current) drug therapy: Secondary | ICD-10-CM | POA: Diagnosis not present

## 2021-03-19 DIAGNOSIS — N1831 Chronic kidney disease, stage 3a: Secondary | ICD-10-CM | POA: Insufficient documentation

## 2021-03-19 DIAGNOSIS — R001 Bradycardia, unspecified: Secondary | ICD-10-CM | POA: Insufficient documentation

## 2021-03-19 DIAGNOSIS — R059 Cough, unspecified: Secondary | ICD-10-CM | POA: Diagnosis not present

## 2021-03-19 DIAGNOSIS — I10 Essential (primary) hypertension: Secondary | ICD-10-CM | POA: Diagnosis not present

## 2021-03-19 DIAGNOSIS — I499 Cardiac arrhythmia, unspecified: Secondary | ICD-10-CM | POA: Diagnosis not present

## 2021-03-19 DIAGNOSIS — Z7984 Long term (current) use of oral hypoglycemic drugs: Secondary | ICD-10-CM | POA: Diagnosis not present

## 2021-03-19 DIAGNOSIS — E1122 Type 2 diabetes mellitus with diabetic chronic kidney disease: Secondary | ICD-10-CM | POA: Diagnosis not present

## 2021-03-19 DIAGNOSIS — R0602 Shortness of breath: Secondary | ICD-10-CM | POA: Diagnosis not present

## 2021-03-19 DIAGNOSIS — M5441 Lumbago with sciatica, right side: Secondary | ICD-10-CM

## 2021-03-19 DIAGNOSIS — R079 Chest pain, unspecified: Secondary | ICD-10-CM | POA: Diagnosis not present

## 2021-03-19 DIAGNOSIS — Z743 Need for continuous supervision: Secondary | ICD-10-CM | POA: Diagnosis not present

## 2021-03-19 DIAGNOSIS — Z7901 Long term (current) use of anticoagulants: Secondary | ICD-10-CM | POA: Insufficient documentation

## 2021-03-19 LAB — BASIC METABOLIC PANEL
Anion gap: 9 (ref 5–15)
BUN: 22 mg/dL (ref 8–23)
CO2: 25 mmol/L (ref 22–32)
Calcium: 9.9 mg/dL (ref 8.9–10.3)
Chloride: 103 mmol/L (ref 98–111)
Creatinine, Ser: 0.97 mg/dL (ref 0.44–1.00)
GFR, Estimated: 58 mL/min — ABNORMAL LOW (ref >60)
Glucose, Bld: 132 mg/dL — ABNORMAL HIGH (ref 70–99)
Potassium: 4.2 mmol/L (ref 3.5–5.1)
Sodium: 137 mmol/L (ref 135–145)

## 2021-03-19 LAB — CBC
HCT: 38.2 % (ref 36.0–46.0)
Hemoglobin: 12.3 g/dL (ref 12.0–15.0)
MCH: 28.7 pg (ref 26.0–34.0)
MCHC: 32.2 g/dL (ref 30.0–36.0)
MCV: 89.3 fL (ref 80.0–100.0)
Platelets: 248 10*3/uL (ref 150–400)
RBC: 4.28 MIL/uL (ref 3.87–5.11)
RDW: 14.6 % (ref 11.5–15.5)
WBC: 7.8 10*3/uL (ref 4.0–10.5)
nRBC: 0 % (ref 0.0–0.2)

## 2021-03-19 NOTE — Telephone Encounter (Signed)
Message received from [facility] noting that the patient has been approved for [employer]

## 2021-03-19 NOTE — Telephone Encounter (Signed)
Pt's granddaughter [name] came in the office asking if a scooter can be added on to the orders as well for her grandmother Anna [name].

## 2021-03-19 NOTE — ED Triage Notes (Signed)
Pt here from [facility] called out for low heart rate in the 40's afib , pt has no complaints

## 2021-03-19 NOTE — Progress Notes (Signed)
HFU  Needs pain management referral

## 2021-03-19 NOTE — ED Notes (Signed)
Upon arrival to the room, patient became very upset and tearful. Patient not wanting to get out of the wheelchair. Patient provided encouragement and family support. But still refusing to get on the stretcher. Patient allowed to stay in wheelchair until RN and/or EDP comes.

## 2021-03-19 NOTE — Discharge Instructions (Addendum)
Keep your appointment with Dr. Mayford Knife for this Friday.  Return here if you feel as if you are going to pass out, become short of breath, or any other problems

## 2021-03-19 NOTE — ED Provider Notes (Signed)
[facility] EMERGENCY DEPARTMENT Provider Note   CSN: [id number] Arrival date & time: 03/19/21  1312     History No chief complaint on file.   Anna [name] is a 85 y.o. female.  85 year old female presents due to concern for asymptomatic bradycardia.  She denies any associated chest pain or shortness of breath.  Denies any syncope or near syncope.  Was at her doctor's office today for follow-up appointment and was noted to have a slow heart rate.  Patient does have a history of A. fib and does take Xarelto.  It does not appear that she takes any beta-blockers.  Denies any changes to her medication.  Was sent here for further management        Past Medical History:  Diagnosis Date  . Hyperlipidemia   . Hypertension   . Osteoarthritis   . Overweight(278.02)   . Permanent atrial fibrillation [facility])     Patient Active Problem List   Diagnosis Date Noted  . Anemia due to stage 3a chronic kidney disease (HCC) 03/07/2020  . Stage 3a chronic kidney disease (HCC) 03/07/2020  . Mixed incontinence urge and stress 03/09/2018  . Prediabetes 03/09/2018  . OA (osteoarthritis) of knee 03/09/2018  . Sinus bradycardia 12/18/2016  . Estrogen deficiency 07/03/2016  . Mitral regurgitation 03/01/2014  . Atrial flutter (HCC) 03/01/2014  . Permanent atrial fibrillation (HCC) 12/24/2013  . Essential hypertension, benign 12/24/2013  . Heart murmur 12/24/2013  . Hyperlipemia 12/24/2013    Past Surgical History:  Procedure Laterality Date  . CARDIOVERSION N/A 03/24/2014   Procedure: CARDIOVERSION;  Surgeon: Quintella Reichert, MD;  Location: [facility];  Service: Cardiovascular;  Laterality: N/A;     OB History   No obstetric history on file.     Family History  Problem Relation Age of Onset  . Alzheimer's disease Mother     Social History   Tobacco Use  . Smoking status: Never Smoker  . Smokeless tobacco: Never Used  Substance Use Topics  . Alcohol use: No   . Drug use: No    Home Medications Prior to Admission medications   Medication Sig Start Date End Date Taking? Authorizing Provider  amlodipine-olmesartan (AZOR) 10-20 MG tablet TAKE 1 TABLET BY MOUTH EVERY DAY 03/15/21   Marcine Matar, MD  cyclobenzaprine (FLEXERIL) 10 MG tablet Take 0.5 tablets (5 mg total) by mouth 2 (two) times daily as needed for muscle spasms. 03/15/21   Curatolo, Adam, DO  diclofenac Sodium (VOLTAREN) 1 % GEL APPLY 2 GRAMS TO AFFECTED AREA 4 TIMES A DAY 03/15/21   Marcine Matar, MD  docusate sodium (COLACE) 100 MG capsule Take 100 mg by mouth daily.     [provider]  ferrous sulfate 325 (65 FE) MG tablet Take 325 mg by mouth daily with breakfast.    [provider]  gabapentin (NEURONTIN) 100 MG capsule TAKE 1 CAPSULE BY MOUTH THREE TIMES A DAY 03/15/21   Marcine Matar, MD  metFORMIN (GLUCOPHAGE) 500 MG tablet TAKE 1 TABLET BY MOUTH EVERY DAY WITH BREAKFAST 03/15/21   Marcine Matar, MD  methylPREDNISolone (MEDROL DOSEPAK) 4 MG TBPK tablet Follow package insert 03/15/21   Curatolo, Adam, DO  Multiple Vitamins-Minerals (CENTRUM SILVER PO) Take 1 tablet by mouth daily.     [provider]  Omega-3 Fatty Acids (FISH OIL CONCENTRATE PO) Take 1 capsule by mouth daily.     [provider]  rivaroxaban (XARELTO) 20 MG TABS tablet Take  1 tablet (20 mg total) by mouth daily with supper. Due for follow-up, MUST schedule follow-up MD appt for FUTURE refills. 03/15/21   Quintella Reichert, MD  rosuvastatin (CRESTOR) 10 MG tablet TAKE 1 TABLET BY MOUTH EVERY DAY 11/15/20   Quintella Reichert, MD    Allergies    Patient has no known allergies.  Review of Systems   Review of Systems  All other systems reviewed and are negative.   Physical Exam Updated Vital Signs BP (!) 183/72 (BP Location: Right Arm)   Pulse (!) 57   Temp (!) 97.4 F (36.3 C)   Resp 15   SpO2 98%   Physical Exam Vitals and nursing note reviewed.   Constitutional:      General: She is not in acute distress.    Appearance: Normal appearance. She is well-developed. She is not toxic-appearing.  HENT:     Head: Normocephalic and atraumatic.  Eyes:     General: Lids are normal.     Conjunctiva/sclera: Conjunctivae normal.     Pupils: Pupils are equal, round, and reactive to light.  Neck:     Thyroid: No thyroid mass.     Trachea: No tracheal deviation.  Cardiovascular:     Rate and Rhythm: Bradycardia present. Rhythm irregular.     Heart sounds: Normal heart sounds. No murmur heard. No gallop.   Pulmonary:     Effort: Pulmonary effort is normal. No respiratory distress.     Breath sounds: Normal breath sounds. No stridor. No decreased breath sounds, wheezing, rhonchi or rales.  Abdominal:     General: Bowel sounds are normal. There is no distension.     Palpations: Abdomen is soft.     Tenderness: There is no abdominal tenderness. There is no rebound.  Musculoskeletal:        General: No tenderness. Normal range of motion.     Cervical back: Normal range of motion and neck supple.  Skin:    General: Skin is warm and dry.     Findings: No abrasion or rash.  Neurological:     Mental Status: She is alert and oriented to person, place, and time.     GCS: GCS eye subscore is 4. GCS verbal subscore is 5. GCS motor subscore is 6.     Cranial Nerves: No cranial nerve deficit.     Sensory: No sensory deficit.  Psychiatric:        Speech: Speech normal.        Behavior: Behavior normal.     ED Results / Procedures / Treatments   Labs (all labs ordered are listed, but only abnormal results are displayed) Labs Reviewed  BASIC METABOLIC PANEL - Abnormal; Notable for the following components:      Result Value   Glucose, Bld 132 (*)    GFR, Estimated 58 (*)    All other components within normal limits  CBC    EKG EKG Interpretation  Date/Time:  Monday Mar 19 2021 13:57:00 EDT Ventricular Rate:  49 PR Interval:    QRS  Duration: 118 QT Interval:  426 QTC Calculation: 384 R Axis:   -64 Text Interpretation: Atrial flutter with variable A-V block Left axis deviation Left ventricular hypertrophy with QRS widening and repolarization abnormality ( R in aVL , Cornell product ) Cannot rule out Septal infarct , age undetermined Abnormal ECG Confirmed by Lorre Nick ([id number]) on 03/19/2021 4:33:18 PM   Radiology DG Chest 2 View  Result Date: 03/19/2021 CLINICAL DATA:  Weakness shortness of breath chest pain cough and congestion. EXAM: CHEST - 2 VIEW COMPARISON:  None FINDINGS: Heart size is enlarged. The central vascular engorgement is noted. Nodular density projecting along the LEFT heart border measuring up to 13 mm. No lobar consolidation.  No sign of pleural effusion. On limited assessment no acute skeletal process. IMPRESSION: 1. Cardiomegaly with central vascular engorgement, no signs of edema, effusion or lobar consolidation. 2. Nodular density projecting along the LEFT heart border. Consider non emergent follow-up chest CT for further evaluation or PA and lateral chest if there are symptoms of infection. Electronically Signed   By: Donzetta Kohut M.D.   On: 03/19/2021 14:06    Procedures Procedures   Medications Ordered in ED Medications - No data to display  ED Course  I have reviewed the triage vital signs and the nursing notes.  Pertinent labs & imaging results that were available during my care of the patient were reviewed by me and considered in my medical decision making (see chart for details).    MDM Rules/Calculators/A&P                          Case discussed with cardiologist, Dr. Rennis Golden, on-call and they have reviewed patient's EKG.  Agree that patient can follow-up with Dr. Mayford Knife as an outpatient as patient is already anticoagulated and rate is controlled.  Family agreeable to this Final Clinical Impression(s) / ED Diagnoses Final diagnoses:  None    Rx / DC Orders ED Discharge Orders     None       Lorre Nick, MD 03/19/21 1735

## 2021-03-19 NOTE — ED Provider Notes (Signed)
Emergency Medicine Provider Triage Evaluation Note  Anna [name] , a 85 y.o. female  was evaluated in triage.  Pt complains of low heart rate. Sent by urgent care. Denies symptoms. States history of A. fib Compliant with her medications.  Review of Systems  Positive: none Negative: Chest pain, shortness of breath, palpitations  Physical Exam  BP (!) 183/72 (BP Location: Right Arm)   Pulse (!) 57   Temp (!) 97.4 F (36.3 C)   Resp 15   SpO2 98%  Gen:   Awake, no distress   Resp:  Normal effort  MSK:   Moves extremities without difficulty  Other:  Heart rate in the 50s  Medical Decision Making  Medically screening exam initiated at 2:02 PM.  Appropriate orders placed.  TYNASIA MCCAUL was informed that the remainder of the evaluation will be completed by another provider, this initial triage assessment does not replace that evaluation, and the importance of remaining in the ED until their evaluation is complete.  Labwork ordered by triage, no chest pain so will hold off on troponin.   Dietrich Pates, PA-C 03/19/21 1403    Rozelle Logan, DO 03/19/21 1418

## 2021-03-29 ENCOUNTER — Telehealth: Payer: Self-pay | Admitting: Internal Medicine

## 2021-03-29 ENCOUNTER — Other Ambulatory Visit: Payer: Self-pay

## 2021-03-29 DIAGNOSIS — M170 Bilateral primary osteoarthritis of knee: Secondary | ICD-10-CM

## 2021-03-29 MED ORDER — MISC. DEVICES MISC: 0 refills | Status: AC

## 2021-03-29 NOTE — Telephone Encounter (Signed)
Pt's daughter called asking if Dr, Laural Benes could refill the muscle relaxer that was prescribed by the hospital.  [facility] [address]  CB#  [phone]

## 2021-03-29 NOTE — Telephone Encounter (Signed)
Call placed to patient's granddaughter, Osvaldo Human # [phone] to provide an update on the orders that have been placed. Message left with call back requested to Erskine Squibb or [place].    The orders for the hospital bed, rollator and power chair were placed by Ricky Stabs, NP after patient was seen and they have been re-sent to [facility]. The insurance may not cover a rollator and a power chair.  The power chair can take months to receive. Another appointment with the provider to  address the need for the power chair is required by the insurance company.   An order was placed for PT but it was sent for outpatient therapy and will need to be re-submitted as an ambulatory referral for home health PT.   The Mom's Meals should have been received.  This is only short term and the patient should be put on a waiting list for meals on wheels by calling [facility] # [phone].

## 2021-03-29 NOTE — Telephone Encounter (Signed)
Copied from CRM [id number]. Topic: General - Inquiry >> Mar 28, 2021 12:32 PM Aretta Nip wrote: Pls have Dr Shela Commons nurse call Anna [name], pt grandaughter back at [phone]. She is upset that some of the orders have not gone thru for pt and states they have not heard at all from [facility] and she was told it had all been set up, do not see in epic but told caller that office was at lunch and we would call back and verfiy that all was in order.FU   Would one of you be able to assist patient further? Please follow up if appropriate.

## 2021-03-29 NOTE — Telephone Encounter (Signed)
Pt is following up on the previous message, please advise if the orders have been placed, pt would like a call back.

## 2021-03-30 ENCOUNTER — Telehealth: Payer: Self-pay | Admitting: Internal Medicine

## 2021-03-30 DIAGNOSIS — M179 Osteoarthritis of knee, unspecified: Secondary | ICD-10-CM | POA: Diagnosis not present

## 2021-03-30 MED ORDER — CYCLOBENZAPRINE HCL 10 MG PO TABS: 5.0000 mg | ORAL_TABLET | Freq: Two times a day (BID) | ORAL | 0 refills | Status: DC | PRN

## 2021-03-30 NOTE — Telephone Encounter (Signed)
Anna [name] is there an easy way to get everything done?

## 2021-03-30 NOTE — Telephone Encounter (Signed)
Please advise 

## 2021-03-30 NOTE — Telephone Encounter (Signed)
Will forward to provider to refill rx    Referral info:     Sent referral to [facility] for Pain Management Ph. # [phone] Address [address] [place], [place] [address].

## 2021-03-30 NOTE — Telephone Encounter (Signed)
Pt granddaughter [name] (GRANDDAUGHTER)- is requesting pts height and weight to be sent to [facility] for the pts bed.   And a contact information needs to be sent to [facility] as well.  A Rolator was ordered. Pt is needing a walker with wheels and it is too heavy. Can a walker with no wheels be ordered?  [name] is asking is an order needed for half rails? And checking on the status.  And an order was placed for a power wheelchair. Is this a scooter?  [facility] said that they did not see an order. Can this be resent please?  Please advise [phone]

## 2021-03-30 NOTE — Telephone Encounter (Unsigned)
Copied from CRM [id number]. Topic: General - Other >> Mar 30, 2021  9:18 AM Mcneil, Ja-Kwan wrote: Reason for CRM: Pt grand daughter [name] stated they have not heard from pain management and she would like an update on the Rx for cyclobenzaprine (FLEXERIL) 10 MG tablet that was [id number], [place] - [address].

## 2021-03-30 NOTE — Telephone Encounter (Signed)
Relayed message from Jay'A to [facility] regarding pain management & Meds sent to the pharmacy for the pt. [name] wanted an update on the half rails order.   And have the height & weights for [facility] for the hospital bed/ Please advise CB- [phone]

## 2021-03-30 NOTE — Telephone Encounter (Signed)
Returned [place] call and made aware of rx being sent to pharmacy and pain management referral and have provided phone number and if she has any questions or concerns to give [place] a call

## 2021-04-03 NOTE — Telephone Encounter (Signed)
[facility] was following up on the orders, stated they also need notes stating why the pt needs the hospital bed and other supplies. Please advise.

## 2021-04-03 NOTE — Telephone Encounter (Signed)
Provider's clinical note from 03/19/2021 that includes height and weigh has been faxed to [facility]

## 2021-04-04 NOTE — Telephone Encounter (Signed)
Completed PCS form has been faxed to [facility]

## 2021-04-05 ENCOUNTER — Telehealth: Payer: Self-pay | Admitting: Internal Medicine

## 2021-04-05 DIAGNOSIS — Z79899 Other long term (current) drug therapy: Secondary | ICD-10-CM | POA: Diagnosis not present

## 2021-04-05 DIAGNOSIS — M545 Low back pain, unspecified: Secondary | ICD-10-CM | POA: Diagnosis not present

## 2021-04-05 DIAGNOSIS — M5416 Radiculopathy, lumbar region: Secondary | ICD-10-CM | POA: Diagnosis not present

## 2021-04-05 NOTE — Telephone Encounter (Signed)
Called to request more information for an order that was received for a hospital bed.  Stated that they need the bed narrative in the notes.  Please advise and call to discuss at [phone]

## 2021-04-05 NOTE — Telephone Encounter (Signed)
Call returned to [facility], spoke to [place] who stated that they are looking for the narrative with the office visit note that supports why a hospital bed is needed.  Informed her that a fax was also sent to this CM and the information has been forwarded to the provider who had the encounter with the patient on 03/19/2021.

## 2021-04-05 NOTE — Telephone Encounter (Signed)
Erskine Squibb would be able to follow up with this to see what needs to be done

## 2021-04-05 NOTE — Telephone Encounter (Signed)
Call placed to patient's granddaughter,[name] Hyacinth Meeker to review current orders for DME and services. Mliss Fritz, CMA present for the call. Message left with call back requested

## 2021-04-06 NOTE — Telephone Encounter (Signed)
As requested I have created an addendum to my note for visit on 03/19/2021 to include hospital bed clinical documentation statement.

## 2021-04-06 NOTE — Telephone Encounter (Signed)
Pt's granddaughter returned call, she called to report that she needs a Bed Narrative sent to [facility] and also a power wheel chair.

## 2021-04-06 NOTE — Telephone Encounter (Signed)
Pt's granddaughter called reporting that after speaking with [facility] they are requesting the PCP to send over bed narrative. She has been sleeping in a recliner. They need relief for her as soon as possible.  [phone]

## 2021-04-09 ENCOUNTER — Telehealth: Payer: Self-pay

## 2021-04-09 DIAGNOSIS — M25562 Pain in left knee: Secondary | ICD-10-CM | POA: Diagnosis not present

## 2021-04-09 DIAGNOSIS — M5410 Radiculopathy, site unspecified: Secondary | ICD-10-CM | POA: Diagnosis not present

## 2021-04-09 DIAGNOSIS — Z79899 Other long term (current) drug therapy: Secondary | ICD-10-CM | POA: Diagnosis not present

## 2021-04-09 DIAGNOSIS — M25561 Pain in right knee: Secondary | ICD-10-CM | POA: Diagnosis not present

## 2021-04-09 DIAGNOSIS — G8929 Other chronic pain: Secondary | ICD-10-CM | POA: Diagnosis not present

## 2021-04-09 NOTE — Telephone Encounter (Signed)
Documentation addendum for hospital bed has been faxed to [facility]

## 2021-04-09 NOTE — Telephone Encounter (Signed)
On 04/06/2021 at 3:07 pm I responded with the following:  As requested I have created an addendum to my note for visit on 03/19/2021 to include hospital bed clinical documentation statement.

## 2021-04-11 ENCOUNTER — Ambulatory Visit: Payer: Medicare Other | Attending: Family | Admitting: Physical Therapy

## 2021-04-11 ENCOUNTER — Encounter: Payer: Self-pay | Admitting: Physical Therapy

## 2021-04-11 ENCOUNTER — Other Ambulatory Visit: Payer: Self-pay

## 2021-04-11 DIAGNOSIS — M5441 Lumbago with sciatica, right side: Secondary | ICD-10-CM

## 2021-04-11 DIAGNOSIS — G8929 Other chronic pain: Secondary | ICD-10-CM | POA: Diagnosis not present

## 2021-04-11 NOTE — Patient Instructions (Signed)
Access Code: [email or web address] URL: [email or web address] Date: 04/11/2021 Prepared by: Alphonzo Severance  Exercises Seated Shoulder Row with Anchored Resistance - 1 x daily - 7 x weekly - 3 sets - 10 reps Seated Ankle Pumps - 1 x daily - 7 x weekly - 3 sets - 10 reps Seated Long Arc Quad - 1 x daily - 7 x weekly - 3 sets - 10 reps Seated Hamstring Curl with Anchored Resistance - 1 x daily - 7 x weekly - 3 sets - 10 reps Seated Hip Abduction - 1 x daily - 7 x weekly - 3 sets - 10 reps

## 2021-04-11 NOTE — Therapy (Addendum)
[facility] [address] [place], [place], [address] Phone: [phone]   Fax:  [phone]  Physical Therapy Evaluation  Patient Details  Name: Anna [name] MRN: [id number] Date of Birth: 09-16-1936 Referring Provider (PT): Rema Fendt, NP  Encounter Date: 04/11/2021   PT End of Session - 04/11/21 1907     Visit Number 1    Number of Visits 1    PT Start Time 0200    PT Stop Time 0245    PT Time Calculation (min) 45 min    Equipment Utilized During Treatment Gait belt    Activity Tolerance Patient tolerated treatment well;Patient limited by fatigue;Patient limited by pain    Behavior During Therapy WFL for tasks assessed/performed             Past Medical History:  Diagnosis Date   Hyperlipidemia    Hypertension    Osteoarthritis    Overweight(278.02)    Permanent atrial fibrillation [facility])     Past Surgical History:  Procedure Laterality Date   CARDIOVERSION N/A 03/24/2014   Procedure: CARDIOVERSION;  Surgeon: Quintella Reichert, MD;  Location: [facility] ENDOS[facility];  Service: Cardiovascular;  Laterality: N/A;    There were no vitals filed for this visit.    Subjective Assessment - 04/11/21 1422     Subjective Anna [name] is a 85 y.o. female who presents to clinic with chief complaint of low back pain.  MOI/History of condition: Chronic LBP with radicular pain with acute exacerbation following fall in fall at the beginning of month (03/30/21).  She reached over her walker forward and fell over backwards when returning to standing.  Denies LOC.  Pain location: low back pain with radiating pain through posterior of both legs to bil knees.  Red flags: denies n/t and bb changes.  48 hour pain intensity:  highest 10/10, current 1/10, best 1/10.  Aggs: readjusting at night, moving into lumbar ext in recliner.  Eases: medication.  Nature: sharp, shooting  Severity: moderate  Irritability: mod Stage: subacute.  Stability: staying  about the same.  24 hour pattern: worst at night  Vocation/requirements: retired.  Functional limitations/goals: be independent with FWW for ambulation.  Home environment: living alone with family next door.  Ramp to enter, 2 STE, not driving.  Assistive device: baseline FWW, now using a W/C for longer distances.    Pertinent History DMTII, AFib, mitral regurgitation    Diagnostic tests no acute fracture on imaging after fall    Patient Stated Goals see subjective                      Objective measurements completed on examination: See above findings.      [facility] PT Assessment - 04/12/21 0001       Assessment   Medical Diagnosis Acute low back pain with bilateral sciatica, unspecified back pain laterality (M54.42, M54.41)    Referring Provider (PT) Rema Fendt, NP    Onset Date/Surgical Date 03/30/21    Hand Dominance Right    Next MD Visit 6/28    Prior Therapy none      Precautions   Precaution Comments FALL RISK      Restrictions   Other Position/Activity Restrictions none      Balance Screen   Has the patient fallen in the past 6 months Yes    How many times? 3    Has the patient had a decrease in activity level because of a  fear of falling?  Yes    Is the patient reluctant to leave their home because of a fear of falling?  Yes      Prior Function   Level of Independence Independent with basic ADLs      Strength   Overall Strength Comments LE strength is grossly 3/5 in available range      Palpation   Palpation comment TTP lumbar paraspinals      Balance   Balance Assessed Yes    Balance comment unable to stand without high level of UE support                Plan - 04/11/21 1908     Clinical Impression Statement Given high level of deconditioning, pts inability to drive, lack of other transportation, and burden of leaving home (physically and financially) I reccomend that pt seek referral to home health PT; pt and granddaughter agree to  plan.    PT Home Exercise Plan [email or web address]    Recommended Other Services HHPT    Consulted and Agree with Plan of Care Family member/caregiver;Patient    Family Member Consulted granddaughter             Patient will benefit from skilled therapeutic intervention in order to improve the following deficits and impairments:     Visit Diagnosis: Chronic bilateral low back pain with right-sided sciatica     Problem List Patient Active Problem List   Diagnosis Date Noted   Anemia due to stage 3a chronic kidney disease (HCC) 03/07/2020   Stage 3a chronic kidney disease (HCC) 03/07/2020   Mixed incontinence urge and stress 03/09/2018   Prediabetes 03/09/2018   OA (osteoarthritis) of knee 03/09/2018   Sinus bradycardia 12/18/2016   Estrogen deficiency 07/03/2016   Mitral regurgitation 03/01/2014   Atrial flutter (HCC) 03/01/2014   Permanent atrial fibrillation (HCC) 12/24/2013   Essential hypertension, benign 12/24/2013   Heart murmur 12/24/2013   Hyperlipemia 12/24/2013    Alphonzo Severance PT, DPT 04/12/21 10:19 AM  [facility] [address] [place], [place], [address] Phone: [phone]   Fax:  [phone]  Name: Anna [name] MRN: [id number] Date of Birth: 1936/10/10

## 2021-04-12 DIAGNOSIS — M545 Low back pain, unspecified: Secondary | ICD-10-CM | POA: Diagnosis not present

## 2021-04-12 DIAGNOSIS — M170 Bilateral primary osteoarthritis of knee: Secondary | ICD-10-CM | POA: Diagnosis not present

## 2021-04-12 NOTE — Addendum Note (Signed)
Addended by: Alphonzo Severance E on: 04/12/2021 10:20 AM   Modules accepted: Orders

## 2021-04-13 ENCOUNTER — Telehealth: Payer: Self-pay | Admitting: Cardiology

## 2021-04-13 ENCOUNTER — Other Ambulatory Visit (HOSPITAL_COMMUNITY): Payer: Medicare Other

## 2021-04-13 DIAGNOSIS — I340 Nonrheumatic mitral (valve) insufficiency: Secondary | ICD-10-CM

## 2021-04-13 NOTE — Telephone Encounter (Signed)
PT is calling in to let the doctor know she is not feeling well.She wants to reschedule her appt for 05/04/2021 at 3:55 but when it trys to reschedule it says the order for the ECHO expires 04/13/2021.PT request a message be sent to get a new order so she can get the ECHO done on 05/04/2021

## 2021-04-13 NOTE — Telephone Encounter (Signed)
New order has been placed.  Will send to echo scheduler to get pt's appt rescheduled.

## 2021-04-18 ENCOUNTER — Telehealth: Payer: Self-pay

## 2021-04-18 NOTE — Telephone Encounter (Signed)
I have signed and dated the requested documentation for completion.

## 2021-04-18 NOTE — Telephone Encounter (Signed)
Call placed to [facility] regarding PCS referral.  Spoke to [place] who stated that the referral was received and rejected because the provider who had the face to face encounter with the patient did not sign the form.  They will not accept Dr Henriette Combs signature. The application will need to be re-submitted with Amy Stephens,NP signature.  The second page of the application will need to be updated.

## 2021-04-19 ENCOUNTER — Telehealth: Payer: Self-pay

## 2021-04-19 NOTE — Telephone Encounter (Signed)
Updated/signed PCS faxed back to [employer]

## 2021-04-19 NOTE — Telephone Encounter (Signed)
Call placed to [facility] to check on status of DME orders. Spoke to [place] who confirmed that the hospital bed, mattress, side rails and rollator were delivered 04/12/2021.

## 2021-04-23 DIAGNOSIS — Z79899 Other long term (current) drug therapy: Secondary | ICD-10-CM | POA: Diagnosis not present

## 2021-04-23 DIAGNOSIS — M5410 Radiculopathy, site unspecified: Secondary | ICD-10-CM | POA: Diagnosis not present

## 2021-04-23 DIAGNOSIS — M25562 Pain in left knee: Secondary | ICD-10-CM | POA: Diagnosis not present

## 2021-04-23 DIAGNOSIS — M25561 Pain in right knee: Secondary | ICD-10-CM | POA: Diagnosis not present

## 2021-04-23 DIAGNOSIS — G8929 Other chronic pain: Secondary | ICD-10-CM | POA: Diagnosis not present

## 2021-04-24 ENCOUNTER — Ambulatory Visit: Payer: Medicare Other | Attending: Internal Medicine | Admitting: Internal Medicine

## 2021-04-24 ENCOUNTER — Other Ambulatory Visit: Payer: Self-pay

## 2021-04-24 ENCOUNTER — Encounter: Payer: Self-pay | Admitting: Internal Medicine

## 2021-04-24 DIAGNOSIS — M5416 Radiculopathy, lumbar region: Secondary | ICD-10-CM | POA: Diagnosis not present

## 2021-04-24 DIAGNOSIS — M170 Bilateral primary osteoarthritis of knee: Secondary | ICD-10-CM

## 2021-04-24 DIAGNOSIS — R296 Repeated falls: Secondary | ICD-10-CM

## 2021-04-24 DIAGNOSIS — L989 Disorder of the skin and subcutaneous tissue, unspecified: Secondary | ICD-10-CM

## 2021-04-24 DIAGNOSIS — Z8781 Personal history of (healed) traumatic fracture: Secondary | ICD-10-CM | POA: Diagnosis not present

## 2021-04-24 DIAGNOSIS — R269 Unspecified abnormalities of gait and mobility: Secondary | ICD-10-CM | POA: Diagnosis not present

## 2021-04-24 NOTE — Patient Instructions (Signed)
Please purchase Os-Cal plus vitamin D over the counter 600mg /400 IU and take one tablet twice a day.  Once you staart the Oscal+Vitamin D, you can stop the Multivitamin tablet.

## 2021-04-24 NOTE — Progress Notes (Signed)
Patient ID: [name], female    DOB: 28-Jun-1936  MRN: [id number]  CC: Mobility assessment   Subjective: Anna [name] is a 85 y.o. female who presents for ability assessment.  Her granddaughter Anna [name] is with her. Her concerns today include:  history of atrial flutter on Xarelto with history of cardioversion 2015, HTN, trivial AR, mild MR,OA knees, HL, prediabetes, CKD 3, ACD.  Patient presents today for mobility assessment to see if she would qualify for a motorized wheelchair.  She has known severe arthritis of both knees.  Since last visit with me, she had fallen in May 2022 and was seen in the emergency room with lumbar radiculopathy/lower back pain.  Granddaughter reports that this is the third fall she has had in the past year despite using her cane and walker.  Plain x-ray revealed T12 compression fracture age indeterminant with multilevel degenerative disc disease of the lumbar spine.  Today she denies any loss of bowel or bladder function.  Reports significant pain in the back that goes down her legs. Rates 8/10 in knees and back.  She has had one visit with the physical therapist so far.  She is now being seen by [facility] pain clinic and is on hydrocodone with Flexeril which she finds helpful.  Hydrocodone 5/325 mg frequency just increased from Q 8 hrs to 1 tablet every 6 hours and Flexeril 10 mg half a tablet twice a day as needed.  Denies any significant constipation or increased drowsiness with these medications.  Granddaughter confirms that she does have a Narcan nasal kit at home and granddaughter has been shown how to use it.  She has an appointment coming up with the spine orthopedics specialist Dr. Herma Mering next month.  Patient's granddaughter now stays with her.  They both feel she would benefit from having a motorized wheelchair. -Patient has a manual wheelchair, walker and cane at home. -She does not use the 4-prong cane because she feels too unsteady on her feet with it and  pain in her knees and lower back too severe. -Even with use of her walker, patient reports that her legs/knees give out and she has had several near falls while trying to use it. -Patient reports that the strength in her hands and arms is too weak to allow her to self propel in her wheelchair.  Her granddaughter has to propel her in the wheelchair.  Patient has a ramp in front of her home that is very steep.  Granddaughter reports a difficult time and trying to propel her up and down the ramp. With a motorized chair, patient feels she would be more independent in getting in and out of her house using her ramp.  It would also allow for her to move around in her kitchen to do some cooking.  At this time she is unable to do so due to significant arthritis and pain in the knees and back.  She also feels she would be better able to transfer from a motorized chair onto surfaces like her bed, sofa and the commode compared to with a manual wheelchair.   Patient Active Problem List   Diagnosis Date Noted   Anemia due to stage 3a chronic kidney disease ([place]) 03/07/2020   Stage 3a chronic kidney disease ([place]) 03/07/2020   Mixed incontinence urge and stress 03/09/2018   Prediabetes 03/09/2018   OA (osteoarthritis) of knee 03/09/2018   Sinus bradycardia 12/18/2016   Estrogen deficiency 07/03/2016   Mitral regurgitation 03/01/2014  Atrial flutter ([place]) 03/01/2014   Permanent atrial fibrillation ([place]) 12/24/2013   Essential hypertension, benign 12/24/2013   Heart murmur 12/24/2013   Hyperlipemia 12/24/2013     Current Outpatient Medications on File Prior to Visit  Medication Sig Dispense Refill   amlodipine-olmesartan (AZOR) 10-20 MG tablet TAKE 1 TABLET BY MOUTH EVERY DAY 90 tablet 0   cyclobenzaprine (FLEXERIL) 10 MG tablet Take 0.5 tablets (5 mg total) by mouth 2 (two) times daily as needed for muscle spasms. (Patient not taking: Reported on 04/11/2021) 20 tablet 0   diclofenac Sodium (VOLTAREN) 1 %  GEL APPLY 2 GRAMS TO AFFECTED AREA 4 TIMES A DAY 400 g 0   docusate sodium (COLACE) 100 MG capsule Take 100 mg by mouth daily.      ferrous sulfate 325 (65 FE) MG tablet Take 325 mg by mouth daily with breakfast.     gabapentin (NEURONTIN) 100 MG capsule TAKE 1 CAPSULE BY MOUTH THREE TIMES A DAY 270 capsule 0   metFORMIN (GLUCOPHAGE) 500 MG tablet TAKE 1 TABLET BY MOUTH EVERY DAY WITH BREAKFAST 90 tablet 0   methylPREDNISolone (MEDROL DOSEPAK) 4 MG TBPK tablet Follow package insert (Patient not taking: Reported on 04/11/2021) 21 each 0   Misc. Devices Forensic scientist. Devices MISC Rollator walker 1 Device 0   Misc. [place] [facility] bed 1 Device 0   Multiple Vitamins-Minerals (CENTRUM SILVER PO) Take 1 tablet by mouth daily.      Omega-3 Fatty Acids (FISH OIL CONCENTRATE PO) Take 1 capsule by mouth daily.      rivaroxaban (XARELTO) 20 MG TABS tablet Take 1 tablet (20 mg total) by mouth daily with supper. Due for follow-up, MUST schedule follow-up MD appt for FUTURE refills. 90 tablet 0   rosuvastatin (CRESTOR) 10 MG tablet TAKE 1 TABLET BY MOUTH EVERY DAY 90 tablet 3   No current facility-administered medications on file prior to visit.    No Known Allergies  Social History   Socioeconomic History   Marital status: Divorced    Spouse name: Not on file   Number of children: Not on file   Years of education: Not on file   Highest education level: Not on file  Occupational History   Not on file  Tobacco Use   Smoking status: Never   Smokeless tobacco: Never  Substance and Sexual Activity   Alcohol use: No   Drug use: No   Sexual activity: Not on file  Other Topics Concern   Not on file  Social History Narrative   Not on file   Social Determinants of Health   Financial Resource Strain: Not on file  Food Insecurity: Not on file  Transportation Needs: Not on file  Physical Activity: Not on file  Stress: Not on file  Social Connections: Not on  file  Intimate Partner Violence: Not on file    Family History  Problem Relation Age of Onset   Alzheimer's disease Mother     Past Surgical History:  Procedure Laterality Date   CARDIOVERSION N/A 03/24/2014   Procedure: CARDIOVERSION;  Surgeon: Anna Margarita, MD;  Location: [place];  Service: Cardiovascular;  Laterality: N/A;    ROS: Review of Systems Negative except as stated above  PHYSICAL EXAM: BP 128/66   Pulse 63   Resp 16   Wt 187 lb 12.8 oz (85.2 kg)   SpO2 98%   BMI 33.28 kg/m   Physical Exam  General appearance -  alert, well appearing, elderly Caucasian female and in no distress.  Patient is sitting in a manual wheelchair.  She appears well.  Clothing clean. Mental status - normal mood, behavior, speech, dress, motor activity, and thought processes.  She does not appear drowsy. Chest - clear to auscultation, no wheezes, rales or rhonchi, symmetric air entry Heart - normal rate, regular rhythm, normal S1, S2, no murmurs, rubs, clicks or gallops Neurological -grip 3/5 bilaterally.  Slight wasting in the intrinsic muscles of the hands.  Power in both upper extremities proximally 2-3/5 and distally 4/5.  Power in the lower extremities distally BL 3/5 and proximally 3-4/5 bilaterally.  Patient requires full assistance on either side to stand and able to stand for only a several seconds MSK: Knees -bilateral enlargement of the knee joints.  She has moderate to severe discomfort with attempted passive range of motion of the knees.  She does have changes of osteoarthritis in both hands at the PIP and DIP joints.  Shoulders: No point tenderness of the glenohumeral joints.  She has slowed range of motion the shoulder jts in all directions Extremities -trace bilateral lower extremity edema. Skin: Noted to have callus-like lesion on the left forearm close to the cubital fossa that measures about 2 cm in greatest diameter.  Smaller one noted also on the dorsal surface of the  left hand  CMP Latest Ref Rng & Units 03/19/2021 07/14/2020 07/07/2020  Glucose 70 - 99 mg/dL 132(H) - 104(H)  BUN 8 - 23 mg/dL 22 - 16  Creatinine 0.44 - 1.00 mg/dL 0.97 - 1.07(H)  Sodium 135 - 145 mmol/L 137 - 142  Potassium 3.5 - 5.1 mmol/L 4.2 5.0 5.3(H)  Chloride 98 - 111 mmol/L 103 - 103  CO2 22 - 32 mmol/L 25 - 26  Calcium 8.9 - 10.3 mg/dL 9.9 - 10.1  Total Protein 6.0 - 8.5 g/dL - - 7.5  Total Bilirubin 0.0 - 1.2 mg/dL - - 0.5  Alkaline Phos 48 - 121 IU/L - - 84  AST 0 - 40 IU/L - - 23  ALT 0 - 32 IU/L - - 10   Lipid Panel     Component Value Date/Time   CHOL 108 07/07/2020 1421   TRIG 67 07/07/2020 1421   HDL 47 07/07/2020 1421   CHOLHDL 2.3 07/07/2020 1421   CHOLHDL 1.9 06/29/2015 0945   VLDL 14 06/29/2015 0945   LDLCALC 47 07/07/2020 1421    CBC    Component Value Date/Time   WBC 7.8 03/19/2021 1409   RBC 4.28 03/19/2021 1409   HGB 12.3 03/19/2021 1409   HGB 11.1 07/07/2020 1421   HCT 38.2 03/19/2021 1409   HCT 35.1 07/07/2020 1421   PLT 248 03/19/2021 1409   PLT 243 07/07/2020 1421   MCV 89.3 03/19/2021 1409   MCV 88 07/07/2020 1421   MCH 28.7 03/19/2021 1409   MCHC 32.2 03/19/2021 1409   RDW 14.6 03/19/2021 1409   RDW 13.3 07/07/2020 1421   LYMPHSABS 1.9 12/18/2016 1452   MONOABS 378 06/19/2016 1503   EOSABS 0.2 12/18/2016 1452   BASOSABS 0.1 12/18/2016 1452    ASSESSMENT AND PLAN: 1. Gait disturbance 2. Recurrent falls 3. Primary osteoarthritis of both knees 4. History of vertebral fracture 5. Lumbar radiculopathy -Patient seems appropriate for power wheelchair. Patient's significant painful osteoarthritis in both knees and recent lumbar radiculopathy with vertebral fracture have led to gait disturbance and falls.. -Strength in the right upper extremity and lower extremity is not sufficient  to participate in mobility related ADLs using a manual wheelchair. -A cane or walker would not suffice as patient has had falls despite using these devices  due to severe OA knees which causes knees to give out. -Patient's decrease strength, handgrip, and poor balance makes a power scooter inappropriate for her needs.  Grip is not adequate for the tiller type steering of a scooter. Power WC is recommended to allow pt to safely negotiate her home and community environment. It will allow her to access all rooms in her house including the kitchen, bedroom and bathroom. The patient has the mental capacity to operate a power wheelchair safely and is willing to use it in the home.  Given her vertebral fracture, I recommended that she takes Os-Cal plus vitamin D 600 mg / 400 IU twice a day.  This can be purchased over-the-counter.  She will stop the multivitamin tablet.  Form will be completed for CAP program that her grand-daugher also brought in today.  6. Skin lesion - Ambulatory referral to Dermatology    Patient was given the opportunity to ask questions.  Patient verbalized understanding of the plan and was able to repeat key elements of the plan.   No orders of the defined types were placed in this encounter.    Requested Prescriptions    No prescriptions requested or ordered in this encounter    No follow-ups on file.  Karle Plumber, MD, FACP

## 2021-04-29 DIAGNOSIS — M179 Osteoarthritis of knee, unspecified: Secondary | ICD-10-CM | POA: Diagnosis not present

## 2021-05-04 ENCOUNTER — Other Ambulatory Visit (HOSPITAL_COMMUNITY): Payer: Medicare Other

## 2021-05-07 ENCOUNTER — Encounter: Payer: Self-pay | Admitting: Cardiology

## 2021-05-07 ENCOUNTER — Other Ambulatory Visit (HOSPITAL_COMMUNITY): Payer: Self-pay | Admitting: Cardiology

## 2021-05-07 ENCOUNTER — Ambulatory Visit: Payer: Self-pay | Admitting: *Deleted

## 2021-05-07 ENCOUNTER — Ambulatory Visit (HOSPITAL_COMMUNITY)
Admission: RE | Admit: 2021-05-07 | Discharge: 2021-05-07 | Disposition: A | Discharge status: Home / Self Care | Payer: Medicare Other | Source: Ambulatory Visit | Attending: Cardiology | Admitting: Cardiology

## 2021-05-07 ENCOUNTER — Other Ambulatory Visit: Payer: Self-pay

## 2021-05-07 DIAGNOSIS — E785 Hyperlipidemia, unspecified: Secondary | ICD-10-CM | POA: Insufficient documentation

## 2021-05-07 DIAGNOSIS — I110 Hypertensive heart disease with heart failure: Secondary | ICD-10-CM | POA: Diagnosis not present

## 2021-05-07 DIAGNOSIS — I059 Rheumatic mitral valve disease, unspecified: Secondary | ICD-10-CM

## 2021-05-07 DIAGNOSIS — I050 Rheumatic mitral stenosis: Secondary | ICD-10-CM | POA: Insufficient documentation

## 2021-05-07 DIAGNOSIS — I342 Nonrheumatic mitral (valve) stenosis: Secondary | ICD-10-CM | POA: Diagnosis not present

## 2021-05-07 DIAGNOSIS — R7303 Prediabetes: Secondary | ICD-10-CM

## 2021-05-07 DIAGNOSIS — R9389 Abnormal findings on diagnostic imaging of other specified body structures: Secondary | ICD-10-CM

## 2021-05-07 LAB — ECHOCARDIOGRAM COMPLETE
Area-P 1/2: 3.85 cm2
MV M vel: 5.15 m/s
MV Peak grad: 106.1 mmHg
MV VTI: 2.58 cm2
S' Lateral: 3 cm

## 2021-05-07 NOTE — Telephone Encounter (Signed)
Patient's grandaughter called in wanting to discuss findings of test results for patient. Please call back   Called patient's granddaughter to review questions. Patient's granddaughter reports patient has received a letter in the mail for test results from 03/19/21. Patient's granddaughter is requesting PCP to review test results with her and if patient needs to be seen or continue further testing. 03/19/21 chest 2 view  noted, no results reviewed with granddaughter at this time. Please advise .

## 2021-05-08 DIAGNOSIS — M5416 Radiculopathy, lumbar region: Secondary | ICD-10-CM | POA: Diagnosis not present

## 2021-05-08 NOTE — Telephone Encounter (Signed)
Will forward to provider  

## 2021-05-09 NOTE — Telephone Encounter (Signed)
Phone call turned to patient's granddaughter Anna [name] this morning. Then received a letter from the ER concerning her chest x-ray report that was done on 03/19/2021.  This showed cardiomegaly with some vascular engorgement but no signs of edema, effusion or lobar consolidation.  Incidental finding was a nodular density projecting along the left heart border.  Radiologist recommended nonemergent follow-up chest CT or PA and lateral x-ray of the chest. Patient had echo done yesterday by her cardiologist Dr. Mayford Knife.  This revealed normal heart function with mild MS. I told her granddaughter that the more concerning thing is the density that was seen.  I recommend that we do a PA and lateral chest x-ray to start to see whether this is still present.  If it is then we will need to do a CAT scan of the chest. Granddaughter told me that they were seen by Dr. Ramo's/orthopedics yesterday.  He spoke with them about possibly doing a steroid injection to the spine and that they are waiting approval from her insurance.  He inquired about her blood sugars.  Patient has prediabetes and is on metformin.  She has not had an A1c this year.  I told her that she can bring the patient to the lab to have an A1c level done.  I also told her that they will need to give me or the cardiologist a heads up prior to any injections to her spine as she is on the blood thinner Xarelto that will need to be discontinued several days prior.  Granddaughter expressed understanding and all questions were answered.

## 2021-05-09 NOTE — Addendum Note (Signed)
Addended by: Jonah Blue B on: 05/09/2021 11:46 AM   Modules accepted: Orders

## 2021-05-22 DIAGNOSIS — M5410 Radiculopathy, site unspecified: Secondary | ICD-10-CM | POA: Diagnosis not present

## 2021-05-22 DIAGNOSIS — G8929 Other chronic pain: Secondary | ICD-10-CM | POA: Diagnosis not present

## 2021-05-22 DIAGNOSIS — M25561 Pain in right knee: Secondary | ICD-10-CM | POA: Diagnosis not present

## 2021-05-22 DIAGNOSIS — M25562 Pain in left knee: Secondary | ICD-10-CM | POA: Diagnosis not present

## 2021-05-22 DIAGNOSIS — Z79899 Other long term (current) drug therapy: Secondary | ICD-10-CM | POA: Diagnosis not present

## 2021-05-25 DIAGNOSIS — M5416 Radiculopathy, lumbar region: Secondary | ICD-10-CM | POA: Diagnosis not present

## 2021-05-28 DIAGNOSIS — M545 Low back pain, unspecified: Secondary | ICD-10-CM | POA: Diagnosis not present

## 2021-05-28 DIAGNOSIS — M170 Bilateral primary osteoarthritis of knee: Secondary | ICD-10-CM | POA: Diagnosis not present

## 2021-06-06 ENCOUNTER — Telehealth: Payer: Self-pay

## 2021-06-06 NOTE — Telephone Encounter (Signed)
Update on order for power chair received from [facility]: They are  awaiting quote from vendor and then I will have one additional form to fax for signature to MD..

## 2021-06-07 ENCOUNTER — Other Ambulatory Visit: Payer: Self-pay | Admitting: Internal Medicine

## 2021-06-07 DIAGNOSIS — I10 Essential (primary) hypertension: Secondary | ICD-10-CM

## 2021-06-09 ENCOUNTER — Other Ambulatory Visit: Payer: Self-pay | Admitting: Internal Medicine

## 2021-06-09 DIAGNOSIS — M170 Bilateral primary osteoarthritis of knee: Secondary | ICD-10-CM

## 2021-06-09 NOTE — Telephone Encounter (Signed)
Requested Prescriptions  Pending Prescriptions Disp Refills  . gabapentin (NEURONTIN) 100 MG capsule [Pharmacy Med Name: GABAPENTIN 100 MG CAPSULE] 270 capsule 0    Sig: TAKE 1 CAPSULE BY MOUTH THREE TIMES A DAY     Neurology: Anticonvulsants - gabapentin Passed - 06/09/2021  5:02 PM      Passed - Valid encounter within last 12 months    Recent Outpatient Visits          1 month ago Gait disturbance   [facility] Marcine Matar, MD   7 months ago Essential hypertension   [place] [facility] Marcine Matar, MD   11 months ago Need for influenza vaccination   [facility] [place], Cornelius Moras, RPH-CPP   11 months ago Primary osteoarthritis of both knees   [facility] Marcine Matar, MD   1 year ago Primary osteoarthritis of both knees   [facility] Marcine Matar, MD      Future Appointments            In 1 month Laural Benes, Binnie Rail, MD [facility]

## 2021-06-13 ENCOUNTER — Ambulatory Visit: Payer: Medicare Other | Attending: Internal Medicine

## 2021-06-13 ENCOUNTER — Other Ambulatory Visit: Payer: Self-pay

## 2021-06-13 DIAGNOSIS — R7303 Prediabetes: Secondary | ICD-10-CM | POA: Diagnosis not present

## 2021-06-14 ENCOUNTER — Telehealth: Payer: Self-pay

## 2021-06-14 LAB — HEMOGLOBIN A1C
Est. average glucose Bld gHb Est-mCnc: 120 mg/dL
Hgb A1c MFr Bld: 5.8 % — ABNORMAL HIGH (ref 4.8–5.6)

## 2021-06-14 IMAGING — US US RENAL
1 series · 14 of 25 positions shown · non-contrast
Comparison: None.

CLINICAL DATA: Stage III A chronic kidney disease.

EXAM:
RENAL / URINARY TRACT ULTRASOUND COMPLETE

[Series 1: us renal · 0.22mm/px · 14 of 35 slices shown]
[im 1/35]
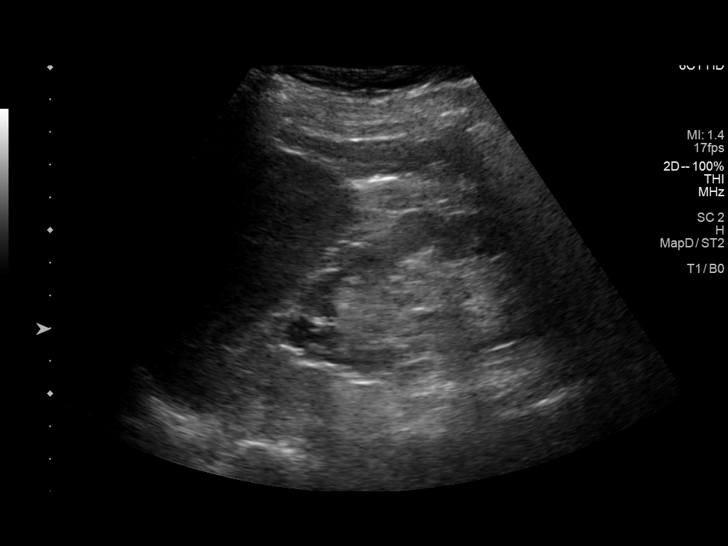
[im 3/35]
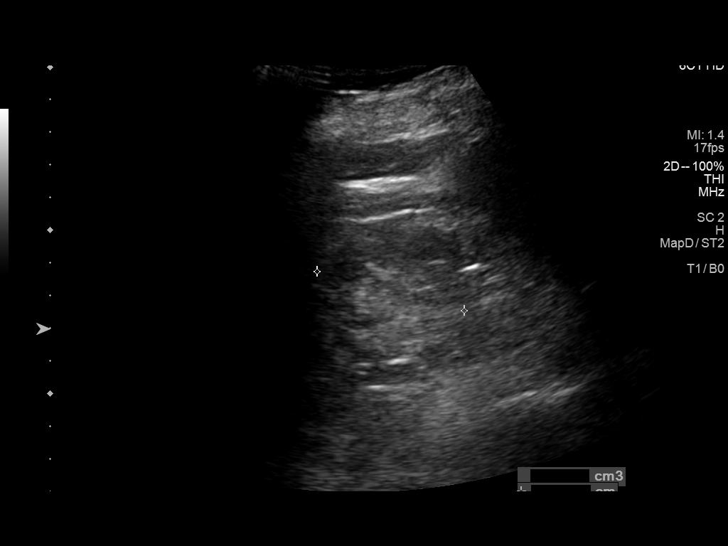
[im 6/35]
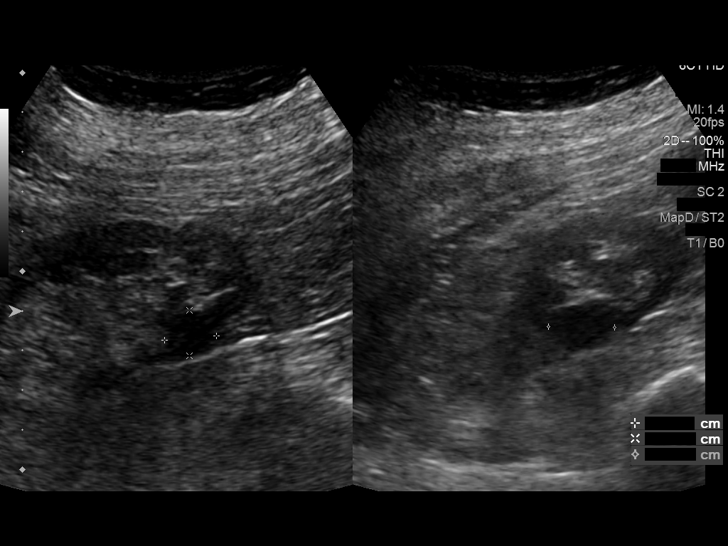
[im 9/35]
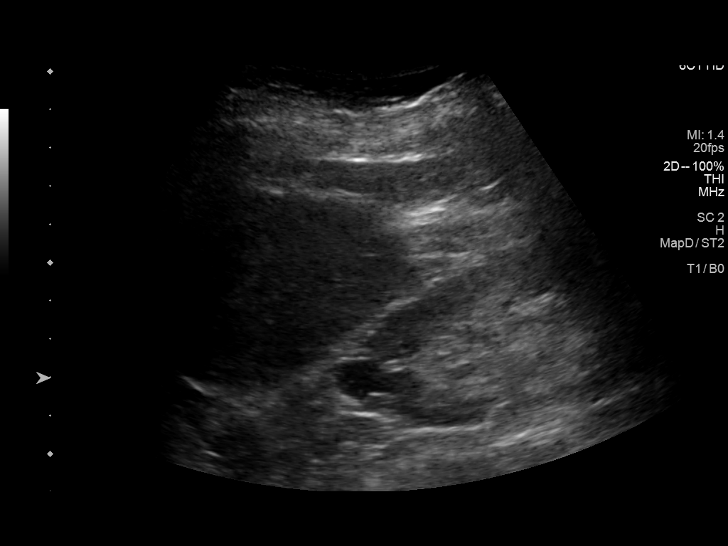
[im 12/35]
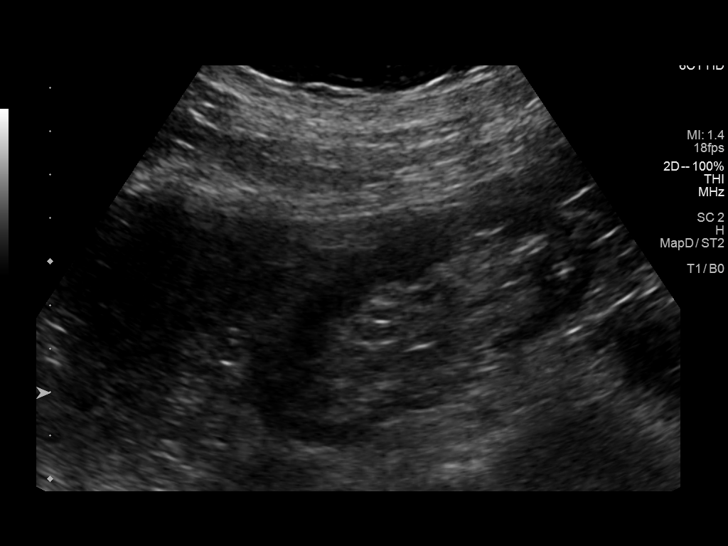
[im 13/35]
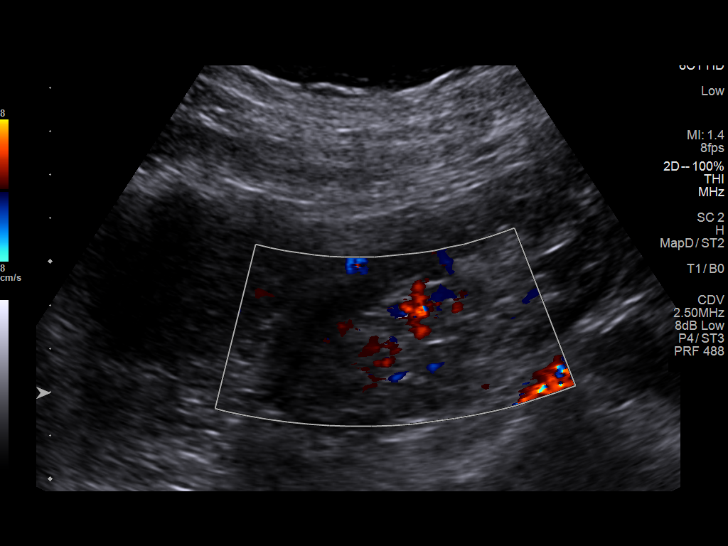
[im 16/35]
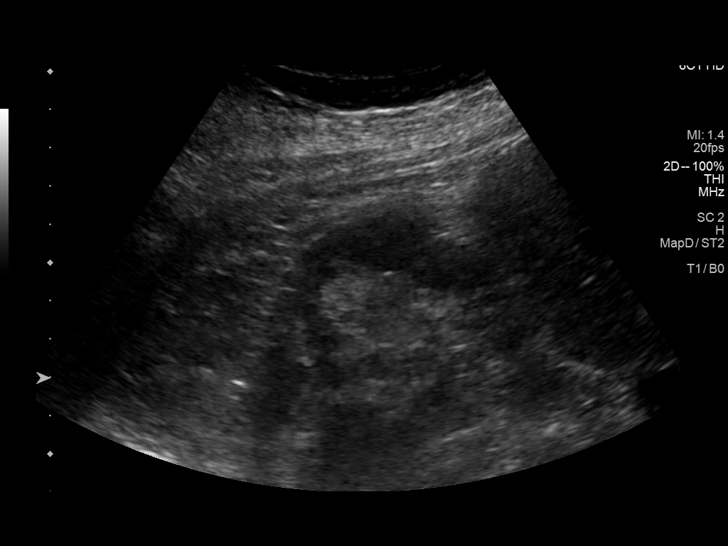
[im 19/35]
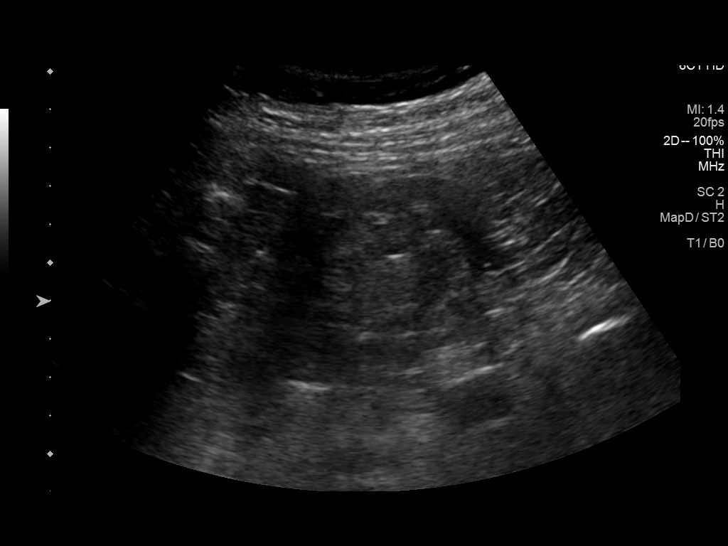
[im 22/35]
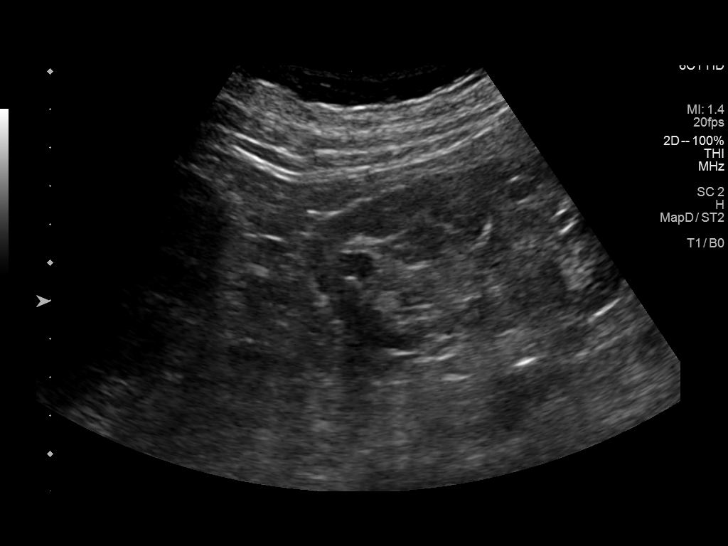
[im 23/35]
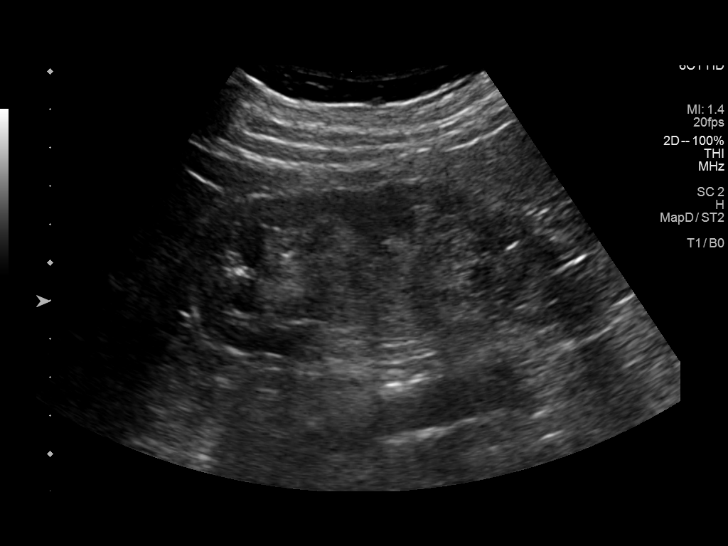
[im 26/35]
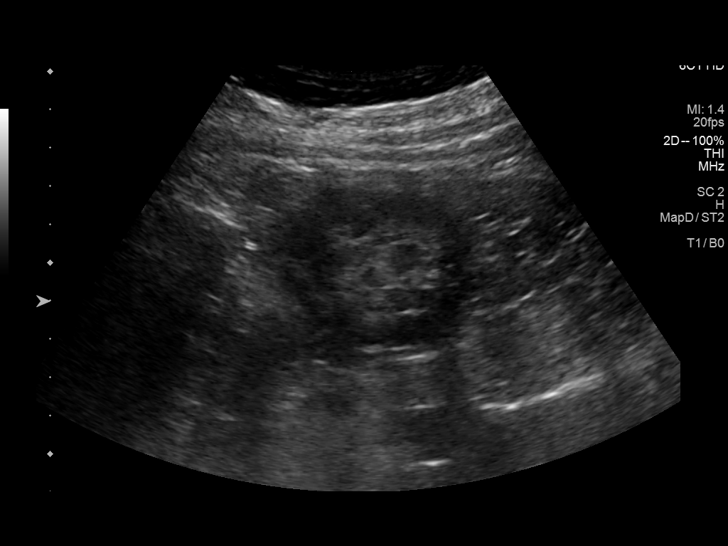
[im 29/35]
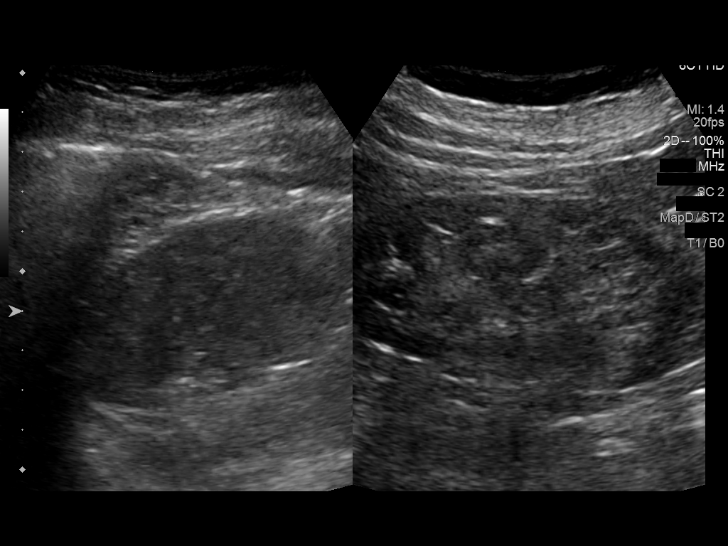
[im 32/35]
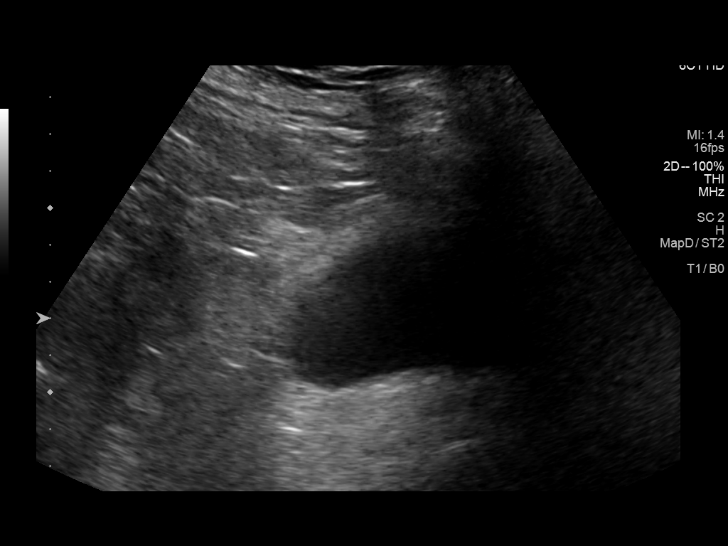
[im 35/35]
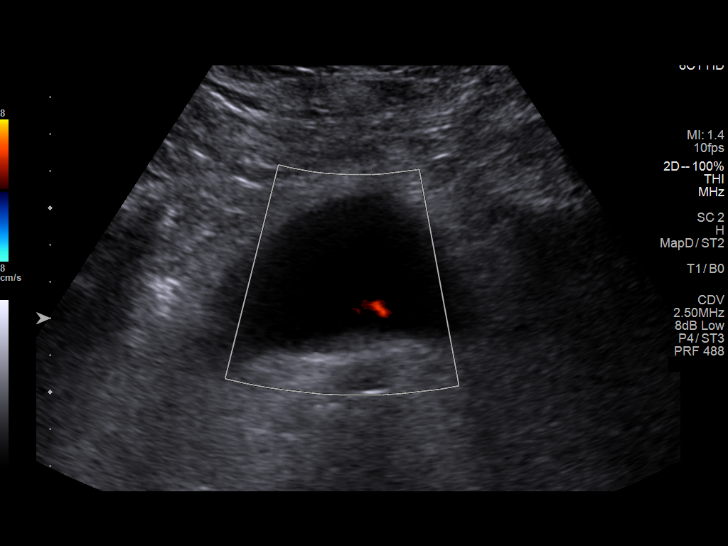

[14 of 25 positions shown; findings below may reference images not displayed]

FINDINGS: Right Kidney:

Renal measurements: 9.5 x 4.9 x 4.7 cm = volume: 113mL. Thinning of
the renal parenchyma with mild increased renal echogenicity. No
hydronephrosis. 2 cysts with largest cyst in the lower pole
measuring 1.7 cm and smaller cyst in the upper pole measuring
cm. No evidence of solid lesion. No shadowing calculi.

Left Kidney:

Renal measurements: 9.5 x 4.0 x 4.0 cm = volume: 81 mL. Thinning of
the renal parenchyma with increased renal echogenicity. No
hydronephrosis. No evidence of cystic or solid lesion. No echogenic
calculi.

Bladder:

Appears normal for degree of bladder distention. The left ureteral
jet is visualized. The right ureteral jet is not seen.

Other:

None.
IMPRESSION: 1. No hydronephrosis. Thinning of bilateral renal parenchyma with
increased echogenicity consistent with chronic medical renal
disease.
2. Simple cysts in the right kidney, largest measuring 1.9 cm.

## 2021-06-14 NOTE — Telephone Encounter (Signed)
Contacted pt to go over lab results spoke with pt granddaughter and provided results and she didn't have any questions or concerns

## 2021-06-26 DIAGNOSIS — M25561 Pain in right knee: Secondary | ICD-10-CM | POA: Diagnosis not present

## 2021-06-26 DIAGNOSIS — M25562 Pain in left knee: Secondary | ICD-10-CM | POA: Diagnosis not present

## 2021-06-26 DIAGNOSIS — M5410 Radiculopathy, site unspecified: Secondary | ICD-10-CM | POA: Diagnosis not present

## 2021-06-26 DIAGNOSIS — Z79899 Other long term (current) drug therapy: Secondary | ICD-10-CM | POA: Diagnosis not present

## 2021-06-26 DIAGNOSIS — G8929 Other chronic pain: Secondary | ICD-10-CM | POA: Diagnosis not present

## 2021-07-04 DIAGNOSIS — M5417 Radiculopathy, lumbosacral region: Secondary | ICD-10-CM | POA: Diagnosis not present

## 2021-07-13 DIAGNOSIS — M545 Low back pain, unspecified: Secondary | ICD-10-CM | POA: Diagnosis not present

## 2021-07-13 DIAGNOSIS — M170 Bilateral primary osteoarthritis of knee: Secondary | ICD-10-CM | POA: Diagnosis not present

## 2021-07-15 ENCOUNTER — Ambulatory Visit (HOSPITAL_BASED_OUTPATIENT_CLINIC_OR_DEPARTMENT_OTHER): Payer: Medicare Other

## 2021-07-15 DIAGNOSIS — Z0000 Encounter for general adult medical examination without abnormal findings: Secondary | ICD-10-CM | POA: Diagnosis not present

## 2021-07-15 NOTE — Progress Notes (Signed)
Subjective:   Anna [name] is a 85 y.o. female who presents for an Initial Medicare Annual Wellness Visit.  I connected with  Anna [name] on 07/15/21 by a audio enabled telemedicine application and verified that I am speaking with the correct person using two identifiers.   Location of patient: Home Location of provider: Office  Persons participating in visit Anna [name] (patient) [name] (granddaughter ) Erick Alley RMA   I discussed the limitations of evaluation and management by telemedicine. The patient expressed understanding and agreed to proceed.   Review of Systems    Defer to PCP       Objective:    There were no vitals filed for this visit. There is no height or weight on file to calculate BMI.  Advanced Directives 07/15/2021 04/11/2021 03/15/2021 03/17/2017 09/03/2016 06/25/2016 01/30/2016  Does Patient Have a Medical Advance Directive? Yes Yes No Yes Yes No;Yes No  Type of Advance Directive Living will - - Living will Living will Living will -  Does patient want to make changes to medical advance directive? Yes (ED - send information to MyChart) No - Patient declined - - - - -  Would patient like information on creating a medical advance directive? - - No - Patient declined - - - -    Current Medications (verified) Outpatient Encounter Medications as of 07/15/2021  Medication Sig   amlodipine-olmesartan (AZOR) 10-20 MG tablet TAKE 1 TABLET BY MOUTH EVERY DAY   cyclobenzaprine (FLEXERIL) 10 MG tablet Take 0.5 tablets (5 mg total) by mouth 2 (two) times daily as needed for muscle spasms.   diclofenac Sodium (VOLTAREN) 1 % GEL APPLY 2 GRAMS TO AFFECTED AREA 4 TIMES A DAY   docusate sodium (COLACE) 100 MG capsule Take 100 mg by mouth daily.    ferrous sulfate 325 (65 FE) MG tablet Take 325 mg by mouth daily with breakfast.   gabapentin (NEURONTIN) 100 MG capsule TAKE 1 CAPSULE BY MOUTH THREE TIMES A DAY   HYDROcodone-acetaminophen (NORCO/VICODIN) 5-325 MG tablet  Take 1 tablet by mouth every 6 (six) hours as needed for moderate pain.   metFORMIN (GLUCOPHAGE) 500 MG tablet TAKE 1 TABLET BY MOUTH EVERY DAY WITH BREAKFAST   Misc. Devices Event organiser. Devices MISC Rollator walker   Misc. Devices MISC Hospital bed   Omega-3 Fatty Acids (FISH OIL CONCENTRATE PO) Take 1 capsule by mouth daily.    rivaroxaban (XARELTO) 20 MG TABS tablet Take 1 tablet (20 mg total) by mouth daily with supper. Due for follow-up, MUST schedule follow-up MD appt for FUTURE refills.   rosuvastatin (CRESTOR) 10 MG tablet TAKE 1 TABLET BY MOUTH EVERY DAY   [DISCONTINUED] Multiple Vitamins-Minerals (CENTRUM SILVER PO) Take 1 tablet by mouth daily.    [DISCONTINUED] methylPREDNISolone (MEDROL DOSEPAK) 4 MG TBPK tablet Follow package insert (Patient not taking: No sig reported)   No facility-administered encounter medications on file as of 07/15/2021.    Allergies (verified) Patient has no known allergies.   History: Past Medical History:  Diagnosis Date   Hyperlipidemia    Hypertension    Mitral stenosis    mild by echo 04/2021   Osteoarthritis    Overweight(278.02)    Permanent atrial fibrillation [facility])    Past Surgical History:  Procedure Laterality Date   CARDIOVERSION N/A 03/24/2014   Procedure: CARDIOVERSION;  Surgeon: Quintella Reichert, MD;  Location: [facility];  Service: Cardiovascular;  Laterality: N/A;   Family History  Problem Relation  Age of Onset   Alzheimer's disease Mother    Social History   Socioeconomic History   Marital status: Divorced    Spouse name: Not on file   Number of children: Not on file   Years of education: Not on file   Highest education level: Not on file  Occupational History   Not on file  Tobacco Use   Smoking status: Never   Smokeless tobacco: Never  Substance and Sexual Activity   Alcohol use: No   Drug use: No   Sexual activity: Not on file  Other Topics Concern   Not on file  Social History  Narrative   Not on file   Social Determinants of Health   Financial Resource Strain: Low Risk    Difficulty of Paying Living Expenses: Not hard at all  Food Insecurity: Not on file  Transportation Needs: No Transportation Needs   Lack of Transportation (Medical): No   Lack of Transportation (Non-Medical): No  Physical Activity: Insufficiently Active   Days of Exercise per Week: 5 days   Minutes of Exercise per Session: 10 min  Stress: Not on file  Social Connections: Not on file    Tobacco Counseling Counseling given: Not Answered   Clinical Intake:     Pain : No/denies pain     Nutritional Risks: None Diabetes: No     Diabetic?No         Activities of Daily Living In your present state of health, do you have any difficulty performing the following activities: 07/15/2021 03/19/2021  Hearing? N N  Vision? N N  Difficulty concentrating or making decisions? N N  Walking or climbing stairs? Y N  Dressing or bathing? Y N  Doing errands, shopping? Y N  Some recent data might be hidden    Patient Care Team: Marcine Matar, MD as PCP - General (Internal Medicine) Quintella Reichert, MD as PCP - Cardiology (Cardiology)  Indicate any recent Medical Services you may have received from other than [facility] providers in the past year (date may be approximate).     Assessment:   This is a routine wellness examination for [name].  Hearing/Vision screen No results found.  Dietary issues and exercise activities discussed:     Goals Addressed   None   Depression Screen PHQ 2/9 Scores 07/15/2021 03/19/2021 11/06/2020 08/13/2019 10/09/2018 07/10/2018 03/09/2018  PHQ - 2 Score 0 0 0 0 2 2 1   PHQ- 9 Score 0 0 - - 7 9 -    Fall Risk Fall Risk  07/15/2021 03/19/2021 07/07/2020 08/13/2019 07/10/2018  Falls in the past year? 1 1 0 0 Yes  Number falls in past yr: 1 1 0 - 1  Comment 07/03/21 - - - -  Injury with Fall? 1 0 0 - Yes  Comment Bruise Left side leg - - - -  Risk  Factor Category  - - - - -  Risk for fall due to : - Orthopedic patient - - Impaired balance/gait  Follow up Falls evaluation completed Falls evaluation completed - - Education provided    FALL RISK PREVENTION PERTAINING TO THE HOME:  Any stairs in or around the home? No  If so, are there any without handrails? No  Home free of loose throw rugs in walkways, pet beds, electrical cords, etc? Yes  Adequate lighting in your home to reduce risk of falls? Yes   ASSISTIVE DEVICES UTILIZED TO PREVENT FALLS:  Life alert? Yes  Use of a cane,  walker or w/c? Yes  Grab bars in the bathroom? Yes  Shower chair or bench in shower? Yes  Elevated toilet seat or a handicapped toilet? Yes   TIMED UP AND GO:  Was the test performed?  N/A .  Length of time to ambulate 10 feet: N/A sec.     Cognitive Function:     6CIT Screen 07/15/2021  What Year? 0 points  What month? 0 points  What time? 0 points  Count back from 20 0 points  Months in reverse 2 points  Repeat phrase 0 points  Total Score 2    Immunizations Immunization History  Administered Date(s) Administered   Influenza, High Dose Seasonal PF 07/10/2018, 07/19/2019   Influenza,inj,Quad PF,6+ Mos 06/25/2016, 09/16/2017, 07/07/2020   Janssen (J&J) SARS-COV-2 Vaccination 02/07/2020   PFIZER(Purple Top)SARS-COV-2 Vaccination 03/15/2021   Pneumococcal Conjugate-13 09/16/2017   Tdap 03/17/2017    TDAP status: Up to date  Flu Vaccine status: Due, Education has been provided regarding the importance of this vaccine. Advised may receive this vaccine at local pharmacy or Health Dept. Aware to provide a copy of the vaccination record if obtained from local pharmacy or Health Dept. Verbalized acceptance and understanding.  Pneumococcal vaccine status: Due, Education has been provided regarding the importance of this vaccine. Advised may receive this vaccine at local pharmacy or Health Dept. Aware to provide a copy of the vaccination record  if obtained from local pharmacy or Health Dept. Verbalized acceptance and understanding. Due Pneumococcal 23  Covid-19 vaccine status: Completed vaccines  Qualifies for Shingles Vaccine? Yes   Zostavax completed Yes   Shingrix Completed?: Yes  Screening Tests Health Maintenance  Topic Date Due   Zoster Vaccines- Shingrix (1 of 2) Never done   DEXA SCAN  Never done   INFLUENZA VACCINE  05/28/2021   COVID-19 Vaccine (3 - Booster for Janssen series) 07/31/2021 (Originally 05/10/2021)   TETANUS/TDAP  03/18/2027   HPV VACCINES  Aged Out    Health Maintenance  Health Maintenance Due  Topic Date Due   Zoster Vaccines- Shingrix (1 of 2) Never done   DEXA SCAN  Never done   INFLUENZA VACCINE  05/28/2021    Colorectal cancer screening: No longer required.       Lung Cancer Screening: (Low Dose CT Chest recommended if Age 40-80 years, 30 pack-year currently smoking OR have quit w/in 15years.) does not qualify.   Lung Cancer Screening Referral:   Additional Screening:  Hepatitis C Screening: does not qualify; Completed N/A  Vision Screening: Recommended annual ophthalmology exams for early detection of glaucoma and other disorders of the eye. Is the patient up to date with their annual eye exam?  Yes  Who is the provider or what is the name of the office in which the patient attends annual eye exams? Dr.Gorth If pt is not established with a provider, would they like to be referred to a provider to establish care? No .   Dental Screening: Recommended annual dental exams for proper oral hygiene  Community Resource Referral / Chronic Care Management: CRR required this visit?  No   CCM required this visit?  No      Plan:     I have personally reviewed and noted the following in the patient's chart:   Medical and social history Use of alcohol, tobacco or illicit drugs  Current medications and supplements including opioid prescriptions. Patient is currently taking opioid  prescriptions. Information provided to patient regarding non-opioid alternatives. Patient advised to discuss  non-opioid treatment plan with their provider. Functional ability and status Nutritional status Physical activity Advanced directives List of other physicians Hospitalizations, surgeries, and ER visits in previous 12 months Vitals Screenings to include cognitive, depression, and falls Referrals and appointments  In addition, I have reviewed and discussed with patient certain preventive protocols, quality metrics, and best practice recommendations. A written personalized care plan for preventive services as well as general preventive health recommendations were provided to patient.     Erick Alley, [facility]   07/15/2021   Nurse Notes: Non Face to Face 40 minutes  Anna [name] , Thank you for taking time to come for your Medicare Wellness Visit. I appreciate your ongoing commitment to your health goals. Please review the following plan we discussed and let me know if I can assist you in the future.   These are the goals we discussed:  Goals   None     This is a list of the screening recommended for you and due dates:  Health Maintenance  Topic Date Due   Zoster (Shingles) Vaccine (1 of 2) Never done   DEXA scan (bone density measurement)  Never done   Flu Shot  05/28/2021   COVID-19 Vaccine (3 - Booster for Janssen series) 07/31/2021*   Tetanus Vaccine  03/18/2027   HPV Vaccine  Aged Out  *Topic was postponed. The date shown is not the original due date.

## 2021-07-15 NOTE — Patient Instructions (Signed)
Health Maintenance, Female Adopting a healthy lifestyle and getting preventive care are important in promoting health and wellness. Ask your health care provider about: The right schedule for you to have regular tests and exams. Things you can do on your own to prevent diseases and keep yourself healthy. What should I know about diet, weight, and exercise? Eat a healthy diet  Eat a diet that includes plenty of vegetables, fruits, low-fat dairy products, and lean protein. Do not eat a lot of foods that are high in solid fats, added sugars, or sodium. Maintain a healthy weight Body mass index (BMI) is used to identify weight problems. It estimates body fat based on height and weight. Your health care provider can help determine your BMI and help you achieve or maintain a healthy weight. Get regular exercise Get regular exercise. This is one of the most important things you can do for your health. Most adults should: Exercise for at least 150 minutes each week. The exercise should increase your heart rate and make you sweat (moderate-intensity exercise). Do strengthening exercises at least twice a week. This is in addition to the moderate-intensity exercise. Spend less time sitting. Even light physical activity can be beneficial. Watch cholesterol and blood lipids Have your blood tested for lipids and cholesterol at 83 years of age, then have this test every 5 years. Have your cholesterol levels checked more often if: Your lipid or cholesterol levels are high. You are older than 83 years of age. You are at high risk for heart disease. What should I know about cancer screening? Depending on your health history and family history, you may need to have cancer screening at various ages. This may include screening for: Breast cancer. Cervical cancer. Colorectal cancer. Skin cancer. Lung cancer. What should I know about heart disease, diabetes, and high blood pressure? Blood pressure and heart  disease High blood pressure causes heart disease and increases the risk of stroke. This is more likely to develop in people who have high blood pressure readings, are of African descent, or are overweight. Have your blood pressure checked: Every 3-5 years if you are 18-39 years of age. Every year if you are 40 years old or older. Diabetes Have regular diabetes screenings. This checks your fasting blood sugar level. Have the screening done: Once every three years after age 40 if you are at a normal weight and have a low risk for diabetes. More often and at a younger age if you are overweight or have a high risk for diabetes. What should I know about preventing infection? Hepatitis B If you have a higher risk for hepatitis B, you should be screened for this virus. Talk with your health care provider to find out if you are at risk for hepatitis B infection. Hepatitis C Testing is recommended for: Everyone born from 1945 through 1965. Anyone with known risk factors for hepatitis C. Sexually transmitted infections (STIs) Get screened for STIs, including gonorrhea and chlamydia, if: You are sexually active and are younger than 83 years of age. You are older than 83 years of age and your health care provider tells you that you are at risk for this type of infection. Your sexual activity has changed since you were last screened, and you are at increased risk for chlamydia or gonorrhea. Ask your health care provider if you are at risk. Ask your health care provider about whether you are at high risk for HIV. Your health care provider may recommend a prescription medicine   to help prevent HIV infection. If you choose to take medicine to prevent HIV, you should first get tested for HIV. You should then be tested every 3 months for as long as you are taking the medicine. Pregnancy If you are about to stop having your period (premenopausal) and you may become pregnant, seek counseling before you get  pregnant. Take 400 to 800 micrograms (mcg) of folic acid every day if you become pregnant. Ask for birth control (contraception) if you want to prevent pregnancy. Osteoporosis and menopause Osteoporosis is a disease in which the bones lose minerals and strength with aging. This can result in bone fractures. If you are 65 years old or older, or if you are at risk for osteoporosis and fractures, ask your health care provider if you should: Be screened for bone loss. Take a calcium or vitamin D supplement to lower your risk of fractures. Be given hormone replacement therapy (HRT) to treat symptoms of menopause. Follow these instructions at home: Lifestyle Do not use any products that contain nicotine or tobacco, such as cigarettes, e-cigarettes, and chewing tobacco. If you need help quitting, ask your health care provider. Do not use street drugs. Do not share needles. Ask your health care provider for help if you need support or information about quitting drugs. Alcohol use Do not drink alcohol if: Your health care provider tells you not to drink. You are pregnant, may be pregnant, or are planning to become pregnant. If you drink alcohol: Limit how much you use to 0-1 drink a day. Limit intake if you are breastfeeding. Be aware of how much alcohol is in your drink. In the [place] one drink equals one 12 oz bottle of beer (355 mL), one 5 oz glass of wine (148 mL), or one 1 oz glass of hard liquor (44 mL). General instructions Schedule regular health, dental, and eye exams. Stay current with your vaccines. Tell your health care provider if: You often feel depressed. You have ever been abused or do not feel safe at home. Summary Adopting a healthy lifestyle and getting preventive care are important in promoting health and wellness. Follow your health care provider's instructions about healthy diet, exercising, and getting tested or screened for diseases. Follow your health care provider's  instructions on monitoring your cholesterol and blood pressure. This information is not intended to replace advice given to you by your health care provider. Make sure you discuss any questions you have with your health care provider. Document Revised: 12/22/2020 Document Reviewed: 10/07/2018 Elsevier Patient Education  2022 [employer]  

## 2021-07-19 ENCOUNTER — Ambulatory Visit (HOSPITAL_COMMUNITY)
Admission: RE | Admit: 2021-07-19 | Discharge: 2021-07-19 | Disposition: A | Discharge status: Home / Self Care | Payer: Medicare Other | Source: Ambulatory Visit | Attending: Internal Medicine | Admitting: Internal Medicine

## 2021-07-19 ENCOUNTER — Other Ambulatory Visit: Payer: Self-pay

## 2021-07-19 ENCOUNTER — Other Ambulatory Visit: Payer: Self-pay | Admitting: Internal Medicine

## 2021-07-19 DIAGNOSIS — R9389 Abnormal findings on diagnostic imaging of other specified body structures: Secondary | ICD-10-CM | POA: Insufficient documentation

## 2021-07-19 DIAGNOSIS — R918 Other nonspecific abnormal finding of lung field: Secondary | ICD-10-CM | POA: Diagnosis not present

## 2021-07-19 DIAGNOSIS — R7303 Prediabetes: Secondary | ICD-10-CM

## 2021-07-19 NOTE — Telephone Encounter (Signed)
Requested medications are due for refill today. yes  Requested medications are on the active medications list.  yes  Last refill. 03/15/2021 #90 /0  Future visit scheduled.   yes  Notes to clinic.  Per note of 11/06/2020 pt should take 1/2 of pill.

## 2021-07-20 ENCOUNTER — Other Ambulatory Visit: Payer: Self-pay

## 2021-07-20 ENCOUNTER — Other Ambulatory Visit (HOSPITAL_BASED_OUTPATIENT_CLINIC_OR_DEPARTMENT_OTHER): Payer: Self-pay

## 2021-07-20 ENCOUNTER — Ambulatory Visit: Payer: Medicare Other | Attending: Internal Medicine | Admitting: Internal Medicine

## 2021-07-20 ENCOUNTER — Encounter: Payer: Self-pay | Admitting: Internal Medicine

## 2021-07-20 VITALS — BP 134/76 | HR 61 | Resp 16 | Wt 176.6 lb

## 2021-07-20 DIAGNOSIS — R9389 Abnormal findings on diagnostic imaging of other specified body structures: Secondary | ICD-10-CM

## 2021-07-20 DIAGNOSIS — R7303 Prediabetes: Secondary | ICD-10-CM | POA: Diagnosis not present

## 2021-07-20 DIAGNOSIS — M170 Bilateral primary osteoarthritis of knee: Secondary | ICD-10-CM

## 2021-07-20 DIAGNOSIS — R269 Unspecified abnormalities of gait and mobility: Secondary | ICD-10-CM | POA: Diagnosis not present

## 2021-07-20 DIAGNOSIS — I10 Essential (primary) hypertension: Secondary | ICD-10-CM

## 2021-07-20 DIAGNOSIS — I4892 Unspecified atrial flutter: Secondary | ICD-10-CM

## 2021-07-20 DIAGNOSIS — Z23 Encounter for immunization: Secondary | ICD-10-CM

## 2021-07-20 MED ORDER — DICLOFENAC SODIUM 1 % EX GEL
CUTANEOUS | 3 refills | Status: DC
  Filled 2021-07-20: qty 400, fill #0
  Filled 2021-07-23: qty 400, 50d supply, fill #0

## 2021-07-20 MED ORDER — DICLOFENAC SODIUM 1 % EX GEL
CUTANEOUS | 3 refills | Status: DC
  Filled 2021-07-20: qty 400, 20d supply, fill #0
  Filled 2021-07-20: qty 400, 25d supply, fill #0

## 2021-07-20 NOTE — Progress Notes (Signed)
Patient ID: Anna [name], female    DOB: 05/20/1936  MRN: [id number]  CC: Hypertension and Prediabetes   Subjective: Anna [name] is a 85 y.o. female who presents for chronic ds management.  Her granddaughter Anna [name] is with her. Her concerns today include:  history of atrial flutter on Xarelto with history of cardioversion 2015, HTN, trivial AR, mild MR,OA knees, HL, prediabetes, CKD 3, ACD, lumbar radiculopathy, hx of vertebral fx.  HTN: She checks her blood pressure every other day.  She reports that her readings have been good.  She had systolic blood pressure few times in the 140's but most of the times it is lower than that.  She denies any dizziness, chest pains or shortness of breath.  No palpitations.  She has not had any bruising or bleeding on her blood thinner.  Prediabetes/obesity: She continues to take metformin half a tablet daily.  Recent A1c was 5.8.  She is doing okay with her eating habits.  She had repeat chest x-ray done yesterday to take a closer look at the nodular density seen projecting along the left heart border on chest x-ray done back in May.  Recent x-ray shows that that finding no longer persists.  Gait: She is awaiting her mobility chair from [employer].  Anna [name] tells me that they called her earlier this month to inform her that her insurance approved it.  They also recently received a letter from the caps program informing her that she has been approved based on the preliminary information but there were 2 additional steps that need to be taken.  Anna [name] had some additional questions about the And how it may affect their ability to get an aide to come in in the evenings to assist the patient.  She reports that her lower back feels much better since she had injections by Dr. Horald Chestnut.  She continues to see Dr. Joselyn Glassman at [facility] for pain management.  She is on Flexeril and Vicodin.  She reports mild drowsiness sometimes with the medications.  She has  not had any falls.  She uses her walker for short distances I getting down her hall.  Requests refill on diclofenac's gel which she finds helpful to rub on her knees. Patient Active Problem List   Diagnosis Date Noted   Mitral stenosis 05/07/2021   Recurrent falls 04/24/2021   History of vertebral fracture 04/24/2021   Lumbar radiculopathy 04/24/2021   Anemia due to stage 3a chronic kidney disease (HCC) 03/07/2020   Stage 3a chronic kidney disease (HCC) 03/07/2020   Mixed incontinence urge and stress 03/09/2018   Prediabetes 03/09/2018   OA (osteoarthritis) of knee 03/09/2018   Sinus bradycardia 12/18/2016   Estrogen deficiency 07/03/2016   Mitral regurgitation 03/01/2014   Atrial flutter (HCC) 03/01/2014   Permanent atrial fibrillation (HCC) 12/24/2013   Essential hypertension, benign 12/24/2013   Heart murmur 12/24/2013   Hyperlipemia 12/24/2013     Current Outpatient Medications on File Prior to Visit  Medication Sig Dispense Refill   amlodipine-olmesartan (AZOR) 10-20 MG tablet TAKE 1 TABLET BY MOUTH EVERY DAY 90 tablet 0   cyclobenzaprine (FLEXERIL) 10 MG tablet Take 0.5 tablets (5 mg total) by mouth 2 (two) times daily as needed for muscle spasms. 20 tablet 0   docusate sodium (COLACE) 100 MG capsule Take 100 mg by mouth daily.      ferrous sulfate 325 (65 FE) MG tablet Take 325 mg by mouth daily with breakfast.     gabapentin (  NEURONTIN) 100 MG capsule TAKE 1 CAPSULE BY MOUTH THREE TIMES A DAY 270 capsule 0   HYDROcodone-acetaminophen (NORCO/VICODIN) 5-325 MG tablet Take 1 tablet by mouth every 6 (six) hours as needed for moderate pain.     metFORMIN (GLUCOPHAGE) 500 MG tablet TAKE 1 TABLET BY MOUTH EVERY DAY WITH BREAKFAST 90 tablet 0   Misc. Devices Paramedic. Devices MISC Rollator walker 1 Device 0   Misc. Devices MISC Hospital bed 1 Device 0   Omega-3 Fatty Acids (FISH OIL CONCENTRATE PO) Take 1 capsule by mouth daily.      rivaroxaban  (XARELTO) 20 MG TABS tablet Take 1 tablet (20 mg total) by mouth daily with supper. Due for follow-up, MUST schedule follow-up MD appt for FUTURE refills. 90 tablet 0   rosuvastatin (CRESTOR) 10 MG tablet TAKE 1 TABLET BY MOUTH EVERY DAY 90 tablet 3   No current facility-administered medications on file prior to visit.    No Known Allergies  Social History   Socioeconomic History   Marital status: Divorced    Spouse name: Not on file   Number of children: Not on file   Years of education: Not on file   Highest education level: Not on file  Occupational History   Not on file  Tobacco Use   Smoking status: Never   Smokeless tobacco: Never  Substance and Sexual Activity   Alcohol use: No   Drug use: No   Sexual activity: Not on file  Other Topics Concern   Not on file  Social History Narrative   Not on file   Social Determinants of Health   Financial Resource Strain: Low Risk    Difficulty of Paying Living Expenses: Not hard at all  Food Insecurity: Not on file  Transportation Needs: No Transportation Needs   Lack of Transportation (Medical): No   Lack of Transportation (Non-Medical): No  Physical Activity: Insufficiently Active   Days of Exercise per Week: 5 days   Minutes of Exercise per Session: 10 min  Stress: Not on file  Social Connections: Not on file  Intimate Partner Violence: Not on file    Family History  Problem Relation Age of Onset   Alzheimer's disease Mother     Past Surgical History:  Procedure Laterality Date   CARDIOVERSION N/A 03/24/2014   Procedure: CARDIOVERSION;  Surgeon: Quintella Reichert, MD;  Location: [facility];  Service: Cardiovascular;  Laterality: N/A;    ROS: Review of Systems Negative except as stated above  PHYSICAL EXAM: BP 134/76   Pulse 61   Resp 16   Wt 176 lb 9.6 oz (80.1 kg)   SpO2 97%   BMI 31.29 kg/m   Physical Exam  General appearance - alert, well appearing, pleasant elderly Caucasian female and in no  distress.  She appears well kept.  She answers questions appropriately. Mental status - normal mood, behavior, speech, dress, motor activity, and thought processes Neck - supple, no significant adenopathy Chest - clear to auscultation, no wheezes, rales or rhonchi, symmetric air entry Heart -irregularly irregular but rate controlled Musculoskeletal -patient is in a wheelchair.  Mild discomfort with passive range of motion of the knees Extremities -no lower extremity edema.  CMP Latest Ref Rng & Units 03/19/2021 07/14/2020 07/07/2020  Glucose 70 - 99 mg/dL 867(Y) - 195(K)  BUN 8 - 23 mg/dL 22 - 16  Creatinine 9.32 - 1.00 mg/dL 6.71 - 2.45(Y)  Sodium 135 - 145 mmol/L 137 -  142  Potassium 3.5 - 5.1 mmol/L 4.2 5.0 5.3(H)  Chloride 98 - 111 mmol/L 103 - 103  CO2 22 - 32 mmol/L 25 - 26  Calcium 8.9 - 10.3 mg/dL 9.9 - 01.7  Total Protein 6.0 - 8.5 g/dL - - 7.5  Total Bilirubin 0.0 - 1.2 mg/dL - - 0.5  Alkaline Phos 48 - 121 IU/L - - 84  AST 0 - 40 IU/L - - 23  ALT 0 - 32 IU/L - - 10   Lipid Panel     Component Value Date/Time   CHOL 108 07/07/2020 1421   TRIG 67 07/07/2020 1421   HDL 47 07/07/2020 1421   CHOLHDL 2.3 07/07/2020 1421   CHOLHDL 1.9 06/29/2015 0945   VLDL 14 06/29/2015 0945   LDLCALC 47 07/07/2020 1421    CBC    Component Value Date/Time   WBC 7.8 03/19/2021 1409   RBC 4.28 03/19/2021 1409   HGB 12.3 03/19/2021 1409   HGB 11.1 07/07/2020 1421   HCT 38.2 03/19/2021 1409   HCT 35.1 07/07/2020 1421   PLT 248 03/19/2021 1409   PLT 243 07/07/2020 1421   MCV 89.3 03/19/2021 1409   MCV 88 07/07/2020 1421   MCH 28.7 03/19/2021 1409   MCHC 32.2 03/19/2021 1409   RDW 14.6 03/19/2021 1409   RDW 13.3 07/07/2020 1421   LYMPHSABS 1.9 12/18/2016 1452   MONOABS 378 06/19/2016 1503   EOSABS 0.2 12/18/2016 1452   BASOSABS 0.1 12/18/2016 1452    ASSESSMENT AND PLAN: 1. Essential hypertension Blood pressure is good.  Continue Azor and low-salt diet  2. Primary  osteoarthritis of both knees She avoids her mobility chair.  It has been approved by her insurance. - diclofenac Sodium (VOLTAREN) 1 % GEL; Apply to knees 4 times a day as needed.  Dispense: 400 g; Refill: 3  3. Gait disturbance See #2 above.  4. Prediabetes Continue metformin.  5. Atrial flutter, unspecified type (HCC) Continue Xarelto.  She is rate controlled.  6. Need for immunization against influenza - Flu Vaccine QUAD 78mo+IM (Fluarix, Fluzone & Alfiuria Quad PF)  7. Abnormal CXR Abnormality previously seen no longer persists.   Patient was given the opportunity to ask questions.  Patient verbalized understanding of the plan and was able to repeat key elements of the plan.   Orders Placed This Encounter  Procedures   Flu Vaccine QUAD 8mo+IM (Fluarix, Fluzone & Alfiuria Quad PF)     Requested Prescriptions   Signed Prescriptions Disp Refills   diclofenac Sodium (VOLTAREN) 1 % GEL 400 g 3    Sig: Apply to knees 4 times a day as needed.    Return in about 4 months (around 11/19/2021).  Jonah Blue, MD, FACP

## 2021-07-23 ENCOUNTER — Other Ambulatory Visit: Payer: Self-pay

## 2021-07-24 ENCOUNTER — Other Ambulatory Visit (HOSPITAL_COMMUNITY): Payer: Self-pay

## 2021-08-01 ENCOUNTER — Other Ambulatory Visit: Payer: Self-pay | Admitting: Cardiology

## 2021-08-01 DIAGNOSIS — Z0000 Encounter for general adult medical examination without abnormal findings: Secondary | ICD-10-CM | POA: Diagnosis not present

## 2021-08-01 DIAGNOSIS — M25561 Pain in right knee: Secondary | ICD-10-CM | POA: Diagnosis not present

## 2021-08-01 DIAGNOSIS — Z79899 Other long term (current) drug therapy: Secondary | ICD-10-CM | POA: Diagnosis not present

## 2021-08-01 DIAGNOSIS — I4892 Unspecified atrial flutter: Secondary | ICD-10-CM

## 2021-08-01 DIAGNOSIS — E7800 Pure hypercholesterolemia, unspecified: Secondary | ICD-10-CM | POA: Diagnosis not present

## 2021-08-01 DIAGNOSIS — Z780 Asymptomatic menopausal state: Secondary | ICD-10-CM | POA: Diagnosis not present

## 2021-08-01 DIAGNOSIS — M129 Arthropathy, unspecified: Secondary | ICD-10-CM | POA: Diagnosis not present

## 2021-08-01 NOTE — Telephone Encounter (Addendum)
Prescription refill request for Xarelto received.  Indication: Afib  Last office visit: 03/20/20 - Pt has scheduled appt with Dr Mayford Knife on 12/24/21. Weight: 80.1kg Age: 85 Scr: 0.97 (03/19/21) CrCl: 55.32ml/min   Appropriate dose and refill sent to requested pharmacy.

## 2021-08-02 ENCOUNTER — Telehealth: Payer: Self-pay | Admitting: Cardiology

## 2021-08-02 NOTE — Telephone Encounter (Signed)
*  STAT* If patient is at the pharmacy, call can be transferred to refill team.   1. Which medications need to be refilled? (please list name of each medication and dose if known) rivaroxaban (XARELTO) 20 MG TABS tablet  2. Which pharmacy/location (including street and city if local pharmacy) is medication to be sent to?  [id number] [place], [place] - [address]   3. Do they need a 30 day or 90 day supply? 90ds

## 2021-08-02 NOTE — Telephone Encounter (Signed)
Prescription sent to requested pharmacy. 

## 2021-08-12 DIAGNOSIS — M545 Low back pain, unspecified: Secondary | ICD-10-CM | POA: Diagnosis not present

## 2021-08-12 DIAGNOSIS — M170 Bilateral primary osteoarthritis of knee: Secondary | ICD-10-CM | POA: Diagnosis not present

## 2021-09-03 ENCOUNTER — Other Ambulatory Visit: Payer: Self-pay | Admitting: Internal Medicine

## 2021-09-03 DIAGNOSIS — I10 Essential (primary) hypertension: Secondary | ICD-10-CM

## 2021-09-03 NOTE — Telephone Encounter (Signed)
Future OV 11/19/21. Approved per protocol.  Requested Prescriptions  Pending Prescriptions Disp Refills  . amlodipine-olmesartan (AZOR) 10-20 MG tablet [Pharmacy Med Name: AMLODIPINE-OLMESARTAN 10-20 MG] 90 tablet 0    Sig: TAKE 1 TABLET BY MOUTH EVERY DAY     Cardiovascular: CCB + ARB Combos Passed - 09/03/2021  1:37 AM      Passed - K in normal range and within 180 days    Potassium  Date Value Ref Range Status  03/19/2021 4.2 3.5 - 5.1 mmol/L Final         Passed - Cr in normal range and within 180 days    Creat  Date Value Ref Range Status  06/19/2016 1.04 (H) 0.60 - 0.93 mg/dL Final    Comment:      For patients > or = 83 years of age: The upper reference limit for Creatinine is approximately 13% higher for people identified as African-American.      Creatinine, Ser  Date Value Ref Range Status  03/19/2021 0.97 0.44 - 1.00 mg/dL Final         Passed - Patient is not pregnant      Passed - Last BP in normal range    BP Readings from Last 1 Encounters:  07/20/21 134/76         Passed - Valid encounter within last 6 months    Recent Outpatient Visits          1 month ago Essential hypertension   [facility] Marcine Matar, MD   4 months ago Gait disturbance   [facility] Marcine Matar, MD   10 months ago Essential hypertension   [place] [facility] Marcine Matar, MD   1 year ago Need for influenza vaccination   [facility] Lois Huxley, Cornelius Moras, RPH-CPP   1 year ago Primary osteoarthritis of both knees   [facility] Marcine Matar, MD      Future Appointments            In 2 months Laural Benes Binnie Rail, MD [facility]   In 3 months Turner, Cornelious Bryant, MD [facility]

## 2021-09-04 ENCOUNTER — Other Ambulatory Visit: Payer: Self-pay | Admitting: Internal Medicine

## 2021-09-04 DIAGNOSIS — M170 Bilateral primary osteoarthritis of knee: Secondary | ICD-10-CM

## 2021-09-05 DIAGNOSIS — M170 Bilateral primary osteoarthritis of knee: Secondary | ICD-10-CM | POA: Diagnosis not present

## 2021-09-05 DIAGNOSIS — N1831 Chronic kidney disease, stage 3a: Secondary | ICD-10-CM | POA: Diagnosis not present

## 2021-09-05 DIAGNOSIS — M5136 Other intervertebral disc degeneration, lumbar region: Secondary | ICD-10-CM | POA: Diagnosis not present

## 2021-09-05 DIAGNOSIS — R2689 Other abnormalities of gait and mobility: Secondary | ICD-10-CM | POA: Diagnosis not present

## 2021-09-05 NOTE — Telephone Encounter (Signed)
Requested Prescriptions  Pending Prescriptions Disp Refills  . gabapentin (NEURONTIN) 100 MG capsule [Pharmacy Med Name: GABAPENTIN 100 MG CAPSULE] 270 capsule 0    Sig: TAKE 1 CAPSULE BY MOUTH THREE TIMES A DAY     Neurology: Anticonvulsants - gabapentin Passed - 09/04/2021  5:02 PM      Passed - Valid encounter within last 12 months    Recent Outpatient Visits          1 month ago Essential hypertension   [facility] Marcine Matar, MD   4 months ago Gait disturbance   [facility] Marcine Matar, MD   10 months ago Essential hypertension   [facility] Marcine Matar, MD   1 year ago Need for influenza vaccination   [facility] Lois Huxley, Cornelius Moras, RPH-CPP   1 year ago Primary osteoarthritis of both knees   [facility] Marcine Matar, MD      Future Appointments            In 2 months Laural Benes, Binnie Rail, MD [facility]   In 3 months Turner, Cornelious Bryant, MD [facility] [address] Office, [facility]

## 2021-09-06 ENCOUNTER — Other Ambulatory Visit: Payer: Self-pay

## 2021-09-06 ENCOUNTER — Other Ambulatory Visit: Payer: Self-pay | Admitting: Internal Medicine

## 2021-09-06 DIAGNOSIS — E119 Type 2 diabetes mellitus without complications: Secondary | ICD-10-CM | POA: Diagnosis not present

## 2021-09-06 DIAGNOSIS — I4891 Unspecified atrial fibrillation: Secondary | ICD-10-CM | POA: Diagnosis not present

## 2021-09-06 DIAGNOSIS — Z79899 Other long term (current) drug therapy: Secondary | ICD-10-CM | POA: Diagnosis not present

## 2021-09-06 DIAGNOSIS — M170 Bilateral primary osteoarthritis of knee: Secondary | ICD-10-CM

## 2021-09-06 DIAGNOSIS — M5410 Radiculopathy, site unspecified: Secondary | ICD-10-CM | POA: Diagnosis not present

## 2021-09-06 MED ORDER — DICLOFENAC SODIUM 1 % EX GEL
CUTANEOUS | 1 refills | Status: DC
  Filled 2021-09-06: qty 100, 12d supply, fill #0
  Filled 2021-09-17 – 2021-11-05 (×2): qty 400, 50d supply, fill #0

## 2021-09-06 NOTE — Telephone Encounter (Signed)
Requested Prescriptions  Pending Prescriptions Disp Refills  . diclofenac Sodium (VOLTAREN) 1 % GEL 400 g 3    Sig: Apply 2 grams to knees 4 times a day as needed.     Analgesics:  Topicals Passed - 09/06/2021 10:14 AM      Passed - Valid encounter within last 12 months    Recent Outpatient Visits          1 month ago Essential hypertension   [facility] Marcine Matar, MD   4 months ago Gait disturbance   [facility] Marcine Matar, MD   10 months ago Essential hypertension   [place] [facility] Marcine Matar, MD   1 year ago Need for influenza vaccination   [facility] Lois Huxley, Cornelius Moras, RPH-CPP   1 year ago Primary osteoarthritis of both knees   [facility] Marcine Matar, MD      Future Appointments            In 2 months Laural Benes Binnie Rail, MD [facility]   In 2 months Turner, Cornelious Bryant, MD [facility], [facility]

## 2021-09-06 NOTE — Telephone Encounter (Signed)
Medication Refill - Medication:  diclofenac Sodium (VOLTAREN) 1 % GEL   Has the patient contacted their pharmacy? Yes.   Contact PCP  Preferred Pharmacy (with phone number or street name):  [facility]  [address], [place] [address]  Phone:  [phone]  Fax:  [phone]  Has the patient been seen for an appointment in the last year OR does the patient have an upcoming appointment? Yes.    Agent: Please be advised that RX refills may take up to 3 business days. We ask that you follow-up with your pharmacy.

## 2021-09-11 ENCOUNTER — Telehealth: Payer: Self-pay

## 2021-09-11 NOTE — Telephone Encounter (Signed)
Per Anna [name], [employer], patient received her power wheelchair 09/05/21.

## 2021-09-12 DIAGNOSIS — M545 Low back pain, unspecified: Secondary | ICD-10-CM | POA: Diagnosis not present

## 2021-09-12 DIAGNOSIS — M170 Bilateral primary osteoarthritis of knee: Secondary | ICD-10-CM | POA: Diagnosis not present

## 2021-09-13 ENCOUNTER — Other Ambulatory Visit: Payer: Self-pay

## 2021-09-17 ENCOUNTER — Other Ambulatory Visit: Payer: Self-pay

## 2021-09-17 ENCOUNTER — Telehealth: Payer: Self-pay | Admitting: Internal Medicine

## 2021-09-17 DIAGNOSIS — M170 Bilateral primary osteoarthritis of knee: Secondary | ICD-10-CM | POA: Diagnosis not present

## 2021-09-17 DIAGNOSIS — M1712 Unilateral primary osteoarthritis, left knee: Secondary | ICD-10-CM | POA: Diagnosis not present

## 2021-09-17 NOTE — Telephone Encounter (Signed)
Pt daughter called and stated they were ready for the pt to Home health aid/ and wanted to speak with Dr. Laural Benes or the social worker about this / please advise

## 2021-09-18 NOTE — Telephone Encounter (Signed)
PCS referral faxed to [facility] 

## 2021-09-24 DIAGNOSIS — M1711 Unilateral primary osteoarthritis, right knee: Secondary | ICD-10-CM | POA: Diagnosis not present

## 2021-09-27 ENCOUNTER — Telehealth: Payer: Self-pay | Admitting: Internal Medicine

## 2021-09-27 NOTE — Telephone Encounter (Signed)
Copied from CRM [id number]. Topic: General - Other >> Sep 25, 2021  3:27 PM Glean Salen wrote: Reason for [id number] guardian called in about getting info on program for her to get clothes and things she needs. Please call back.

## 2021-10-17 ENCOUNTER — Other Ambulatory Visit: Payer: Self-pay | Admitting: Internal Medicine

## 2021-10-17 ENCOUNTER — Telehealth: Payer: Self-pay

## 2021-10-17 DIAGNOSIS — R7303 Prediabetes: Secondary | ICD-10-CM

## 2021-10-17 NOTE — Telephone Encounter (Signed)
Call placed to [facility], spoke to Jobrick who confirmed that the [facility] referral has been received and they are ready to schedule the assessment

## 2021-10-17 NOTE — Telephone Encounter (Signed)
Requested Prescriptions  Pending Prescriptions Disp Refills   metFORMIN (GLUCOPHAGE) 500 MG tablet [Pharmacy Med Name: METFORMIN HCL 500 MG TABLET] 90 tablet 0    Sig: TAKE 1 TABLET BY MOUTH EVERY DAY WITH BREAKFAST     Endocrinology:  Diabetes - Biguanides Failed - 10/17/2021  1:18 AM      Failed - eGFR in normal range and within 360 days    GFR, Est African American  Date Value Ref Range Status  10/26/2015 64 >=60 mL/min Final   GFR calc Af Amer  Date Value Ref Range Status  07/07/2020 56 (L) >59 mL/min/1.73 Final    Comment:    **[facility] currently reports eGFR in compliance with the current**   recommendations of the [employer]. [facility] will   update reporting as new guidelines are published from the NKF-ASN   Task force.    GFR, Est Non African American  Date Value Ref Range Status  10/26/2015 56 (L) >=60 mL/min Final    Comment:      The estimated GFR is a calculation valid for adults (>=43 years old) that uses the CKD-EPI algorithm to adjust for age and sex. It is   not to be used for children, pregnant women, hospitalized patients,    patients on dialysis, or with rapidly changing kidney function. According to the NKDEP, eGFR >89 is normal, 60-89 shows mild impairment, 30-59 shows moderate impairment, 15-29 shows severe impairment and <15 is ESRD.      GFR, Estimated  Date Value Ref Range Status  03/19/2021 58 (L) >60 mL/min Final    Comment:    (NOTE) Calculated using the CKD-EPI Creatinine Equation (2021)    GFR  Date Value Ref Range Status  06/22/2015 54.53 (L) >60.00 mL/min Final         Passed - Cr in normal range and within 360 days    Creat  Date Value Ref Range Status  06/19/2016 1.04 (H) 0.60 - 0.93 mg/dL Final    Comment:      For patients > or = 83 years of age: The upper reference limit for Creatinine is approximately 13% higher for people identified as African-American.      Creatinine, Ser  Date Value Ref Range  Status  03/19/2021 0.97 0.44 - 1.00 mg/dL Final         Passed - HBA1C is between 0 and 7.9 and within 180 days    HbA1c, POC (prediabetic range)  Date Value Ref Range Status  10/09/2018 5.7 5.7 - 6.4 % Final   Hgb A1c MFr Bld  Date Value Ref Range Status  06/13/2021 5.8 (H) 4.8 - 5.6 % Final    Comment:             Prediabetes: 5.7 - 6.4          Diabetes: >6.4          Glycemic control for adults with diabetes: <7.0          Passed - Valid encounter within last 6 months    Recent Outpatient Visits          2 months ago Essential hypertension   [place] Ladell Pier, MD   5 months ago Gait disturbance   [place] Ladell Pier, MD   11 months ago Essential hypertension   [place] Ladell Pier, MD   1 year ago Need for influenza vaccination  [place], RPH-CPP   1 year ago Primary osteoarthritis of both knees   [place], MD      Future Appointments            In 1 month Wynetta Emery, Dalbert Batman, MD [place]   In 1 month Turner, Eber Hong, MD [place], [facility]

## 2021-10-27 ENCOUNTER — Other Ambulatory Visit: Payer: Self-pay | Admitting: Cardiology

## 2021-11-05 ENCOUNTER — Other Ambulatory Visit: Payer: Self-pay

## 2021-11-06 NOTE — Telephone Encounter (Signed)
After thoroughly researching, I was unable to locate any clothing services to assist.

## 2021-11-19 ENCOUNTER — Ambulatory Visit: Payer: Medicare Other | Admitting: Internal Medicine

## 2021-11-19 ENCOUNTER — Telehealth: Payer: Self-pay

## 2021-11-19 NOTE — Telephone Encounter (Signed)
Arline Asp dropped off handicap placard application, placed in providers folder. Aware of time frame for [place] to fill out application.

## 2021-11-19 NOTE — Telephone Encounter (Signed)
Will call pt to pick up once completed

## 2021-11-22 ENCOUNTER — Ambulatory Visit: Payer: Medicare Other | Attending: Internal Medicine | Admitting: Internal Medicine

## 2021-11-22 ENCOUNTER — Encounter: Payer: Self-pay | Admitting: Internal Medicine

## 2021-11-22 VITALS — BP 140/57

## 2021-11-22 DIAGNOSIS — R7303 Prediabetes: Secondary | ICD-10-CM | POA: Diagnosis not present

## 2021-11-22 DIAGNOSIS — I10 Essential (primary) hypertension: Secondary | ICD-10-CM | POA: Diagnosis not present

## 2021-11-22 DIAGNOSIS — I4892 Unspecified atrial flutter: Secondary | ICD-10-CM

## 2021-11-22 DIAGNOSIS — E785 Hyperlipidemia, unspecified: Secondary | ICD-10-CM

## 2021-11-22 DIAGNOSIS — R269 Unspecified abnormalities of gait and mobility: Secondary | ICD-10-CM

## 2021-11-22 DIAGNOSIS — M170 Bilateral primary osteoarthritis of knee: Secondary | ICD-10-CM

## 2021-11-22 DIAGNOSIS — N1831 Chronic kidney disease, stage 3a: Secondary | ICD-10-CM

## 2021-11-22 NOTE — Progress Notes (Signed)
Patient ID: Anna [name], female   DOB: [birth year], 86 y.o.   MRN: [id number] Virtual Visit via Telephone Note  I connected with Hendricks Limes on 11/22/2021 at 2:54 p.m by telephone and verified that I am speaking with the correct person using two identifiers  Location: Patient: home Provider: office  Participants: Myself Patient   I discussed the limitations, risks, security and privacy concerns of performing an evaluation and management service by telephone and the availability of in person appointments. I also discussed with the patient that there may be a patient responsible charge related to this service. The patient expressed understanding and agreed to proceed.   History of Present Illness: history of atrial flutter on Xarelto with history of cardioversion 2015, HTN, trivial AR, mild MR,OA knees, HL, prediabetes, CKD 3, ACD, lumbar radiculopathy, hx of vertebral fx receiving pain management through Dr. Dorothea Ogle at [facility].  patient last seen by me 06/2021.  Today's visit is for chronic disease management.  Gait disturbance/OA knees/Vertebral fracture:  no falls since last visit.  She did get her mobility chair and is using it.  She finds it very helpful. Still followed by [facility] for pain management on Norco.  Denies any significant drowsiness with the medication. She reports taking calcium plus vitamin D from over-the-counter. -Reports [name] has called [facility] a number of times for PCS and no one will call her back.  She thinks they did get letter stating she was approved for CAP.  Saw derm 4 days ago.  Bx done on of lesions on 2 of her fingers and arm.  No results as yet.  HL/HTN/CKD 3:  she is taking Crestor and was wondering if this was her Ca+ pill -taking Azor 10/20 mg.  BP this a.m was 140/57.  This was reading before she took her med Limits salt in foods -no CP/SOB/dizziness/HA -last eGFR was 58 in 02/2021  A.flutter:  no bruising or bleeding on  Xarelto Has f/u appt with Dr. Radford Pax 12/04/2021.  PreDM:  taking and tolerating Metformin 500 mg 1/2 tab daily  HM: We will plan to give her the shingles vaccine on her next visit and they Pneumovax 23.   Outpatient Encounter Medications as of 11/22/2021  Medication Sig   amlodipine-olmesartan (AZOR) 10-20 MG tablet TAKE 1 TABLET BY MOUTH EVERY DAY   cyclobenzaprine (FLEXERIL) 10 MG tablet Take 0.5 tablets (5 mg total) by mouth 2 (two) times daily as needed for muscle spasms.   diclofenac Sodium (VOLTAREN) 1 % GEL Apply 2 grams to knees 4 times a day as needed.   docusate sodium (COLACE) 100 MG capsule Take 100 mg by mouth daily.    ferrous sulfate 325 (65 FE) MG tablet Take 325 mg by mouth daily with breakfast.   gabapentin (NEURONTIN) 100 MG capsule TAKE 1 CAPSULE BY MOUTH THREE TIMES A DAY   HYDROcodone-acetaminophen (NORCO/VICODIN) 5-325 MG tablet Take 1 tablet by mouth every 6 (six) hours as needed for moderate pain.   metFORMIN (GLUCOPHAGE) 500 MG tablet TAKE 1 TABLET BY MOUTH EVERY DAY WITH BREAKFAST   Misc. Devices Scientist, water quality. Devices MISC Rollator walker   Misc. Devices [facility] bed   Omega-3 Fatty Acids (FISH OIL CONCENTRATE PO) Take 1 capsule by mouth daily.    rivaroxaban (XARELTO) 20 MG TABS tablet TAKE 1 TABLET EVERY DAY WITH SUPPER..DUE FOR OFFICE VISIT   rosuvastatin (CRESTOR) 10 MG tablet TAKE 1 TABLET BY MOUTH EVERY DAY  No facility-administered encounter medications on file as of 11/22/2021.      Observations/Objective: No direct observation done as this was a telephone visit.  Results for orders placed or performed in visit on 06/13/21  Hemoglobin A1c  Result Value Ref Range   Hgb A1c MFr Bld 5.8 (H) 4.8 - 5.6 %   Est. average glucose Bld gHb Est-mCnc 120 mg/dL     Chemistry      Component Value Date/Time   NA 137 03/19/2021 1409   NA 142 07/07/2020 1421   K 4.2 03/19/2021 1409   CL 103 03/19/2021 1409   CO2 25 03/19/2021 1409    BUN 22 03/19/2021 1409   BUN 16 07/07/2020 1421   CREATININE 0.97 03/19/2021 1409   CREATININE 1.04 (H) 06/19/2016 1503      Component Value Date/Time   CALCIUM 9.9 03/19/2021 1409   ALKPHOS 84 07/07/2020 1421   AST 23 07/07/2020 1421   ALT 10 07/07/2020 1421   BILITOT 0.5 07/07/2020 1421     Lab Results  Component Value Date   WBC 7.8 03/19/2021   HGB 12.3 03/19/2021   HCT 38.2 03/19/2021   MCV 89.3 03/19/2021   PLT 248 03/19/2021     Assessment and Plan: 1. Essential hypertension Reported blood pressure reading today is not at goal but she had not taken her medicine as yet when she took the blood pressure reading.  She will continue current dose of Azor. - Comprehensive metabolic panel; Future  2. Primary osteoarthritis of both knees 3. Gait disturbance Stable.  She now has her mobility chair which she is using.  She continues pain management through [facility] pain clinic. Will send message to our caseworker regarding her question about referral to [facility] services.  4. Prediabetes She will continue metformin 500 mg half a tablet daily.  5. Atrial flutter, unspecified type ([place]) Stable on Xarelto.  Keep upcoming appointment with cardiologist Dr. Radford Pax. - CBC; Future  6. Stage 3a chronic kidney disease (HCC) Stable.  Advised to avoid oral NSAIDs.  7. Hyperlipidemia, unspecified hyperlipidemia type Continue Crestor.   Follow Up Instructions: 4 mths   I discussed the assessment and treatment plan with the patient. The patient was provided an opportunity to ask questions and all were answered. The patient agreed with the plan and demonstrated an understanding of the instructions.   The patient was advised to call back or seek an in-person evaluation if the symptoms worsen or if the condition fails to improve as anticipated.  I  Spent 13 minutes on this telephone encounter  This note has been created with Designer, industrial/product. Any transcriptional errors are unintentional.  Karle Plumber, MD

## 2021-11-23 ENCOUNTER — Telehealth: Payer: Self-pay

## 2021-11-23 NOTE — Telephone Encounter (Signed)
Contacted pt granddaughter and lvm making her aware that form is ready for pick and she can pick it up on Monday and if she has any questions or concerns to give [place] a call

## 2021-11-23 NOTE — Telephone Encounter (Signed)
-----   Message from Marcine Matar, MD sent at 11/22/2021  5:37 PM EST ----- Needs lab appointment in 1 to 2 weeks.

## 2021-11-23 NOTE — Telephone Encounter (Signed)
Call placed to patient's granddaughter and VM was left to call office to schedule lab appointment.

## 2021-11-23 NOTE — Telephone Encounter (Signed)
Pt has been scheduled.  °

## 2021-11-23 NOTE — Telephone Encounter (Signed)
-----   Message from Marcine Matar, MD sent at 11/22/2021  5:34 PM EST ----- F/u in 4 mths

## 2021-11-27 ENCOUNTER — Telehealth: Payer: Self-pay

## 2021-11-27 NOTE — Telephone Encounter (Signed)
Call placed to patient's granddaughter, [name] regarding PCS.  She explained that she never heard from [employer].  She has called multiple times and has not heard back from them. Informed her that this CM called [facility] on 10/17/2022 and was told that the referral has been received and they were ready to schedule an assessment.  Instructed [name] to call [facility] back and not to hang up but wait to speak with someone and inquire about the assessment. If another PCS referral is needed because this one expired, please let this CM know.  This CM explained to [place] that she should also call [facility] back and make sure that her grandmother is still on the wait list.  She could receive PCS while waiting for CAP , she just can't receive both services at the same time.

## 2021-11-30 ENCOUNTER — Other Ambulatory Visit: Payer: Self-pay | Admitting: Cardiology

## 2021-11-30 ENCOUNTER — Other Ambulatory Visit: Payer: Self-pay

## 2021-11-30 ENCOUNTER — Other Ambulatory Visit: Payer: Self-pay | Admitting: Internal Medicine

## 2021-11-30 ENCOUNTER — Ambulatory Visit: Payer: Medicare Other | Attending: Internal Medicine

## 2021-11-30 DIAGNOSIS — I4892 Unspecified atrial flutter: Secondary | ICD-10-CM

## 2021-11-30 DIAGNOSIS — I10 Essential (primary) hypertension: Secondary | ICD-10-CM

## 2021-11-30 DIAGNOSIS — R7303 Prediabetes: Secondary | ICD-10-CM

## 2021-11-30 DIAGNOSIS — M170 Bilateral primary osteoarthritis of knee: Secondary | ICD-10-CM

## 2021-11-30 NOTE — Telephone Encounter (Signed)
Xarelto 20mg  refill request received. Pt is 86 years old, weight-80.1kg, Crea-0.97 on 03/19/2021, last seen by Dr. 03/21/2021 on 03/20/2020 & pending an appt with Dr. 03/22/2020 on 12/04/2021, Diagnosis-Afib, CrCl-54.20ml/min; Dose is appropriate based on dosing criteria. Xarelto refill last seen on 08/02/2021 with 90 tabs and 1 refill, which is a total of a 6 month supply. The pt's supply should complete in April 2023. Will await for pt to adhere to appt before sending the refill.

## 2021-12-01 ENCOUNTER — Telehealth: Payer: Self-pay | Admitting: Internal Medicine

## 2021-12-01 DIAGNOSIS — R7989 Other specified abnormal findings of blood chemistry: Secondary | ICD-10-CM

## 2021-12-01 DIAGNOSIS — E875 Hyperkalemia: Secondary | ICD-10-CM

## 2021-12-01 DIAGNOSIS — D638 Anemia in other chronic diseases classified elsewhere: Secondary | ICD-10-CM

## 2021-12-01 DIAGNOSIS — D649 Anemia, unspecified: Secondary | ICD-10-CM

## 2021-12-01 LAB — CBC
Hematocrit: 32.8 % — ABNORMAL LOW (ref 34.0–46.6)
Hemoglobin: 10.8 g/dL — ABNORMAL LOW (ref 11.1–15.9)
MCH: 28.7 pg (ref 26.6–33.0)
MCHC: 32.9 g/dL (ref 31.5–35.7)
MCV: 87 fL (ref 79–97)
Platelets: 266 10*3/uL (ref 150–450)
RBC: 3.76 x10E6/uL — ABNORMAL LOW (ref 3.77–5.28)
RDW: 12.6 % (ref 11.7–15.4)
WBC: 5.3 10*3/uL (ref 3.4–10.8)

## 2021-12-01 LAB — COMPREHENSIVE METABOLIC PANEL
ALT: 44 IU/L — ABNORMAL HIGH (ref 0–32)
AST: 33 IU/L (ref 0–40)
Albumin/Globulin Ratio: 1.6 (ref 1.2–2.2)
Albumin: 4.1 g/dL (ref 3.6–4.6)
Alkaline Phosphatase: 190 IU/L — ABNORMAL HIGH (ref 44–121)
BUN/Creatinine Ratio: 18 (ref 12–28)
BUN: 17 mg/dL (ref 8–27)
Bilirubin Total: 0.6 mg/dL (ref 0.0–1.2)
CO2: 24 mmol/L (ref 20–29)
Calcium: 9.8 mg/dL (ref 8.7–10.3)
Chloride: 101 mmol/L (ref 96–106)
Creatinine, Ser: 0.93 mg/dL (ref 0.57–1.00)
Globulin, Total: 2.6 g/dL (ref 1.5–4.5)
Glucose: 105 mg/dL — ABNORMAL HIGH (ref 70–99)
Potassium: 5.3 mmol/L — ABNORMAL HIGH (ref 3.5–5.2)
Sodium: 137 mmol/L (ref 134–144)
Total Protein: 6.7 g/dL (ref 6.0–8.5)
eGFR: 61 mL/min/{1.73_m2} (ref >59)

## 2021-12-01 MED ORDER — FERROUS SULFATE 325 (65 FE) MG PO TABS: 325.0000 mg | ORAL_TABLET | Freq: Every day | ORAL | 1 refills | Status: DC

## 2021-12-01 NOTE — Telephone Encounter (Signed)
Phone call placed to patient's daughter [name] this evening.  We were able to do a three-way with the patient.  I informed them that I was calling about her lab results. Informed them that she has become anemic again compared to last CBC done in May of last year.  I inquired whether she was still taking iron supplement and patient stated that she was taking iron that she purchased over-the-counter but had stopped several months ago.  She denies seeing any blood in the stools or black stools.  Denies any feeling of dizziness.  However her granddaughter tells me that she has been feeling weak and tired.  I will have iron studies added to labs that were done.  In the meantime I recommend restarting iron supplement and I will send the prescription to her pharmacy. 2.  Hyperkalemia: Potassium level 5.3.  I recommend cutting back on potassium rich foods like bananas, oranges, orange juice.  Return to the lab in 1 week for repeat check.  Her creatinine level is good. 3.  Mild elevation in ALT of 44.  Alkaline phosphatase elevated with level of 190.  May be due to Crestor.  I will have antimitochondrial antibody added to labs.  Further management will be based on results.

## 2021-12-03 ENCOUNTER — Telehealth: Payer: Self-pay | Admitting: Internal Medicine

## 2021-12-04 ENCOUNTER — Other Ambulatory Visit: Payer: Self-pay

## 2021-12-04 ENCOUNTER — Encounter: Payer: Self-pay | Admitting: Cardiology

## 2021-12-04 ENCOUNTER — Ambulatory Visit (INDEPENDENT_AMBULATORY_CARE_PROVIDER_SITE_OTHER): Payer: Medicare Other | Admitting: Cardiology

## 2021-12-04 VITALS — BP 138/60 | HR 67 | Ht 62.0 in | Wt 176.0 lb

## 2021-12-04 DIAGNOSIS — I10 Essential (primary) hypertension: Secondary | ICD-10-CM | POA: Diagnosis not present

## 2021-12-04 DIAGNOSIS — I4821 Permanent atrial fibrillation: Secondary | ICD-10-CM

## 2021-12-04 DIAGNOSIS — I050 Rheumatic mitral stenosis: Secondary | ICD-10-CM | POA: Diagnosis not present

## 2021-12-04 MED ORDER — APIXABAN 5 MG PO TABS: 5.0000 mg | ORAL_TABLET | Freq: Two times a day (BID) | ORAL | 11 refills | Status: DC

## 2021-12-04 NOTE — Telephone Encounter (Signed)
Pt has an appt with Dr. Mayford Knife today; she was last seen by Dr. Mayford Knife on 03/20/2020. Will await for pt to come to appt today before refilling since overdue.

## 2021-12-04 NOTE — Telephone Encounter (Signed)
Contacted pt granddaughter and lvm

## 2021-12-04 NOTE — Patient Instructions (Signed)
Medication Instructions:  Your physician has recommended you make the following change in your medication:  1) STOP taking Xarelto 2) START taking Eliquis 5 mg twice daily   *If you need a refill on your cardiac medications before your next appointment, please call your pharmacy*   Testing/Procedures: Your physician has requested that you have an echocardiogram in July 2023. Echocardiography is a painless test that uses sound waves to create images of your heart. It provides your doctor with information about the size and shape of your heart and how well your hearts chambers and valves are working. This procedure takes approximately one hour. There are no restrictions for this procedure.  Follow-Up: At [facility], you and your health needs are our priority.  As part of our continuing mission to provide you with exceptional heart care, we have created designated Provider Care Teams.  These Care Teams include your primary Cardiologist (physician) and Advanced Practice Providers (APPs -  Physician Assistants and Nurse Practitioners) who all work together to provide you with the care you need, when you need it.   Your next appointment:   1 year(s)  The format for your next appointment:   In Person  Provider:   Armanda Magic, MD

## 2021-12-04 NOTE — Progress Notes (Signed)
Date:  12/04/2021   ID:  Anna [name], DOB [birth year], MRN [id number]   PCP:  Marcine Matar, MD  Cardiologist:  Armanda Magic, MD  Electrophysiologist:  None   Chief Complaint:  AF, HTN  History of Present Illness:    Anna [name] is a 86 y.o. female with a hx of HTN, permanent  atrial fibrillation on Xarelto due to her CHADS2VASC score of 3 and mild MR and mild mitral stenosis by echo 7-22.  She is here today for followup and is doing well.  She denies any chest pain or pressure, SOB, DOE, PND, orthopnea, LE edema, dizziness, palpitations or syncope. SHe is compliant with her meds and is tolerating meds with no SE.      Prior CV studies:   The following studies were reviewed today:  none  Past Medical History:  Diagnosis Date   Hyperlipidemia    Hypertension    Mitral stenosis    mild by echo 04/2021   Osteoarthritis    Overweight(278.02)    Permanent atrial fibrillation (HCC)    Past Surgical History:  Procedure Laterality Date   CARDIOVERSION N/A 03/24/2014   Procedure: CARDIOVERSION;  Surgeon: Quintella Reichert, MD;  Location: [facility];  Service: Cardiovascular;  Laterality: N/A;     Current Meds  Medication Sig   amlodipine-olmesartan (AZOR) 10-20 MG tablet TAKE 1 TABLET BY MOUTH EVERY DAY   cyclobenzaprine (FLEXERIL) 10 MG tablet Take 0.5 tablets (5 mg total) by mouth 2 (two) times daily as needed for muscle spasms.   diclofenac Sodium (VOLTAREN) 1 % GEL Apply 2 grams to knees 4 times a day as needed.   docusate sodium (COLACE) 100 MG capsule Take 100 mg by mouth daily.    ferrous sulfate 325 (65 FE) MG tablet Take 1 tablet (325 mg total) by mouth daily with breakfast.   gabapentin (NEURONTIN) 100 MG capsule TAKE 1 CAPSULE BY MOUTH THREE TIMES A DAY   HYDROcodone-acetaminophen (NORCO/VICODIN) 5-325 MG tablet Take 1 tablet by mouth every 6 (six) hours as needed for moderate pain.   metFORMIN (GLUCOPHAGE) 500 MG tablet TAKE 1 TABLET BY MOUTH EVERY DAY WITH  BREAKFAST   Misc. Devices Event organiser. Devices MISC Rollator walker   Misc. Devices MISC Hospital bed   Omega-3 Fatty Acids (FISH OIL CONCENTRATE PO) Take 1 capsule by mouth daily.    rivaroxaban (XARELTO) 20 MG TABS tablet TAKE 1 TABLET EVERY DAY WITH SUPPER..DUE FOR OFFICE VISIT   rosuvastatin (CRESTOR) 10 MG tablet TAKE 1 TABLET BY MOUTH EVERY DAY     Allergies:   Patient has no known allergies.   Social History   Tobacco Use   Smoking status: Never   Smokeless tobacco: Never  Substance Use Topics   Alcohol use: No   Drug use: No     Family Hx: The patient's family history includes Alzheimer's disease in her mother.  ROS:   Please see the history of present illness.     All other systems reviewed and are negative.   Labs/Other Tests and Data Reviewed:    Recent Labs: 11/30/2021: ALT 44; BUN 17; Creatinine, Ser 0.93; Hemoglobin 10.8; Platelets 266; Potassium 5.3; Sodium 137   Recent Lipid Panel Lab Results  Component Value Date/Time   CHOL 108 07/07/2020 02:21 PM   TRIG 67 07/07/2020 02:21 PM   HDL 47 07/07/2020 02:21 PM   CHOLHDL 2.3 07/07/2020 02:21 PM   CHOLHDL 1.9 06/29/2015 09:45 AM  LDLCALC 47 07/07/2020 02:21 PM    Wt Readings from Last 3 Encounters:  12/04/21 176 lb (79.8 kg)  07/20/21 176 lb 9.6 oz (80.1 kg)  04/24/21 187 lb 12.8 oz (85.2 kg)     Objective:    Vital Signs:  BP 138/60    Pulse 67    Ht 5\' 2"  (1.575 m)    Wt 176 lb (79.8 kg)    SpO2 98%    BMI 32.19 kg/m    GEN: Well nourished, well developed in no acute distress HEENT: Normal NECK: No JVD; No carotid bruits LYMPHATICS: No lymphadenopathy CARDIAC:irregularly irregular, no murmurs, rubs, gallops RESPIRATORY:  Clear to auscultation without rales, wheezing or rhonchi  ABDOMEN: Soft, non-tender, non-distended MUSCULOSKELETAL:  trace edema; No deformity  SKIN: Warm and dry NEUROLOGIC:  Alert and oriented x 3 PSYCHIATRIC:  Normal affect   ASSESSMENT & PLAN:     1.  Permanent Atrial Fibrillation -She is maintaining atrial fibrillation with controlled ventricular response on EKG and Exam today -2D echo 05/07/2021 showed normal LV function with severe biatrial enlargement -She has not had any bleeding problems on DOAC -I have personally reviewed and interpreted outside PCP performed by patient's PCP which showed serum creatinine 0.93 and potassium 5.3 and hemoglobin 10.8. -given her advanced age I am going to change her from Xarelto to Eliquis 5mg  BID for less bleeding risk  2.  HTN -BP is adequately controlled on exam today.   -Continue prescription drug management with amlodipine-olmesartan 10-20 mg daily with as needed refills   3.  Mitral regurgitation/mitral stenosis -2D echo 7/22 showed normal LV function with trivial MR and mild mitral stenosis -Repeat echo 7/23 to make sure this is stable   Medication Adjustments/Labs and Tests Ordered: Current medicines are reviewed at length with the patient today.  Concerns regarding medicines are outlined above.  Tests Ordered: No orders of the defined types were placed in this encounter.  Medication Changes: No orders of the defined types were placed in this encounter.   Disposition:  Follow up in 1 year(s)  Signed, 8/22, MD  12/04/2021 3:29 PM    [facility]

## 2021-12-04 NOTE — Telephone Encounter (Signed)
Grandaughter for pt called in wants to confirm she can just walkin with pt to redo blood work later this week. Please call back

## 2021-12-04 NOTE — Addendum Note (Signed)
Addended by: Theresia Majors on: 12/04/2021 03:52 PM   Modules accepted: Orders

## 2021-12-04 NOTE — Telephone Encounter (Signed)
Advised of lab hours and lunch hour so that patient can come in the office for lab test.

## 2021-12-05 NOTE — Telephone Encounter (Signed)
Pt saw Dr. Mendel Corning on yesterday, 12/04/2021, and Xarelto was d/c'd and Eliquis was started. Will deny this refill. Eliquis was sent in for a year supply; pt is 86 yrs old, wt-79.8kg, crea-0.93 on 11/30/2021

## 2021-12-07 ENCOUNTER — Other Ambulatory Visit: Payer: Self-pay

## 2021-12-07 ENCOUNTER — Ambulatory Visit: Payer: Medicare Other | Attending: Internal Medicine

## 2021-12-07 DIAGNOSIS — E875 Hyperkalemia: Secondary | ICD-10-CM

## 2021-12-07 DIAGNOSIS — D649 Anemia, unspecified: Secondary | ICD-10-CM

## 2021-12-07 DIAGNOSIS — R7989 Other specified abnormal findings of blood chemistry: Secondary | ICD-10-CM

## 2021-12-09 ENCOUNTER — Other Ambulatory Visit: Payer: Self-pay | Admitting: Internal Medicine

## 2021-12-09 LAB — IRON,TIBC AND FERRITIN PANEL
Ferritin: 527 ng/mL — ABNORMAL HIGH (ref 15–150)
Iron Saturation: 19 % (ref 15–55)
Iron: 66 ug/dL (ref 27–139)
Total Iron Binding Capacity: 342 ug/dL (ref 250–450)
UIBC: 276 ug/dL (ref 118–369)

## 2021-12-09 LAB — POTASSIUM: Potassium: 4.7 mmol/L (ref 3.5–5.2)

## 2021-12-09 LAB — MITOCHONDRIAL ANTIBODIES: Mitochondrial Ab: <20 Units (ref 0.0–20.0)

## 2021-12-09 MED ORDER — ROSUVASTATIN CALCIUM 10 MG PO TABS: ORAL_TABLET | ORAL | 5 refills | Status: DC

## 2021-12-09 NOTE — Progress Notes (Signed)
Let pt granddaughter [name] and pt know that her potassium level came back normal. I think the mild elevation in liver function tests is likely due to Rosuvastatin. I recommend changing dose to one tablet 4 days a wk. Take iron supplement four days a wk also.

## 2021-12-12 ENCOUNTER — Telehealth: Payer: Self-pay

## 2021-12-12 NOTE — Telephone Encounter (Signed)
Contacted pt granddaughter to go over lab results made aware and doesn't have any questions or concerns

## 2021-12-18 ENCOUNTER — Other Ambulatory Visit: Payer: Self-pay | Admitting: Internal Medicine

## 2021-12-18 ENCOUNTER — Other Ambulatory Visit: Payer: Self-pay | Admitting: Cardiology

## 2021-12-18 DIAGNOSIS — I4892 Unspecified atrial flutter: Secondary | ICD-10-CM

## 2021-12-18 DIAGNOSIS — M170 Bilateral primary osteoarthritis of knee: Secondary | ICD-10-CM

## 2021-12-19 NOTE — Telephone Encounter (Signed)
Requested medication (s) are due for refill today: No  Requested medication (s) are on the active medication list: Yes  Last refill:  12/01/21  Future visit scheduled: Yes  Notes to clinic:  Pharmacy requesting additional refill.    Requested Prescriptions  Pending Prescriptions Disp Refills   gabapentin (NEURONTIN) 100 MG capsule [Pharmacy Med Name: GABAPENTIN 100 MG CAPSULE] 270 capsule 0    Sig: TAKE 1 CAPSULE BY MOUTH THREE TIMES A DAY     Neurology: Anticonvulsants - gabapentin Passed - 12/18/2021  6:01 PM      Passed - Cr in normal range and within 360 days    Creat  Date Value Ref Range Status  06/19/2016 1.04 (H) 0.60 - 0.93 mg/dL Final    Comment:      For patients > or = 84 years of age: The upper reference limit for Creatinine is approximately 13% higher for people identified as African-American.      Creatinine, Ser  Date Value Ref Range Status  11/30/2021 0.93 0.57 - 1.00 mg/dL Final          Passed - Completed PHQ-2 or PHQ-9 in the last 360 days      Passed - Valid encounter within last 12 months    Recent Outpatient Visits           3 weeks ago Essential hypertension   [facility] Marcine Matar, MD   5 months ago Essential hypertension   [facility] Marcine Matar, MD   7 months ago Gait disturbance   [facility] Marcine Matar, MD   1 year ago Essential hypertension   [facility] Marcine Matar, MD   1 year ago Need for influenza vaccination   [facility] Lois Huxley, Cornelius Moras, RPH-CPP       Future Appointments             In 3 months Laural Benes Binnie Rail, MD [facility]

## 2022-01-21 ENCOUNTER — Other Ambulatory Visit: Payer: Self-pay | Admitting: Internal Medicine

## 2022-01-21 ENCOUNTER — Other Ambulatory Visit: Payer: Self-pay | Admitting: Cardiology

## 2022-01-21 DIAGNOSIS — I4892 Unspecified atrial flutter: Secondary | ICD-10-CM

## 2022-01-21 DIAGNOSIS — M170 Bilateral primary osteoarthritis of knee: Secondary | ICD-10-CM

## 2022-01-21 NOTE — Telephone Encounter (Signed)
Pt last saw Dr Mayford Knife 12/04/21, last labs 11/30/21 Creat 0.93, age 86, weight 79.8kg, CrCl 56.73, based on specified criteria pt is on appropriate dosage of Xarelto 20mg  QD for afib.  ?At last OV with Dr , Xarelto was discontinued, per Dr Mayford Knife note: given her advanced age I am going to change her from Xarelto to Eliquis 5mg  BID for less bleeding risk ?Xarelto refill refused pt is now on Eliquis, script was sent into pharmacy at [facility] 12/04/21. ?

## 2022-01-22 NOTE — Telephone Encounter (Signed)
Requested Prescriptions  ?Pending Prescriptions Disp Refills  ?? gabapentin (NEURONTIN) 100 MG capsule [Pharmacy Med Name: GABAPENTIN 100 MG CAPSULE] 270 capsule 0  ?  Sig: TAKE 1 CAPSULE BY MOUTH THREE TIMES A DAY  ?  ? Neurology: Anticonvulsants - gabapentin Passed - 01/21/2022  8:52 AM  ?  ?  Passed - Cr in normal range and within 360 days  ?  Creat  ?Date Value Ref Range Status  ?06/19/2016 1.04 (H) 0.60 - 0.93 mg/dL Final  ?  Comment:  ?    ?For patients > or = 84 years of age: The upper reference limit for ?Creatinine is approximately 13% higher for people identified as ?African-American. ?  ?  ? ?Creatinine, Ser  ?Date Value Ref Range Status  ?11/30/2021 0.93 0.57 - 1.00 mg/dL Final  ?   ?  ?  Passed - Completed PHQ-2 or PHQ-9 in the last 360 days  ?  ?  Passed - Valid encounter within last 12 months  ?  Recent Outpatient Visits   ?      ? 2 months ago Essential hypertension  ? [facility] Marcine Matar, MD  ? 6 months ago Essential hypertension  ? [facility] Marcine Matar, MD  ? 9 months ago Gait disturbance  ? [facility] Marcine Matar, MD  ? 10 months ago Hospital discharge follow-up  ? [facility] Care at [facility] [place], NP  ? 1 year ago Essential hypertension  ? [facility] Marcine Matar, MD  ?  ?  ?Future Appointments   ?        ? In 2 months Marcine Matar, MD [facility]  ?  ? ?  ?  ?  ? ? ?

## 2022-03-28 ENCOUNTER — Encounter: Payer: Self-pay | Admitting: Internal Medicine

## 2022-03-28 ENCOUNTER — Ambulatory Visit: Payer: Medicare Other | Attending: Internal Medicine | Admitting: Internal Medicine

## 2022-03-28 VITALS — BP 150/70 | HR 63 | Temp 98.2°F | Resp 16 | Wt 182.0 lb

## 2022-03-28 DIAGNOSIS — D638 Anemia in other chronic diseases classified elsewhere: Secondary | ICD-10-CM | POA: Diagnosis not present

## 2022-03-28 DIAGNOSIS — Z23 Encounter for immunization: Secondary | ICD-10-CM

## 2022-03-28 DIAGNOSIS — E785 Hyperlipidemia, unspecified: Secondary | ICD-10-CM

## 2022-03-28 DIAGNOSIS — I10 Essential (primary) hypertension: Secondary | ICD-10-CM

## 2022-03-28 DIAGNOSIS — M170 Bilateral primary osteoarthritis of knee: Secondary | ICD-10-CM

## 2022-03-28 DIAGNOSIS — R269 Unspecified abnormalities of gait and mobility: Secondary | ICD-10-CM | POA: Diagnosis not present

## 2022-03-28 DIAGNOSIS — Z9181 History of falling: Secondary | ICD-10-CM

## 2022-03-28 DIAGNOSIS — N1831 Chronic kidney disease, stage 3a: Secondary | ICD-10-CM

## 2022-03-28 MED ORDER — FUROSEMIDE 20 MG PO TABS: ORAL_TABLET | ORAL | 0 refills | Status: DC

## 2022-03-28 NOTE — Progress Notes (Signed)
Patient ID: Anna [name], female    DOB: 08-Dec-1935  MRN: [id number]  CC: lab results   Subjective: Anna [name] is a 86 y.o. female who presents for chronic ds management.  Anna [name] is with her. Her concerns today include:  history of atrial flutter on Xarelto with history of cardioversion 2015, HTN, trivial AR, mild MR,OA knees, HL, prediabetes, CKD 3, ACD, lumbar radiculopathy, hx of vertebral fx receiving pain management through Dr. Joselyn Glassman at [facility].   OA knees/vertebral fx: She is now receiving PCS 4x/wk through the caps program Had fall in bathroom Mother's Day wkend.  Had walker and turned to reach something and LT knee gave out. Current walker not leveled any more, part of the rubber came off one of the legs.  Also the walker is bent.  Needs a new one; has had for many yrs.  Uses PWC in certain part of her house.  Lives in a trailer home, washer and dryer sticks out in the hallway so not able to get PWC past the washer and dryer.  So goes as far as the washer and dryer with her power wheelchair and then has to use a walker past that point. -taking Ca and a Vit D supplement daily.  Anna [name] does not recall the dosages that she is taking.  They have purchased over-the-counter.  CKD/anemia:  After last visit with me in January, she had blood test done that revealed drop in hemoglobin from 12.3-10.8.  Iron studies revealed ferritin 527, iron 66 and iron saturation of 19%.  TIBC 342.  I had called the patient and [name] and recommend that she restart iron supplement 325 mg daily.  Prescription was sent to her pharmacy.  Apparently patient restarted the iron but was taking it only 4 times a week. Saw nephrologist last wk and had blood test done.she was called and informed that her iron level was pretty low and the nephrologist recommended iron infusion which she is reluctant to have.  She does not want an IV in her arm and having to sit for an hour for the infusion.   No fatigue or  dizziness.  No blood in stools or black stools  HTN:  BP elevated today.  However Anna [name] tells me that her reading was good when she saw the nephrologist.   Took meds already for today Checks BP 3x/wk.  Range in 140s/does not recall bottom number No CP/SOB. +LE edema.  Nephrologist recommended fluid pill if swellig does not get better.  Did not want fluid pill because she urinates a lot already.  Patient reports that the swelling has decreased some.  She tries to keep the legs elevated.   Reports compliance with Crestor for her cholesterol.  She saw her cardiologist Dr. Mayford Knife 11/2021.  Xarelto changed to Eliquis because of decreased bleeding risks with the latter.  HM:  Thinks she had shingles vaccine [facility] on [address]  Due for PCV 23.  Declines Dexa  Patient Active Problem List   Diagnosis Date Noted   Mitral stenosis 05/07/2021   Recurrent falls 04/24/2021   History of vertebral fracture 04/24/2021   Lumbar radiculopathy 04/24/2021   Anemia due to stage 3a chronic kidney disease (HCC) 03/07/2020   Stage 3a chronic kidney disease (HCC) 03/07/2020   Mixed incontinence urge and stress 03/09/2018   Prediabetes 03/09/2018   OA (osteoarthritis) of knee 03/09/2018   Sinus bradycardia 12/18/2016   Estrogen deficiency 07/03/2016   Mitral regurgitation 03/01/2014  Atrial flutter ([place]) 03/01/2014   Permanent atrial fibrillation ([place]) 12/24/2013   Essential hypertension, benign 12/24/2013   Heart murmur 12/24/2013   Hyperlipemia 12/24/2013     Current Outpatient Medications on File Prior to Visit  Medication Sig Dispense Refill   amlodipine-olmesartan (AZOR) 10-20 MG tablet TAKE 1 TABLET BY MOUTH EVERY DAY 90 tablet 1   apixaban (ELIQUIS) 5 MG TABS tablet Take 1 tablet (5 mg total) by mouth 2 (two) times daily. 60 tablet 11   cyclobenzaprine (FLEXERIL) 10 MG tablet Take 0.5 tablets (5 mg total) by mouth 2 (two) times daily as needed for muscle spasms. 20 tablet 0   diclofenac  Sodium (VOLTAREN) 1 % GEL Apply 2 grams to knees 4 times a day as needed. 400 g 1   docusate sodium (COLACE) 100 MG capsule Take 100 mg by mouth daily.      ferrous sulfate 325 (65 FE) MG tablet Take 1 tablet (325 mg total) by mouth daily with breakfast. 100 tablet 1   gabapentin (NEURONTIN) 100 MG capsule TAKE 1 CAPSULE BY MOUTH THREE TIMES A DAY 270 capsule 0   HYDROcodone-acetaminophen (NORCO/VICODIN) 5-325 MG tablet Take 1 tablet by mouth every 6 (six) hours as needed for moderate pain.     metFORMIN (GLUCOPHAGE) 500 MG tablet TAKE 1 TABLET BY MOUTH EVERY DAY WITH BREAKFAST 90 tablet 1   Misc. Devices Forensic scientist. Devices MISC Rollator walker 1 Device 0   Misc. [facility] Hospital bed 1 Device 0   Omega-3 Fatty Acids (FISH OIL CONCENTRATE PO) Take 1 capsule by mouth daily.      rosuvastatin (CRESTOR) 10 MG tablet Take one tablet every Mon/Wed/Fri and Sat PO 16 tablet 5   No current facility-administered medications on file prior to visit.    No Known Allergies  Social History   Socioeconomic History   Marital status: Divorced    Spouse name: Not on file   Number of children: Not on file   Years of education: Not on file   Highest education level: Not on file  Occupational History   Not on file  Tobacco Use   Smoking status: Never   Smokeless tobacco: Never  Substance and Sexual Activity   Alcohol use: No   Drug use: No   Sexual activity: Not on file  Other Topics Concern   Not on file  Social History Narrative   Not on file   Social Determinants of Health   Financial Resource Strain: Low Risk    Difficulty of Paying Living Expenses: Not hard at all  Food Insecurity: Not on file  Transportation Needs: No Transportation Needs   Lack of Transportation (Medical): No   Lack of Transportation (Non-Medical): No  Physical Activity: Insufficiently Active   Days of Exercise per Week: 5 days   Minutes of Exercise per Session: 10 min  Stress:  Not on file  Social Connections: Not on file  Intimate Partner Violence: Not on file    Family History  Problem Relation Age of Onset   Alzheimer's disease Mother     Past Surgical History:  Procedure Laterality Date   CARDIOVERSION N/A 03/24/2014   Procedure: CARDIOVERSION;  Surgeon: Sueanne Margarita, MD;  Location: [facility];  Service: Cardiovascular;  Laterality: N/A;    ROS: Review of Systems Negative except as stated above  PHYSICAL EXAM: BP (!) 150/70   Pulse 63   Temp 98.2 F (36.8 C)   Resp 16  Wt 182 lb (82.6 kg)   SpO2 98%   BMI 33.29 kg/m   Physical Exam BP 150/70 General appearance - alert, well appearing, pleasant elderly Caucasian female sitting in wheelchair and in no distress.  I examined her in the wheelchair. Mental status - normal mood, behavior, speech, dress, motor activity, and thought processes Neck - supple, no significant adenopathy Chest - clear to auscultation, no wheezes, rales or rhonchi, symmetric air entry Heart -heart rate is irregularly irregular but rate controlled. Extremities -trace to 1+ edema both legs ankles and feet. MSK: She has knee support/Velcro on both knees.     Latest Ref Rng & Units 12/07/2021    2:57 PM 11/30/2021    2:37 PM 03/19/2021    2:09 PM  CMP  Glucose 70 - 99 mg/dL  941   740    BUN 8 - 27 mg/dL  17   22    Creatinine 0.57 - 1.00 mg/dL  8.14   4.81    Sodium 134 - 144 mmol/L  137   137    Potassium 3.5 - 5.2 mmol/L 4.7   5.3   4.2    Chloride 96 - 106 mmol/L  101   103    CO2 20 - 29 mmol/L  24   25    Calcium 8.7 - 10.3 mg/dL  9.8   9.9    Total Protein 6.0 - 8.5 g/dL  6.7     Total Bilirubin 0.0 - 1.2 mg/dL  0.6     Alkaline Phos 44 - 121 IU/L  190     AST 0 - 40 IU/L  33     ALT 0 - 32 IU/L  44      Lipid Panel     Component Value Date/Time   CHOL 108 07/07/2020 1421   TRIG 67 07/07/2020 1421   HDL 47 07/07/2020 1421   CHOLHDL 2.3 07/07/2020 1421   CHOLHDL 1.9 06/29/2015 0945   VLDL 14  06/29/2015 0945   LDLCALC 47 07/07/2020 1421    CBC    Component Value Date/Time   WBC 5.3 11/30/2021 1437   WBC 7.8 03/19/2021 1409   RBC 3.76 (L) 11/30/2021 1437   RBC 4.28 03/19/2021 1409   HGB 10.8 (L) 11/30/2021 1437   HCT 32.8 (L) 11/30/2021 1437   PLT 266 11/30/2021 1437   MCV 87 11/30/2021 1437   MCH 28.7 11/30/2021 1437   MCH 28.7 03/19/2021 1409   MCHC 32.9 11/30/2021 1437   MCHC 32.2 03/19/2021 1409   RDW 12.6 11/30/2021 1437   LYMPHSABS 1.9 12/18/2016 1452   MONOABS 378 06/19/2016 1503   EOSABS 0.2 12/18/2016 1452   BASOSABS 0.1 12/18/2016 1452    ASSESSMENT AND PLAN: 1. Essential hypertension Not at goal but acceptable for her age.  Blood pressure lower at home.  She will continue the Azor and low-salt diet. - furosemide (LASIX) 20 MG tablet; 1/2 tab PO twice as needed for swelling in the legs.  Dispense: 30 tablet; Refill: 0  2. Anemia, chronic disease Advised patient that if the nephrologist said that her iron level and numbers were low enough that iron infusion recommended, I recommend that she get the infusion.  Patient still declines.  I recommend that she takes oral iron supplement daily per our conversation 11/2021.  [name] request blood count and iron level be checked today since blood was being draw for LFT/lipid - CBC - Iron, TIBC and Ferritin Panel  3. Hyperlipidemia, unspecified  hyperlipidemia type Continue Crestor - Hepatic Function Panel - Lipid panel  4. Gait disturbance Recommend home physical therapy for safety training.  Patient declines.  Prescription given for standard walker to replace the old one that she has. - For home use only DME Other see comment  5. History of recent fall See #4 above - For home use only DME Other see comment  6. Need for vaccination against Streptococcus pneumoniae PCV 23 given today  7. Primary osteoarthritis of both knees See #4 above  8. Stage 3a chronic kidney disease ([place]) Followed by nephrology.  I  will await the note from her most recent visit with them last week.  Advised patient on [name] that she should be taking calcium 600 mg twice a day and vitamin D 800 mg IU once a day.  [name] said she will check to see the doses that she currently has at home to make sure this is what she is taking. Patient was given the opportunity to ask questions.  Patient verbalized understanding of the plan and was able to repeat key elements of the plan.   This documentation was completed using Radio producer.  Any transcriptional errors are unintentional.  Orders Placed This Encounter  Procedures   For home use only DME Other see comment   Pneumococcal polysaccharide vaccine 23-valent greater than or equal to 2yo subcutaneous/IM   Hepatic Function Panel   Lipid panel   CBC   Iron, TIBC and Ferritin Panel     Requested Prescriptions   Signed Prescriptions Disp Refills   furosemide (LASIX) 20 MG tablet 30 tablet 0    Sig: 1/2 tab PO twice as needed for swelling in the legs.    No follow-ups on file.  Karle Plumber, MD, FACP

## 2022-03-28 NOTE — Progress Notes (Signed)
Discuss results regarding Fe/TIBC and infusion

## 2022-03-29 ENCOUNTER — Telehealth: Payer: Self-pay | Admitting: Internal Medicine

## 2022-03-29 LAB — HEPATIC FUNCTION PANEL
ALT: 10 IU/L (ref 0–32)
AST: 17 IU/L (ref 0–40)
Albumin: 4.5 g/dL (ref 3.6–4.6)
Alkaline Phosphatase: 110 IU/L (ref 44–121)
Bilirubin Total: 0.4 mg/dL (ref 0.0–1.2)
Bilirubin, Direct: 0.19 mg/dL (ref 0.00–0.40)
Total Protein: 7.2 g/dL (ref 6.0–8.5)

## 2022-03-29 LAB — CBC
Hematocrit: 35.6 % (ref 34.0–46.6)
Hemoglobin: 11.6 g/dL (ref 11.1–15.9)
MCH: 28.7 pg (ref 26.6–33.0)
MCHC: 32.6 g/dL (ref 31.5–35.7)
MCV: 88 fL (ref 79–97)
Platelets: 253 10*3/uL (ref 150–450)
RBC: 4.04 x10E6/uL (ref 3.77–5.28)
RDW: 12.7 % (ref 11.7–15.4)
WBC: 5.2 10*3/uL (ref 3.4–10.8)

## 2022-03-29 LAB — IRON,TIBC AND FERRITIN PANEL
Ferritin: 507 ng/mL — ABNORMAL HIGH (ref 15–150)
Iron Saturation: 22 % (ref 15–55)
Iron: 87 ug/dL (ref 27–139)
Total Iron Binding Capacity: 400 ug/dL (ref 250–450)
UIBC: 313 ug/dL (ref 118–369)

## 2022-03-29 LAB — LIPID PANEL
Chol/HDL Ratio: 2.2 ratio (ref 0.0–4.4)
Cholesterol, Total: 106 mg/dL (ref 100–199)
HDL: 49 mg/dL (ref >39)
LDL Chol Calc (NIH): 44 mg/dL (ref 0–99)
Triglycerides: 58 mg/dL (ref 0–149)
VLDL Cholesterol Cal: 13 mg/dL (ref 5–40)

## 2022-03-29 NOTE — Telephone Encounter (Signed)
Copied from CRM [id number]. Topic: General - Other >> Mar 29, 2022 12:41 PM Pawlus, [name] wrote: Reason for CRM: Pt called in and is needing her Rx changed, pt is needing a wheeled walker sent to [facility] Fax [phone].

## 2022-03-29 NOTE — Telephone Encounter (Signed)
Copied from CRM [id number]. Topic: General - Other >> Mar 29, 2022  4:11 PM Donnelly Angelica R wrote: Reason for CRM: Pts granddaughter checking status of 03-28-2022 lab results. Please advise caller

## 2022-03-31 NOTE — Progress Notes (Signed)
Let pt and her grand-daughter [name] know that her blood cell count and iron levels have improve.  She can hold off on the iron infusion. Liver function tests have normalized. Cholesterol levels are normal.

## 2022-04-01 NOTE — Telephone Encounter (Signed)
Tanya (pt's granddaughter)called to f/u if fax was sent to [facility] for a wheeled walker. Routing note to office. Tanya asking for this to be dome today.

## 2022-04-01 NOTE — Telephone Encounter (Signed)
Pt given lab results per notes of Dr. Wynetta Emery on 04/01/22. Pt verbalized understanding. Please cancel iron infusion appt.

## 2022-04-02 NOTE — Telephone Encounter (Signed)
Called and left a vm, wanting to verify before I cancel appt

## 2022-04-02 NOTE — Telephone Encounter (Signed)
Duplicated msg

## 2022-04-03 ENCOUNTER — Other Ambulatory Visit (HOSPITAL_COMMUNITY): Payer: Self-pay | Admitting: *Deleted

## 2022-04-03 ENCOUNTER — Encounter: Payer: Self-pay | Admitting: Internal Medicine

## 2022-04-03 NOTE — Progress Notes (Signed)
I received note from [facility].  Patient was seen 03/18/2022 by NP Juanell Fairly. Blood pressure on that visit was 140/70. Creatinine 0.88, GFR 65, potassium 4.3 Urinalysis: Protein/creatinine ratio 377 H/H 10.9/33 Iron 33/iron saturation 9%/TIBC 385/ferritin 532 Intact PTH 39 25 hydroxy vitamin D 50  I note that H/H, iron level and iron saturation levels have improved on blood test that I did 03/28/2022.

## 2022-04-04 ENCOUNTER — Encounter (HOSPITAL_COMMUNITY): Payer: Medicare Other

## 2022-04-08 ENCOUNTER — Telehealth: Payer: Self-pay | Admitting: Internal Medicine

## 2022-04-08 DIAGNOSIS — M170 Bilateral primary osteoarthritis of knee: Secondary | ICD-10-CM

## 2022-04-08 DIAGNOSIS — R269 Unspecified abnormalities of gait and mobility: Secondary | ICD-10-CM

## 2022-04-08 NOTE — Telephone Encounter (Signed)
Patient grand daughter came in saying that her grand mother (patient) needs a rolling/wheel walker instead of the standard walker. She requested if the order be faxed to [place].

## 2022-04-10 ENCOUNTER — Telehealth: Payer: Self-pay | Admitting: Internal Medicine

## 2022-04-10 NOTE — Telephone Encounter (Signed)
-----   Message from Drucilla Chalet, RPH-CPP sent at 03/28/2022  8:47 PM EDT ----- Called the [facility] and looked up the vaccine registry. She got Zostavax on 02/28/2015. Recommend to revaccinate with Shingrix if patient is amenable. ----- Message ----- From: Marcine Matar, MD Sent: 03/28/2022   6:00 PM EDT To: Drucilla Chalet, RPH-CPP  This patient thinks she had the shingles vaccine at [facility]  Can you please call and get the information?

## 2022-04-12 ENCOUNTER — Encounter (HOSPITAL_COMMUNITY): Payer: Medicare Other

## 2022-04-12 NOTE — Telephone Encounter (Signed)
Order has been faxed

## 2022-04-19 ENCOUNTER — Other Ambulatory Visit: Payer: Self-pay | Admitting: Internal Medicine

## 2022-04-19 DIAGNOSIS — I10 Essential (primary) hypertension: Secondary | ICD-10-CM

## 2022-05-06 ENCOUNTER — Ambulatory Visit (HOSPITAL_COMMUNITY)
Admission: RE | Admit: 2022-05-06 | Discharge: 2022-05-06 | Disposition: A | Discharge status: Home / Self Care | Payer: Medicare Other | Source: Ambulatory Visit | Attending: Cardiology | Admitting: Cardiology

## 2022-05-06 ENCOUNTER — Encounter: Payer: Self-pay | Admitting: Cardiology

## 2022-05-06 DIAGNOSIS — I4821 Permanent atrial fibrillation: Secondary | ICD-10-CM

## 2022-05-06 DIAGNOSIS — I342 Nonrheumatic mitral (valve) stenosis: Secondary | ICD-10-CM

## 2022-05-06 DIAGNOSIS — I340 Nonrheumatic mitral (valve) insufficiency: Secondary | ICD-10-CM | POA: Diagnosis not present

## 2022-05-06 DIAGNOSIS — I050 Rheumatic mitral stenosis: Secondary | ICD-10-CM | POA: Diagnosis not present

## 2022-05-06 DIAGNOSIS — I10 Essential (primary) hypertension: Secondary | ICD-10-CM | POA: Insufficient documentation

## 2022-05-06 LAB — ECHOCARDIOGRAM COMPLETE
MV VTI: 1.41 cm2
S' Lateral: 3 cm

## 2022-05-06 NOTE — Progress Notes (Signed)
  Echocardiogram 2D Echocardiogram has been performed.  Delcie Roch 05/06/2022, 4:05 PM

## 2022-05-07 ENCOUNTER — Telehealth: Payer: Self-pay

## 2022-05-07 DIAGNOSIS — I050 Rheumatic mitral stenosis: Secondary | ICD-10-CM

## 2022-05-07 NOTE — Telephone Encounter (Signed)
-----   Message from Quintella Reichert, MD sent at 05/06/2022  7:48 PM EDT ----- Echo showed normal heart function with moderately enarged LA, mild to moderate MR, mild MS, mildly calcified AV with no AS - repeat echo in 1 year for MR and MS

## 2022-05-07 NOTE — Telephone Encounter (Signed)
The patient's granddaughter has been notified of the result and verbalized understanding.  All questions (if any) were answered. Theresia Majors, RN 05/07/2022 5:02 PM

## 2022-05-14 ENCOUNTER — Other Ambulatory Visit: Payer: Self-pay | Admitting: Internal Medicine

## 2022-05-14 ENCOUNTER — Other Ambulatory Visit: Payer: Self-pay | Admitting: Cardiology

## 2022-05-14 DIAGNOSIS — I10 Essential (primary) hypertension: Secondary | ICD-10-CM

## 2022-05-14 DIAGNOSIS — I4892 Unspecified atrial flutter: Secondary | ICD-10-CM

## 2022-05-14 NOTE — Telephone Encounter (Signed)
Xarelto 20mg  refill request received. Pt is 86 years old, weight-82.6kg, Crea-0.93 on 11/30/2021, last seen by Dr. 01/28/2022 on 2/7/20223, Diagnosis-Afib, CrCl-58.45ml/min; Dose is appropriate based on dosing criteria. Will send in refill to requested pharmacy.

## 2022-08-09 IMAGING — DX DG LUMBAR SPINE COMPLETE 4+V
6 series · 6 of 6 positions shown · non-contrast
Comparison: None.

CLINICAL DATA: Back pain

EXAM:
LUMBAR SPINE - COMPLETE 4+ VIEW

[l-spine ap (1 of 2)]
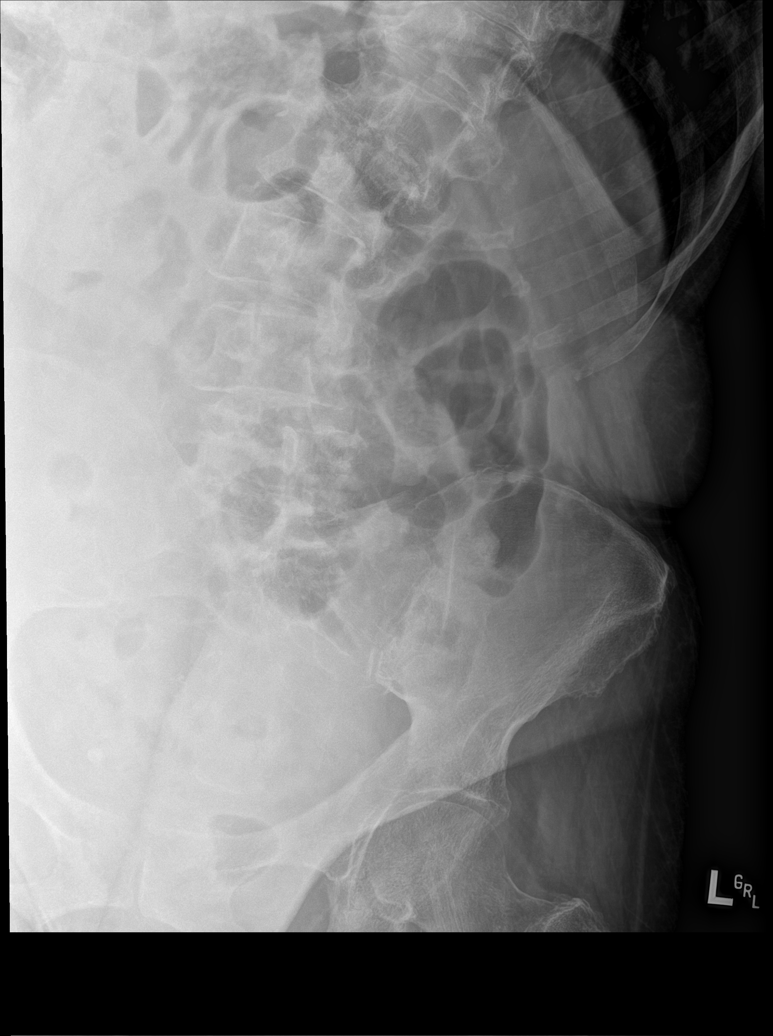

[l-spine obl (1 of 2)]
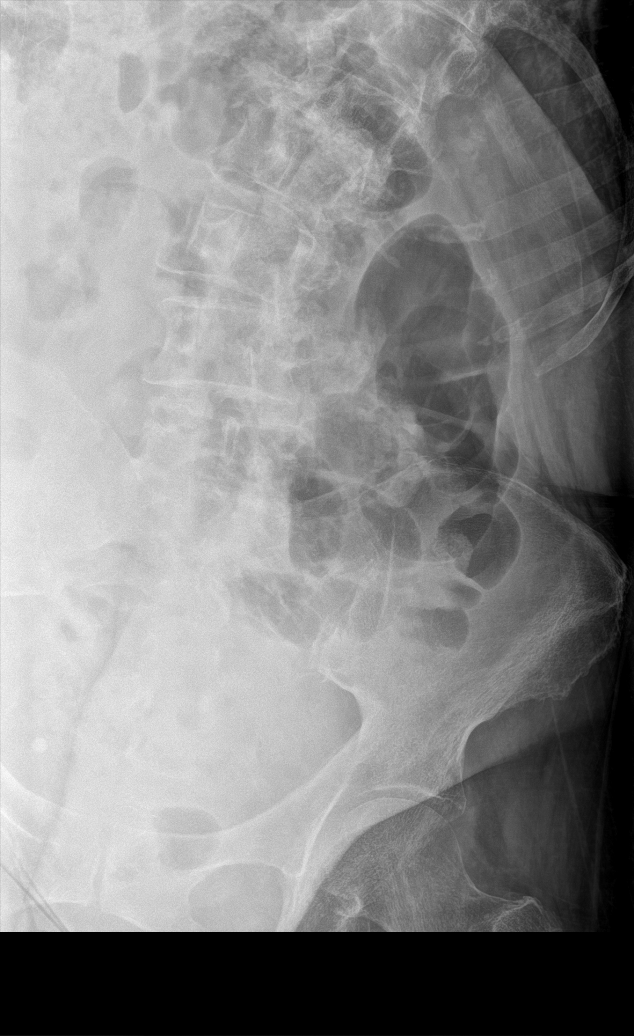

[l-spine obl (2 of 2)]
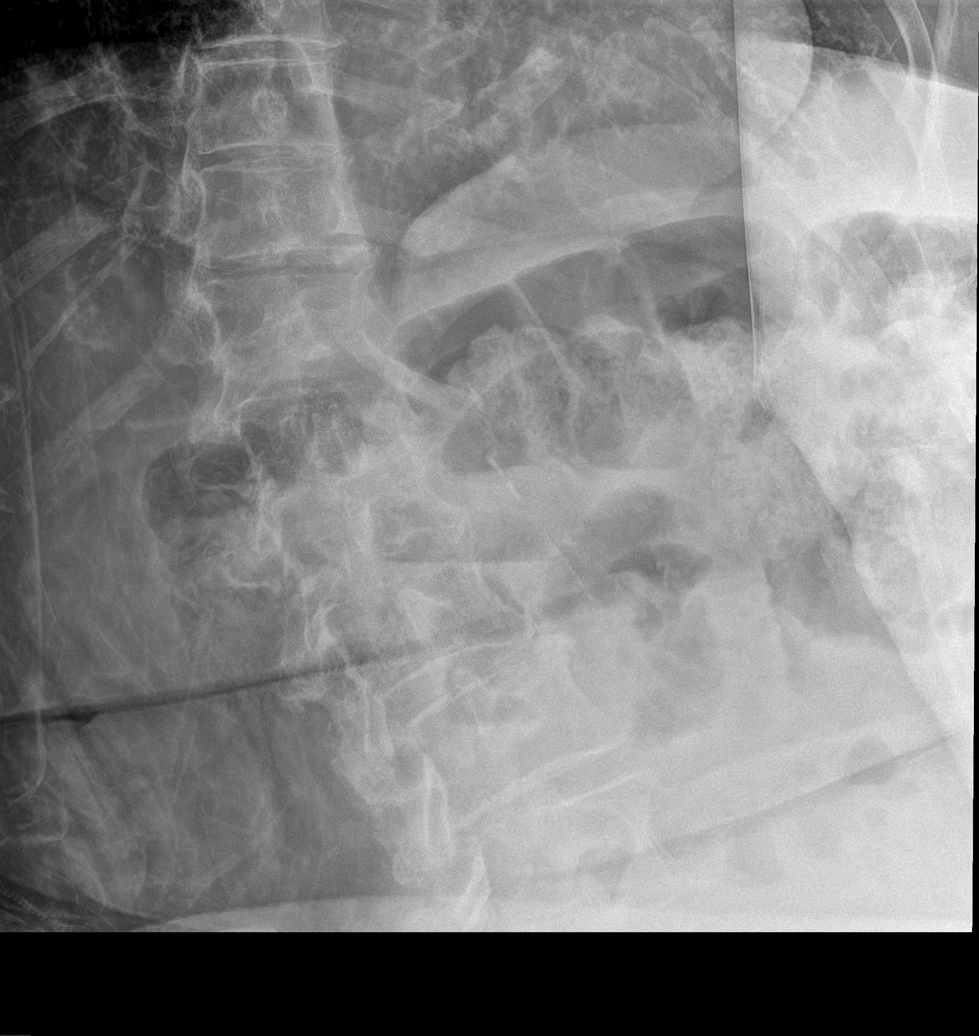

[l-spine lat (1 of 2)]
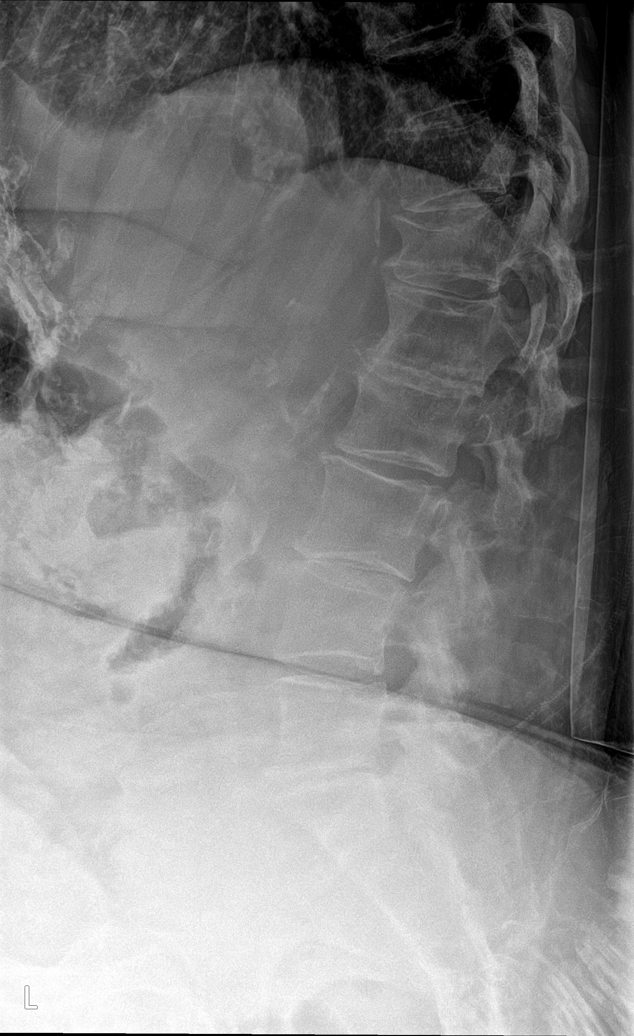

[l-spine lat (2 of 2)]
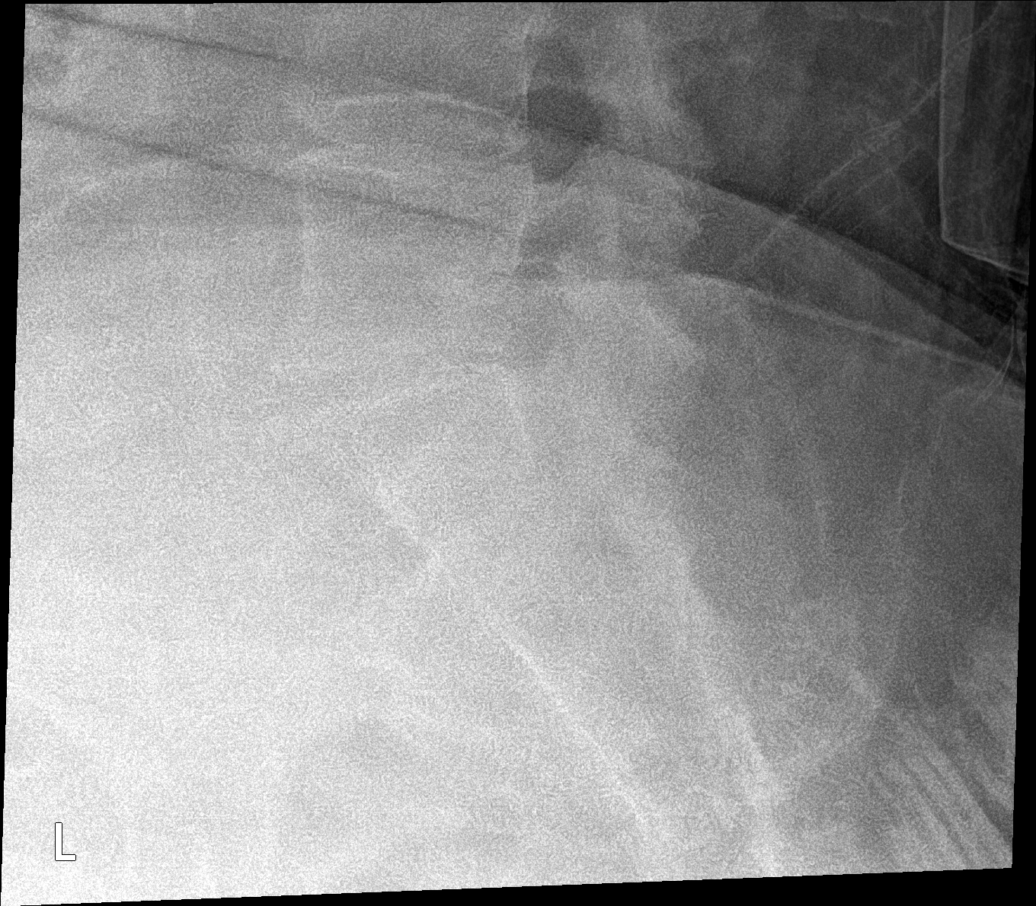

[l-spine ap (2 of 2)]
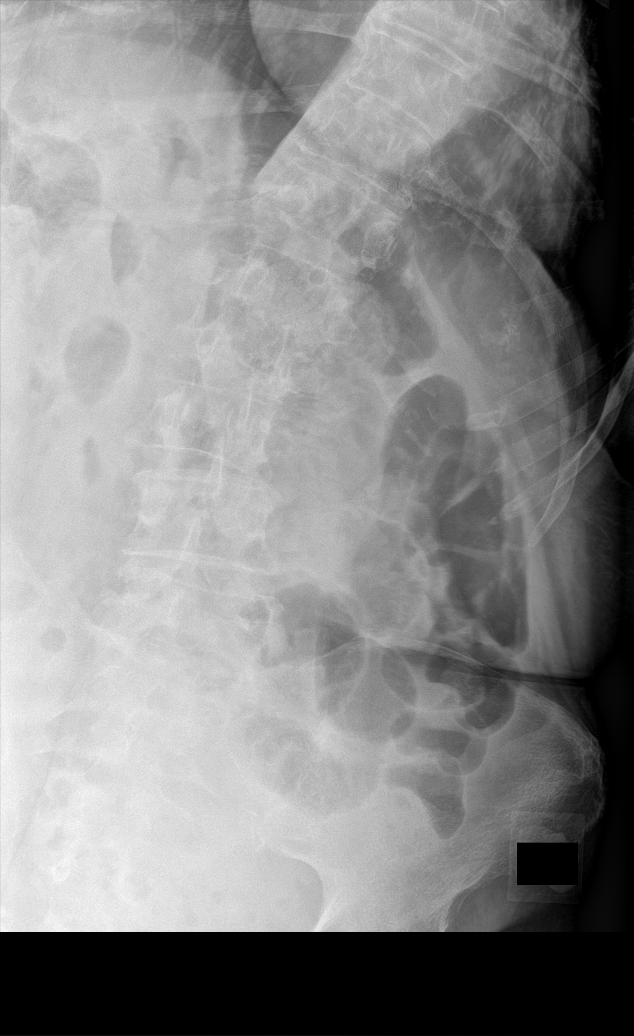

[6 of 6 positions shown; findings below may reference images not displayed]

FINDINGS: Compression deformity of T12, age indeterminate. Right convex
scoliosis. Multilevel mild degenerative disc disease.
IMPRESSION: 1. Age-indeterminate T12 compression fracture. MRI may provide
better temporal characterization.
2. Right convex scoliosis and mild multilevel degenerative disc
disease of the lumbar spine.

## 2022-08-13 IMAGING — CR DG CHEST 2V
2 series · 2 of 2 positions shown · non-contrast
Comparison: None

CLINICAL DATA: Weakness shortness of breath chest pain cough and
congestion.

EXAM:
CHEST - 2 VIEW

[chest lat]
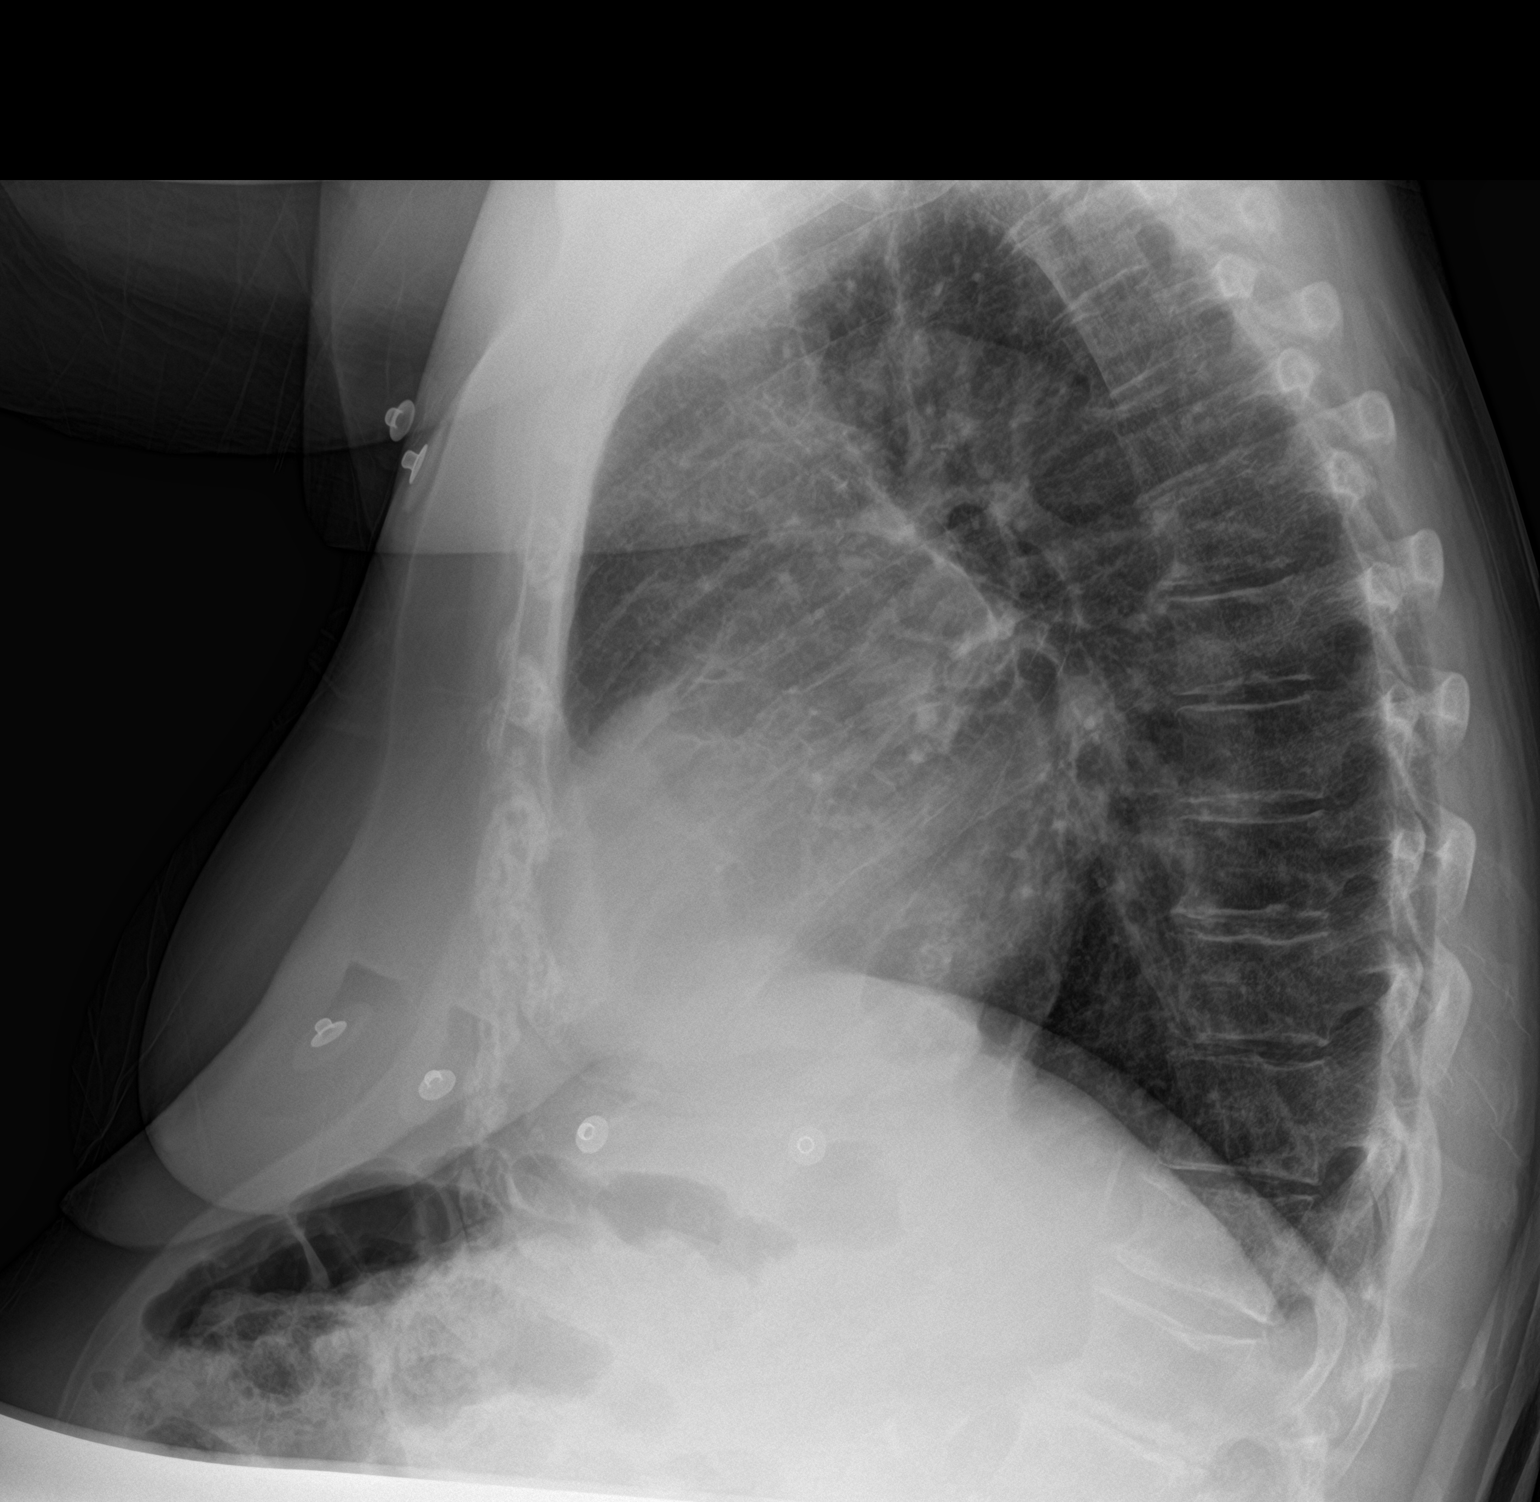

[chest ap]
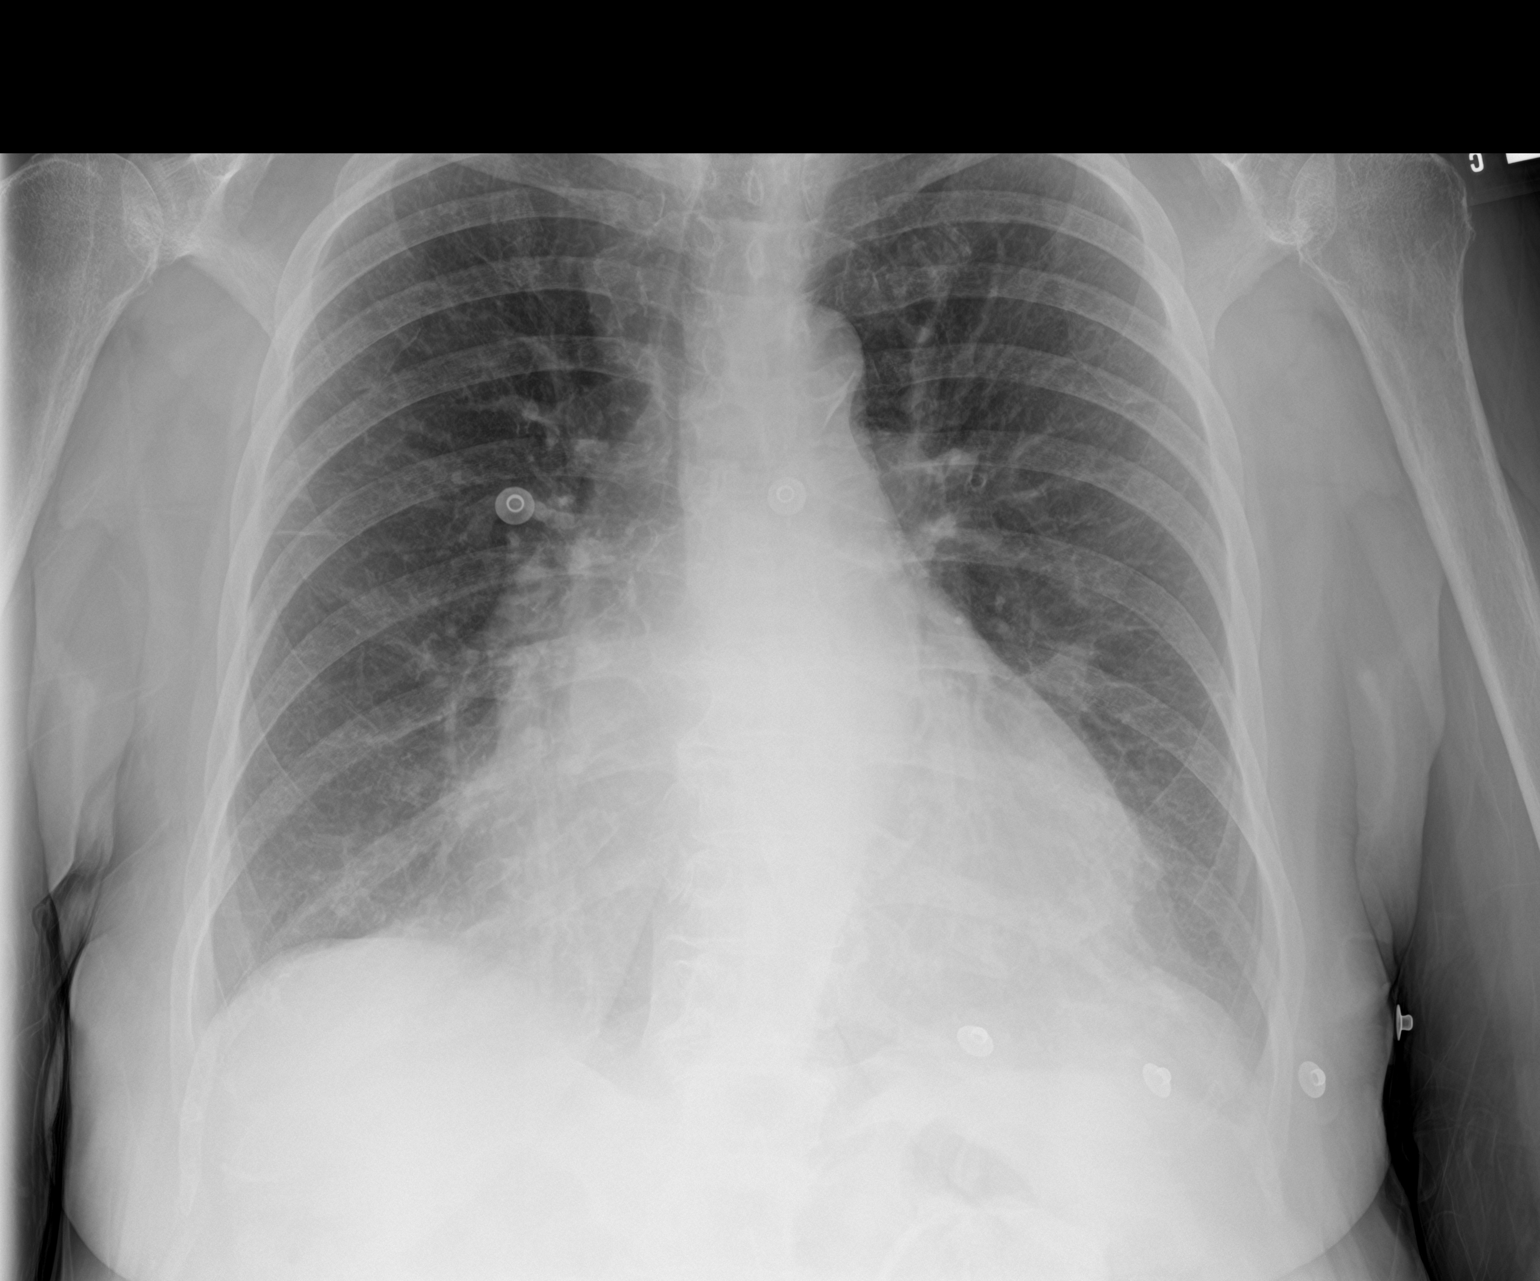

[2 of 2 positions shown; findings below may reference images not displayed]

FINDINGS: Heart size is enlarged. The central vascular engorgement is noted.
Nodular density projecting along the LEFT heart border measuring up
to 13 mm.

No lobar consolidation.  No sign of pleural effusion.

On limited assessment no acute skeletal process.
IMPRESSION: 1. Cardiomegaly with central vascular engorgement, no signs of
edema, effusion or lobar consolidation.
2. Nodular density projecting along the LEFT heart border. Consider
non emergent follow-up chest CT for further evaluation or PA and
lateral chest if there are symptoms of infection.

## 2022-08-15 ENCOUNTER — Other Ambulatory Visit: Payer: Self-pay | Admitting: Cardiology

## 2022-08-16 ENCOUNTER — Ambulatory Visit: Payer: Self-pay

## 2022-08-16 NOTE — Telephone Encounter (Signed)
  Chief Complaint: cough Symptoms: cough, congestion, SOB at times, dizziness, weakness  Frequency: 2 weeks  Pertinent Negatives: Patient denies fever Disposition: [] ED /[x] Urgent Care (no appt availability in office) / [] Appointment(In office/virtual)/ []  [facility]/ [] Home Care/ [] Refused Recommended Disposition /[] [place] [facility]/ []  Follow-up with PCP Additional Notes: [name] pt's granddaugher calling to report pt's sx. They had cold previously and got better but pt not seem to get any better. She has been giving Robitussin and Nyquil. Advised no appts with practice and offered UC. Scheduled appt for tomorrow at 1500 so that pt didn't have to get out in weather today. Advised to keep giving medications as well.   Summary: Cough and dizziness   Patient's granddaughter states that patient has ad a bad cough and dizziness for about a week. Patient is coughing so bad at night that she is not resting. Patient's granddaughter states patient has been taking robitussin and dayquil.      Reason for Disposition  [1] Continuous (nonstop) coughing interferes with work or school AND [2] no improvement using cough treatment per Care Advice  Answer Assessment - Initial Assessment Questions 1. ONSET: "When did the cough begin?"      Tuesday last week  2. SEVERITY: "How bad is the cough today?"      Moderate  5. DIFFICULTY BREATHING: "Are you having difficulty breathing?" If Yes, ask: "How bad is it?" (e.g., mild, moderate, severe)    - MILD: No SOB at rest, mild SOB with walking, speaks normally in sentences, can lie down, no retractions, pulse < 100.    - MODERATE: SOB at rest, SOB with minimal exertion and prefers to sit, cannot lie down flat, speaks in phrases, mild retractions, audible wheezing, pulse 100-120.    - SEVERE: Very SOB at rest, speaks in single words, struggling to breathe, sitting hunched forward, retractions, pulse > 120      Mild at times  6. FEVER: "Do you have a  fever?" If Yes, ask: "What is your temperature, how was it measured, and when did it start?"     no 10. OTHER SYMPTOMS: "Do you have any other symptoms?" (e.g., runny nose, wheezing, chest pain)       Dizziness, congestion, weakness  Protocols used: Cough - Acute Productive-A-AH

## 2022-08-17 ENCOUNTER — Inpatient Hospital Stay (HOSPITAL_COMMUNITY): Payer: Medicare Other

## 2022-08-17 ENCOUNTER — Emergency Department (HOSPITAL_COMMUNITY): Payer: Medicare Other

## 2022-08-17 ENCOUNTER — Other Ambulatory Visit: Payer: Self-pay

## 2022-08-17 ENCOUNTER — Inpatient Hospital Stay (HOSPITAL_COMMUNITY)
Admission: EM | Admit: 2022-08-17 | Discharge: 2022-08-24 | DRG: 280 | Disposition: A | Discharge status: Home / Self Care | Payer: Medicare Other | Attending: Family Medicine | Admitting: Family Medicine

## 2022-08-17 ENCOUNTER — Encounter (HOSPITAL_COMMUNITY): Payer: Self-pay

## 2022-08-17 DIAGNOSIS — R7989 Other specified abnormal findings of blood chemistry: Secondary | ICD-10-CM | POA: Diagnosis not present

## 2022-08-17 DIAGNOSIS — I4821 Permanent atrial fibrillation: Secondary | ICD-10-CM | POA: Diagnosis not present

## 2022-08-17 DIAGNOSIS — Z1152 Encounter for screening for COVID-19: Secondary | ICD-10-CM | POA: Diagnosis not present

## 2022-08-17 DIAGNOSIS — I214 Non-ST elevation (NSTEMI) myocardial infarction: Secondary | ICD-10-CM

## 2022-08-17 DIAGNOSIS — J440 Chronic obstructive pulmonary disease with acute lower respiratory infection: Secondary | ICD-10-CM | POA: Diagnosis not present

## 2022-08-17 DIAGNOSIS — I4892 Unspecified atrial flutter: Secondary | ICD-10-CM

## 2022-08-17 DIAGNOSIS — N838 Other noninflammatory disorders of ovary, fallopian tube and broad ligament: Secondary | ICD-10-CM | POA: Diagnosis present

## 2022-08-17 DIAGNOSIS — Z7189 Other specified counseling: Secondary | ICD-10-CM | POA: Diagnosis not present

## 2022-08-17 DIAGNOSIS — J189 Pneumonia, unspecified organism: Secondary | ICD-10-CM | POA: Diagnosis present

## 2022-08-17 DIAGNOSIS — E785 Hyperlipidemia, unspecified: Secondary | ICD-10-CM | POA: Diagnosis present

## 2022-08-17 DIAGNOSIS — I420 Dilated cardiomyopathy: Secondary | ICD-10-CM | POA: Diagnosis not present

## 2022-08-17 DIAGNOSIS — Z515 Encounter for palliative care: Secondary | ICD-10-CM | POA: Diagnosis not present

## 2022-08-17 DIAGNOSIS — I21A1 Myocardial infarction type 2: Secondary | ICD-10-CM | POA: Diagnosis not present

## 2022-08-17 DIAGNOSIS — I255 Ischemic cardiomyopathy: Secondary | ICD-10-CM | POA: Diagnosis present

## 2022-08-17 DIAGNOSIS — Z820 Family history of epilepsy and other diseases of the nervous system: Secondary | ICD-10-CM

## 2022-08-17 DIAGNOSIS — E1165 Type 2 diabetes mellitus with hyperglycemia: Secondary | ICD-10-CM | POA: Diagnosis present

## 2022-08-17 DIAGNOSIS — K921 Melena: Secondary | ICD-10-CM | POA: Diagnosis present

## 2022-08-17 DIAGNOSIS — T508X5A Adverse effect of diagnostic agents, initial encounter: Secondary | ICD-10-CM | POA: Diagnosis not present

## 2022-08-17 DIAGNOSIS — D649 Anemia, unspecified: Secondary | ICD-10-CM

## 2022-08-17 DIAGNOSIS — R590 Localized enlarged lymph nodes: Secondary | ICD-10-CM | POA: Diagnosis present

## 2022-08-17 DIAGNOSIS — Z7901 Long term (current) use of anticoagulants: Secondary | ICD-10-CM

## 2022-08-17 DIAGNOSIS — T380X5A Adverse effect of glucocorticoids and synthetic analogues, initial encounter: Secondary | ICD-10-CM | POA: Diagnosis not present

## 2022-08-17 DIAGNOSIS — I5021 Acute systolic (congestive) heart failure: Secondary | ICD-10-CM | POA: Diagnosis not present

## 2022-08-17 DIAGNOSIS — E871 Hypo-osmolality and hyponatremia: Secondary | ICD-10-CM | POA: Diagnosis not present

## 2022-08-17 DIAGNOSIS — Z7984 Long term (current) use of oral hypoglycemic drugs: Secondary | ICD-10-CM

## 2022-08-17 DIAGNOSIS — I129 Hypertensive chronic kidney disease with stage 1 through stage 4 chronic kidney disease, or unspecified chronic kidney disease: Secondary | ICD-10-CM | POA: Diagnosis present

## 2022-08-17 DIAGNOSIS — Z79899 Other long term (current) drug therapy: Secondary | ICD-10-CM

## 2022-08-17 DIAGNOSIS — E1122 Type 2 diabetes mellitus with diabetic chronic kidney disease: Secondary | ICD-10-CM | POA: Diagnosis present

## 2022-08-17 DIAGNOSIS — J9601 Acute respiratory failure with hypoxia: Secondary | ICD-10-CM | POA: Diagnosis present

## 2022-08-17 DIAGNOSIS — I2510 Atherosclerotic heart disease of native coronary artery without angina pectoris: Secondary | ICD-10-CM | POA: Diagnosis present

## 2022-08-17 DIAGNOSIS — N1831 Chronic kidney disease, stage 3a: Secondary | ICD-10-CM | POA: Diagnosis present

## 2022-08-17 DIAGNOSIS — N179 Acute kidney failure, unspecified: Secondary | ICD-10-CM | POA: Diagnosis not present

## 2022-08-17 DIAGNOSIS — I340 Nonrheumatic mitral (valve) insufficiency: Secondary | ICD-10-CM | POA: Diagnosis present

## 2022-08-17 DIAGNOSIS — I5181 Takotsubo syndrome: Principal | ICD-10-CM | POA: Diagnosis present

## 2022-08-17 DIAGNOSIS — K5900 Constipation, unspecified: Secondary | ICD-10-CM | POA: Diagnosis present

## 2022-08-17 DIAGNOSIS — I509 Heart failure, unspecified: Secondary | ICD-10-CM

## 2022-08-17 DIAGNOSIS — R918 Other nonspecific abnormal finding of lung field: Secondary | ICD-10-CM | POA: Diagnosis present

## 2022-08-17 DIAGNOSIS — D631 Anemia in chronic kidney disease: Secondary | ICD-10-CM | POA: Diagnosis present

## 2022-08-17 DIAGNOSIS — I484 Atypical atrial flutter: Secondary | ICD-10-CM | POA: Diagnosis not present

## 2022-08-17 DIAGNOSIS — N1411 Contrast-induced nephropathy: Secondary | ICD-10-CM | POA: Diagnosis not present

## 2022-08-17 DIAGNOSIS — R079 Chest pain, unspecified: Secondary | ICD-10-CM | POA: Diagnosis not present

## 2022-08-17 LAB — CBC WITH DIFFERENTIAL/PLATELET
Abs Immature Granulocytes: 0.04 10*3/uL (ref 0.00–0.07)
Basophils Absolute: 0.1 10*3/uL (ref 0.0–0.1)
Basophils Relative: 0 %
Eosinophils Absolute: 0 10*3/uL (ref 0.0–0.5)
Eosinophils Relative: 0 %
HCT: 33 % — ABNORMAL LOW (ref 36.0–46.0)
Hemoglobin: 10.5 g/dL — ABNORMAL LOW (ref 12.0–15.0)
Immature Granulocytes: 0 %
Lymphocytes Relative: 8 %
Lymphs Abs: 1 10*3/uL (ref 0.7–4.0)
MCH: 28.8 pg (ref 26.0–34.0)
MCHC: 31.8 g/dL (ref 30.0–36.0)
MCV: 90.7 fL (ref 80.0–100.0)
Monocytes Absolute: 0.8 10*3/uL (ref 0.1–1.0)
Monocytes Relative: 7 %
Neutro Abs: 10.6 10*3/uL — ABNORMAL HIGH (ref 1.7–7.7)
Neutrophils Relative %: 85 %
Platelets: 263 10*3/uL (ref 150–400)
RBC: 3.64 MIL/uL — ABNORMAL LOW (ref 3.87–5.11)
RDW: 13.2 % (ref 11.5–15.5)
WBC: 12.5 10*3/uL — ABNORMAL HIGH (ref 4.0–10.5)
nRBC: 0 % (ref 0.0–0.2)

## 2022-08-17 LAB — COMPREHENSIVE METABOLIC PANEL
ALT: 13 U/L (ref 0–44)
AST: 24 U/L (ref 15–41)
Albumin: 3.4 g/dL — ABNORMAL LOW (ref 3.5–5.0)
Alkaline Phosphatase: 114 U/L (ref 38–126)
Anion gap: 12 (ref 5–15)
BUN: 13 mg/dL (ref 8–23)
CO2: 25 mmol/L (ref 22–32)
Calcium: 9.4 mg/dL (ref 8.9–10.3)
Chloride: 99 mmol/L (ref 98–111)
Creatinine, Ser: 0.89 mg/dL (ref 0.44–1.00)
GFR, Estimated: >60 mL/min (ref >60)
Glucose, Bld: 152 mg/dL — ABNORMAL HIGH (ref 70–99)
Potassium: 4.1 mmol/L (ref 3.5–5.1)
Sodium: 136 mmol/L (ref 135–145)
Total Bilirubin: 0.9 mg/dL (ref 0.3–1.2)
Total Protein: 6.9 g/dL (ref 6.5–8.1)

## 2022-08-17 LAB — RESP PANEL BY RT-PCR (FLU A&B, COVID) ARPGX2
Influenza A by PCR: NEGATIVE
Influenza B by PCR: NEGATIVE
SARS Coronavirus 2 by RT PCR: NEGATIVE

## 2022-08-17 LAB — TROPONIN I (HIGH SENSITIVITY)
Troponin I (High Sensitivity): 158 ng/L (ref <18)
Troponin I (High Sensitivity): 2067 ng/L (ref <18)

## 2022-08-17 LAB — POC OCCULT BLOOD, ED: Fecal Occult Bld: NEGATIVE

## 2022-08-17 LAB — GLUCOSE, CAPILLARY: Glucose-Capillary: 232 mg/dL — ABNORMAL HIGH (ref 70–99)

## 2022-08-17 LAB — LACTIC ACID, PLASMA: Lactic Acid, Venous: 2.3 mmol/L (ref 0.5–1.9)

## 2022-08-17 LAB — BRAIN NATRIURETIC PEPTIDE: B Natriuretic Peptide: 697.2 pg/mL — ABNORMAL HIGH (ref 0.0–100.0)

## 2022-08-17 MED ORDER — ACETAMINOPHEN 325 MG PO TABS: 650.0000 mg | ORAL_TABLET | ORAL | Status: DC | PRN

## 2022-08-17 MED ORDER — DOCUSATE SODIUM 100 MG PO CAPS
100.0000 mg | ORAL_CAPSULE | Freq: Every day | ORAL | Status: DC
  Administered 2022-08-17 – 2022-08-23 (×7): 100 mg via ORAL
  Filled 2022-08-17 (×7): qty 1

## 2022-08-17 MED ORDER — ASPIRIN 81 MG PO TBEC
81.0000 mg | DELAYED_RELEASE_TABLET | Freq: Every day | ORAL | Status: DC
  Administered 2022-08-18: 81 mg via ORAL
  Filled 2022-08-17: qty 1

## 2022-08-17 MED ORDER — BENZONATATE 100 MG PO CAPS
100.0000 mg | ORAL_CAPSULE | Freq: Two times a day (BID) | ORAL | Status: DC
  Administered 2022-08-17 – 2022-08-24 (×14): 100 mg via ORAL
  Filled 2022-08-17 (×14): qty 1

## 2022-08-17 MED ORDER — SODIUM CHLORIDE 0.9 % IV SOLN
2.0000 g | INTRAVENOUS | Status: DC
  Administered 2022-08-18 – 2022-08-22 (×5): 2 g via INTRAVENOUS
  Filled 2022-08-17 (×6): qty 20

## 2022-08-17 MED ORDER — CYCLOBENZAPRINE HCL 5 MG PO TABS: 5.0000 mg | ORAL_TABLET | Freq: Two times a day (BID) | ORAL | Status: DC | PRN

## 2022-08-17 MED ORDER — ASPIRIN 300 MG RE SUPP: 300.0000 mg | RECTAL | Status: AC

## 2022-08-17 MED ORDER — IOHEXOL 350 MG/ML SOLN
100.0000 mL | Freq: Once | INTRAVENOUS | Status: AC | PRN
  Administered 2022-08-17: 100 mL via INTRAVENOUS

## 2022-08-17 MED ORDER — INSULIN ASPART 100 UNIT/ML IJ SOLN
0.0000 [IU] | Freq: Three times a day (TID) | INTRAMUSCULAR | Status: DC
  Administered 2022-08-18: 3 [IU] via SUBCUTANEOUS
  Administered 2022-08-19 – 2022-08-20 (×4): 2 [IU] via SUBCUTANEOUS
  Administered 2022-08-20: 5 [IU] via SUBCUTANEOUS
  Administered 2022-08-21 – 2022-08-22 (×4): 3 [IU] via SUBCUTANEOUS
  Administered 2022-08-22: 5 [IU] via SUBCUTANEOUS

## 2022-08-17 MED ORDER — GABAPENTIN 100 MG PO CAPS
100.0000 mg | ORAL_CAPSULE | Freq: Three times a day (TID) | ORAL | Status: DC
  Administered 2022-08-17 – 2022-08-24 (×19): 100 mg via ORAL
  Filled 2022-08-17 (×21): qty 1

## 2022-08-17 MED ORDER — SODIUM CHLORIDE 0.9 % IV SOLN: 1.0000 g | INTRAVENOUS | Status: DC

## 2022-08-17 MED ORDER — METOPROLOL TARTRATE 12.5 MG HALF TABLET
12.5000 mg | ORAL_TABLET | Freq: Two times a day (BID) | ORAL | Status: DC
  Administered 2022-08-17: 12.5 mg via ORAL
  Filled 2022-08-17: qty 1

## 2022-08-17 MED ORDER — GUAIFENESIN ER 600 MG PO TB12
1200.0000 mg | ORAL_TABLET | Freq: Two times a day (BID) | ORAL | Status: DC
  Administered 2022-08-17 – 2022-08-24 (×14): 1200 mg via ORAL
  Filled 2022-08-17 (×14): qty 2

## 2022-08-17 MED ORDER — ONDANSETRON HCL 4 MG/2ML IJ SOLN
4.0000 mg | Freq: Once | INTRAMUSCULAR | Status: AC
  Administered 2022-08-17: 4 mg via INTRAVENOUS
  Filled 2022-08-17: qty 2

## 2022-08-17 MED ORDER — AZITHROMYCIN 250 MG PO TABS: 500.0000 mg | ORAL_TABLET | Freq: Every day | ORAL | Status: DC

## 2022-08-17 MED ORDER — HYDROCODONE-ACETAMINOPHEN 5-325 MG PO TABS
1.0000 | ORAL_TABLET | Freq: Four times a day (QID) | ORAL | Status: DC | PRN
  Administered 2022-08-17: 1 via ORAL
  Filled 2022-08-17: qty 1

## 2022-08-17 MED ORDER — SODIUM CHLORIDE 0.9 % IV SOLN
2.0000 g | Freq: Once | INTRAVENOUS | Status: AC
  Administered 2022-08-17: 2 g via INTRAVENOUS
  Filled 2022-08-17: qty 20

## 2022-08-17 MED ORDER — HEPARIN (PORCINE) 25000 UT/250ML-% IV SOLN
900.0000 [IU]/h | INTRAVENOUS | Status: DC
  Administered 2022-08-17: 900 [IU]/h via INTRAVENOUS
  Administered 2022-08-18: 850 [IU]/h via INTRAVENOUS
  Administered 2022-08-19: 900 [IU]/h via INTRAVENOUS
  Filled 2022-08-17 (×3): qty 250

## 2022-08-17 MED ORDER — SODIUM CHLORIDE 0.9 % IV SOLN
500.0000 mg | Freq: Once | INTRAVENOUS | Status: AC
  Administered 2022-08-17: 500 mg via INTRAVENOUS
  Filled 2022-08-17: qty 5

## 2022-08-17 MED ORDER — ONDANSETRON HCL 4 MG/2ML IJ SOLN: 4.0000 mg | Freq: Four times a day (QID) | INTRAMUSCULAR | Status: DC | PRN

## 2022-08-17 MED ORDER — AMLODIPINE-OLMESARTAN 10-20 MG PO TABS: 1.0000 | ORAL_TABLET | Freq: Every day | ORAL | Status: DC

## 2022-08-17 MED ORDER — IRBESARTAN 300 MG PO TABS
150.0000 mg | ORAL_TABLET | Freq: Every day | ORAL | Status: DC
  Filled 2022-08-17: qty 1

## 2022-08-17 MED ORDER — INFLUENZA VAC A&B SA ADJ QUAD 0.5 ML IM PRSY
0.5000 mL | PREFILLED_SYRINGE | INTRAMUSCULAR | Status: DC
  Filled 2022-08-17: qty 0.5

## 2022-08-17 MED ORDER — FERROUS SULFATE 325 (65 FE) MG PO TABS
325.0000 mg | ORAL_TABLET | Freq: Every day | ORAL | Status: DC
  Administered 2022-08-18 – 2022-08-23 (×6): 325 mg via ORAL
  Filled 2022-08-17 (×6): qty 1

## 2022-08-17 MED ORDER — ROSUVASTATIN CALCIUM 20 MG PO TABS
40.0000 mg | ORAL_TABLET | Freq: Every day | ORAL | Status: DC
  Administered 2022-08-17 – 2022-08-24 (×8): 40 mg via ORAL
  Filled 2022-08-17 (×8): qty 2

## 2022-08-17 MED ORDER — AMLODIPINE BESYLATE 5 MG PO TABS
5.0000 mg | ORAL_TABLET | Freq: Every day | ORAL | Status: DC
  Administered 2022-08-18 – 2022-08-20 (×3): 5 mg via ORAL
  Filled 2022-08-17 (×3): qty 1

## 2022-08-17 MED ORDER — HYDROCODONE-ACETAMINOPHEN 5-325 MG PO TABS
1.0000 | ORAL_TABLET | Freq: Once | ORAL | Status: AC
  Administered 2022-08-17: 1 via ORAL
  Filled 2022-08-17: qty 1

## 2022-08-17 MED ORDER — IPRATROPIUM-ALBUTEROL 0.5-2.5 (3) MG/3ML IN SOLN
3.0000 mL | Freq: Four times a day (QID) | RESPIRATORY_TRACT | Status: DC
  Administered 2022-08-17 – 2022-08-18 (×4): 3 mL via RESPIRATORY_TRACT
  Filled 2022-08-17 (×4): qty 3

## 2022-08-17 MED ORDER — NITROGLYCERIN 0.4 MG SL SUBL: 0.4000 mg | SUBLINGUAL_TABLET | SUBLINGUAL | Status: DC | PRN

## 2022-08-17 MED ORDER — ROSUVASTATIN CALCIUM 5 MG PO TABS: 10.0000 mg | ORAL_TABLET | Freq: Every day | ORAL | Status: DC

## 2022-08-17 MED ORDER — DICLOFENAC SODIUM 1 % EX GEL
2.0000 g | Freq: Four times a day (QID) | CUTANEOUS | Status: DC
  Administered 2022-08-17 – 2022-08-24 (×23): 2 g via TOPICAL
  Filled 2022-08-17: qty 100

## 2022-08-17 MED ORDER — ASPIRIN 81 MG PO CHEW
324.0000 mg | CHEWABLE_TABLET | ORAL | Status: AC
  Filled 2022-08-17: qty 4

## 2022-08-17 NOTE — Plan of Care (Signed)
The patient is admitted from ED to #E 22. A & O x4. Full assessment to epic completed. Daughter at bedside voiced no complaint. Will continue to monitor.

## 2022-08-17 NOTE — ED Provider Notes (Signed)
[facility] EMERGENCY DEPARTMENT Provider Note   CSN: [id number] Arrival date & time: 08/17/22  [id number]     History  Chief Complaint  Patient presents with   Shortness of Breath    Anna [name] is a 86 y.o. female.   Shortness of Breath Patient resents with shortness of breath and cough.  Has had for over a week now.  More fatigued.  More difficulty getting up.  Is in a wheelchair at baseline but has been having difficulty with transfers.  Has a cough some sputum production.  Reportedly has been difficulty coughing up the sputum.  Patient's family member has similar symptoms.  Is on anticoagulation for A-fib. Also reportedly has been having some blood in the stool.  Reportedly red blood.  Has had that for the last day or 2.    Past Medical History:  Diagnosis Date   Hyperlipidemia    Hypertension    Mitral regurgitation    mild to moderate by echo 04/2022   Mitral stenosis    mild by echo 04/2022   Osteoarthritis    Overweight(278.02)    Permanent atrial fibrillation (HCC)     Home Medications Prior to Admission medications   Medication Sig Start Date End Date Taking? Authorizing Provider  amlodipine-olmesartan (AZOR) 10-20 MG tablet TAKE 1 TABLET BY MOUTH EVERY DAY 05/14/22   Ladell Pier, MD  apixaban (ELIQUIS) 5 MG TABS tablet Take 1 tablet (5 mg total) by mouth 2 (two) times daily. 12/04/21   Sueanne Margarita, MD  cyclobenzaprine (FLEXERIL) 10 MG tablet Take 0.5 tablets (5 mg total) by mouth 2 (two) times daily as needed for muscle spasms. 03/30/21   Ladell Pier, MD  diclofenac Sodium (VOLTAREN) 1 % GEL Apply 2 grams to knees 4 times a day as needed. 09/06/21   Ladell Pier, MD  docusate sodium (COLACE) 100 MG capsule Take 100 mg by mouth daily.     [provider]  ferrous sulfate 325 (65 FE) MG tablet TAKE 1 TABLET BY MOUTH EVERY DAY WITH BREAKFAST 05/14/22   Ladell Pier, MD  furosemide (LASIX) 20 MG tablet 1/2 TAB BY  MOUTH TWICE AS NEEDED FOR SWELLING IN THE LEGS. 04/19/22   Ladell Pier, MD  gabapentin (NEURONTIN) 100 MG capsule TAKE 1 CAPSULE BY MOUTH THREE TIMES A DAY 01/22/22   Ladell Pier, MD  HYDROcodone-acetaminophen (NORCO/VICODIN) 5-325 MG tablet Take 1 tablet by mouth every 6 (six) hours as needed for moderate pain.    [provider]  metFORMIN (GLUCOPHAGE) 500 MG tablet TAKE 1 TABLET BY MOUTH EVERY DAY WITH BREAKFAST 11/30/21   Ladell Pier, MD  Misc. Devices MISC Electric Wheelchair 03/29/21   Ladell Pier, MD  Misc. Devices MISC Rollator walker 03/29/21   Ladell Pier, MD  Misc. [facility] bed 03/29/21   Ladell Pier, MD  Omega-3 Fatty Acids (FISH OIL CONCENTRATE PO) Take 1 capsule by mouth daily.     [provider]  rosuvastatin (CRESTOR) 10 MG tablet TAKE 1 TABLET BY MOUTH EVERY DAY 08/15/22   Pemberton, Greer Ee, MD  XARELTO 20 MG TABS tablet TAKE 1 TABLET EVERY DAY WITH SUPPER..DUE FOR OFFICE VISIT 05/14/22   Sueanne Margarita, MD      Allergies    Patient has no known allergies.    Review of Systems   Review of Systems  Respiratory:  Positive for shortness of breath.  Physical Exam Updated Vital Signs BP (!) 150/62 (BP Location: Left Arm)   Pulse 83   Temp (!) 97.5 F (36.4 C) (Oral)   Resp (!) 32   SpO2 92%  Physical Exam Vitals and nursing note reviewed.  HENT:     Head: Normocephalic.  Cardiovascular:     Rate and Rhythm: Normal rate. Rhythm irregular.  Pulmonary:     Comments: Somewhat harsh breath sounds with frequent coughing.  Does have sputum production. Chest:     Chest wall: No tenderness.  Abdominal:     Tenderness: There is no abdominal tenderness.  Musculoskeletal:     Right lower leg: Edema present.     Left lower leg: Edema present.     Comments: Some pitting edema bilateral lower extremities.  Skin:    General: Skin is warm.     Capillary Refill: Capillary refill takes less than 2 seconds.   Neurological:     Mental Status: She is alert.     ED Results / Procedures / Treatments   Labs (all labs ordered are listed, but only abnormal results are displayed) Labs Reviewed  COMPREHENSIVE METABOLIC PANEL - Abnormal; Notable for the following components:      Result Value   Glucose, Bld 152 (*)    Albumin 3.4 (*)    All other components within normal limits  CBC WITH DIFFERENTIAL/PLATELET - Abnormal; Notable for the following components:   WBC 12.5 (*)    RBC 3.64 (*)    Hemoglobin 10.5 (*)    HCT 33.0 (*)    Neutro Abs 10.6 (*)    All other components within normal limits  TROPONIN I (HIGH SENSITIVITY) - Abnormal; Notable for the following components:   Troponin I (High Sensitivity) 158 (*)    All other components within normal limits  RESP PANEL BY RT-PCR (FLU A&B, COVID) ARPGX2  CULTURE, BLOOD (ROUTINE X 2)  CULTURE, BLOOD (ROUTINE X 2)  BRAIN NATRIURETIC PEPTIDE  LACTIC ACID, PLASMA  LACTIC ACID, PLASMA  POC OCCULT BLOOD, ED  TROPONIN I (HIGH SENSITIVITY)    EKG EKG Interpretation  Date/Time:  Saturday August 17 2022 09:55:20 EDT Ventricular Rate:  73 PR Interval:    QRS Duration: 126 QT Interval:  386 QTC Calculation: 425 R Axis:   -70 Text Interpretation: Atrial fibrillation Left axis deviation Non-specific intra-ventricular conduction block Minimal voltage criteria for LVH, may be normal variant ( Cornell product ) Inferior infarct , age undetermined Cannot rule out Anterior infarct , age undetermined T wave abnormality, consider lateral ischemia Abnormal ECG When compared with ECG of 19-Mar-2021 13:57, No significant change since last tracing Confirmed by Davonna Belling [phone]) on 08/17/2022 3:18:11 PM  Radiology DG Chest 2 View  Result Date: 08/17/2022 CLINICAL DATA:  Pneumonia EXAM: CHEST - 2 VIEW COMPARISON:  CXR 07/19/21 FINDINGS: No pleural effusion. No pneumothorax. Enlarged cardiac contours. Normal mediastinal contours. No displaced rib  fractures. Visualized upper abdomen is unremarkable. There is a hazy opacity at the right lung base superimposed on a background of prominent interstitial opacities. Vertebral body heights are maintained. IMPRESSION: Hazy opacity at the right lung base superimposed on a background of prominent interstitial opacities. Findings could represent pneumonia on a background of mild pulmonary edema. In the appropriate clinical setting. Electronically Signed   By: Marin Roberts M.D.   On: 08/17/2022 11:22    Procedures Procedures    Medications Ordered in ED Medications  azithromycin (ZITHROMAX) 500 mg in sodium chloride 0.9 % 250 mL  IVPB (has no administration in time range)  ondansetron (ZOFRAN) injection 4 mg (has no administration in time range)  cefTRIAXone (ROCEPHIN) 2 g in sodium chloride 0.9 % 100 mL IVPB (0 g Intravenous Stopped 08/17/22 1507)    ED Course/ Medical Decision Making/ A&P                           Medical Decision Making Amount and/or Complexity of Data Reviewed Labs: ordered. Radiology: ordered.  Risk Prescription drug management.   Patient with shortness of breath and cough.  X-ray shows potential pneumonia at the base.  Potentially also could have some edema.  Does have edema on both of her lower extremities.  Has a mild anemia.  White count mildly elevated.  Reportedly has had blood in her stool.  Will get Hemoccult testing.  However with more shortness of breath more difficulty transferring I think she would likely benefit from mission to the hospital for the pneumonia.  Antibiotics have been started. He is on anticoagulation for chronic A-fib.  Hemoccult done with brown stool.  Hemoccult pending. Initial troponin is somewhat elevated.  I think is more likely related to demand.  X-ray shows likely pneumonia.  BNP and repeat troponin pending.  Blood cultures been done.  Lactic acid also pending.  Antibiotics have been given to cover pneumonia.  Now complaining also of  more abdominal pain.  States she has been nauseous.  Will get CT scan to evaluate and with the chest pain she been having we will also add chest CT.  Will require admission to hospital.  CRITICAL CARE Performed by: Davonna Belling Total critical care time: 30 minutes Critical care time was exclusive of separately billable procedures and treating other patients. Critical care was necessary to treat or prevent imminent or life-threatening deterioration. Critical care was time spent personally by me on the following activities: development of treatment plan with patient and/or surrogate as well as nursing, discussions with consultants, evaluation of patient's response to treatment, examination of patient, obtaining history from patient or surrogate, ordering and performing treatments and interventions, ordering and review of laboratory studies, ordering and review of radiographic studies, pulse oximetry and re-evaluation of patient's condition.           Final Clinical Impression(s) / ED Diagnoses Final diagnoses:  Community acquired pneumonia, unspecified laterality  Anemia, unspecified type    Rx / DC Orders ED Discharge Orders     None         Davonna Belling, MD 08/17/22 1520

## 2022-08-17 NOTE — ED Triage Notes (Signed)
Patient arrived by [facility] with cough x 1 week. Patient received 324mg  asa prior to arrival by ems. Patient complains of chest pain with the cough and inspiration.

## 2022-08-17 NOTE — Progress Notes (Signed)
ANTICOAGULATION CONSULT NOTE - Initial Consult  Pharmacy Consult for Heparin Indication: chest pain/ACS  No Known Allergies  Patient Measurements:   Heparin Dosing Weight: 73 kg  Vital Signs: Temp: 97.5 F (36.4 C) (10/21 1453) Temp Source: Oral (10/21 1453) BP: 98/74 (10/21 1730) Pulse Rate: 86 (10/21 1730)  Labs: Recent Labs    08/17/22 1028 08/17/22 1440  HGB 10.5*  --   HCT 33.0*  --   PLT 263  --   CREATININE 0.89  --   TROPONINIHS 158* 2,067*    CrCl cannot be calculated (Unknown ideal weight.).   Medical History: Past Medical History:  Diagnosis Date   Hyperlipidemia    Hypertension    Mitral regurgitation    mild to moderate by echo 04/2022   Mitral stenosis    mild by echo 04/2022   Osteoarthritis    Overweight(278.02)    Permanent atrial fibrillation (HCC)     Medications:  (Not in a hospital admission)  Scheduled:   amlodipine-olmesartan  1 tablet Oral Daily   aspirin  324 mg Oral NOW   Or   aspirin  300 mg Rectal NOW   [START ON 08/18/2022] aspirin EC  81 mg Oral Daily   [START ON 08/18/2022] azithromycin  500 mg Oral Daily   benzonatate  100 mg Oral BID   diclofenac Sodium  2 g Topical QID   docusate sodium  100 mg Oral Daily   [START ON 08/18/2022] ferrous sulfate  325 mg Oral Q breakfast   gabapentin  100 mg Oral TID   guaiFENesin  1,200 mg Oral BID   [START ON 08/18/2022] insulin aspart  0-15 Units Subcutaneous TID WC   ipratropium-albuterol  3 mL Nebulization Q6H   metoprolol tartrate  12.5 mg Oral BID   rosuvastatin  40 mg Oral Daily   Infusions:   [START ON 08/18/2022] cefTRIAXone (ROCEPHIN)  IV     PRN: acetaminophen, cyclobenzaprine, HYDROcodone-acetaminophen, nitroGLYCERIN, ondansetron (ZOFRAN) IV  Assessment: 65 yof with a history of HLD, HTN, mitral regurg, mitral stenosis, AF on apixaban. Patient is presenting with cough and CP. Heparin per pharmacy consult placed for chest pain/ACS.  Patient is on apixaban prior to  arrival. Last dose 10/20pm. Will require aPTT monitoring due to likely falsely high anti-Xa level secondary to DOAC use.  Hgb 10.5; plt 263 hsTrop 2067  Goal of Therapy:  Heparin level 0.3-0.7 units/ml aPTT 66-102 seconds Monitor platelets by anticoagulation protocol: Yes   Plan:  No initial heparin bolus Start heparin infusion at 900 units/hr Check aPTT & anti-Xa level in 8 hours and daily while on heparin Continue to monitor via aPTT until levels are correlated Continue to monitor H&H and platelets  Lorelei Pont, PharmD, BCPS 08/17/2022 6:42 PM ED Clinical Pharmacist -  [phone]

## 2022-08-17 NOTE — ED Provider Triage Note (Signed)
Emergency Medicine Provider Triage Evaluation Note  Anna [name] , a 86 y.o. female  was evaluated in triage.  Pt complains of cough and shortness of breath. States she has felt ill for about 1 week. Describes ongoing productive cough that is worse at night. Has had low grade fever at home, nausea without vomiting, constipation and decreased appetite. States her chest hurts with coughing and deep inspiration. Denies palpitations. Denies known sick contacts.  Review of Systems  Positive:  Negative: See above  Physical Exam  BP (!) 177/78 (BP Location: Left Arm)   Pulse 61   Temp (!) 97.4 F (36.3 C) (Oral)   Resp 20   SpO2 93%  Gen:   Awake, ill appearing Resp:  Tachypneic, borderline O2 MSK:   Moves extremities without difficulty  Other:   Medical Decision Making  Medically screening exam initiated at 10:09 AM.  Appropriate orders placed.  REINA WILTON was informed that the remainder of the evaluation will be completed by another provider, this initial triage assessment does not replace that evaluation, and the importance of remaining in the ED until their evaluation is complete.  Instructed triage RN Roselyn Reef to change ESI from 3 to 2. Patient will need sooner care for likely new oxygen requirement, abx, fluids, etc.    Mickie Hillier, PA-C 08/17/22 1013

## 2022-08-17 NOTE — H&P (Signed)
History and Physical    Anna [name] [id number] DOB: 1936-09-25 DOA: 08/17/2022  PCP: Pcp, No (Confirm with patient/family/NH records and if not entered, this has to be entered at [facility] point of entry) Patient coming from: Home  I have personally briefly reviewed patient's old medical records in [place]  Chief Complaint: Cough, SOB, feeling nausea  HPI: Anna [name] is a 86 y.o. female with medical history significant of chronic a flutter on Eliquis, mitral regurgitation/stenosis, HTN, HLD, came with new onset of cough shortness of breath chest pain and nausea.  Patient developed URI-like symptoms with nasal congestion sore throat about 7 days ago, then soon she developed a productive cough with whitish phlegm.  Denies any fever or chills.  Last 3 days, she has had pressure-like chest pain on and off associated with frequent nauseous and epigastric pain.  She also reports being constipated for about 10 days.  Chest pain has been on and off, no significant exacerbation or relieving factors.  Denies any history of aspiration pneumonia, no coughing or choking after eat or drink. ED Course: Borderline hypoxic with saturation 91% on room air, tachypneic putting weight 26-30, afebrile, blood pressure elevated systolic 892.  Chest x-ray showed right lower lobe infiltrates suspicious for pneumonia.  WBC 12.5, creatinine 0.8 hemoglobin 10.5.  Troponin 158> 2000, initial EKG no significant ST changes.  Patient was started on ceftriaxone and azithromycin  Review of Systems: As per HPI otherwise 14 point review of systems negative.   Past Medical History:  Diagnosis Date   Hyperlipidemia    Hypertension    Mitral regurgitation    mild to moderate by echo 04/2022   Mitral stenosis    mild by echo 04/2022   Osteoarthritis    Overweight(278.02)    Permanent atrial fibrillation [facility])     Past Surgical History:  Procedure Laterality Date   CARDIOVERSION N/A 03/24/2014   Procedure:  CARDIOVERSION;  Surgeon: Sueanne Margarita, MD;  Location: [facility];  Service: Cardiovascular;  Laterality: N/A;     reports that she has never smoked. She has never used smokeless tobacco. She reports that she does not drink alcohol and does not use drugs.  No Known Allergies  Family History  Problem Relation Age of Onset   Alzheimer's disease Mother      Prior to Admission medications   Medication Sig Start Date End Date Taking? Authorizing Provider  amlodipine-olmesartan (AZOR) 10-20 MG tablet TAKE 1 TABLET BY MOUTH EVERY DAY 05/14/22   Ladell Pier, MD  apixaban (ELIQUIS) 5 MG TABS tablet Take 1 tablet (5 mg total) by mouth 2 (two) times daily. 12/04/21   Sueanne Margarita, MD  cyclobenzaprine (FLEXERIL) 10 MG tablet Take 0.5 tablets (5 mg total) by mouth 2 (two) times daily as needed for muscle spasms. 03/30/21   Ladell Pier, MD  diclofenac Sodium (VOLTAREN) 1 % GEL Apply 2 grams to knees 4 times a day as needed. 09/06/21   Ladell Pier, MD  docusate sodium (COLACE) 100 MG capsule Take 100 mg by mouth daily.     [provider]  ferrous sulfate 325 (65 FE) MG tablet TAKE 1 TABLET BY MOUTH EVERY DAY WITH BREAKFAST 05/14/22   Ladell Pier, MD  furosemide (LASIX) 20 MG tablet 1/2 TAB BY MOUTH TWICE AS NEEDED FOR SWELLING IN THE LEGS. 04/19/22   Ladell Pier, MD  gabapentin (NEURONTIN) 100 MG capsule TAKE 1 CAPSULE BY MOUTH THREE TIMES A DAY  01/22/22   Ladell Pier, MD  HYDROcodone-acetaminophen (NORCO/VICODIN) 5-325 MG tablet Take 1 tablet by mouth every 6 (six) hours as needed for moderate pain.    [provider]  metFORMIN (GLUCOPHAGE) 500 MG tablet TAKE 1 TABLET BY MOUTH EVERY DAY WITH BREAKFAST 11/30/21   Ladell Pier, MD  Misc. Devices MISC Electric Wheelchair 03/29/21   Ladell Pier, MD  Misc. Devices MISC Rollator walker 03/29/21   Ladell Pier, MD  Misc. [facility] bed 03/29/21   Ladell Pier, MD  Omega-3  Fatty Acids (FISH OIL CONCENTRATE PO) Take 1 capsule by mouth daily.     [provider]  rosuvastatin (CRESTOR) 10 MG tablet TAKE 1 TABLET BY MOUTH EVERY DAY 08/15/22   Pemberton, Greer Ee, MD  XARELTO 20 MG TABS tablet TAKE 1 TABLET EVERY DAY WITH SUPPER..DUE FOR OFFICE VISIT 05/14/22   Sueanne Margarita, MD    Physical Exam: Vitals:   08/17/22 1630 08/17/22 1645 08/17/22 1700 08/17/22 1730  BP: 132/73  119/72 98/74  Pulse: 83 87 (!) 104 86  Resp: (!) 27 (!) 28 (!) 32 (!) 28  Temp:      TempSrc:      SpO2: 92% 92% 91% 94%    Constitutional: NAD, calm, comfortable Vitals:   08/17/22 1630 08/17/22 1645 08/17/22 1700 08/17/22 1730  BP: 132/73  119/72 98/74  Pulse: 83 87 (!) 104 86  Resp: (!) 27 (!) 28 (!) 32 (!) 28  Temp:      TempSrc:      SpO2: 92% 92% 91% 94%   Eyes: PERRL, lids and conjunctivae normal ENMT: Mucous membranes are moist. Posterior pharynx clear of any exudate or lesions.Normal dentition.  Neck: normal, supple, no masses, no thyromegaly Respiratory: clear to auscultation bilaterally, no wheezing, coarse crackles on right lower field, no crackles on left side, increasing respiratory effort. No accessory muscle use.  Cardiovascular: Regular rate and rhythm, systolic murmur on apex. No extremity edema. 2+ pedal pulses. No carotid bruits.  Abdomen: no tenderness, no masses palpated. No hepatosplenomegaly. Bowel sounds positive.  Musculoskeletal: no clubbing / cyanosis. No joint deformity upper and lower extremities. Good ROM, no contractures. Normal muscle tone.  Skin: no rashes, lesions, ulcers. No induration Neurologic: CN 2-12 grossly intact. Sensation intact, DTR normal. Strength 5/5 in all 4.  Psychiatric: Normal judgment and insight. Alert and oriented x 3. Normal mood.     Labs on Admission: I have personally reviewed following labs and imaging studies  CBC: Recent Labs  Lab 08/17/22 1028  WBC 12.5*  NEUTROABS 10.6*  HGB 10.5*  HCT 33.0*  MCV  90.7  PLT 99991111   Basic Metabolic Panel: Recent Labs  Lab 08/17/22 1028  NA 136  K 4.1  CL 99  CO2 25  GLUCOSE 152*  BUN 13  CREATININE 0.89  CALCIUM 9.4   GFR: CrCl cannot be calculated (Unknown ideal weight.). Liver Function Tests: Recent Labs  Lab 08/17/22 1028  AST 24  ALT 13  ALKPHOS 114  BILITOT 0.9  PROT 6.9  ALBUMIN 3.4*   No results for input(s): "LIPASE", "AMYLASE" in the last 168 hours. No results for input(s): "AMMONIA" in the last 168 hours. Coagulation Profile: No results for input(s): "INR", "PROTIME" in the last 168 hours. Cardiac Enzymes: No results for input(s): "CKTOTAL", "CKMB", "CKMBINDEX", "TROPONINI" in the last 168 hours. BNP (last 3 results) No results for input(s): "PROBNP" in the last 8760 hours. HbA1C: No results for  input(s): "HGBA1C" in the last 72 hours. CBG: No results for input(s): "GLUCAP" in the last 168 hours. Lipid Profile: No results for input(s): "CHOL", "HDL", "LDLCALC", "TRIG", "CHOLHDL", "LDLDIRECT" in the last 72 hours. Thyroid Function Tests: No results for input(s): "TSH", "T4TOTAL", "FREET4", "T3FREE", "THYROIDAB" in the last 72 hours. Anemia Panel: No results for input(s): "VITAMINB12", "FOLATE", "FERRITIN", "TIBC", "IRON", "RETICCTPCT" in the last 72 hours. Urine analysis: No results found for: "COLORURINE", "APPEARANCEUR", "LABSPEC", "PHURINE", "GLUCOSEU", "HGBUR", "BILIRUBINUR", "KETONESUR", "PROTEINUR", "UROBILINOGEN", "NITRITE", "LEUKOCYTESUR"  Radiological Exams on Admission: DG Chest 2 View  Result Date: 08/17/2022 CLINICAL DATA:  Pneumonia EXAM: CHEST - 2 VIEW COMPARISON:  CXR 07/19/21 FINDINGS: No pleural effusion. No pneumothorax. Enlarged cardiac contours. Normal mediastinal contours. No displaced rib fractures. Visualized upper abdomen is unremarkable. There is a hazy opacity at the right lung base superimposed on a background of prominent interstitial opacities. Vertebral body heights are maintained.  IMPRESSION: Hazy opacity at the right lung base superimposed on a background of prominent interstitial opacities. Findings could represent pneumonia on a background of mild pulmonary edema. In the appropriate clinical setting. Electronically Signed   By: Marin Roberts M.D.   On: 08/17/2022 11:22    EKG: Independently reviewed.  A flutter with 3-1 transduction, chronic LBBB  Assessment/Plan Principal Problem:   NSTEMI (non-ST elevated myocardial infarction) ([place])  (please populate well all problems here in Problem List. (For example, if patient is on BP meds at home and you resume or decide to hold them, it is a problem that needs to be her. Same for CAD, COPD, HLD and so on)  NSTEMI -Repeat EKG, trend troponins, discussed with on-call cardiology fellow, who agreed with beta-blocker and heparin drip for now -Aspirin 81 mg daily, increased statin to 40 mg daily -N.p.o. after midnight in case cardiology offer cardiac cath  Acute hypoxic respiratory failure -Likely secondary to RLL pneumonia, CAP, might related to aspiration or postnasal drip.  Continue ceftriaxone and azithromycin -Coughing treatment, DuoNebs, guaifenesin, incentive spirometry and flutter valve -Other DDx, clinically patient looks euvolemic despite having non-STEMI, crackles dominantly on right side active against fluid overload, will hold off Lasix.  CAP, bacterial likely -As above  History of combined mitral stenosis and mitral regurgitation -Euvolemic, hold off diuresis.  Chronic a flutter with 3:1 transduction -Rate controlled, on heparin drip for NSTEMI.  HTN -Uncontrolled, continue amlodipine and olmesartan, add beta-blocker for NSTEMI  IIDM, with hyperglycemia -Sliding scale for now  DVT prophylaxis: Heparin drip Code Status: Full code Family Communication: Daughter at bedside Disposition Plan: Patient sick with NSTEMI and concurrent pneumonia, requiring inpatient antibiotic treatment and inpatient cardiology  work-up, expect more than 2 midnight hospital stay Consults called: Cardiology Admission status: PCU   Lequita Halt MD [facility] Pager [phone]  08/17/2022, 6:32 PM

## 2022-08-17 NOTE — Consult Note (Signed)
Cardiology Consultation   Patient ID: Anna [name] MRN: [id number]; DOB: [birth year]  Admit date: 08/17/2022 Date of Consult: 08/17/2022  PCP:  Merryl Hacker No   [place] [facility] Cardiologist:  Fransico Him, MD        Patient Profile:   Anna [name] is a 86 y.o. female with a hx of TN, permanent atrial fibrillation on Xarelto and mild MR and mild mitral stenosis by echo 7-23 who is being seen 08/17/2022 for the evaluation of elevated troponin at the request of Dr. Wynetta Fines.  History of Present Illness:   [name] and her family are at bedside. Patient states she has progressively worsening productive cough, subject fever and shortness of breath for approximately a week. She also reports epigastric sharp pain when she coughs and takes a deep breath which became severe this morning and she/her family called EMS. Denies chills, dizziness, syncope, heart palpitations, dysuria, diarrhea, worsening pedal edema or any bleeding events. While I was at bedside, she complained of chest pain whenever she coughed.  Patient was admitted for acute hypoxic respiratory failure likely 2/2 pneumonia. Her troponins was elevated and cardiology was consulted.  On admission Cr 0.89, K 4.1, WBC 12.5, Hgb 10.5, troponin 158->2067, BNP 697.2 ECG@21 -Oct-2023 09:55:20 Afib with moderate VR, IVCD, LVH  Past Medical History:  Diagnosis Date   Hyperlipidemia    Hypertension    Mitral regurgitation    mild to moderate by echo 04/2022   Mitral stenosis    mild by echo 04/2022   Osteoarthritis    Overweight(278.02)    Permanent atrial fibrillation (HCC)     Past Surgical History:  Procedure Laterality Date   CARDIOVERSION N/A 03/24/2014   Procedure: CARDIOVERSION;  Surgeon: Sueanne Margarita, MD;  Location: [place];  Service: Cardiovascular;  Laterality: N/A;       Inpatient Medications: Scheduled Meds:  [START ON 08/18/2022] amLODipine  5 mg Oral Daily   And   [START ON 08/18/2022]  irbesartan  150 mg Oral Daily   aspirin  324 mg Oral NOW   Or   aspirin  300 mg Rectal NOW   [START ON 08/18/2022] aspirin EC  81 mg Oral Daily   [START ON 08/18/2022] azithromycin  500 mg Oral Daily   benzonatate  100 mg Oral BID   diclofenac Sodium  2 g Topical QID   docusate sodium  100 mg Oral Daily   [START ON 08/18/2022] ferrous sulfate  325 mg Oral Q breakfast   gabapentin  100 mg Oral TID   guaiFENesin  1,200 mg Oral BID   [START ON 08/18/2022] insulin aspart  0-15 Units Subcutaneous TID WC   ipratropium-albuterol  3 mL Nebulization Q6H   metoprolol tartrate  12.5 mg Oral BID   rosuvastatin  40 mg Oral Daily   Continuous Infusions:  [START ON 08/18/2022] cefTRIAXone (ROCEPHIN)  IV     heparin     PRN Meds: acetaminophen, cyclobenzaprine, HYDROcodone-acetaminophen, nitroGLYCERIN, ondansetron (ZOFRAN) IV  Allergies:   No Known Allergies  Social History:   Social History   Socioeconomic History   Marital status: Divorced    Spouse name: Not on file   Number of children: Not on file   Years of education: Not on file   Highest education level: Not on file  Occupational History   Not on file  Tobacco Use   Smoking status: Never   Smokeless tobacco: Never  Substance and Sexual Activity   Alcohol use: No  Drug use: No   Sexual activity: Not on file  Other Topics Concern   Not on file  Social History Narrative   Not on file   Social Determinants of Health   Financial Resource Strain: Low Risk  (07/15/2021)   Overall Financial Resource Strain (CARDIA)    Difficulty of Paying Living Expenses: Not hard at all  Food Insecurity: Not on file  Transportation Needs: No Transportation Needs (07/15/2021)   PRAPARE - Administrator, Civil Service (Medical): No    Lack of Transportation (Non-Medical): No  Physical Activity: Insufficiently Active (07/15/2021)   Exercise Vital Sign    Days of Exercise per Week: 5 days    Minutes of Exercise per Session: 10 min   Stress: Not on file  Social Connections: Not on file  Intimate Partner Violence: Not on file    Family History:    Family History  Problem Relation Age of Onset   Alzheimer's disease Mother      ROS:  Please see the history of present illness.   All other ROS reviewed and negative.     Physical Exam/Data:   Vitals:   08/17/22 1700 08/17/22 1730 08/17/22 1856 08/17/22 1900  BP: 119/72 98/74  125/84  Pulse: (!) 104 86  84  Resp: (!) 32 (!) 28  (!) 23  Temp:      TempSrc:      SpO2: 91% 94%  91%  Weight:   77.6 kg   Height:   5\' 5"  (1.651 m)    No intake or output data in the 24 hours ending 08/17/22 1941    08/17/2022    6:56 PM 03/28/2022    2:24 PM 12/04/2021    3:17 PM  Last 3 Weights  Weight (lbs) 171 lb 182 lb 176 lb  Weight (kg) 77.565 kg 82.555 kg 79.833 kg     Body mass index is 28.46 kg/m.  General:  Well nourished, well developed, in no acute distress HEENT: normal Neck: no JVD Vascular: No carotid bruits; Distal pulses 2+ bilaterally Cardiac: tenderness on palpation epigastric area, irregularly irregular rhythm; no murmur  Lungs:  decreased breath sounds, rhonchi  Abd: soft, nontender, no hepatomegaly  Ext: no edema Musculoskeletal:  No deformities, BUE and BLE strength normal and equal Skin: warm and dry  Neuro:  CNs 2-12 intact, no focal abnormalities noted Psych:  Normal affect   EKG:  The EKG was personally reviewed and demonstrates:    Relevant CV Studies:   Laboratory Data:  High Sensitivity Troponin:   Recent Labs  Lab 08/17/22 1028 08/17/22 1440  TROPONINIHS 158* 2,067*     Chemistry Recent Labs  Lab 08/17/22 1028  NA 136  K 4.1  CL 99  CO2 25  GLUCOSE 152*  BUN 13  CREATININE 0.89  CALCIUM 9.4  GFRNONAA >60  ANIONGAP 12    Recent Labs  Lab 08/17/22 1028  PROT 6.9  ALBUMIN 3.4*  AST 24  ALT 13  ALKPHOS 114  BILITOT 0.9   Lipids No results for input(s): "CHOL", "TRIG", "HDL", "LABVLDL", "LDLCALC", "CHOLHDL" in  the last 168 hours.  Hematology Recent Labs  Lab 08/17/22 1028  WBC 12.5*  RBC 3.64*  HGB 10.5*  HCT 33.0*  MCV 90.7  MCH 28.8  MCHC 31.8  RDW 13.2  PLT 263   Thyroid No results for input(s): "TSH", "FREET4" in the last 168 hours.  BNP Recent Labs  Lab 08/17/22 1440  BNP 697.2*  DDimer No results for input(s): "DDIMER" in the last 168 hours.   Radiology/Studies:  CT CHEST ABDOMEN PELVIS W CONTRAST  Result Date: 08/17/2022 CLINICAL DATA:  abdominal pain, pneumonia EXAM: CT CHEST, ABDOMEN, AND PELVIS WITH CONTRAST TECHNIQUE: Multidetector CT imaging of the chest, abdomen and pelvis was performed following the standard protocol during bolus administration of intravenous contrast. RADIATION DOSE REDUCTION: This exam was performed according to the departmental dose-optimization program which includes automated exposure control, adjustment of the mA and/or kV according to patient size and/or use of iterative reconstruction technique. CONTRAST:  140mL OMNIPAQUE IOHEXOL 350 MG/ML SOLN COMPARISON:  Chest x-ray 03/19/2021 FINDINGS: CT CHEST FINDINGS Cardiovascular: Enlarged heart size. No significant pericardial effusion. The thoracic aorta is normal in caliber. Mild-to-moderate atherosclerotic plaque of the thoracic aorta. Four-vessel coronary artery calcifications. Mitral annular calcification. The main pulmonary artery measures at the upper limits of normal. No central or segmental pulmonary embolus. Mediastinum/Nodes: Right hilar lymphadenopathy measuring up to 1.3 cm (3:27). Mediastinal lymphadenopathy with as an example a precarinal 1.4 cm lymph node (3:22). No enlarged mediastinal, hilar, or axillary lymph nodes. Thyroid gland, trachea, and esophagus demonstrate no significant findings. Lungs/Pleura: Interlobular septal wall thickening. Bilateral lower lobe peribronchovascular ground-glass airspace opacity and consolidation, right greater than left. Right upper lobe linear atelectasis  versus scarring for. Right upper lobe 6 mm pulmonary nodule. Right upper lobe 1.2 x 0.7 cm pulmonary nodule. Scattered right upper lobe peribronchovascular ground-glass airspace opacities. No pulmonary mass. Trace bilateral pleural effusions. No pneumothorax. Musculoskeletal: No chest wall abnormality. No suspicious lytic or blastic osseous lesions. Chronic T12 anterior wedge compression fracture with at least 20% vertebral body height loss. Multilevel degenerative changes of the spine. CT ABDOMEN PELVIS FINDINGS Hepatobiliary: No focal liver abnormality. Layering hyperdensity within the gallbladder lumen likely calcified gallstones noted within the gallbladder lumen. No gallbladder wall thickening or pericholecystic fluid. No biliary dilatation. Pancreas: No focal lesion. Normal pancreatic contour. No surrounding inflammatory changes. No main pancreatic ductal dilatation. Spleen: Normal in size without focal abnormality. Adrenals/Urinary Tract: No adrenal nodule bilaterally. Bilateral kidneys enhance symmetrically. Right renal cortical scarring. Couple of fluid density lesions within the right kidney likely represent simple renal cysts. Simple renal cysts, in the absence of clinically indicated signs/symptoms, require no independent follow-up. Subcentimeter hypodensities are too small to characterize. No hydronephrosis. No hydroureter. The urinary bladder is unremarkable. Stomach/Bowel: Stomach is within normal limits. No evidence of bowel wall thickening or dilatation. Colonic diverticulosis with no acute diverticulitis. Appendix appears normal. Vascular/Lymphatic: No abdominal aorta or iliac aneurysm. At least moderate atherosclerotic plaque of the aorta and its branches. No abdominal, pelvic, or inguinal lymphadenopathy. Reproductive: Slightly heterogeneous, enhancing, and enlarged left ovary measuring 3.8 x 2.5 cm of unclear etiology. Uterus and bilateral adnexa are unremarkable. Other: No intraperitoneal free  fluid. No intraperitoneal free gas. No organized fluid collection. Vague slight heterogeneity and fat stranding of the right pericolic gutter peritoneum of unclear etiology (3:70 4-78). Musculoskeletal: No abdominal wall hernia or abnormality. No suspicious lytic or blastic osseous lesions. No acute displaced fracture. Multilevel degenerative changes of the spine most prominent at the L3-L4 level. Mild retrolisthesis of L1 on L2 and L2 on L3. IMPRESSION: 1. Cardiomegaly. 2. Multifocal pneumonia most prominent within the right lower lobe. 3. Trace bilateral pleural effusions. 4. Mediastinal and right hilar lymphadenopathy likely reactive in etiology. Recommend attention on follow-up. 5. A 1.2 cm x 0.7 cm and a 0.6 cm right upper lobe pulmonary nodules. Non-contrast chest CT at 3-6 months is recommended.  If the nodules are stable at time of repeat CT, then future CT at 18-24 months (from today's scan) is considered optional for low-risk patients, but is recommended for high-risk patients. This recommendation follows the consensus statement: Guidelines for Management of Incidental Pulmonary Nodules Detected on CT Images: From the Fleischner Society 2017; Radiology 2017; 284:228-243. 6. Slightly heterogeneous, hyperdense, and enlarged left ovary measuring 3.8 x 2.5 cm of unclear etiology. Recommend pelvic ultrasound for further evaluation. 7. Vague slight heterogeneity and fat stranding of the right pericolic gutter peritoneum of unclear etiology. Recommend correlation with prior cross-sectional imaging. Attention on follow-up. 8. Cholelithiasis with no CT evidence of acute cholecystitis. 9. Aortic Atherosclerosis (ICD10-I70.0) including four-vessel coronary calcification and mitral annular calcification. Electronically Signed   By: Iven Finn M.D.   On: 08/17/2022 18:52   DG Chest 2 View  Result Date: 08/17/2022 CLINICAL DATA:  Pneumonia EXAM: CHEST - 2 VIEW COMPARISON:  CXR 07/19/21 FINDINGS: No pleural  effusion. No pneumothorax. Enlarged cardiac contours. Normal mediastinal contours. No displaced rib fractures. Visualized upper abdomen is unremarkable. There is a hazy opacity at the right lung base superimposed on a background of prominent interstitial opacities. Vertebral body heights are maintained. IMPRESSION: Hazy opacity at the right lung base superimposed on a background of prominent interstitial opacities. Findings could represent pneumonia on a background of mild pulmonary edema. In the appropriate clinical setting. Electronically Signed   By: Marin Roberts M.D.   On: 08/17/2022 11:22     Assessment and Plan:   #Elevated troponins  #Chest pain -atypical chest pain/tenderness on palpation/worse with cough or deep inspiration, probably non-cardiac pain -ECG is not concerning for ACS, elevated troponins may 2/2 demand ischemia -continue to trend troponin and ECG prn -risk stratification with A1C, TSH and lipid panel -acquire a TTE am -agree with ASA and heparin gtt -continue home crestor -Given her CAD risk factors, patient will benefit an ischemic work-up prior to discharge or as outpatient if clinically stable  #Permanent atrial fibrillation -continue Telemetry and optimize electrolytes as needed -VR is currently well controlled without rate control medication at home -continue heparin gtt, last dose eliquis was 10/20 pm  Risk Assessment/Risk Scores:     CHA2DS2-VASc Score = 3     For questions or updates, please contact [place] Please consult [email or web address] for contact info under    Signed, Laurice Record, MD  08/17/2022 7:41 PM

## 2022-08-18 ENCOUNTER — Inpatient Hospital Stay (HOSPITAL_COMMUNITY): Payer: Medicare Other

## 2022-08-18 DIAGNOSIS — I214 Non-ST elevation (NSTEMI) myocardial infarction: Secondary | ICD-10-CM | POA: Diagnosis not present

## 2022-08-18 DIAGNOSIS — I4892 Unspecified atrial flutter: Secondary | ICD-10-CM | POA: Diagnosis not present

## 2022-08-18 DIAGNOSIS — I509 Heart failure, unspecified: Secondary | ICD-10-CM

## 2022-08-18 LAB — BASIC METABOLIC PANEL
Anion gap: 14 (ref 5–15)
BUN: 19 mg/dL (ref 8–23)
CO2: 22 mmol/L (ref 22–32)
Calcium: 9 mg/dL (ref 8.9–10.3)
Chloride: 97 mmol/L — ABNORMAL LOW (ref 98–111)
Creatinine, Ser: 1.1 mg/dL — ABNORMAL HIGH (ref 0.44–1.00)
GFR, Estimated: 50 mL/min — ABNORMAL LOW (ref >60)
Glucose, Bld: 204 mg/dL — ABNORMAL HIGH (ref 70–99)
Potassium: 4.7 mmol/L (ref 3.5–5.1)
Sodium: 133 mmol/L — ABNORMAL LOW (ref 135–145)

## 2022-08-18 LAB — ECHOCARDIOGRAM COMPLETE
AR max vel: 2.11 cm2
AV Area VTI: 2.44 cm2
AV Area mean vel: 2.26 cm2
AV Mean grad: 3.1 mmHg
AV Peak grad: 6.2 mmHg
Ao pk vel: 1.24 m/s
Area-P 1/2: 1.47 cm2
Calc EF: 40.8 %
Height: 65 in
MV VTI: 1.53 cm2
S' Lateral: 2.6 cm
Single Plane A2C EF: 40.5 %
Single Plane A4C EF: 41.8 %
Weight: 2836 oz

## 2022-08-18 LAB — GLUCOSE, CAPILLARY
Glucose-Capillary: 166 mg/dL — ABNORMAL HIGH (ref 70–99)
Glucose-Capillary: 86 mg/dL (ref 70–99)
Glucose-Capillary: 94 mg/dL (ref 70–99)
Glucose-Capillary: 171 mg/dL — ABNORMAL HIGH (ref 70–99)

## 2022-08-18 LAB — HEPARIN LEVEL (UNFRACTIONATED)
Heparin Unfractionated: >1.1 IU/mL — ABNORMAL HIGH (ref 0.30–0.70)
Heparin Unfractionated: >1.1 IU/mL — ABNORMAL HIGH (ref 0.30–0.70)

## 2022-08-18 LAB — APTT
aPTT: 54 seconds — ABNORMAL HIGH (ref 24–36)
aPTT: 100 seconds — ABNORMAL HIGH (ref 24–36)
aPTT: 88 seconds — ABNORMAL HIGH (ref 24–36)

## 2022-08-18 LAB — LIPID PANEL
Cholesterol: 69 mg/dL (ref 0–200)
HDL: 33 mg/dL — ABNORMAL LOW (ref >40)
LDL Cholesterol: 24 mg/dL (ref 0–99)
Total CHOL/HDL Ratio: 2.1 RATIO
Triglycerides: 62 mg/dL (ref <150)
VLDL: 12 mg/dL (ref 0–40)

## 2022-08-18 LAB — TROPONIN I (HIGH SENSITIVITY)
Troponin I (High Sensitivity): 5091 ng/L (ref <18)
Troponin I (High Sensitivity): 4331 ng/L (ref <18)

## 2022-08-18 LAB — PROCALCITONIN: Procalcitonin: 0.85 ng/mL

## 2022-08-18 LAB — STREP PNEUMONIAE URINARY ANTIGEN: Strep Pneumo Urinary Antigen: NEGATIVE

## 2022-08-18 LAB — MAGNESIUM: Magnesium: 1.6 mg/dL — ABNORMAL LOW (ref 1.7–2.4)

## 2022-08-18 LAB — HEMOGLOBIN A1C
Hgb A1c MFr Bld: 5.8 % — ABNORMAL HIGH (ref 4.8–5.6)
Mean Plasma Glucose: 119.76 mg/dL

## 2022-08-18 LAB — LACTIC ACID, PLASMA: Lactic Acid, Venous: 1.6 mmol/L (ref 0.5–1.9)

## 2022-08-18 MED ORDER — SODIUM CHLORIDE 0.9 % IV SOLN: INTRAVENOUS | Status: DC

## 2022-08-18 MED ORDER — SODIUM CHLORIDE 0.9% FLUSH
3.0000 mL | Freq: Two times a day (BID) | INTRAVENOUS | Status: DC
  Administered 2022-08-18 – 2022-08-22 (×7): 3 mL via INTRAVENOUS

## 2022-08-18 MED ORDER — SODIUM CHLORIDE 0.9 % IV SOLN: 250.0000 mL | INTRAVENOUS | Status: DC | PRN

## 2022-08-18 MED ORDER — FUROSEMIDE 10 MG/ML IJ SOLN
40.0000 mg | Freq: Two times a day (BID) | INTRAMUSCULAR | Status: DC
  Administered 2022-08-18 (×2): 40 mg via INTRAVENOUS
  Filled 2022-08-18 (×2): qty 4

## 2022-08-18 MED ORDER — MAGNESIUM SULFATE 2 GM/50ML IV SOLN
2.0000 g | Freq: Once | INTRAVENOUS | Status: AC
  Administered 2022-08-18: 2 g via INTRAVENOUS
  Filled 2022-08-18: qty 50

## 2022-08-18 MED ORDER — LORAZEPAM 2 MG/ML IJ SOLN
0.2500 mg | Freq: Four times a day (QID) | INTRAMUSCULAR | Status: DC | PRN
  Administered 2022-08-18: 0.25 mg via INTRAVENOUS
  Filled 2022-08-18: qty 1

## 2022-08-18 MED ORDER — SODIUM CHLORIDE 0.9% FLUSH: 3.0000 mL | INTRAVENOUS | Status: DC | PRN

## 2022-08-18 MED ORDER — ASPIRIN 81 MG PO CHEW
81.0000 mg | CHEWABLE_TABLET | ORAL | Status: AC
  Administered 2022-08-19: 81 mg via ORAL
  Filled 2022-08-18: qty 1

## 2022-08-18 MED ORDER — IPRATROPIUM-ALBUTEROL 0.5-2.5 (3) MG/3ML IN SOLN: 3.0000 mL | Freq: Three times a day (TID) | RESPIRATORY_TRACT | Status: DC

## 2022-08-18 MED ORDER — SODIUM CHLORIDE 0.9 % IV SOLN
100.0000 mg | Freq: Two times a day (BID) | INTRAVENOUS | Status: DC
  Administered 2022-08-18 – 2022-08-22 (×10): 100 mg via INTRAVENOUS
  Filled 2022-08-18 (×12): qty 100

## 2022-08-18 MED ORDER — ASPIRIN 81 MG PO TBEC
81.0000 mg | DELAYED_RELEASE_TABLET | Freq: Every day | ORAL | Status: DC
  Administered 2022-08-20 – 2022-08-21 (×2): 81 mg via ORAL
  Filled 2022-08-18 (×2): qty 1

## 2022-08-18 MED ORDER — PERFLUTREN LIPID MICROSPHERE
1.0000 mL | INTRAVENOUS | Status: AC | PRN
  Administered 2022-08-18: 3 mL via INTRAVENOUS

## 2022-08-18 NOTE — Progress Notes (Signed)
Rounding Note    Patient Name: Anna [name] Date of Encounter: 08/18/2022  [place] Cardiologist: Fransico Him, MD   Subjective   BP 112/60, creatinine 1.1.  Troponin up to 5091.  Currently denies any chest pain  Inpatient Medications    Scheduled Meds:  amLODipine  5 mg Oral Daily   And   irbesartan  150 mg Oral Daily   aspirin  324 mg Oral NOW   Or   aspirin  300 mg Rectal NOW   aspirin EC  81 mg Oral Daily   benzonatate  100 mg Oral BID   diclofenac Sodium  2 g Topical QID   docusate sodium  100 mg Oral Daily   ferrous sulfate  325 mg Oral Q breakfast   gabapentin  100 mg Oral TID   guaiFENesin  1,200 mg Oral BID   influenza vaccine adjuvanted  0.5 mL Intramuscular Tomorrow-1000   insulin aspart  0-15 Units Subcutaneous TID WC   ipratropium-albuterol  3 mL Nebulization Q6H   rosuvastatin  40 mg Oral Daily   Continuous Infusions:  cefTRIAXone (ROCEPHIN)  IV     doxycycline (VIBRAMYCIN) IV 100 mg (08/18/22 0833)   heparin 850 Units/hr (08/18/22 0833)   magnesium sulfate bolus IVPB     PRN Meds: acetaminophen, cyclobenzaprine, HYDROcodone-acetaminophen, LORazepam, nitroGLYCERIN   Vital Signs    Vitals:   08/18/22 0820 08/18/22 0825 08/18/22 0830 08/18/22 0835  BP:      Pulse: 62 (!) 39 (!) 49 (!) 41  Resp: (!) 22 18 14 15   Temp:      TempSrc:      SpO2: 98% 99% 98% 99%  Weight:      Height:        Intake/Output Summary (Last 24 hours) at 08/18/2022 1009 Last data filed at 08/18/2022 0700 Gross per 24 hour  Intake 81.31 ml  Output --  Net 81.31 ml      08/18/2022    3:41 AM 08/17/2022    6:56 PM 03/28/2022    2:24 PM  Last 3 Weights  Weight (lbs) 177 lb 4 oz 171 lb 182 lb  Weight (kg) 80.4 kg 77.565 kg 82.555 kg      Telemetry    Afib with rates down to 30-40s overnight, currently in 11s - Personally Reviewed  ECG    Afib, rate 49, subtle ST elev V2-5, with TWI in I,II, aVL, V2-6 - Personally Reviewed  Physical Exam    GEN: No acute distress.   Neck: + JVD Cardiac: RRR, no murmurs Respiratory: Expiratory wheezing GI: Soft, nontender MS: No edema; No deformity. Neuro:  Nonfocal  Psych: Normal affect   Labs    High Sensitivity Troponin:   Recent Labs  Lab 08/17/22 1028 08/17/22 1440 08/18/22 0038 08/18/22 0620  TROPONINIHS 158* 2,067* 5,091* 4,331*     Chemistry Recent Labs  Lab 08/17/22 1028 08/18/22 0038 08/18/22 0620  NA 136 133*  --   K 4.1 4.7  --   CL 99 97*  --   CO2 25 22  --   GLUCOSE 152* 204*  --   BUN 13 19  --   CREATININE 0.89 1.10*  --   CALCIUM 9.4 9.0  --   MG  --   --  1.6*  PROT 6.9  --   --   ALBUMIN 3.4*  --   --   AST 24  --   --   ALT 13  --   --  ALKPHOS 114  --   --   BILITOT 0.9  --   --   GFRNONAA >60 50*  --   ANIONGAP 12 14  --     Lipids  Recent Labs  Lab 08/18/22 0620  CHOL 69  TRIG 62  HDL 33*  LDLCALC 24  CHOLHDL 2.1    Hematology Recent Labs  Lab 08/17/22 1028  WBC 12.5*  RBC 3.64*  HGB 10.5*  HCT 33.0*  MCV 90.7  MCH 28.8  MCHC 31.8  RDW 13.2  PLT 263   Thyroid No results for input(s): "TSH", "FREET4" in the last 168 hours.  BNP Recent Labs  Lab 08/17/22 1440  BNP 697.2*    DDimer No results for input(s): "DDIMER" in the last 168 hours.   Radiology    CT CHEST ABDOMEN PELVIS W CONTRAST  Result Date: 08/17/2022 CLINICAL DATA:  abdominal pain, pneumonia EXAM: CT CHEST, ABDOMEN, AND PELVIS WITH CONTRAST TECHNIQUE: Multidetector CT imaging of the chest, abdomen and pelvis was performed following the standard protocol during bolus administration of intravenous contrast. RADIATION DOSE REDUCTION: This exam was performed according to the departmental dose-optimization program which includes automated exposure control, adjustment of the mA and/or kV according to patient size and/or use of iterative reconstruction technique. CONTRAST:  136mL OMNIPAQUE IOHEXOL 350 MG/ML SOLN COMPARISON:  Chest x-ray 03/19/2021 FINDINGS: CT  CHEST FINDINGS Cardiovascular: Enlarged heart size. No significant pericardial effusion. The thoracic aorta is normal in caliber. Mild-to-moderate atherosclerotic plaque of the thoracic aorta. Four-vessel coronary artery calcifications. Mitral annular calcification. The main pulmonary artery measures at the upper limits of normal. No central or segmental pulmonary embolus. Mediastinum/Nodes: Right hilar lymphadenopathy measuring up to 1.3 cm (3:27). Mediastinal lymphadenopathy with as an example a precarinal 1.4 cm lymph node (3:22). No enlarged mediastinal, hilar, or axillary lymph nodes. Thyroid gland, trachea, and esophagus demonstrate no significant findings. Lungs/Pleura: Interlobular septal wall thickening. Bilateral lower lobe peribronchovascular ground-glass airspace opacity and consolidation, right greater than left. Right upper lobe linear atelectasis versus scarring for. Right upper lobe 6 mm pulmonary nodule. Right upper lobe 1.2 x 0.7 cm pulmonary nodule. Scattered right upper lobe peribronchovascular ground-glass airspace opacities. No pulmonary mass. Trace bilateral pleural effusions. No pneumothorax. Musculoskeletal: No chest wall abnormality. No suspicious lytic or blastic osseous lesions. Chronic T12 anterior wedge compression fracture with at least 20% vertebral body height loss. Multilevel degenerative changes of the spine. CT ABDOMEN PELVIS FINDINGS Hepatobiliary: No focal liver abnormality. Layering hyperdensity within the gallbladder lumen likely calcified gallstones noted within the gallbladder lumen. No gallbladder wall thickening or pericholecystic fluid. No biliary dilatation. Pancreas: No focal lesion. Normal pancreatic contour. No surrounding inflammatory changes. No main pancreatic ductal dilatation. Spleen: Normal in size without focal abnormality. Adrenals/Urinary Tract: No adrenal nodule bilaterally. Bilateral kidneys enhance symmetrically. Right renal cortical scarring. Couple of  fluid density lesions within the right kidney likely represent simple renal cysts. Simple renal cysts, in the absence of clinically indicated signs/symptoms, require no independent follow-up. Subcentimeter hypodensities are too small to characterize. No hydronephrosis. No hydroureter. The urinary bladder is unremarkable. Stomach/Bowel: Stomach is within normal limits. No evidence of bowel wall thickening or dilatation. Colonic diverticulosis with no acute diverticulitis. Appendix appears normal. Vascular/Lymphatic: No abdominal aorta or iliac aneurysm. At least moderate atherosclerotic plaque of the aorta and its branches. No abdominal, pelvic, or inguinal lymphadenopathy. Reproductive: Slightly heterogeneous, enhancing, and enlarged left ovary measuring 3.8 x 2.5 cm of unclear etiology. Uterus and bilateral adnexa are unremarkable. Other: No  intraperitoneal free fluid. No intraperitoneal free gas. No organized fluid collection. Vague slight heterogeneity and fat stranding of the right pericolic gutter peritoneum of unclear etiology (3:70 4-78). Musculoskeletal: No abdominal wall hernia or abnormality. No suspicious lytic or blastic osseous lesions. No acute displaced fracture. Multilevel degenerative changes of the spine most prominent at the L3-L4 level. Mild retrolisthesis of L1 on L2 and L2 on L3. IMPRESSION: 1. Cardiomegaly. 2. Multifocal pneumonia most prominent within the right lower lobe. 3. Trace bilateral pleural effusions. 4. Mediastinal and right hilar lymphadenopathy likely reactive in etiology. Recommend attention on follow-up. 5. A 1.2 cm x 0.7 cm and a 0.6 cm right upper lobe pulmonary nodules. Non-contrast chest CT at 3-6 months is recommended. If the nodules are stable at time of repeat CT, then future CT at 18-24 months (from today's scan) is considered optional for low-risk patients, but is recommended for high-risk patients. This recommendation follows the consensus statement: Guidelines for  Management of Incidental Pulmonary Nodules Detected on CT Images: From the Fleischner Society 2017; Radiology 2017; 284:228-243. 6. Slightly heterogeneous, hyperdense, and enlarged left ovary measuring 3.8 x 2.5 cm of unclear etiology. Recommend pelvic ultrasound for further evaluation. 7. Vague slight heterogeneity and fat stranding of the right pericolic gutter peritoneum of unclear etiology. Recommend correlation with prior cross-sectional imaging. Attention on follow-up. 8. Cholelithiasis with no CT evidence of acute cholecystitis. 9. Aortic Atherosclerosis (ICD10-I70.0) including four-vessel coronary calcification and mitral annular calcification. Electronically Signed   By: Iven Finn M.D.   On: 08/17/2022 18:52   DG Chest 2 View  Result Date: 08/17/2022 CLINICAL DATA:  Pneumonia EXAM: CHEST - 2 VIEW COMPARISON:  CXR 07/19/21 FINDINGS: No pleural effusion. No pneumothorax. Enlarged cardiac contours. Normal mediastinal contours. No displaced rib fractures. Visualized upper abdomen is unremarkable. There is a hazy opacity at the right lung base superimposed on a background of prominent interstitial opacities. Vertebral body heights are maintained. IMPRESSION: Hazy opacity at the right lung base superimposed on a background of prominent interstitial opacities. Findings could represent pneumonia on a background of mild pulmonary edema. In the appropriate clinical setting. Electronically Signed   By: Marin Roberts M.D.   On: 08/17/2022 11:22    Cardiac Studies     Patient Profile     86 y.o. female with history of atrial flutter on Eliquis, hypertension, hyperlipidemia presented with chest pain, shortness of breath, nausea.  Assessment & Plan    NSTEMI: Presented with chest pain.  Troponin peak 5091.  EKG overnight shows subtle ST elevations in V2-5 with new TWI from prior EKG.  Currently chest pain free -Continue heparin drip.  Last dose of Eliquis was 10/21 AM -Continue aspirin 81 mg  daily -Continue rosuvastatin 40 mg daily -No beta-blocker given bradycardia -Echo -D/w interventional cardiology given ST changes on EKG overnight.  She currently is chest pain free, would plan for cath tomorrow, but if having recurrent chest pain will need to cath today.  Risks and benefits of cardiac catheterization have been discussed with the patient.  These include bleeding, infection, kidney damage, stroke, heart attack, death.  The patient understands these risks and is willing to proceed.  Acute heart failure: Chest x-ray with possible pneumonia versus edema.  BNP 677.  JVD elevated on exam -Start IV lasix 40 mg BID -F/u echo as above  Chronic atrial flutter: Rate controlled despite no AV nodal blockers.  On heparin drip as above.    Acute hypoxic respiratory failure: On antibiotics for possible pneumonia.  Suspect more likely pulmonary edema from heart failure as above.  Check procalcitonin  Hypertension: On amlodipine 5 mg daily and irbesartan 150 mg daily.  Will hold irbesartan for now, can resume post cath if renal function and BP stable  For questions or updates, please contact [place] Please consult [email or web address] for contact info under        Signed, Donato Heinz, MD  08/18/2022, 10:09 AM

## 2022-08-18 NOTE — Progress Notes (Signed)
Troponin jumped from 2067 to 5091. She's not showing s/s of it. Heparin drip in process. Notified Dr. Marlowe Sax and she indicated to notify the Cardiologist and also do EKG.Will continue to monitor.

## 2022-08-18 NOTE — Progress Notes (Signed)
Dr. Marlowe Sax just responded , stated to notify the cardiologist if she goes to abnormal bradycardia.

## 2022-08-18 NOTE — Progress Notes (Signed)
PT Cancellation Note  Patient Details Name: Anna [name] MRN: [id number] DOB: Sep 23, 1936   Cancelled Treatment:    Reason Eval/Treat Not Completed: Medical issues which prohibited therapy. Pt with NSTEMI per chart, plan for cardiac cath tomorrow. PT will hold evaluation at this time until cath is performed.   Zenaida Niece 08/18/2022, 2:04 PM

## 2022-08-18 NOTE — Progress Notes (Signed)
Overnight progress note  Troponin trending up 158 > 2067 > 5091 concerning for NSTEMI.  Patient is currently chest pain-free.  Heart rate is in the 40s to 50s.  Blood pressure stable (132/60).  EKG showing A-fib with slow ventricular response and worsening T wave inversions, now present diffusely.  QTc 529.  Potassium normal on morning labs.  -Discussed with Dr. Alfred Levins with cardiology.  Recommending keeping the patient n.p.o. in case cardiology team decides to take her to the Cath Lab today.  Hold beta-blocker given bradycardia. -Continue IV heparin, aspirin, statin -Avoid QT prolonging drugs/discontinue Zofran.  She is on ceftriaxone and azithromycin for community-acquired pneumonia.  Will discontinue azithromycin and instead switch to doxycycline.  Check magnesium level.

## 2022-08-18 NOTE — Progress Notes (Signed)
PROGRESS NOTE    Anna [name]  [id number] DOB: [birth year] DOA: 08/17/2022 PCP: Pcp, No  Chief Complaint  Patient presents with   Shortness of Breath    Brief Narrative:  Anna [name] is Anna [name] 86 y.o. female with medical history significant of chronic Anna [name] flutter on Eliquis, mitral regurgitation/stenosis, HTN, HLD, came with new onset of cough shortness of breath chest pain and nausea.  She's been diagnosed with an NSTEMI and community acquired pneumonia.  Assessment & Plan:   Principal Problem:   NSTEMI (non-ST elevated myocardial infarction) [facility]) Active Problems:   Chronic atrial flutter (HCC)   Acute heart failure (HCC)   Assessment and Plan: NSTEMI - troponin peaked at 5091 - appreciate cardiology assistance - heparin gtt, apsirin, crestor, not on beta blocker in setting of bradycardia - LDL 24, A1c 5.8 - echo wth EF 25%, regional wall motion abnormalities, RVSF mildly reduced, AV with indeterminant number of cusps - plan at this point for cath in AM - if symptoms needs cath today  Acute HFrEF - EF 25% as noted above with RWMA - appreciate cards assistance again, lasix 40 mg IV BID  Acute hypoxic respiratory failure  Multifocal Pneumonia  - CT findings concerning for multifocal pneumonia - continue ceftriaxone/doxycycline - follow blood cultures, sputum culture, urine strep, urine legionella  - heart failure also contributing to SOB   Chronic Anna [name] flutter  -Rate controlled, on heparin drip for NSTEMI.  History of combined mitral stenosis and mitral regurgitation -per cardiology -echo with indeterminate number of cusp of AV, follow outpatient  Hypertension - amlodipine, olmesartan  T2DM - ssi -a1c 5.8     DVT prophylaxis: heparin gtt Code Status: full Family Communication: none Disposition:   Status is: Inpatient Remains inpatient appropriate because: need for further cardiology eval   Consultants:  cardiology  Procedures:  Echo IMPRESSIONS      1. Left ventricular ejection fraction, by estimation, is 25%. The left  ventricle has severely decreased function. The left ventricle demonstrates  regional wall motion abnormalities (see scoring diagram/findings for  description). Left ventricular  diastolic parameters are indeterminate.   2. Right ventricular systolic function is mildly reduced. The right  ventricular size is normal. There is mildly elevated pulmonary artery  systolic pressure.   3. Left atrial size was moderately dilated.   4. Right atrial size was moderately dilated.   5. The mitral valve is abnormal. Mild mitral valve regurgitation. No  evidence of mitral stenosis.   6. The tricuspid valve is abnormal. Tricuspid valve regurgitation is mild  to moderate.   7. The aortic valve has an indeterminant number of cusps. There is mild  calcification of the aortic valve. There is mild thickening of the aortic  valve. Aortic valve regurgitation is not visualized. No aortic stenosis is  present.   8. The inferior vena cava is dilated in size with <50% respiratory  variability, suggesting right atrial pressure of 15 mmHg.  Antimicrobials:  Anti-infectives (From admission, onward)    Start     Dose/Rate Route Frequency Ordered Stop   08/18/22 1000  cefTRIAXone (ROCEPHIN) 1 g in sodium chloride 0.9 % 100 mL IVPB  Status:  Discontinued        1 g 200 mL/hr over 30 Minutes Intravenous Every 24 hours 08/17/22 1800 08/17/22 1840   08/18/22 1000  azithromycin (ZITHROMAX) tablet 500 mg  Status:  Discontinued        500 mg Oral Daily 08/17/22 1800 08/18/22 0409  08/18/22 1000  cefTRIAXone (ROCEPHIN) 2 g in sodium chloride 0.9 % 100 mL IVPB        2 g 200 mL/hr over 30 Minutes Intravenous Every 24 hours 08/17/22 1840     08/18/22 0600  doxycycline (VIBRAMYCIN) 100 mg in sodium chloride 0.9 % 250 mL IVPB        100 mg 125 mL/hr over 120 Minutes Intravenous 2 times daily 08/18/22 0409     08/17/22 1245  cefTRIAXone (ROCEPHIN)  2 g in sodium chloride 0.9 % 100 mL IVPB        2 g 200 mL/hr over 30 Minutes Intravenous  Once 08/17/22 1238 08/17/22 1507   08/17/22 1245  azithromycin (ZITHROMAX) 500 mg in sodium chloride 0.9 % 250 mL IVPB        500 mg 250 mL/hr over 60 Minutes Intravenous  Once 08/17/22 1238 08/17/22 1813       Subjective: C/o nausea  Objective: Vitals:   08/18/22 0825 08/18/22 0830 08/18/22 0835 08/18/22 1103  BP:    113/69  Pulse: (!) 39 (!) 49 (!) 41 68  Resp: 18 14 15 17   Temp:    98 F (36.7 C)  TempSrc:    Oral  SpO2: 99% 98% 99% 100%  Weight:      Height:        Intake/Output Summary (Last 24 hours) at 08/18/2022 1517 Last data filed at 08/18/2022 1101 Gross per 24 hour  Intake 366.26 ml  Output --  Net 366.26 ml   Filed Weights   08/17/22 1856 08/18/22 0341  Weight: 77.6 kg 80.4 kg    Examination:  General exam: Appears calm and comfortable  Respiratory system: unlabored Cardiovascular system: S1 & S2 heard, RRR.  Gastrointestinal system: Abdomen is nondistended, soft and nontender. Central nervous system: Alert and oriented. No focal neurological deficits. Extremities: no LEE   Data Reviewed: I have personally reviewed following labs and imaging studies  CBC: Recent Labs  Lab 08/17/22 1028  WBC 12.5*  NEUTROABS 10.6*  HGB 10.5*  HCT 33.0*  MCV 90.7  PLT [id number]    Basic Metabolic Panel: Recent Labs  Lab 08/17/22 1028 08/18/22 0038 08/18/22 0620  NA 136 133*  --   K 4.1 4.7  --   CL 99 97*  --   CO2 25 22  --   GLUCOSE 152* 204*  --   BUN 13 19  --   CREATININE 0.89 1.10*  --   CALCIUM 9.4 9.0  --   MG  --   --  1.6*    GFR: Estimated Creatinine Clearance: 39.9 mL/min (Anna [name]) (by C-G formula based on SCr of 1.1 mg/dL (H)).  Liver Function Tests: Recent Labs  Lab 08/17/22 1028  AST 24  ALT 13  ALKPHOS 114  BILITOT 0.9  PROT 6.9  ALBUMIN 3.4*    CBG: Recent Labs  Lab 08/17/22 2218 08/18/22 0629 08/18/22 1100  GLUCAP 232* 166* 86      Recent Results (from the past 240 hour(s))  Resp Panel by RT-PCR (Flu Anna [name]&B, Covid) Anterior Nasal Swab     Status: None   Collection Time: 08/17/22 10:09 AM   Specimen: Anterior Nasal Swab  Result Value Ref Range Status   SARS Coronavirus 2 by RT PCR NEGATIVE NEGATIVE Final    Comment: (NOTE) SARS-CoV-2 target nucleic acids are NOT DETECTED.  The SARS-CoV-2 RNA is generally detectable in upper respiratory specimens during the acute phase of infection. The lowest concentration of  SARS-CoV-2 viral copies this assay can detect is 138 copies/mL. Kaelob Persky negative result does not preclude SARS-Cov-2 infection and should not be used as the sole basis for treatment or other patient management decisions. Caedyn Tassinari negative result may occur with  improper specimen collection/handling, submission of specimen other than nasopharyngeal swab, presence of viral mutation(s) within the areas targeted by this assay, and inadequate number of viral copies(<138 copies/mL). Burnette Sautter negative result must be combined with clinical observations, patient history, and epidemiological information. The expected result is Negative.  Fact Sheet for Patients:  EntrepreneurPulse.com.au  Fact Sheet for Healthcare Providers:  IncredibleEmployment.be  This test is no t yet approved or cleared by the [place] FDA and  has been authorized for detection and/or diagnosis of SARS-CoV-2 by FDA under an Emergency Use Authorization (EUA). This EUA will remain  in effect (meaning this test can be used) for the duration of the COVID-19 declaration under Section 564(b)(1) of the Act, 21 U.S.C.section 360bbb-3(b)(1), unless the authorization is terminated  or revoked sooner.       Influenza Melven Stockard by PCR NEGATIVE NEGATIVE Final   Influenza B by PCR NEGATIVE NEGATIVE Final    Comment: (NOTE) The Xpert Xpress SARS-CoV-2/FLU/RSV plus assay is intended as an aid in the diagnosis of influenza from  Nasopharyngeal swab specimens and should not be used as Anna Badalamenti sole basis for treatment. Nasal washings and aspirates are unacceptable for Xpert Xpress SARS-CoV-2/FLU/RSV testing.  Fact Sheet for Patients: EntrepreneurPulse.com.au  Fact Sheet for Healthcare Providers: IncredibleEmployment.be  This test is not yet approved or cleared by the [place] FDA and has been authorized for detection and/or diagnosis of SARS-CoV-2 by FDA under an Emergency Use Authorization (EUA). This EUA will remain in effect (meaning this test can be used) for the duration of the COVID-19 declaration under Section 564(b)(1) of the Act, 21 U.S.C. section 360bbb-3(b)(1), unless the authorization is terminated or revoked.  Performed at [facility], [place] [address] [place], [place] [address]   Culture, blood (routine x 2)     Status: None (Preliminary result)   Collection Time: 08/17/22 12:38 PM   Specimen: BLOOD  Result Value Ref Range Status   Specimen Description BLOOD RIGHT ANTECUBITAL  Final   Special Requests   Final    BOTTLES DRAWN AEROBIC AND ANAEROBIC Blood Culture adequate volume   Culture   Final    NO GROWTH < 24 HOURS Performed at [facility], [place] [address], [place], [place] [address]    Report Status PENDING  Incomplete  Culture, blood (routine x 2)     Status: None (Preliminary result)   Collection Time: 08/17/22 12:43 PM   Specimen: BLOOD  Result Value Ref Range Status   Specimen Description BLOOD BLOOD RIGHT HAND  Final   Special Requests   Final    BOTTLES DRAWN AEROBIC AND ANAEROBIC Blood Culture results may not be optimal due to an inadequate volume of blood received in culture bottles   Culture   Final    NO GROWTH < 24 HOURS Performed at [facility], [place] [address] [place], [place] [address]    Report Status PENDING  Incomplete         Radiology Studies: ECHOCARDIOGRAM COMPLETE  Result Date: 08/18/2022     ECHOCARDIOGRAM REPORT   Patient Name:   Anna [name] Date of Exam: 08/18/2022 Medical Rec #:  [id number]      Height:       65.0 in Accession #:  [id number]     Weight:       177.2 lb Date of Birth:  1936-06-08      BSA:          1.879 m Patient Age:    89 years       BP:           113/69 mmHg Patient Gender: F              HR:           60 bpm. Exam Location:  Inpatient Procedure: 2D Echo, Color Doppler, Cardiac Doppler and Intracardiac            Opacification Agent Indications:    NSTEMI i21.4  History:        Patient has prior history of Echocardiogram examinations, most                 recent 05/06/2022. Arrythmias:Atrial Fibrillation; Risk                 Factors:Hypertension and Dyslipidemia.  Sonographer:    Anna [name] Senior RDCS Referring Phys: [id number] Amasa  1. Left ventricular ejection fraction, by estimation, is 25%. The left ventricle has severely decreased function. The left ventricle demonstrates regional wall motion abnormalities (see scoring diagram/findings for description). Left ventricular diastolic parameters are indeterminate.  2. Right ventricular systolic function is mildly reduced. The right ventricular size is normal. There is mildly elevated pulmonary artery systolic pressure.  3. Left atrial size was moderately dilated.  4. Right atrial size was moderately dilated.  5. The mitral valve is abnormal. Mild mitral valve regurgitation. No evidence of mitral stenosis.  6. The tricuspid valve is abnormal. Tricuspid valve regurgitation is mild to moderate.  7. The aortic valve has an indeterminant number of cusps. There is mild calcification of the aortic valve. There is mild thickening of the aortic valve. Aortic valve regurgitation is not visualized. No aortic stenosis is present.  8. The inferior vena cava is dilated in size with <50% respiratory variability, suggesting right atrial pressure of 15 mmHg. FINDINGS  Left Ventricle: Left ventricular ejection fraction, by  estimation, is 25%. The left ventricle has severely decreased function. The left ventricle demonstrates regional wall motion abnormalities. The left ventricular internal cavity size was normal in size. There is no left ventricular hypertrophy. Left ventricular diastolic parameters are indeterminate.  LV Wall Scoring: The mid and distal anterior septum and entire apex are akinetic. Right Ventricle: The right ventricular size is normal. Right vetricular wall thickness was not well visualized. Right ventricular systolic function is mildly reduced. There is mildly elevated pulmonary artery systolic pressure. The tricuspid regurgitant velocity is 2.38 m/s, and with an assumed right atrial pressure of 15 mmHg, the estimated right ventricular systolic pressure is 0000000 mmHg. Left Atrium: Left atrial size was moderately dilated. Right Atrium: Right atrial size was moderately dilated. Pericardium: There is no evidence of pericardial effusion. Mitral Valve: The mitral valve is abnormal. There is mild thickening of the mitral valve leaflet(s). There is mild calcification of the mitral valve leaflet(s). Mild mitral annular calcification. Mild mitral valve regurgitation. No evidence of mitral valve stenosis. MV peak gradient, 10.5 mmHg. The mean mitral valve gradient is 2.7 mmHg. Tricuspid Valve: The tricuspid valve is abnormal. Tricuspid valve regurgitation is mild to moderate. No evidence of tricuspid stenosis. Aortic Valve: The aortic valve has an indeterminant number of cusps. There is mild calcification of the aortic valve. There is mild thickening of  the aortic valve. There is mild aortic valve annular calcification. Aortic valve regurgitation is not visualized. No aortic stenosis is present. Aortic valve mean gradient measures 3.1 mmHg. Aortic valve peak gradient measures 6.2 mmHg. Aortic valve area, by VTI measures 2.44 cm. Pulmonic Valve: The pulmonic valve was not well visualized. Pulmonic valve regurgitation is not  visualized. No evidence of pulmonic stenosis. Aorta: The aortic root is normal in size and structure. Venous: The inferior vena cava is dilated in size with less than 50% respiratory variability, suggesting right atrial pressure of 15 mmHg. IAS/Shunts: The interatrial septum was not well visualized.  LEFT VENTRICLE PLAX 2D LVIDd:         4.60 cm      Diastology LVIDs:         2.60 cm      LV e' medial:  4.90 cm/s LV PW:         1.00 cm      LV e' lateral: 7.94 cm/s LV IVS:        0.80 cm LVOT diam:     1.80 cm LV SV:         69 LV SV Index:   37 LVOT Area:     2.54 cm  LV Volumes (MOD) LV vol d, MOD A2C: 115.0 ml LV vol d, MOD A4C: 90.0 ml LV vol s, MOD A2C: 68.4 ml LV vol s, MOD A4C: 52.4 ml LV SV MOD A2C:     46.6 ml LV SV MOD A4C:     90.0 ml LV SV MOD BP:      41.9 ml RIGHT VENTRICLE RV S prime:     7.83 cm/s TAPSE (M-mode): 1.5 cm LEFT ATRIUM             Index        RIGHT ATRIUM           Index LA diam:        5.00 cm 2.66 cm/m   RA Area:     26.30 cm LA Vol (A2C):   85.6 ml 45.55 ml/m  RA Volume:   81.40 ml  43.32 ml/m LA Vol (A4C):   73.8 ml 39.27 ml/m LA Biplane Vol: 80.5 ml 42.84 ml/m  AORTIC VALVE AV Area (Vmax):    2.11 cm AV Area (Vmean):   2.26 cm AV Area (VTI):     2.44 cm AV Vmax:           124.10 cm/s AV Vmean:          84.326 cm/s AV VTI:            0.282 m AV Peak Grad:      6.2 mmHg AV Mean Grad:      3.1 mmHg LVOT Vmax:         103.00 cm/s LVOT Vmean:        74.900 cm/s LVOT VTI:          0.270 m LVOT/AV VTI ratio: 0.96  AORTA Ao Root diam: 3.20 cm Ao Asc diam:  3.20 cm MITRAL VALVE              TRICUSPID VALVE MV Area (PHT): 1.47 cm   TR Peak grad:   22.7 mmHg MV Area VTI:   1.53 cm   TR Vmax:        238.00 cm/s MV Peak grad:  10.5 mmHg MV Mean grad:  2.7 mmHg   SHUNTS MV Vmax:  1.62 m/s   Systemic VTI:  0.27 m MV Vmean:      69.5 cm/s  Systemic Diam: 1.80 cm Anna Dolly MD Electronically signed by Anna Dolly MD Signature Date/Time: 08/18/2022/1:46:08 PM    Final     CT CHEST ABDOMEN PELVIS W CONTRAST  Result Date: 08/17/2022 CLINICAL DATA:  abdominal pain, pneumonia EXAM: CT CHEST, ABDOMEN, AND PELVIS WITH CONTRAST TECHNIQUE: Multidetector CT imaging of the chest, abdomen and pelvis was performed following the standard protocol during bolus administration of intravenous contrast. RADIATION DOSE REDUCTION: This exam was performed according to the departmental dose-optimization program which includes automated exposure control, adjustment of the mA and/or kV according to patient size and/or use of iterative reconstruction technique. CONTRAST:  151mL OMNIPAQUE IOHEXOL 350 MG/ML SOLN COMPARISON:  Chest x-ray 03/19/2021 FINDINGS: CT CHEST FINDINGS Cardiovascular: Enlarged heart size. No significant pericardial effusion. The thoracic aorta is normal in caliber. Mild-to-moderate atherosclerotic plaque of the thoracic aorta. Four-vessel coronary artery calcifications. Mitral annular calcification. The main pulmonary artery measures at the upper limits of normal. No central or segmental pulmonary embolus. Mediastinum/Nodes: Right hilar lymphadenopathy measuring up to 1.3 cm (3:27). Mediastinal lymphadenopathy with as an example Anna [name] precarinal 1.4 cm lymph node (3:22). No enlarged mediastinal, hilar, or axillary lymph nodes. Thyroid gland, trachea, and esophagus demonstrate no significant findings. Lungs/Pleura: Interlobular septal wall thickening. Bilateral lower lobe peribronchovascular ground-glass airspace opacity and consolidation, right greater than left. Right upper lobe linear atelectasis versus scarring for. Right upper lobe 6 mm pulmonary nodule. Right upper lobe 1.2 x 0.7 cm pulmonary nodule. Scattered right upper lobe peribronchovascular ground-glass airspace opacities. No pulmonary mass. Trace bilateral pleural effusions. No pneumothorax. Musculoskeletal: No chest wall abnormality. No suspicious lytic or blastic osseous lesions. Chronic T12 anterior wedge compression  fracture with at least 20% vertebral body height loss. Multilevel degenerative changes of the spine. CT ABDOMEN PELVIS FINDINGS Hepatobiliary: No focal liver abnormality. Layering hyperdensity within the gallbladder lumen likely calcified gallstones noted within the gallbladder lumen. No gallbladder wall thickening or pericholecystic fluid. No biliary dilatation. Pancreas: No focal lesion. Normal pancreatic contour. No surrounding inflammatory changes. No main pancreatic ductal dilatation. Spleen: Normal in size without focal abnormality. Adrenals/Urinary Tract: No adrenal nodule bilaterally. Bilateral kidneys enhance symmetrically. Right renal cortical scarring. Couple of fluid density lesions within the right kidney likely represent simple renal cysts. Simple renal cysts, in the absence of clinically indicated signs/symptoms, require no independent follow-up. Subcentimeter hypodensities are too small to characterize. No hydronephrosis. No hydroureter. The urinary bladder is unremarkable. Stomach/Bowel: Stomach is within normal limits. No evidence of bowel wall thickening or dilatation. Colonic diverticulosis with no acute diverticulitis. Appendix appears normal. Vascular/Lymphatic: No abdominal aorta or iliac aneurysm. At least moderate atherosclerotic plaque of the aorta and its branches. No abdominal, pelvic, or inguinal lymphadenopathy. Reproductive: Slightly heterogeneous, enhancing, and enlarged left ovary measuring 3.8 x 2.5 cm of unclear etiology. Uterus and bilateral adnexa are unremarkable. Other: No intraperitoneal free fluid. No intraperitoneal free gas. No organized fluid collection. Vague slight heterogeneity and fat stranding of the right pericolic gutter peritoneum of unclear etiology (3:70 4-78). Musculoskeletal: No abdominal wall hernia or abnormality. No suspicious lytic or blastic osseous lesions. No acute displaced fracture. Multilevel degenerative changes of the spine most prominent at the  L3-L4 level. Mild retrolisthesis of L1 on L2 and L2 on L3. IMPRESSION: 1. Cardiomegaly. 2. Multifocal pneumonia most prominent within the right lower lobe. 3. Trace bilateral pleural effusions. 4. Mediastinal and right hilar lymphadenopathy likely reactive in  etiology. Recommend attention on follow-up. 5. Anna [name] 1.2 cm x 0.7 cm and Anna [name] 0.6 cm right upper lobe pulmonary nodules. Non-contrast chest CT at 3-6 months is recommended. If the nodules are stable at time of repeat CT, then future CT at 18-24 months (from today's scan) is considered optional for low-risk patients, but is recommended for high-risk patients. This recommendation follows the consensus statement: Guidelines for Management of Incidental Pulmonary Nodules Detected on CT Images: From the Fleischner Society 2017; Radiology 2017; 284:228-243. 6. Slightly heterogeneous, hyperdense, and enlarged left ovary measuring 3.8 x 2.5 cm of unclear etiology. Recommend pelvic ultrasound for further evaluation. 7. Vague slight heterogeneity and fat stranding of the right pericolic gutter peritoneum of unclear etiology. Recommend correlation with prior cross-sectional imaging. Attention on follow-up. 8. Cholelithiasis with no CT evidence of acute cholecystitis. 9. Aortic Atherosclerosis (ICD10-I70.0) including four-vessel coronary calcification and mitral annular calcification. Electronically Signed   By: Anna [name] M.D.   On: 08/17/2022 18:52   DG Chest 2 View  Result Date: 08/17/2022 CLINICAL DATA:  Pneumonia EXAM: CHEST - 2 VIEW COMPARISON:  CXR 07/19/21 FINDINGS: No pleural effusion. No pneumothorax. Enlarged cardiac contours. Normal mediastinal contours. No displaced rib fractures. Visualized upper abdomen is unremarkable. There is Anna [name] hazy opacity at the right lung base superimposed on Anna [name] Sandeen background of prominent interstitial opacities. Vertebral body heights are maintained. IMPRESSION: Hazy opacity at the right lung base superimposed on Anna [name] background of  prominent interstitial opacities. Findings could represent pneumonia on Anna [name] background of mild pulmonary edema. In the appropriate clinical setting. Electronically Signed   By: Anna [name] M.D.   On: 08/17/2022 11:22        Scheduled Meds:  amLODipine  5 mg Oral Daily   aspirin  324 mg Oral NOW   Or   aspirin  300 mg Rectal NOW   aspirin EC  81 mg Oral Daily   benzonatate  100 mg Oral BID   diclofenac Sodium  2 g Topical QID   docusate sodium  100 mg Oral Daily   ferrous sulfate  325 mg Oral Q breakfast   furosemide  40 mg Intravenous BID   gabapentin  100 mg Oral TID   guaiFENesin  1,200 mg Oral BID   influenza vaccine adjuvanted  0.5 mL Intramuscular Tomorrow-1000   insulin aspart  0-15 Units Subcutaneous TID WC   ipratropium-albuterol  3 mL Nebulization Q6H   rosuvastatin  40 mg Oral Daily   Continuous Infusions:  cefTRIAXone (ROCEPHIN)  IV 2 g (08/18/22 1101)   doxycycline (VIBRAMYCIN) IV Stopped (08/18/22 1033)   heparin 850 Units/hr (08/18/22 1101)     LOS: 1 day    Time spent: over 30 min    Fayrene Helper, MD [facility]   To contact the attending provider between 7A-7P or the covering provider during after hours 7P-7A, please log into the web site [email or web address] and access using universal [place] password for that web site. If you do not have the password, please call the hospital operator.  08/18/2022, 3:17 PM

## 2022-08-18 NOTE — Progress Notes (Signed)
Her HR consistently dropping into the upper 30 s. She said she's fine when this RN asked how she felt. BP IS 113/62 Dr. Marlowe Sax notified again and she stated there's noting to be done unless she becomes symptomatic. Will continue to monitor.

## 2022-08-18 NOTE — Progress Notes (Signed)
ANTICOAGULATION CONSULT NOTE\  Pharmacy Consult for Heparin Indication: chest pain/ACS  No Known Allergies  Patient Measurements: Height: 5\' 5"  (165.1 cm) Weight: 80.4 kg (177 lb 4 oz) IBW/kg (Calculated) : 57 Heparin Dosing Weight: 73 kg  Vital Signs: Temp: 98 F (36.7 C) (10/22 1103) Temp Source: Oral (10/22 1103) BP: 113/69 (10/22 1103) Pulse Rate: 68 (10/22 1103)  Labs: Recent Labs    08/17/22 1028 08/17/22 1440 08/18/22 0038 08/18/22 0620 08/18/22 1452  HGB 10.5*  --   --   --   --   HCT 33.0*  --   --   --   --   PLT 263  --   --   --   --   APTT  --   --  54* 100* 88*  HEPARINUNFRC  --   --  >1.10* >1.10*  --   CREATININE 0.89  --  1.10*  --   --   TROPONINIHS 158* 2,067* 5,091* 4,331*  --      Estimated Creatinine Clearance: 39.9 mL/min (A) (by C-G formula based on SCr of 1.1 mg/dL (H)).   Medical History: Past Medical History:  Diagnosis Date   Hyperlipidemia    Hypertension    Mitral regurgitation    mild to moderate by echo 04/2022   Mitral stenosis    mild by echo 04/2022   Osteoarthritis    Overweight(278.02)    Permanent atrial fibrillation (HCC)     Medications:  Medications Prior to Admission  Medication Sig Dispense Refill Last Dose   amlodipine-olmesartan (AZOR) 10-20 MG tablet TAKE 1 TABLET BY MOUTH EVERY DAY 90 tablet 1    apixaban (ELIQUIS) 5 MG TABS tablet Take 1 tablet (5 mg total) by mouth 2 (two) times daily. 60 tablet 11    cyclobenzaprine (FLEXERIL) 10 MG tablet Take 0.5 tablets (5 mg total) by mouth 2 (two) times daily as needed for muscle spasms. 20 tablet 0    diclofenac Sodium (VOLTAREN) 1 % GEL Apply 2 grams to knees 4 times a day as needed. 400 g 1    docusate sodium (COLACE) 100 MG capsule Take 100 mg by mouth daily.       ferrous sulfate 325 (65 FE) MG tablet TAKE 1 TABLET BY MOUTH EVERY DAY WITH BREAKFAST 100 tablet 1    furosemide (LASIX) 20 MG tablet 1/2 TAB BY MOUTH TWICE AS NEEDED FOR SWELLING IN THE LEGS. 30 tablet  0    gabapentin (NEURONTIN) 100 MG capsule TAKE 1 CAPSULE BY MOUTH THREE TIMES A DAY 270 capsule 0    HYDROcodone-acetaminophen (NORCO/VICODIN) 5-325 MG tablet Take 1 tablet by mouth every 6 (six) hours as needed for moderate pain.      metFORMIN (GLUCOPHAGE) 500 MG tablet TAKE 1 TABLET BY MOUTH EVERY DAY WITH BREAKFAST 90 tablet 1    Misc. Devices Careers adviser. Devices MISC Rollator walker 1 Device 0    Misc. [facility] bed 1 Device 0    Omega-3 Fatty Acids (FISH OIL CONCENTRATE PO) Take 1 capsule by mouth daily.       rosuvastatin (CRESTOR) 10 MG tablet TAKE 1 TABLET BY MOUTH EVERY DAY 90 tablet 3    XARELTO 20 MG TABS tablet TAKE 1 TABLET EVERY DAY WITH SUPPER..DUE FOR OFFICE VISIT 90 tablet 1     Scheduled:   amLODipine  5 mg Oral Daily   aspirin  324 mg Oral NOW   Or  aspirin  300 mg Rectal NOW   [START ON 08/19/2022] aspirin  81 mg Oral Pre-Cath   [START ON 08/20/2022] aspirin EC  81 mg Oral Daily   benzonatate  100 mg Oral BID   diclofenac Sodium  2 g Topical QID   docusate sodium  100 mg Oral Daily   ferrous sulfate  325 mg Oral Q breakfast   furosemide  40 mg Intravenous BID   gabapentin  100 mg Oral TID   guaiFENesin  1,200 mg Oral BID   influenza vaccine adjuvanted  0.5 mL Intramuscular Tomorrow-1000   insulin aspart  0-15 Units Subcutaneous TID WC   ipratropium-albuterol  3 mL Nebulization Q6H   rosuvastatin  40 mg Oral Daily   sodium chloride flush  3 mL Intravenous Q12H   Infusions:   sodium chloride     [START ON 08/19/2022] sodium chloride     cefTRIAXone (ROCEPHIN)  IV Stopped (08/18/22 1131)   doxycycline (VIBRAMYCIN) IV Stopped (08/18/22 1033)   heparin 850 Units/hr (08/18/22 1600)   PRN: sodium chloride, acetaminophen, cyclobenzaprine, HYDROcodone-acetaminophen, LORazepam, nitroGLYCERIN, sodium chloride flush  Assessment: 60 yof with a history of HLD, HTN, mitral regurg, mitral stenosis, AF on apixaban. Patient  is presenting with cough and CP. Heparin per pharmacy consult placed for chest pain/ACS. Patient is on apixaban prior to arrival. Last dose 10/20pm. Will require aPTT monitoring due to falsely high anti-Xa level secondary to DOAC use.   First heparin level drawn too early and patient lost IV access on 10/21 from ~2200-2300. Re-timed aPTT returned as therapeutic at 100 at upper end of range. Heparin level >1.10 falsely elevated due to recent DOAC. Since new IV access, no issues with infusion or bleeding per RN.   CBC not ordered for this morning but previous hemoglobin was 10.5 and PLT 263. hsTrop 4331.   PM f/u > aPTT 88 - in goal range.  No overt bleeding or complications noted.  Goal of Therapy:  Heparin level 0.3-0.7 units/ml aPTT 66-102 seconds Monitor platelets by anticoagulation protocol: Yes   Plan:  Continue IV heparin at 850 units/hr. Check aPTT & anti-Xa level, CBC daily while on heparin Continue to monitor via aPTT until levels are correlated Monitor for s/sx bleeding   [place] Crane, Roylene Reason, BCCP Clinical Pharmacist  08/18/2022 4:22 PM   [facility] pharmacy phone numbers are listed on [email or web address]

## 2022-08-18 NOTE — Progress Notes (Signed)
ANTICOAGULATION CONSULT NOTE - Initial Consult  Pharmacy Consult for Heparin Indication: chest pain/ACS  No Known Allergies  Patient Measurements: Height: 5\' 5"  (165.1 cm) Weight: 80.4 kg (177 lb 4 oz) IBW/kg (Calculated) : 57 Heparin Dosing Weight: 73 kg  Vital Signs: Temp: 97.9 F (36.6 C) (10/22 0735) Temp Source: Oral (10/22 0735) BP: 112/60 (10/22 0735) Pulse Rate: 63 (10/22 0735)  Labs: Recent Labs    08/17/22 1028 08/17/22 1440 08/18/22 0038 08/18/22 0620  HGB 10.5*  --   --   --   HCT 33.0*  --   --   --   PLT 263  --   --   --   APTT  --   --  54* 100*  HEPARINUNFRC  --   --  >1.10* >1.10*  CREATININE 0.89  --  1.10*  --   TROPONINIHS 158* 2,067* 5,091* 4,331*     Estimated Creatinine Clearance: 39.9 mL/min (A) (by C-G formula based on SCr of 1.1 mg/dL (H)).   Medical History: Past Medical History:  Diagnosis Date   Hyperlipidemia    Hypertension    Mitral regurgitation    mild to moderate by echo 04/2022   Mitral stenosis    mild by echo 04/2022   Osteoarthritis    Overweight(278.02)    Permanent atrial fibrillation (HCC)     Medications:  Medications Prior to Admission  Medication Sig Dispense Refill Last Dose   amlodipine-olmesartan (AZOR) 10-20 MG tablet TAKE 1 TABLET BY MOUTH EVERY DAY 90 tablet 1    apixaban (ELIQUIS) 5 MG TABS tablet Take 1 tablet (5 mg total) by mouth 2 (two) times daily. 60 tablet 11    cyclobenzaprine (FLEXERIL) 10 MG tablet Take 0.5 tablets (5 mg total) by mouth 2 (two) times daily as needed for muscle spasms. 20 tablet 0    diclofenac Sodium (VOLTAREN) 1 % GEL Apply 2 grams to knees 4 times a day as needed. 400 g 1    docusate sodium (COLACE) 100 MG capsule Take 100 mg by mouth daily.       ferrous sulfate 325 (65 FE) MG tablet TAKE 1 TABLET BY MOUTH EVERY DAY WITH BREAKFAST 100 tablet 1    furosemide (LASIX) 20 MG tablet 1/2 TAB BY MOUTH TWICE AS NEEDED FOR SWELLING IN THE LEGS. 30 tablet 0    gabapentin (NEURONTIN)  100 MG capsule TAKE 1 CAPSULE BY MOUTH THREE TIMES A DAY 270 capsule 0    HYDROcodone-acetaminophen (NORCO/VICODIN) 5-325 MG tablet Take 1 tablet by mouth every 6 (six) hours as needed for moderate pain.      metFORMIN (GLUCOPHAGE) 500 MG tablet TAKE 1 TABLET BY MOUTH EVERY DAY WITH BREAKFAST 90 tablet 1    Misc. Devices Careers adviser. Devices MISC Rollator walker 1 Device 0    Misc. [facility] bed 1 Device 0    Omega-3 Fatty Acids (FISH OIL CONCENTRATE PO) Take 1 capsule by mouth daily.       rosuvastatin (CRESTOR) 10 MG tablet TAKE 1 TABLET BY MOUTH EVERY DAY 90 tablet 3    XARELTO 20 MG TABS tablet TAKE 1 TABLET EVERY DAY WITH SUPPER..DUE FOR OFFICE VISIT 90 tablet 1     Scheduled:   amLODipine  5 mg Oral Daily   And   irbesartan  150 mg Oral Daily   aspirin  324 mg Oral NOW   Or   aspirin  300 mg Rectal NOW  aspirin EC  81 mg Oral Daily   benzonatate  100 mg Oral BID   diclofenac Sodium  2 g Topical QID   docusate sodium  100 mg Oral Daily   ferrous sulfate  325 mg Oral Q breakfast   gabapentin  100 mg Oral TID   guaiFENesin  1,200 mg Oral BID   influenza vaccine adjuvanted  0.5 mL Intramuscular Tomorrow-1000   insulin aspart  0-15 Units Subcutaneous TID WC   ipratropium-albuterol  3 mL Nebulization Q6H   rosuvastatin  40 mg Oral Daily   Infusions:   cefTRIAXone (ROCEPHIN)  IV     doxycycline (VIBRAMYCIN) IV     heparin 900 Units/hr (08/17/22 2305)   PRN: acetaminophen, cyclobenzaprine, HYDROcodone-acetaminophen, nitroGLYCERIN  Assessment: 45 yof with a history of HLD, HTN, mitral regurg, mitral stenosis, AF on apixaban. Patient is presenting with cough and CP. Heparin per pharmacy consult placed for chest pain/ACS. Patient is on apixaban prior to arrival. Last dose 10/20pm. Will require aPTT monitoring due to falsely high anti-Xa level secondary to DOAC use.   First heparin level drawn too early and patient lost IV access on 10/21  from ~2200-2300. Re-timed aPTT returned as therapeutic at 100 at upper end of range. Heparin level >1.10 falsely elevated due to recent DOAC. Since new IV access, no issues with infusion or bleeding per RN.   CBC not ordered for this morning but previous hemoglobin was 10.5 and PLT 263. hsTrop 4331.   Goal of Therapy:  Heparin level 0.3-0.7 units/ml aPTT 66-102 seconds Monitor platelets by anticoagulation protocol: Yes   Plan:  Decrease heparin slightly to 850 units/hr to target middle of therapeutic range Check 8 hour confirmatory aPTT Check aPTT & anti-Xa level, CBC daily while on heparin Continue to monitor via aPTT until levels are correlated Monitor for s/sx bleeding   Eliseo Gum, PharmD PGY1 Pharmacy Resident   08/18/2022  8:12 AM

## 2022-08-18 NOTE — Progress Notes (Signed)
Paged  the on call Cardiologist and awaiting a call back.

## 2022-08-18 NOTE — Progress Notes (Signed)
The  patient's HR  mostly stays in the upper 50s but I have seen 37 on the monitor non sustaining.  Her HR went to the 70s when I woke her up. No s/s of it. BP is stable. Notified Dr. Marlowe Sax and awaiting for response.

## 2022-08-18 NOTE — Progress Notes (Signed)
Dr. Alfred Levins has not responded to my page but patient is fine right now.

## 2022-08-19 ENCOUNTER — Inpatient Hospital Stay (HOSPITAL_COMMUNITY): Payer: Medicare Other

## 2022-08-19 DIAGNOSIS — I214 Non-ST elevation (NSTEMI) myocardial infarction: Secondary | ICD-10-CM | POA: Diagnosis not present

## 2022-08-19 DIAGNOSIS — I4892 Unspecified atrial flutter: Secondary | ICD-10-CM | POA: Diagnosis not present

## 2022-08-19 DIAGNOSIS — N179 Acute kidney failure, unspecified: Secondary | ICD-10-CM | POA: Diagnosis not present

## 2022-08-19 DIAGNOSIS — I5021 Acute systolic (congestive) heart failure: Secondary | ICD-10-CM | POA: Diagnosis not present

## 2022-08-19 LAB — LEGIONELLA PNEUMOPHILA SEROGP 1 UR AG: L. pneumophila Serogp 1 Ur Ag: NEGATIVE

## 2022-08-19 LAB — GLUCOSE, CAPILLARY
Glucose-Capillary: 147 mg/dL — ABNORMAL HIGH (ref 70–99)
Glucose-Capillary: 119 mg/dL — ABNORMAL HIGH (ref 70–99)
Glucose-Capillary: 142 mg/dL — ABNORMAL HIGH (ref 70–99)
Glucose-Capillary: 172 mg/dL — ABNORMAL HIGH (ref 70–99)

## 2022-08-19 LAB — CBC WITH DIFFERENTIAL/PLATELET
Abs Immature Granulocytes: 0.05 10*3/uL (ref 0.00–0.07)
Basophils Absolute: 0.1 10*3/uL (ref 0.0–0.1)
Basophils Relative: 1 %
Eosinophils Absolute: 0.2 10*3/uL (ref 0.0–0.5)
Eosinophils Relative: 2 %
HCT: 29.8 % — ABNORMAL LOW (ref 36.0–46.0)
Hemoglobin: 9.7 g/dL — ABNORMAL LOW (ref 12.0–15.0)
Immature Granulocytes: 1 %
Lymphocytes Relative: 24 %
Lymphs Abs: 2.6 10*3/uL (ref 0.7–4.0)
MCH: 28.6 pg (ref 26.0–34.0)
MCHC: 32.6 g/dL (ref 30.0–36.0)
MCV: 87.9 fL (ref 80.0–100.0)
Monocytes Absolute: 0.9 10*3/uL (ref 0.1–1.0)
Monocytes Relative: 8 %
Neutro Abs: 7.2 10*3/uL (ref 1.7–7.7)
Neutrophils Relative %: 64 %
Platelets: 247 10*3/uL (ref 150–400)
RBC: 3.39 MIL/uL — ABNORMAL LOW (ref 3.87–5.11)
RDW: 13.6 % (ref 11.5–15.5)
WBC: 11 10*3/uL — ABNORMAL HIGH (ref 4.0–10.5)
nRBC: 0 % (ref 0.0–0.2)

## 2022-08-19 LAB — MAGNESIUM: Magnesium: 2 mg/dL (ref 1.7–2.4)

## 2022-08-19 LAB — COMPREHENSIVE METABOLIC PANEL
ALT: 23 U/L (ref 0–44)
AST: 49 U/L — ABNORMAL HIGH (ref 15–41)
Albumin: 2.8 g/dL — ABNORMAL LOW (ref 3.5–5.0)
Alkaline Phosphatase: 92 U/L (ref 38–126)
Anion gap: 11 (ref 5–15)
BUN: 29 mg/dL — ABNORMAL HIGH (ref 8–23)
CO2: 22 mmol/L (ref 22–32)
Calcium: 8.5 mg/dL — ABNORMAL LOW (ref 8.9–10.3)
Chloride: 97 mmol/L — ABNORMAL LOW (ref 98–111)
Creatinine, Ser: 1.52 mg/dL — ABNORMAL HIGH (ref 0.44–1.00)
GFR, Estimated: 34 mL/min — ABNORMAL LOW (ref >60)
Glucose, Bld: 137 mg/dL — ABNORMAL HIGH (ref 70–99)
Potassium: 4 mmol/L (ref 3.5–5.1)
Sodium: 130 mmol/L — ABNORMAL LOW (ref 135–145)
Total Bilirubin: 0.7 mg/dL (ref 0.3–1.2)
Total Protein: 6 g/dL — ABNORMAL LOW (ref 6.5–8.1)

## 2022-08-19 LAB — HEPARIN LEVEL (UNFRACTIONATED): Heparin Unfractionated: >1.1 IU/mL — ABNORMAL HIGH (ref 0.30–0.70)

## 2022-08-19 LAB — APTT: aPTT: 68 seconds — ABNORMAL HIGH (ref 24–36)

## 2022-08-19 LAB — PHOSPHORUS: Phosphorus: 3.6 mg/dL (ref 2.5–4.6)

## 2022-08-19 MED ORDER — ALBUTEROL SULFATE (2.5 MG/3ML) 0.083% IN NEBU: 2.5000 mg | INHALATION_SOLUTION | RESPIRATORY_TRACT | Status: DC | PRN

## 2022-08-19 MED ORDER — MORPHINE SULFATE (PF) 2 MG/ML IV SOLN
0.5000 mg | INTRAVENOUS | Status: DC | PRN
  Administered 2022-08-19 (×2): 0.5 mg via INTRAVENOUS
  Filled 2022-08-19 (×2): qty 1

## 2022-08-19 MED ORDER — IPRATROPIUM-ALBUTEROL 0.5-2.5 (3) MG/3ML IN SOLN: 3.0000 mL | RESPIRATORY_TRACT | Status: DC | PRN

## 2022-08-19 MED ORDER — BUDESONIDE 0.25 MG/2ML IN SUSP
0.2500 mg | Freq: Two times a day (BID) | RESPIRATORY_TRACT | Status: DC
  Administered 2022-08-19 – 2022-08-24 (×9): 0.25 mg via RESPIRATORY_TRACT
  Filled 2022-08-19 (×10): qty 2

## 2022-08-19 MED ORDER — ARFORMOTEROL TARTRATE 15 MCG/2ML IN NEBU
15.0000 ug | INHALATION_SOLUTION | Freq: Two times a day (BID) | RESPIRATORY_TRACT | Status: DC
  Administered 2022-08-19 – 2022-08-24 (×9): 15 ug via RESPIRATORY_TRACT
  Filled 2022-08-19 (×10): qty 2

## 2022-08-19 MED ORDER — IPRATROPIUM-ALBUTEROL 0.5-2.5 (3) MG/3ML IN SOLN
3.0000 mL | Freq: Four times a day (QID) | RESPIRATORY_TRACT | Status: DC
  Administered 2022-08-19 – 2022-08-20 (×4): 3 mL via RESPIRATORY_TRACT
  Filled 2022-08-19 (×4): qty 3

## 2022-08-19 MED ORDER — METHYLPREDNISOLONE SODIUM SUCC 40 MG IJ SOLR
40.0000 mg | Freq: Every day | INTRAMUSCULAR | Status: DC
  Administered 2022-08-19 – 2022-08-22 (×4): 40 mg via INTRAVENOUS
  Filled 2022-08-19 (×4): qty 1

## 2022-08-19 NOTE — Progress Notes (Incomplete)
Summary Statement 86 yr old female with pmhx of chronic atrial flutter on eliquis, mitral regurg/stenosis, HTN, and HLD presents with a SOB, chest pain, and nausea. Patient found to have a NTSTEMI and CAP.                                        24 Hour Events: T Wave Inversions on EKG    SUBJECTIVE:       Patient having less substernal chest pain today, still having significant cough     VS trends: BP:  Systolic 272 Diastolic 60   536 94  Pulse: 54 76  Respiratory Rate: 13 27  Temp: 97.5 98  SpO2: 95 100%  Weight 80.4 kg    ? Ins 81   Outs 200       PE: General: No acute distress  HEENT: EOM intact, PERRLA, Mucous membranes pink, moist Neck: Supple, JVD present  CV: s1 and s2 heard upon auscultation, no gallops or friction rubs noted  Respiratory: Breath sounds clear and diminished throughout, diminished more in lower bases  Slight increase in WOB, no accessory muscle use  Abdominal: Bowel Sounds active in all quadrants  Neuro: Alert and oriented x 4, cranial nerves II to XII Intact Psych: Cooperative and appropriate for situation    Lab Results:  10/21 BNP 697.2 Troponin 158-->2067-->5,091-->4,331    10/23 CMET Na 130, K 4.0, Cl 97, Co2 22, Glucose 150m BUN 1.52, Calcium 8.5 Anion Gap 11, Phos 3.6, Mag 2.0 ALP 92, Albumin 2.8, AST 49, ALT 23, Total protein 6.0, GFR 34   WBC 11.0. RBC, 3.39, Hgb 9.7, HCT 29.8, MCV 87.9, Plt 247   Heparin 1.10, APTT 68  Imaging Results Other Diagnostic Tests: 10/21-10/23 EKG atrial fib/flutter, intraventricular block, ST and T wave abnormalities n 10/21-CXR pna right lower base with pulmonary edema  10/21 CT scan of chest/abdomen/pelvis Findings noted cardiomegaly  Multifocal PNA, Bilateral pleural effusions Mediastinal and right hilar lypmhadenopaty 1.2cm x 0.7cm and 0.6cm upper lobe pulmonary nodules. Slightly enlarged left ovary Cholelithiasis with no acute cholecystitis Aortic Atherosclerosis    ECHO: LVEF 25%,  Left ventricle with decreased function as well as right ventricular systolic function reduced. Left ventricle has regional wall abnormalities  Left atrium, right atrium are dilated  Mitral valve regurgitation but not evidence of stenosis    ASSESSMENT/PLAN     Summary Statement:  NSTEMI          -A: Troponin of 5,091, substernal chest pain slightly improving and SOB, ST and T wave abnormalities (Twave inversions), regional wall abnormalities with decreased EF 25%  Previous ECHO (7/10) absent regional wall abnormalities/ EF 55-60%         -Plan: Continue Heparin gtt with pharmacy consult         Continue Aspirin         Cardiology consult with recommendation of heart cath, plan for tomorrow due to increased work of respiratory status  HFrEF LVEF 25%, JVD present  Lasix given to assist with fluid overload-held for today due to increase Cr 1.52, BUN 29, and for upcoming heart cath. Patient still will more than likely benefit from diuresis after heart cath Monitor BMETs   Multifocal CAP/respiratory failure CT scan shows multifocal pneumonia Chest x-ray shows right lower lobe pneumonia, contributing to SOB along with heart failure, breath sounds clear but diminished breath sounds throughout Urine PNA antigen negative, Blood  cultures showing no growth, WBC and neutrophil count trending downward -Obtain MRSA PCR  -Continue Rocephin and Doxycycline IVPB   Hyponatremia  Due to Diuretic use/AKI vs infectious process Continue to follow Neuro status  Daily BMETs   Atrial Flutter hx On eliquis but transitioned to Heparin due to NSTEMI  Rate controlled at this time, slow ventricular response   HTN hx  Continue home medications Amlodipine   HLD hx  Continue Crestor  DM2 hx  Sliding scale insulin  CBGs ACHS Heart healthy diet, NPO for heart cath   Abnormal CT findings: Mediastinal and right hilar lypmhadenopaty 1.2cm x 0.7cm and 0.6cm upper lobe pulmonary nodules. Slightly enlarged  left ovary Cholelithiasis with no acute cholecystitis Aortic Atherosclerosis   Follow up outpatient

## 2022-08-19 NOTE — Progress Notes (Signed)
Heart Failure Navigator Progress Note  Following this hospitalization to assess for HV TOC readiness.   EF 25% (New) Left Heart cath planned 08/20/22  Earnestine Leys, BSN, RN Heart Failure Nurse Navigator Secure Chat Only

## 2022-08-19 NOTE — Progress Notes (Signed)
PROGRESS NOTE    Anna [name]  [id number] DOB: January 04, 1936 DOA: 08/17/2022 PCP: Pcp, No  Chief Complaint  Patient presents with   Shortness of Breath    Brief Narrative:  Anna [name] is Anna [name] 86 y.o. female with medical history significant of chronic Anna [name] flutter on Eliquis, mitral regurgitation/stenosis, HTN, HLD, came with new onset of cough shortness of breath chest pain and nausea.  She's been diagnosed with an NSTEMI and community acquired pneumonia.  Assessment & Plan:   Principal Problem:   NSTEMI (non-ST elevated myocardial infarction) [facility]) Active Problems:   Chronic atrial flutter (HCC)   Acute heart failure (HCC)   Assessment and Plan: NSTEMI - troponin peaked at 5091 - appreciate cardiology assistance - heparin gtt, apsirin, crestor, not on beta blocker in setting of bradycardia - LDL 24, A1c 5.8 - echo wth EF 25%, regional wall motion abnormalities, RVSF mildly reduced, AV with indeterminant number of cusps - cath delayed due to her respiratory symptoms today, per caardiology  Acute HFrEF - EF 25% as noted above with RWMA - lasix on hold with AKI today  Acute hypoxic respiratory failure  Multifocal Pneumonia  Wheezing  Cough - CT findings concerning for multifocal pneumonia - continue ceftriaxone/doxycycline - follow blood cultures ngtdx2, sputum culture pending - urine strep negative, urine legionella negative - wheezing today, will start nebs, steroids - morphine available prn for cough if not improving with other meds   Acute Kidney Injury - in setting of lasix, will place lasix on hold and follow  Chronic Anna [name] flutter  -Rate controlled, on heparin drip for NSTEMI.  History of combined mitral stenosis and mitral regurgitation -per cardiology -echo with indeterminate number of cusp of AV, follow outpatient  Hypertension - amlodipine, olmesartan  T2DM - ssi -a1c 5.8  Goals of care - discussed with patient and daughter [name]) at bedside.   Patient wants to be DNR.  They're agreeable to palliative care c/s.   Abnormal Imaging Findings - needs outpatient follow up for mediastinal and right hilar adenopathy, pulmonary nodules, enlarged L ovary, heterogenicity and fat stranding of R pericolic gutter peritoneum     DVT prophylaxis: heparin gtt Code Status: full Family Communication: none Disposition:   Status is: Inpatient Remains inpatient appropriate because: need for further cardiology eval   Consultants:  cardiology  Procedures:  Echo IMPRESSIONS     1. Left ventricular ejection fraction, by estimation, is 25%. The left  ventricle has severely decreased function. The left ventricle demonstrates  regional wall motion abnormalities (see scoring diagram/findings for  description). Left ventricular  diastolic parameters are indeterminate.   2. Right ventricular systolic function is mildly reduced. The right  ventricular size is normal. There is mildly elevated pulmonary artery  systolic pressure.   3. Left atrial size was moderately dilated.   4. Right atrial size was moderately dilated.   5. The mitral valve is abnormal. Mild mitral valve regurgitation. No  evidence of mitral stenosis.   6. The tricuspid valve is abnormal. Tricuspid valve regurgitation is mild  to moderate.   7. The aortic valve has an indeterminant number of cusps. There is mild  calcification of the aortic valve. There is mild thickening of the aortic  valve. Aortic valve regurgitation is not visualized. No aortic stenosis is  present.   8. The inferior vena cava is dilated in size with <50% respiratory  variability, suggesting right atrial pressure of 15 mmHg.  Antimicrobials:  Anti-infectives (From admission, onward)  Start     Dose/Rate Route Frequency Ordered Stop   08/18/22 1000  cefTRIAXone (ROCEPHIN) 1 g in sodium chloride 0.9 % 100 mL IVPB  Status:  Discontinued        1 g 200 mL/hr over 30 Minutes Intravenous Every 24 hours  08/17/22 1800 08/17/22 1840   08/18/22 1000  azithromycin (ZITHROMAX) tablet 500 mg  Status:  Discontinued        500 mg Oral Daily 08/17/22 1800 08/18/22 0409   08/18/22 1000  cefTRIAXone (ROCEPHIN) 2 g in sodium chloride 0.9 % 100 mL IVPB        2 g 200 mL/hr over 30 Minutes Intravenous Every 24 hours 08/17/22 1840     08/18/22 0600  doxycycline (VIBRAMYCIN) 100 mg in sodium chloride 0.9 % 250 mL IVPB        100 mg 125 mL/hr over 120 Minutes Intravenous 2 times daily 08/18/22 0409     08/17/22 1245  cefTRIAXone (ROCEPHIN) 2 g in sodium chloride 0.9 % 100 mL IVPB        2 g 200 mL/hr over 30 Minutes Intravenous  Once 08/17/22 1238 08/17/22 1507   08/17/22 1245  azithromycin (ZITHROMAX) 500 mg in sodium chloride 0.9 % 250 mL IVPB        500 mg 250 mL/hr over 60 Minutes Intravenous  Once 08/17/22 1238 08/17/22 1813       Subjective: C/o pain related to cough and sob  Objective: Vitals:   08/19/22 0514 08/19/22 0736 08/19/22 1205 08/19/22 1609  BP: 127/60 139/77 128/61 117/71  Pulse: 63 62 68 74  Resp: 20 20 (!) 21 18  Temp: (!) 97.5 F (36.4 C) 97.8 F (36.6 C) 97.9 F (36.6 C) (!) 97.5 F (36.4 C)  TempSrc: Oral Oral Oral Oral  SpO2: 100% 98% 97% 95%  Weight:      Height:        Intake/Output Summary (Last 24 hours) at 08/19/2022 1741 Last data filed at 08/19/2022 1258 Gross per 24 hour  Intake 577.88 ml  Output 900 ml  Net -322.12 ml   Filed Weights   08/17/22 1856 08/18/22 0341  Weight: 77.6 kg 80.4 kg    Examination:  General: No acute distress. Cardiovascular: irregularly irregular Lungs: diffuse bilateral wheezes, frequent cough, tachypneic Abdomen: Soft, nontender, nondistended  Neurological: Alert and oriented 3. Moves all extremities 4 with equal strength. Cranial nerves II through XII grossly intact. Extremities: No clubbing or cyanosis. No edema.  Data Reviewed: I have personally reviewed following labs and imaging studies  CBC: Recent Labs   Lab 08/17/22 1028 08/19/22 0043  WBC 12.5* 11.0*  NEUTROABS 10.6* 7.2  HGB 10.5* 9.7*  HCT 33.0* 29.8*  MCV 90.7 87.9  PLT 263 [id number]    Basic Metabolic Panel: Recent Labs  Lab 08/17/22 1028 08/18/22 0038 08/18/22 0620 08/19/22 0043  NA 136 133*  --  130*  K 4.1 4.7  --  4.0  CL 99 97*  --  97*  CO2 25 22  --  22  GLUCOSE 152* 204*  --  137*  BUN 13 19  --  29*  CREATININE 0.89 1.10*  --  1.52*  CALCIUM 9.4 9.0  --  8.5*  MG  --   --  1.6* 2.0  PHOS  --   --   --  3.6    GFR: Estimated Creatinine Clearance: 28.9 mL/min (Nayelis Bonito) (by C-G formula based on SCr of 1.52 mg/dL (H)).  Liver Function  Tests: Recent Labs  Lab 08/17/22 1028 08/19/22 0043  AST 24 49*  ALT 13 23  ALKPHOS 114 92  BILITOT 0.9 0.7  PROT 6.9 6.0*  ALBUMIN 3.4* 2.8*    CBG: Recent Labs  Lab 08/18/22 1556 08/18/22 2053 08/19/22 0621 08/19/22 1109 08/19/22 1605  GLUCAP 94 171* 147* 119* 142*     Recent Results (from the past 240 hour(s))  Resp Panel by RT-PCR (Flu Kendrea Cerritos&B, Covid) Anterior Nasal Swab     Status: None   Collection Time: 08/17/22 10:09 AM   Specimen: Anterior Nasal Swab  Result Value Ref Range Status   SARS Coronavirus 2 by RT PCR NEGATIVE NEGATIVE Final    Comment: (NOTE) SARS-CoV-2 target nucleic acids are NOT DETECTED.  The SARS-CoV-2 RNA is generally detectable in upper respiratory specimens during the acute phase of infection. The lowest concentration of SARS-CoV-2 viral copies this assay can detect is 138 copies/mL. Avalynne Diver negative result does not preclude SARS-Cov-2 infection and should not be used as the sole basis for treatment or other patient management decisions. Miran Kautzman negative result may occur with  improper specimen collection/handling, submission of specimen other than nasopharyngeal swab, presence of viral mutation(s) within the areas targeted by this assay, and inadequate number of viral copies(<138 copies/mL). Chantae Soo negative result must be combined with clinical  observations, patient history, and epidemiological information. The expected result is Negative.  Fact Sheet for Patients:  EntrepreneurPulse.com.au  Fact Sheet for Healthcare Providers:  IncredibleEmployment.be  This test is no t yet approved or cleared by the [place] FDA and  has been authorized for detection and/or diagnosis of SARS-CoV-2 by FDA under an Emergency Use Authorization (EUA). This EUA will remain  in effect (meaning this test can be used) for the duration of the COVID-19 declaration under Section 564(b)(1) of the Act, 21 U.S.C.section 360bbb-3(b)(1), unless the authorization is terminated  or revoked sooner.       Influenza Donnica Jarnagin by PCR NEGATIVE NEGATIVE Final   Influenza B by PCR NEGATIVE NEGATIVE Final    Comment: (NOTE) The Xpert Xpress SARS-CoV-2/FLU/RSV plus assay is intended as an aid in the diagnosis of influenza from Nasopharyngeal swab specimens and should not be used as Webster Patrone sole basis for treatment. Nasal washings and aspirates are unacceptable for Xpert Xpress SARS-CoV-2/FLU/RSV testing.  Fact Sheet for Patients: EntrepreneurPulse.com.au  Fact Sheet for Healthcare Providers: IncredibleEmployment.be  This test is not yet approved or cleared by the [place] FDA and has been authorized for detection and/or diagnosis of SARS-CoV-2 by FDA under an Emergency Use Authorization (EUA). This EUA will remain in effect (meaning this test can be used) for the duration of the COVID-19 declaration under Section 564(b)(1) of the Act, 21 U.S.C. section 360bbb-3(b)(1), unless the authorization is terminated or revoked.  Performed at [facility], [place] [address] [place], [place] [address]   Culture, blood (routine x 2)     Status: None (Preliminary result)   Collection Time: 08/17/22 12:38 PM   Specimen: BLOOD  Result Value Ref Range Status   Specimen Description BLOOD RIGHT  ANTECUBITAL  Final   Special Requests   Final    BOTTLES DRAWN AEROBIC AND ANAEROBIC Blood Culture adequate volume   Culture   Final    NO GROWTH 2 DAYS Performed at [facility], [place] [address], [place], [place] [address]    Report Status PENDING  Incomplete  Culture, blood (routine x 2)     Status: None (Preliminary result)  Collection Time: 08/17/22 12:43 PM   Specimen: BLOOD  Result Value Ref Range Status   Specimen Description BLOOD BLOOD RIGHT HAND  Final   Special Requests   Final    BOTTLES DRAWN AEROBIC AND ANAEROBIC Blood Culture results may not be optimal due to an inadequate volume of blood received in culture bottles   Culture   Final    NO GROWTH 2 DAYS Performed at [facility] [name], [place] [address], [place], [place] [address]    Report Status PENDING  Incomplete  Expectorated Sputum Assessment w Gram Stain, Rflx to Resp Cult     Status: None (Preliminary result)   Collection Time: 08/18/22 10:27 PM   Specimen: Sputum  Result Value Ref Range Status   Specimen Description SPUTUM  Final   Special Requests NONE  Final   Sputum evaluation   Final    THIS SPECIMEN IS ACCEPTABLE FOR SPUTUM CULTURE Performed at [facility], [place] [address] [place], [place] [address]    Report Status PENDING  Incomplete  Culture, Respiratory w Gram Stain     Status: None (Preliminary result)   Collection Time: 08/18/22 10:27 PM   Specimen: SPU  Result Value Ref Range Status   Specimen Description SPUTUM  Final   Special Requests NONE Reflexed from [id number]  Final   Gram Stain   Final    RARE WBC PRESENT,BOTH PMN AND MONONUCLEAR NO ORGANISMS SEEN    Culture   Final    NO GROWTH < 12 HOURS Performed at [facility], [place] [address] [place], [place] [address]    Report Status PENDING  Incomplete         Radiology Studies: DG CHEST PORT 1 VIEW  Result Date: 08/19/2022 CLINICAL DATA:  Shortness of breath. EXAM: PORTABLE CHEST 1 VIEW COMPARISON:  08/17/2022  FINDINGS: The cardio pericardial silhouette is enlarged. Bibasilar airspace disease again noted without substantial interval change. The visualized bony structures of the thorax are unremarkable. Telemetry leads overlie the chest. IMPRESSION: No substantial interval change. Bibasilar airspace disease. Electronically Signed   By: Misty Stanley M.D.   On: 08/19/2022 16:06   ECHOCARDIOGRAM COMPLETE  Result Date: 08/18/2022    ECHOCARDIOGRAM REPORT   Patient Name:   HERBERTA GUERREIRO Divirgilio Date of Exam: 08/18/2022 Medical Rec #:  [id number]      Height:       65.0 in Accession #:    [id number]     Weight:       177.2 lb Date of Birth:  1936/05/29      BSA:          1.879 m Patient Age:    46 years       BP:           113/69 mmHg Patient Gender: F              HR:           60 bpm. Exam Location:  Inpatient Procedure: 2D Echo, Color Doppler, Cardiac Doppler and Intracardiac            Opacification Agent Indications:    NSTEMI i21.4  History:        Patient has prior history of Echocardiogram examinations, most                 recent 05/06/2022. Arrythmias:Atrial Fibrillation; Risk                 Factors:Hypertension and Dyslipidemia.  Sonographer:    Raquel Sarna Senior RDCS Referring Phys: [id number] [employer]  1. Left ventricular ejection fraction, by estimation, is 25%. The left ventricle has severely decreased function. The left ventricle demonstrates regional wall motion abnormalities (see scoring diagram/findings for description). Left ventricular diastolic parameters are indeterminate.  2. Right ventricular systolic function is mildly reduced. The right ventricular size is normal. There is mildly elevated pulmonary artery systolic pressure.  3. Left atrial size was moderately dilated.  4. Right atrial size was moderately dilated.  5. The mitral valve is abnormal. Mild mitral valve regurgitation. No evidence of mitral stenosis.  6. The tricuspid valve is abnormal. Tricuspid valve regurgitation is mild to moderate.   7. The aortic valve has an indeterminant number of cusps. There is mild calcification of the aortic valve. There is mild thickening of the aortic valve. Aortic valve regurgitation is not visualized. No aortic stenosis is present.  8. The inferior vena cava is dilated in size with <50% respiratory variability, suggesting right atrial pressure of 15 mmHg. FINDINGS  Left Ventricle: Left ventricular ejection fraction, by estimation, is 25%. The left ventricle has severely decreased function. The left ventricle demonstrates regional wall motion abnormalities. The left ventricular internal cavity size was normal in size. There is no left ventricular hypertrophy. Left ventricular diastolic parameters are indeterminate.  LV Wall Scoring: The mid and distal anterior septum and entire apex are akinetic. Right Ventricle: The right ventricular size is normal. Right vetricular wall thickness was not well visualized. Right ventricular systolic function is mildly reduced. There is mildly elevated pulmonary artery systolic pressure. The tricuspid regurgitant velocity is 2.38 m/s, and with an assumed right atrial pressure of 15 mmHg, the estimated right ventricular systolic pressure is 0000000 mmHg. Left Atrium: Left atrial size was moderately dilated. Right Atrium: Right atrial size was moderately dilated. Pericardium: There is no evidence of pericardial effusion. Mitral Valve: The mitral valve is abnormal. There is mild thickening of the mitral valve leaflet(s). There is mild calcification of the mitral valve leaflet(s). Mild mitral annular calcification. Mild mitral valve regurgitation. No evidence of mitral valve stenosis. MV peak gradient, 10.5 mmHg. The mean mitral valve gradient is 2.7 mmHg. Tricuspid Valve: The tricuspid valve is abnormal. Tricuspid valve regurgitation is mild to moderate. No evidence of tricuspid stenosis. Aortic Valve: The aortic valve has an indeterminant number of cusps. There is mild calcification of the  aortic valve. There is mild thickening of the aortic valve. There is mild aortic valve annular calcification. Aortic valve regurgitation is not visualized. No aortic stenosis is present. Aortic valve mean gradient measures 3.1 mmHg. Aortic valve peak gradient measures 6.2 mmHg. Aortic valve area, by VTI measures 2.44 cm. Pulmonic Valve: The pulmonic valve was not well visualized. Pulmonic valve regurgitation is not visualized. No evidence of pulmonic stenosis. Aorta: The aortic root is normal in size and structure. Venous: The inferior vena cava is dilated in size with less than 50% respiratory variability, suggesting right atrial pressure of 15 mmHg. IAS/Shunts: The interatrial septum was not well visualized.  LEFT VENTRICLE PLAX 2D LVIDd:         4.60 cm      Diastology LVIDs:         2.60 cm      LV e' medial:  4.90 cm/s LV PW:         1.00 cm      LV e' lateral: 7.94 cm/s LV IVS:        0.80 cm  LVOT diam:     1.80 cm LV SV:         69 LV SV Index:   37 LVOT Area:     2.54 cm  LV Volumes (MOD) LV vol d, MOD A2C: 115.0 ml LV vol d, MOD A4C: 90.0 ml LV vol s, MOD A2C: 68.4 ml LV vol s, MOD A4C: 52.4 ml LV SV MOD A2C:     46.6 ml LV SV MOD A4C:     90.0 ml LV SV MOD BP:      41.9 ml RIGHT VENTRICLE RV S prime:     7.83 cm/s TAPSE (M-mode): 1.5 cm LEFT ATRIUM             Index        RIGHT ATRIUM           Index LA diam:        5.00 cm 2.66 cm/m   RA Area:     26.30 cm LA Vol (A2C):   85.6 ml 45.55 ml/m  RA Volume:   81.40 ml  43.32 ml/m LA Vol (A4C):   73.8 ml 39.27 ml/m LA Biplane Vol: 80.5 ml 42.84 ml/m  AORTIC VALVE AV Area (Vmax):    2.11 cm AV Area (Vmean):   2.26 cm AV Area (VTI):     2.44 cm AV Vmax:           124.10 cm/s AV Vmean:          84.326 cm/s AV VTI:            0.282 m AV Peak Grad:      6.2 mmHg AV Mean Grad:      3.1 mmHg LVOT Vmax:         103.00 cm/s LVOT Vmean:        74.900 cm/s LVOT VTI:          0.270 m LVOT/AV VTI ratio: 0.96  AORTA Ao Root diam: 3.20 cm Ao Asc diam:  3.20 cm  MITRAL VALVE              TRICUSPID VALVE MV Area (PHT): 1.47 cm   TR Peak grad:   22.7 mmHg MV Area VTI:   1.53 cm   TR Vmax:        238.00 cm/s MV Peak grad:  10.5 mmHg MV Mean grad:  2.7 mmHg   SHUNTS MV Vmax:       1.62 m/s   Systemic VTI:  0.27 m MV Vmean:      69.5 cm/s  Systemic Diam: 1.80 cm Carlyle Dolly MD Electronically signed by Carlyle Dolly MD Signature Date/Time: 08/18/2022/1:46:08 PM    Final    CT CHEST ABDOMEN PELVIS W CONTRAST  Result Date: 08/17/2022 CLINICAL DATA:  abdominal pain, pneumonia EXAM: CT CHEST, ABDOMEN, AND PELVIS WITH CONTRAST TECHNIQUE: Multidetector CT imaging of the chest, abdomen and pelvis was performed following the standard protocol during bolus administration of intravenous contrast. RADIATION DOSE REDUCTION: This exam was performed according to the departmental dose-optimization program which includes automated exposure control, adjustment of the mA and/or kV according to patient size and/or use of iterative reconstruction technique. CONTRAST:  169mL OMNIPAQUE IOHEXOL 350 MG/ML SOLN COMPARISON:  Chest x-ray 03/19/2021 FINDINGS: CT CHEST FINDINGS Cardiovascular: Enlarged heart size. No significant pericardial effusion. The thoracic aorta is normal in caliber. Mild-to-moderate atherosclerotic plaque of the thoracic aorta. Four-vessel coronary artery calcifications. Mitral annular calcification. The main pulmonary artery measures at the upper limits of  normal. No central or segmental pulmonary embolus. Mediastinum/Nodes: Right hilar lymphadenopathy measuring up to 1.3 cm (3:27). Mediastinal lymphadenopathy with as an example Anarosa Kubisiak precarinal 1.4 cm lymph node (3:22). No enlarged mediastinal, hilar, or axillary lymph nodes. Thyroid gland, trachea, and esophagus demonstrate no significant findings. Lungs/Pleura: Interlobular septal wall thickening. Bilateral lower lobe peribronchovascular [name]-glass airspace opacity and consolidation, right greater than left. Right  upper lobe linear atelectasis versus scarring for. Right upper lobe 6 mm pulmonary nodule. Right upper lobe 1.2 x 0.7 cm pulmonary nodule. Scattered right upper lobe peribronchovascular [name]-glass airspace opacities. No pulmonary mass. Trace bilateral pleural effusions. No pneumothorax. Musculoskeletal: No chest wall abnormality. No suspicious lytic or blastic osseous lesions. Chronic T12 anterior wedge compression fracture with at least 20% vertebral body height loss. Multilevel degenerative changes of the spine. CT ABDOMEN PELVIS FINDINGS Hepatobiliary: No focal liver abnormality. Layering hyperdensity within the gallbladder lumen likely calcified gallstones noted within the gallbladder lumen. No gallbladder wall thickening or pericholecystic fluid. No biliary dilatation. Pancreas: No focal lesion. Normal pancreatic contour. No surrounding inflammatory changes. No main pancreatic ductal dilatation. Spleen: Normal in size without focal abnormality. Adrenals/Urinary Tract: No adrenal nodule bilaterally. Bilateral kidneys enhance symmetrically. Right renal cortical scarring. Couple of fluid density lesions within the right kidney likely represent simple renal cysts. Simple renal cysts, in the absence of clinically indicated signs/symptoms, require no independent follow-up. Subcentimeter hypodensities are too small to characterize. No hydronephrosis. No hydroureter. The urinary bladder is unremarkable. Stomach/Bowel: Stomach is within normal limits. No evidence of bowel wall thickening or dilatation. Colonic diverticulosis with no acute diverticulitis. Appendix appears normal. Vascular/Lymphatic: No abdominal aorta or iliac aneurysm. At least moderate atherosclerotic plaque of the aorta and its branches. No abdominal, pelvic, or inguinal lymphadenopathy. Reproductive: Slightly heterogeneous, enhancing, and enlarged left ovary measuring 3.8 x 2.5 cm of unclear etiology. Uterus and bilateral adnexa are unremarkable.  Other: No intraperitoneal free fluid. No intraperitoneal free gas. No organized fluid collection. Vague slight heterogeneity and fat stranding of the right pericolic gutter peritoneum of unclear etiology (3:70 4-78). Musculoskeletal: No abdominal wall hernia or abnormality. No suspicious lytic or blastic osseous lesions. No acute displaced fracture. Multilevel degenerative changes of the spine most prominent at the L3-L4 level. Mild retrolisthesis of L1 on L2 and L2 on L3. IMPRESSION: 1. Cardiomegaly. 2. Multifocal pneumonia most prominent within the right lower lobe. 3. Trace bilateral pleural effusions. 4. Mediastinal and right hilar lymphadenopathy likely reactive in etiology. Recommend attention on follow-up. 5. Tamora Huneke 1.2 cm x 0.7 cm and Endya Austin 0.6 cm right upper lobe pulmonary nodules. Non-contrast chest CT at 3-6 months is recommended. If the nodules are stable at time of repeat CT, then future CT at 18-24 months (from today's scan) is considered optional for low-risk patients, but is recommended for high-risk patients. This recommendation follows the consensus statement: Guidelines for Management of Incidental Pulmonary Nodules Detected on CT Images: From the Fleischner Society 2017; Radiology 2017; 284:228-243. 6. Slightly heterogeneous, hyperdense, and enlarged left ovary measuring 3.8 x 2.5 cm of unclear etiology. Recommend pelvic ultrasound for further evaluation. 7. Vague slight heterogeneity and fat stranding of the right pericolic gutter peritoneum of unclear etiology. Recommend correlation with prior cross-sectional imaging. Attention on follow-up. 8. Cholelithiasis with no CT evidence of acute cholecystitis. 9. Aortic Atherosclerosis (ICD10-I70.0) including four-vessel coronary calcification and mitral annular calcification. Electronically Signed   By: Iven Finn M.D.   On: 08/17/2022 18:52        Scheduled Meds:  amLODipine  5  mg Oral Daily   [START ON 08/20/2022] aspirin EC  81 mg Oral Daily    benzonatate  100 mg Oral BID   diclofenac Sodium  2 g Topical QID   docusate sodium  100 mg Oral Daily   ferrous sulfate  325 mg Oral Q breakfast   gabapentin  100 mg Oral TID   guaiFENesin  1,200 mg Oral BID   influenza vaccine adjuvanted  0.5 mL Intramuscular Tomorrow-1000   insulin aspart  0-15 Units Subcutaneous TID WC   ipratropium-albuterol  3 mL Nebulization Q6H   methylPREDNISolone (SOLU-MEDROL) injection  40 mg Intravenous Daily   rosuvastatin  40 mg Oral Daily   sodium chloride flush  3 mL Intravenous Q12H   Continuous Infusions:  sodium chloride     sodium chloride 10 mL/hr at 08/19/22 0516   cefTRIAXone (ROCEPHIN)  IV Stopped (08/19/22 0941)   doxycycline (VIBRAMYCIN) IV Stopped (08/19/22 1250)   heparin 900 Units/hr (08/19/22 1051)     LOS: 2 days    Time spent: over 30 min    Fayrene Helper, MD [facility]   To contact the attending provider between 7A-7P or the covering provider during after hours 7P-7A, please log into the web site [email or web address] and access using universal [place] password for that web site. If you do not have the password, please call the hospital operator.  08/19/2022, 5:41 PM

## 2022-08-19 NOTE — Telephone Encounter (Signed)
I just received this message today. Pt has been hospitalized with pneumonia and NSTEMI.

## 2022-08-19 NOTE — Progress Notes (Signed)
ANTICOAGULATION CONSULT NOTE\  Pharmacy Consult for Heparin Indication: chest pain/ACS  No Known Allergies  Patient Measurements: Height: 5\' 5"  (165.1 cm) Weight: 80.4 kg (177 lb 4 oz) IBW/kg (Calculated) : 57 Heparin Dosing Weight: 73 kg  Vital Signs: Temp: 97.8 F (36.6 C) (10/23 0736) Temp Source: Oral (10/23 0736) BP: 139/77 (10/23 0736) Pulse Rate: 62 (10/23 0736)  Labs: Recent Labs    08/17/22 1028 08/17/22 1440 08/18/22 0038 08/18/22 0038 08/18/22 0620 08/18/22 1452 08/19/22 0043  HGB 10.5*  --   --   --   --   --  9.7*  HCT 33.0*  --   --   --   --   --  29.8*  PLT 263  --   --   --   --   --  247  APTT  --   --  54*   < > 100* 88* 68*  HEPARINUNFRC  --   --  >1.10*  --  >1.10*  --  >1.10*  CREATININE 0.89  --  1.10*  --   --   --  1.52*  TROPONINIHS 158* 2,067* 5,091*  --  4,331*  --   --    < > = values in this interval not displayed.     Estimated Creatinine Clearance: 28.9 mL/min (A) (by C-G formula based on SCr of 1.52 mg/dL (H)).   Medical History: Past Medical History:  Diagnosis Date   Hyperlipidemia    Hypertension    Mitral regurgitation    mild to moderate by echo 04/2022   Mitral stenosis    mild by echo 04/2022   Osteoarthritis    Overweight(278.02)    Permanent atrial fibrillation (HCC)     Medications:  Medications Prior to Admission  Medication Sig Dispense Refill Last Dose   amlodipine-olmesartan (AZOR) 10-20 MG tablet TAKE 1 TABLET BY MOUTH EVERY DAY (Patient taking differently: Take 1 tablet by mouth daily.) 90 tablet 1 08/08/2022   Cholecalciferol (VITAMIN D3 PO) Take 1 tablet by mouth daily.   08/16/2022   cyclobenzaprine (FLEXERIL) 10 MG tablet Take 0.5 tablets (5 mg total) by mouth 2 (two) times daily as needed for muscle spasms. 20 tablet 0 08/16/2022   diclofenac Sodium (VOLTAREN) 1 % GEL Apply 2 grams to knees 4 times a day as needed. (Patient taking differently: Apply 2 g topically daily as needed (For pain).) 400 g 1  08/16/2022   docusate sodium (COLACE) 100 MG capsule Take 100 mg by mouth daily.    08/16/2022   ferrous sulfate 325 (65 FE) MG tablet TAKE 1 TABLET BY MOUTH EVERY DAY WITH BREAKFAST (Patient taking differently: Take 325 mg by mouth daily with breakfast.) 100 tablet 1 08/16/2022   furosemide (LASIX) 20 MG tablet 1/2 TAB BY MOUTH TWICE AS NEEDED FOR SWELLING IN THE LEGS. (Patient taking differently: Take 10 mg by mouth daily as needed for fluid.) 30 tablet 0 08/16/2022   gabapentin (NEURONTIN) 100 MG capsule TAKE 1 CAPSULE BY MOUTH THREE TIMES A DAY (Patient taking differently: Take 100 mg by mouth 3 (three) times daily.) 270 capsule 0 08/16/2022   HYDROcodone-acetaminophen (NORCO/VICODIN) 5-325 MG tablet Take 1 tablet by mouth every 6 (six) hours as needed for moderate pain.   08/16/2022   metFORMIN (GLUCOPHAGE) 500 MG tablet TAKE 1 TABLET BY MOUTH EVERY DAY WITH BREAKFAST (Patient taking differently: Take 500 mg by mouth daily with breakfast.) 90 tablet 1 08/16/2022   Omega-3 Fatty Acids (FISH OIL CONCENTRATE PO) Take  1 capsule by mouth daily.    08/16/2022   rosuvastatin (CRESTOR) 10 MG tablet TAKE 1 TABLET BY MOUTH EVERY DAY (Patient taking differently: Take 10 mg by mouth daily.) 90 tablet 3 08/16/2022   VITAMIN D PO Take 1 tablet by mouth daily.   08/16/2022   White Petrolatum-Mineral Oil (SYSTANE NIGHTTIME) OINT Place 1 drop into the left eye 4 (four) times daily.   08/16/2022   apixaban (ELIQUIS) 5 MG TABS tablet Take 1 tablet (5 mg total) by mouth 2 (two) times daily. 60 tablet 11 08/16/2022 at 0800   Misc. Devices Careers adviser. Devices MISC Rollator walker 1 Device 0    Misc. [facility] bed 1 Device 0    XARELTO 20 MG TABS tablet TAKE 1 TABLET EVERY DAY WITH SUPPER..DUE FOR OFFICE VISIT (Patient not taking: Reported on 08/18/2022) 90 tablet 1 Not Taking    Scheduled:   amLODipine  5 mg Oral Daily   [START ON 08/20/2022] aspirin EC  81 mg Oral  Daily   benzonatate  100 mg Oral BID   diclofenac Sodium  2 g Topical QID   docusate sodium  100 mg Oral Daily   ferrous sulfate  325 mg Oral Q breakfast   gabapentin  100 mg Oral TID   guaiFENesin  1,200 mg Oral BID   influenza vaccine adjuvanted  0.5 mL Intramuscular Tomorrow-1000   insulin aspart  0-15 Units Subcutaneous TID WC   rosuvastatin  40 mg Oral Daily   sodium chloride flush  3 mL Intravenous Q12H   Infusions:   sodium chloride     sodium chloride 10 mL/hr at 08/19/22 0516   cefTRIAXone (ROCEPHIN)  IV 2 g (08/19/22 0841)   doxycycline (VIBRAMYCIN) IV 100 mg (08/18/22 2114)   heparin 850 Units/hr (08/18/22 2118)   PRN: sodium chloride, acetaminophen, cyclobenzaprine, HYDROcodone-acetaminophen, ipratropium-albuterol, LORazepam, nitroGLYCERIN, sodium chloride flush  Assessment: 75 yof with a history of HLD, HTN, mitral regurg, mitral stenosis, AF on apixaban. Patient is presenting with cough and CP. Heparin per pharmacy consult placed for chest pain/ACS. Patient is on apixaban prior to arrival for h/o Afib.  Today 08/19/22 the aPTT is 68 -within goal range but has decreased to lowest end of therapeutic goal.  Heparin level >1.10 falsely elevated due to recent DOAC  use. Hgb 9.7 and pltc 247. No overt bleeding noted.   Goal of Therapy:  Heparin level 0.3-0.7 units/ml aPTT 66-102 seconds Monitor platelets by anticoagulation protocol: Yes   Plan:   Increase IV heparin to 900 units/hr. Check aPTT & anti-Xa level, CBC daily while on heparin Continue to monitor via aPTT until levels are correlated Monitor for s/sx bleeding   Nicole Cella, RPh Clinical Pharmacist [phone]  08/19/2022 9:17 AM   [facility] pharmacy phone numbers are listed on [email or web address]

## 2022-08-19 NOTE — Evaluation (Signed)
Physical Therapy Evaluation Patient Details Name: Anna [name] MRN: [id number] DOB: 07-21-1936 Today's Date: 08/19/2022  History of Present Illness  86 y.o. female presents to [facility] hospital on 08/17/2022 with new onset cough, SOB, nausea and chest pain. Chest x-ray suspicious for PNA. Troponins uptrending with concern for NSTEMI. PMH includes HLD, HTN, mitral regurgitation, OA, PAF.  Clinical Impression  PTA pt living with granddaughter in single level house with level entry. Up until 2 weeks ago pt reports independence with ambulation in her home and use of RW or w/c in community as needed. Evaluation limited by onset of constant coughing and related exertional pain with movement. Pt noted to be very weak in bed on assessment, requiring min-mod AAROM of bilateral hip and knees. Given good family support hopeful that pt will be able to discharge home with HHPT. PT will work to progress mobility in next session for better Evaluation of situation.       Recommendations for follow up therapy are one component of a multi-disciplinary discharge planning process, led by the attending physician.  Recommendations may be updated based on patient status, additional functional criteria and insurance authorization.  Follow Up Recommendations Home health PT      Assistance Recommended at Discharge Frequent or constant Supervision/Assistance  Patient can return home with the following  A little help with walking and/or transfers;A little help with bathing/dressing/bathroom;Assistance with cooking/housework;Assist for transportation;Help with stairs or ramp for entrance    Equipment Recommendations None recommended by PT  Recommendations for Other Services  OT consult    Functional Status Assessment Patient has had a recent decline in their functional status and demonstrates the ability to make significant improvements in function in a reasonable and predictable amount of time.     Precautions /  Restrictions Precautions Precautions: Fall Restrictions Weight Bearing Restrictions: No      Mobility  Bed Mobility               General bed mobility comments: deferred due to increased coughing and related pain with movement.                      Pertinent Vitals/Pain Pain Assessment Pain Assessment: 0-10 Pain Score: 8  Pain Location: chest, ribs, back from coughing Pain Descriptors / Indicators: Sharp, Shooting, Sore Pain Intervention(s): Limited activity within patient's tolerance, Monitored during session, Repositioned    Home Living Family/patient expects to be discharged to:: Private residence Living Arrangements: Other relatives (granddaughter) Available Help at Discharge: Family;Available 24 hours/day Type of Home: House Home Access: Level entry       Home Layout: One level Home Equipment: Conservation officer, nature (2 wheels);BSC/3in1;Wheelchair - manual;Grab bars - tub/shower;Hand held shower head;Grab bars - toilet      Prior Function Prior Level of Function : Independent/Modified Independent             Mobility Comments: until the last 2 weeks walking with RW, use of w/c with longer distance community mobility ADLs Comments: independent in bathing and dressing, granddaughter has been doing cooking and cleaning last 2 weeks        Extremity/Trunk Assessment   Upper Extremity Assessment Upper Extremity Assessment: Generalized weakness;Defer to OT evaluation    Lower Extremity Assessment Lower Extremity Assessment: RLE deficits/detail;LLE deficits/detail RLE Deficits / Details: hip and ankle AAROM WFL, knee flex/ext AAROM limited by longstanding pain, strength grossly 2+/5 RLE Coordination: decreased fine motor LLE Deficits / Details: hip and ankle AAROM WFL, knee flex/ext AAROM  limited by longstanding pain, strength grossly 2+/5 LLE Coordination: decreased fine motor       Communication   Communication: No difficulties  Cognition  Arousal/Alertness: Awake/alert Behavior During Therapy: Flat affect Overall Cognitive Status: Within Functional Limits for tasks assessed                                 General Comments: pt with difficulty conversing due to coughing fits        General Comments General comments (skin integrity, edema, etc.): Pt on 2L O2 via [place], which is new, VSS        Assessment/Plan    PT Assessment Patient needs continued PT services  PT Problem List Decreased strength;Decreased range of motion;Decreased activity tolerance;Decreased balance;Decreased mobility;Decreased coordination;Cardiopulmonary status limiting activity;Pain       PT Treatment Interventions DME instruction;Gait training;Functional mobility training;Therapeutic activities;Therapeutic exercise;Balance training;Cognitive remediation;Patient/family education    PT Goals (Current goals can be found in the Care Plan section)  Acute Rehab PT Goals Patient Stated Goal: stop coughing PT Goal Formulation: With patient Time For Goal Achievement: 09/02/22 Potential to Achieve Goals: Fair    Frequency Min 3X/week        AM-PAC PT "6 Clicks" Mobility  Outcome Measure Help needed turning from your back to your side while in a flat bed without using bedrails?: A Little Help needed moving from lying on your back to sitting on the side of a flat bed without using bedrails?: Total Help needed moving to and from a bed to a chair (including a wheelchair)?: Total Help needed standing up from a chair using your arms (e.g., wheelchair or bedside chair)?: Total Help needed to walk in hospital room?: Total Help needed climbing 3-5 steps with a railing? : Total 6 Click Score: 8    End of Session   Activity Tolerance: Treatment limited secondary to medical complications (Comment) (coughing and related pain) Patient left: in bed;with call bell/phone within reach;with bed alarm set Nurse Communication: Mobility status PT  Visit Diagnosis: Muscle weakness (generalized) (M62.81);Pain;Difficulty in walking, not elsewhere classified (R26.2) Pain - part of body:  (chest back ribs)    Time: 9833-8250 PT Time Calculation (min) (ACUTE ONLY): 14 min   Charges:   PT Evaluation $PT Eval Moderate Complexity: 1 Mod          Kolson Chovanec B. Migdalia Dk PT, DPT Acute Rehabilitation Services Please use secure chat or  Call Office [phone]   [place] 08/19/2022, 2:10 PM

## 2022-08-19 NOTE — Progress Notes (Signed)
PT Cancellation Note  Patient Details Name: Anna [name] MRN: [id number] DOB: 11-Sep-1936   Cancelled Treatment:    Reason Eval/Treat Not Completed: (P) Patient at procedure or test/unavailable Pt is off floor for heart cath. PT will follow back for Evaluation this afternoon.  [name] B. Migdalia Dk PT, DPT Acute Rehabilitation Services Please use secure chat or  Call Office [phone]    [place] 08/19/2022, 9:08 AM

## 2022-08-19 NOTE — Progress Notes (Signed)
Rounding Note    Patient Name: Anna [name] Date of Encounter: 08/19/2022  [place] Cardiologist: Fransico Him, MD   Subjective   Patient complains of persistent coughing. Cough often produces a white-green sputum. Has some chest pain when coughing, located in the middle of her chest. Denies chest pain when she is not coughing. Denies orthopnea.   Inpatient Medications    Scheduled Meds:  amLODipine  5 mg Oral Daily   [START ON 08/20/2022] aspirin EC  81 mg Oral Daily   benzonatate  100 mg Oral BID   diclofenac Sodium  2 g Topical QID   docusate sodium  100 mg Oral Daily   ferrous sulfate  325 mg Oral Q breakfast   gabapentin  100 mg Oral TID   guaiFENesin  1,200 mg Oral BID   influenza vaccine adjuvanted  0.5 mL Intramuscular Tomorrow-1000   insulin aspart  0-15 Units Subcutaneous TID WC   rosuvastatin  40 mg Oral Daily   sodium chloride flush  3 mL Intravenous Q12H   Continuous Infusions:  sodium chloride     sodium chloride 10 mL/hr at 08/19/22 0516   cefTRIAXone (ROCEPHIN)  IV 2 g (08/19/22 0841)   doxycycline (VIBRAMYCIN) IV 100 mg (08/18/22 2114)   heparin 850 Units/hr (08/18/22 2118)   PRN Meds: sodium chloride, acetaminophen, cyclobenzaprine, HYDROcodone-acetaminophen, ipratropium-albuterol, LORazepam, nitroGLYCERIN, sodium chloride flush   Vital Signs    Vitals:   08/19/22 0054 08/19/22 0055 08/19/22 0514 08/19/22 0736  BP: 114/72  127/60 139/77  Pulse: 65  63 62  Resp: 18  20 20   Temp: 98 F (36.7 C)  (!) 97.5 F (36.4 C) 97.8 F (36.6 C)  TempSrc: Oral  Oral Oral  SpO2: 97% 98% 100% 98%  Weight:      Height:        Intake/Output Summary (Last 24 hours) at 08/19/2022 0942 Last data filed at 08/19/2022 0700 Gross per 24 hour  Intake 855.16 ml  Output 500 ml  Net 355.16 ml      08/18/2022    3:41 AM 08/17/2022    6:56 PM 03/28/2022    2:24 PM  Last 3 Weights  Weight (lbs) 177 lb 4 oz 171 lb 182 lb  Weight (kg) 80.4 kg  77.565 kg 82.555 kg      Telemetry    Atypical atrial flutter, HR in the 50s-60s - Personally Reviewed  ECG    Atypical atrial flutter. HR 64 BPM, TWV in leads V3-V6. Subtle ST elevations in leads V2-V6 - Personally Reviewed  Physical Exam   GEN: No acute distress. Laying in the bed with head elevated. Coughs frequently  Neck: No JVD Cardiac: Irregular rate and rhythm. No murmurs, rubs, gallops. Radial pulses 2+ bilaterally  Respiratory: Crackles and expiratory wheezes throughout  GI: Soft, nontender, non-distended  MS: No edema; No deformity. Neuro:  Nonfocal  Psych: Normal affect   Labs    High Sensitivity Troponin:   Recent Labs  Lab 08/17/22 1028 08/17/22 1440 08/18/22 0038 08/18/22 0620  TROPONINIHS 158* 2,067* 5,091* 4,331*     Chemistry Recent Labs  Lab 08/17/22 1028 08/18/22 0038 08/18/22 0620 08/19/22 0043  NA 136 133*  --  130*  K 4.1 4.7  --  4.0  CL 99 97*  --  97*  CO2 25 22  --  22  GLUCOSE 152* 204*  --  137*  BUN 13 19  --  29*  CREATININE 0.89 1.10*  --  1.52*  CALCIUM 9.4 9.0  --  8.5*  MG  --   --  1.6* 2.0  PROT 6.9  --   --  6.0*  ALBUMIN 3.4*  --   --  2.8*  AST 24  --   --  49*  ALT 13  --   --  23  ALKPHOS 114  --   --  92  BILITOT 0.9  --   --  0.7  GFRNONAA >60 50*  --  34*  ANIONGAP 12 14  --  11    Lipids  Recent Labs  Lab 08/18/22 0620  CHOL 69  TRIG 62  HDL 33*  LDLCALC 24  CHOLHDL 2.1    Hematology Recent Labs  Lab 08/17/22 1028 08/19/22 0043  WBC 12.5* 11.0*  RBC 3.64* 3.39*  HGB 10.5* 9.7*  HCT 33.0* 29.8*  MCV 90.7 87.9  MCH 28.8 28.6  MCHC 31.8 32.6  RDW 13.2 13.6  PLT 263 247   Thyroid No results for input(s): "TSH", "FREET4" in the last 168 hours.  BNP Recent Labs  Lab 08/17/22 1440  BNP 697.2*    DDimer No results for input(s): "DDIMER" in the last 168 hours.   Radiology    ECHOCARDIOGRAM COMPLETE  Result Date: 08/18/2022    ECHOCARDIOGRAM REPORT   Patient Name:   YULIA BOSSARD Date  of Exam: 08/18/2022 Medical Rec #:  [id number]      Height:       65.0 in Accession #:    [id number]     Weight:       177.2 lb Date of Birth:  July 24, 1936      BSA:          1.879 m Patient Age:    86 years       BP:           113/69 mmHg Patient Gender: F              HR:           60 bpm. Exam Location:  Inpatient Procedure: 2D Echo, Color Doppler, Cardiac Doppler and Intracardiac            Opacification Agent Indications:    NSTEMI i21.4  History:        Patient has prior history of Echocardiogram examinations, most                 recent 05/06/2022. Arrythmias:Atrial Fibrillation; Risk                 Factors:Hypertension and Dyslipidemia.  Sonographer:    Raquel Sarna Senior RDCS Referring Phys: [id number] [place]  1. Left ventricular ejection fraction, by estimation, is 25%. The left ventricle has severely decreased function. The left ventricle demonstrates regional wall motion abnormalities (see scoring diagram/findings for description). Left ventricular diastolic parameters are indeterminate.  2. Right ventricular systolic function is mildly reduced. The right ventricular size is normal. There is mildly elevated pulmonary artery systolic pressure.  3. Left atrial size was moderately dilated.  4. Right atrial size was moderately dilated.  5. The mitral valve is abnormal. Mild mitral valve regurgitation. No evidence of mitral stenosis.  6. The tricuspid valve is abnormal. Tricuspid valve regurgitation is mild to moderate.  7. The aortic valve has an indeterminant number of cusps. There is mild calcification of the aortic valve. There is mild thickening of the aortic valve. Aortic valve regurgitation is not visualized. No aortic stenosis is  present.  8. The inferior vena cava is dilated in size with <50% respiratory variability, suggesting right atrial pressure of 15 mmHg. FINDINGS  Left Ventricle: Left ventricular ejection fraction, by estimation, is 25%. The left ventricle has severely decreased  function. The left ventricle demonstrates regional wall motion abnormalities. The left ventricular internal cavity size was normal in size. There is no left ventricular hypertrophy. Left ventricular diastolic parameters are indeterminate.  LV Wall Scoring: The mid and distal anterior septum and entire apex are akinetic. Right Ventricle: The right ventricular size is normal. Right vetricular wall thickness was not well visualized. Right ventricular systolic function is mildly reduced. There is mildly elevated pulmonary artery systolic pressure. The tricuspid regurgitant velocity is 2.38 m/s, and with an assumed right atrial pressure of 15 mmHg, the estimated right ventricular systolic pressure is 0000000 mmHg. Left Atrium: Left atrial size was moderately dilated. Right Atrium: Right atrial size was moderately dilated. Pericardium: There is no evidence of pericardial effusion. Mitral Valve: The mitral valve is abnormal. There is mild thickening of the mitral valve leaflet(s). There is mild calcification of the mitral valve leaflet(s). Mild mitral annular calcification. Mild mitral valve regurgitation. No evidence of mitral valve stenosis. MV peak gradient, 10.5 mmHg. The mean mitral valve gradient is 2.7 mmHg. Tricuspid Valve: The tricuspid valve is abnormal. Tricuspid valve regurgitation is mild to moderate. No evidence of tricuspid stenosis. Aortic Valve: The aortic valve has an indeterminant number of cusps. There is mild calcification of the aortic valve. There is mild thickening of the aortic valve. There is mild aortic valve annular calcification. Aortic valve regurgitation is not visualized. No aortic stenosis is present. Aortic valve mean gradient measures 3.1 mmHg. Aortic valve peak gradient measures 6.2 mmHg. Aortic valve area, by VTI measures 2.44 cm. Pulmonic Valve: The pulmonic valve was not well visualized. Pulmonic valve regurgitation is not visualized. No evidence of pulmonic stenosis. Aorta: The aortic  root is normal in size and structure. Venous: The inferior vena cava is dilated in size with less than 50% respiratory variability, suggesting right atrial pressure of 15 mmHg. IAS/Shunts: The interatrial septum was not well visualized.  LEFT VENTRICLE PLAX 2D LVIDd:         4.60 cm      Diastology LVIDs:         2.60 cm      LV e' medial:  4.90 cm/s LV PW:         1.00 cm      LV e' lateral: 7.94 cm/s LV IVS:        0.80 cm LVOT diam:     1.80 cm LV SV:         69 LV SV Index:   37 LVOT Area:     2.54 cm  LV Volumes (MOD) LV vol d, MOD A2C: 115.0 ml LV vol d, MOD A4C: 90.0 ml LV vol s, MOD A2C: 68.4 ml LV vol s, MOD A4C: 52.4 ml LV SV MOD A2C:     46.6 ml LV SV MOD A4C:     90.0 ml LV SV MOD BP:      41.9 ml RIGHT VENTRICLE RV S prime:     7.83 cm/s TAPSE (M-mode): 1.5 cm LEFT ATRIUM             Index        RIGHT ATRIUM           Index LA diam:        5.00  cm 2.66 cm/m   RA Area:     26.30 cm LA Vol (A2C):   85.6 ml 45.55 ml/m  RA Volume:   81.40 ml  43.32 ml/m LA Vol (A4C):   73.8 ml 39.27 ml/m LA Biplane Vol: 80.5 ml 42.84 ml/m  AORTIC VALVE AV Area (Vmax):    2.11 cm AV Area (Vmean):   2.26 cm AV Area (VTI):     2.44 cm AV Vmax:           124.10 cm/s AV Vmean:          84.326 cm/s AV VTI:            0.282 m AV Peak Grad:      6.2 mmHg AV Mean Grad:      3.1 mmHg LVOT Vmax:         103.00 cm/s LVOT Vmean:        74.900 cm/s LVOT VTI:          0.270 m LVOT/AV VTI ratio: 0.96  AORTA Ao Root diam: 3.20 cm Ao Asc diam:  3.20 cm MITRAL VALVE              TRICUSPID VALVE MV Area (PHT): 1.47 cm   TR Peak grad:   22.7 mmHg MV Area VTI:   1.53 cm   TR Vmax:        238.00 cm/s MV Peak grad:  10.5 mmHg MV Mean grad:  2.7 mmHg   SHUNTS MV Vmax:       1.62 m/s   Systemic VTI:  0.27 m MV Vmean:      69.5 cm/s  Systemic Diam: 1.80 cm Carlyle Dolly MD Electronically signed by Carlyle Dolly MD Signature Date/Time: 08/18/2022/1:46:08 PM    Final    CT CHEST ABDOMEN PELVIS W CONTRAST  Result Date:  08/17/2022 CLINICAL DATA:  abdominal pain, pneumonia EXAM: CT CHEST, ABDOMEN, AND PELVIS WITH CONTRAST TECHNIQUE: Multidetector CT imaging of the chest, abdomen and pelvis was performed following the standard protocol during bolus administration of intravenous contrast. RADIATION DOSE REDUCTION: This exam was performed according to the departmental dose-optimization program which includes automated exposure control, adjustment of the mA and/or kV according to patient size and/or use of iterative reconstruction technique. CONTRAST:  131mL OMNIPAQUE IOHEXOL 350 MG/ML SOLN COMPARISON:  Chest x-ray 03/19/2021 FINDINGS: CT CHEST FINDINGS Cardiovascular: Enlarged heart size. No significant pericardial effusion. The thoracic aorta is normal in caliber. Mild-to-moderate atherosclerotic plaque of the thoracic aorta. Four-vessel coronary artery calcifications. Mitral annular calcification. The main pulmonary artery measures at the upper limits of normal. No central or segmental pulmonary embolus. Mediastinum/Nodes: Right hilar lymphadenopathy measuring up to 1.3 cm (3:27). Mediastinal lymphadenopathy with as an example a precarinal 1.4 cm lymph node (3:22). No enlarged mediastinal, hilar, or axillary lymph nodes. Thyroid gland, trachea, and esophagus demonstrate no significant findings. Lungs/Pleura: Interlobular septal wall thickening. Bilateral lower lobe peribronchovascular ground-glass airspace opacity and consolidation, right greater than left. Right upper lobe linear atelectasis versus scarring for. Right upper lobe 6 mm pulmonary nodule. Right upper lobe 1.2 x 0.7 cm pulmonary nodule. Scattered right upper lobe peribronchovascular ground-glass airspace opacities. No pulmonary mass. Trace bilateral pleural effusions. No pneumothorax. Musculoskeletal: No chest wall abnormality. No suspicious lytic or blastic osseous lesions. Chronic T12 anterior wedge compression fracture with at least 20% vertebral body height loss.  Multilevel degenerative changes of the spine. CT ABDOMEN PELVIS FINDINGS Hepatobiliary: No focal liver abnormality. Layering hyperdensity within the gallbladder lumen likely calcified  gallstones noted within the gallbladder lumen. No gallbladder wall thickening or pericholecystic fluid. No biliary dilatation. Pancreas: No focal lesion. Normal pancreatic contour. No surrounding inflammatory changes. No main pancreatic ductal dilatation. Spleen: Normal in size without focal abnormality. Adrenals/Urinary Tract: No adrenal nodule bilaterally. Bilateral kidneys enhance symmetrically. Right renal cortical scarring. Couple of fluid density lesions within the right kidney likely represent simple renal cysts. Simple renal cysts, in the absence of clinically indicated signs/symptoms, require no independent follow-up. Subcentimeter hypodensities are too small to characterize. No hydronephrosis. No hydroureter. The urinary bladder is unremarkable. Stomach/Bowel: Stomach is within normal limits. No evidence of bowel wall thickening or dilatation. Colonic diverticulosis with no acute diverticulitis. Appendix appears normal. Vascular/Lymphatic: No abdominal aorta or iliac aneurysm. At least moderate atherosclerotic plaque of the aorta and its branches. No abdominal, pelvic, or inguinal lymphadenopathy. Reproductive: Slightly heterogeneous, enhancing, and enlarged left ovary measuring 3.8 x 2.5 cm of unclear etiology. Uterus and bilateral adnexa are unremarkable. Other: No intraperitoneal free fluid. No intraperitoneal free gas. No organized fluid collection. Vague slight heterogeneity and fat stranding of the right pericolic gutter peritoneum of unclear etiology (3:70 4-78). Musculoskeletal: No abdominal wall hernia or abnormality. No suspicious lytic or blastic osseous lesions. No acute displaced fracture. Multilevel degenerative changes of the spine most prominent at the L3-L4 level. Mild retrolisthesis of L1 on L2 and L2 on L3.  IMPRESSION: 1. Cardiomegaly. 2. Multifocal pneumonia most prominent within the right lower lobe. 3. Trace bilateral pleural effusions. 4. Mediastinal and right hilar lymphadenopathy likely reactive in etiology. Recommend attention on follow-up. 5. A 1.2 cm x 0.7 cm and a 0.6 cm right upper lobe pulmonary nodules. Non-contrast chest CT at 3-6 months is recommended. If the nodules are stable at time of repeat CT, then future CT at 18-24 months (from today's scan) is considered optional for low-risk patients, but is recommended for high-risk patients. This recommendation follows the consensus statement: Guidelines for Management of Incidental Pulmonary Nodules Detected on CT Images: From the Fleischner Society 2017; Radiology 2017; 284:228-243. 6. Slightly heterogeneous, hyperdense, and enlarged left ovary measuring 3.8 x 2.5 cm of unclear etiology. Recommend pelvic ultrasound for further evaluation. 7. Vague slight heterogeneity and fat stranding of the right pericolic gutter peritoneum of unclear etiology. Recommend correlation with prior cross-sectional imaging. Attention on follow-up. 8. Cholelithiasis with no CT evidence of acute cholecystitis. 9. Aortic Atherosclerosis (ICD10-I70.0) including four-vessel coronary calcification and mitral annular calcification. Electronically Signed   By: Iven Finn M.D.   On: 08/17/2022 18:52   DG Chest 2 View  Result Date: 08/17/2022 CLINICAL DATA:  Pneumonia EXAM: CHEST - 2 VIEW COMPARISON:  CXR 07/19/21 FINDINGS: No pleural effusion. No pneumothorax. Enlarged cardiac contours. Normal mediastinal contours. No displaced rib fractures. Visualized upper abdomen is unremarkable. There is a hazy opacity at the right lung base superimposed on a background of prominent interstitial opacities. Vertebral body heights are maintained. IMPRESSION: Hazy opacity at the right lung base superimposed on a background of prominent interstitial opacities. Findings could represent  pneumonia on a background of mild pulmonary edema. In the appropriate clinical setting. Electronically Signed   By: Marin Roberts M.D.   On: 08/17/2022 11:22    Cardiac Studies   Echocardiogram 08/18/22  1. Left ventricular ejection fraction, by estimation, is 25%. The left  ventricle has severely decreased function. The left ventricle demonstrates  regional wall motion abnormalities (see scoring diagram/findings for  description). Left ventricular  diastolic parameters are indeterminate.   2. Right ventricular systolic  function is mildly reduced. The right  ventricular size is normal. There is mildly elevated pulmonary artery  systolic pressure.   3. Left atrial size was moderately dilated.   4. Right atrial size was moderately dilated.   5. The mitral valve is abnormal. Mild mitral valve regurgitation. No  evidence of mitral stenosis.   6. The tricuspid valve is abnormal. Tricuspid valve regurgitation is mild  to moderate.   7. The aortic valve has an indeterminant number of cusps. There is mild  calcification of the aortic valve. There is mild thickening of the aortic  valve. Aortic valve regurgitation is not visualized. No aortic stenosis is  present.   8. The inferior vena cava is dilated in size with <50% respiratory  variability, suggesting right atrial pressure of 15 mmHg.   Patient Profile     86 y.o. female with history of atrial flutter on Eliquis, hypertension, hyperlipidemia presented with chest pain, shortness of breath, nausea.  Assessment & Plan    NSTEMI  - Patient presented complaining of chest pain. hsTn 158>>2067>>5091>>4331. EKG with subtle ST elevations in V2-5 with new TWI from prior EKG.   - Patient now on IV heparin  - Last dose of Eliquis was 10/21 AM - Scheduled for cath today. Will discuss with MD if it would be beneficial to postpone procedure until pneumonia improved. Patient had multiple coughing fits during my examination that made it difficult for  her to catch her breath. However, may be wise to do cath today given EKG changes, reduced EF.  - Continue ASA, statin. Not on BB due to history of bradycardia  - Note-- creatinine did bump from 1.10>>1.52 this AM. Holding medications that could further worsen renal function surrounding cath. BMP ordered for tomorrow AM to assess renal function post cath   Acute systolic heart failure  Ischemic cardiomyopathy  - Echo from 04/2022 with EF 55-60% without regional wall motion abnormalities. Echo this admission with EF 25%, regional wall motion abnormalities, mildly reduced RV systolic function  - Planned to undergo L/R heart catheterization today  - CXR with possible NPA vs edema. BNP elevated to 677 - Patient was on IV lasix, creatinine bumped to 1.52 (up from 1.10) - Lasix held to protect renal function prior to cath.   - Will need to start/titrate GDMT after cath as BP and renal function allow.  As above, creatinine up to 1.52 after a few days of IV lasix. Home irbesartan held. Not on BB due to history of bradycardia   Chronic atrial flutter  - Rate well controlled, not on AV nodal blocking medications  - On IV heparin for now. Home eliquis held prior to cath   Multifocal PNA  - On antibiotics per primary       For questions or updates, please contact [place] Please consult [email or web address] for contact info under        Signed, Margie Billet, PA-C  08/19/2022, 9:42 AM

## 2022-08-19 NOTE — Progress Notes (Signed)
   Heart Failure Stewardship Pharmacist Progress Note   PCP: Pcp, No PCP-Cardiologist: Fransico Him, MD    HPI:  86 yo F with PMH of aflutter, HTN, and HLD.  Presented to the ED on 10/21 with chest pain, cough, and shortness of breath. CXR with suspicions for PNA vs mild pulmonary edema. Chest CT with cardiomegaly. Troponin elevated and peaked to 5091. Admitted for NSTEMI. An ECHO was done on 10/22 and showed LVEF 25%, regional wall motion abnormalities, RV mildly reduced, mild MR, and mild-moderate TR. R/LHC planned for 10/24. Prior ECHO in July 2023 with LVEF 55-60%  Current HF Medications: None  Prior to admission HF Medications: Diuretic: furosemide 10 mg daily PRN ACE/ARB/ARNI: olmesartan 20 mg daily  Pertinent Lab Values: Serum creatinine 1.10>1.52, BUN 29, Potassium 4.0, Sodium 130, BNP 697.2, Magnesium 2.0, A1c 5.8   Vital Signs: Weight: 177 lbs (admission weight: 177 lbs) Blood pressure: 130/60s  Heart rate: 60-80s  I/O: -952mL today; net even  Medication Assistance / Insurance Benefits Check: Does the patient have prescription insurance?  Yes Type of insurance plan: [place] Medicaid  Outpatient Pharmacy:  Prior to admission outpatient pharmacy: [facility] Is the patient willing to use [facility] at discharge? Yes Is the patient willing to transition their outpatient pharmacy to utilize a [facility] outpatient pharmacy?   Pending    Assessment: 1. Acute systolic CHF (LVEF 32%), pending R/LHC. NYHA class II symptoms. - Off IV lasix with AKI. Strict I/Os and daily weights. Keep K>4 and Mag>2. - Not on BB due to history of bradycardia - Further GDMT pending cath findings and renal function trends   Plan: 1) Medication changes recommended at this time: - None pending cath tomorrow  2) Patient assistance: - Has [place] Medicaid  3)  Education  - Patient has been educated on potential additions to HF medication regimen - Patient verbalizes understanding that over the  next few months, these medication doses may change and more medications may be added to optimize HF regimen - Patient has been educated on basic disease state pathophysiology and goals of therapy   Kerby Nora, PharmD, BCPS Heart Failure Stewardship Pharmacist Phone [phone]

## 2022-08-20 ENCOUNTER — Encounter (HOSPITAL_COMMUNITY): Payer: Self-pay | Admitting: Internal Medicine

## 2022-08-20 ENCOUNTER — Inpatient Hospital Stay (HOSPITAL_COMMUNITY): Payer: Medicare Other

## 2022-08-20 ENCOUNTER — Encounter (HOSPITAL_COMMUNITY)
Admission: EM | Disposition: A | Discharge status: Home / Self Care | Payer: Self-pay | Source: Home / Self Care | Attending: Family Medicine

## 2022-08-20 DIAGNOSIS — I420 Dilated cardiomyopathy: Secondary | ICD-10-CM

## 2022-08-20 DIAGNOSIS — Z515 Encounter for palliative care: Secondary | ICD-10-CM | POA: Diagnosis not present

## 2022-08-20 DIAGNOSIS — Z7189 Other specified counseling: Secondary | ICD-10-CM

## 2022-08-20 DIAGNOSIS — I484 Atypical atrial flutter: Secondary | ICD-10-CM

## 2022-08-20 DIAGNOSIS — I214 Non-ST elevation (NSTEMI) myocardial infarction: Secondary | ICD-10-CM | POA: Diagnosis not present

## 2022-08-20 DIAGNOSIS — I5021 Acute systolic (congestive) heart failure: Secondary | ICD-10-CM | POA: Diagnosis not present

## 2022-08-20 DIAGNOSIS — R7989 Other specified abnormal findings of blood chemistry: Secondary | ICD-10-CM | POA: Diagnosis not present

## 2022-08-20 HISTORY — PX: LEFT HEART CATH AND CORONARY ANGIOGRAPHY: CATH118249

## 2022-08-20 LAB — LIPOPROTEIN A (LPA): Lipoprotein (a): 20.6 nmol/L (ref <75.0)

## 2022-08-20 LAB — HEPARIN LEVEL (UNFRACTIONATED): Heparin Unfractionated: 1.03 IU/mL — ABNORMAL HIGH (ref 0.30–0.70)

## 2022-08-20 LAB — BASIC METABOLIC PANEL
Anion gap: 12 (ref 5–15)
BUN: 32 mg/dL — ABNORMAL HIGH (ref 8–23)
CO2: 19 mmol/L — ABNORMAL LOW (ref 22–32)
Calcium: 8.5 mg/dL — ABNORMAL LOW (ref 8.9–10.3)
Chloride: 97 mmol/L — ABNORMAL LOW (ref 98–111)
Creatinine, Ser: 1.57 mg/dL — ABNORMAL HIGH (ref 0.44–1.00)
GFR, Estimated: 32 mL/min — ABNORMAL LOW (ref >60)
Glucose, Bld: 198 mg/dL — ABNORMAL HIGH (ref 70–99)
Potassium: 4.2 mmol/L (ref 3.5–5.1)
Sodium: 128 mmol/L — ABNORMAL LOW (ref 135–145)

## 2022-08-20 LAB — GLUCOSE, CAPILLARY
Glucose-Capillary: 212 mg/dL — ABNORMAL HIGH (ref 70–99)
Glucose-Capillary: 146 mg/dL — ABNORMAL HIGH (ref 70–99)
Glucose-Capillary: 148 mg/dL — ABNORMAL HIGH (ref 70–99)
Glucose-Capillary: 195 mg/dL — ABNORMAL HIGH (ref 70–99)

## 2022-08-20 LAB — URINALYSIS, ROUTINE W REFLEX MICROSCOPIC
Bilirubin Urine: NEGATIVE
Glucose, UA: NEGATIVE mg/dL
Hgb urine dipstick: NEGATIVE
Ketones, ur: NEGATIVE mg/dL
Leukocytes,Ua: NEGATIVE
Nitrite: NEGATIVE
Protein, ur: NEGATIVE mg/dL
Specific Gravity, Urine: 1.029 (ref 1.005–1.030)
pH: 5 (ref 5.0–8.0)

## 2022-08-20 LAB — EXPECTORATED SPUTUM ASSESSMENT W GRAM STAIN, RFLX TO RESP C

## 2022-08-20 LAB — CBC
HCT: 28.8 % — ABNORMAL LOW (ref 36.0–46.0)
Hemoglobin: 9.2 g/dL — ABNORMAL LOW (ref 12.0–15.0)
MCH: 28.5 pg (ref 26.0–34.0)
MCHC: 31.9 g/dL (ref 30.0–36.0)
MCV: 89.2 fL (ref 80.0–100.0)
Platelets: 227 10*3/uL (ref 150–400)
RBC: 3.23 MIL/uL — ABNORMAL LOW (ref 3.87–5.11)
RDW: 13.6 % (ref 11.5–15.5)
WBC: 15.1 10*3/uL — ABNORMAL HIGH (ref 4.0–10.5)
nRBC: 0 % (ref 0.0–0.2)

## 2022-08-20 LAB — APTT: aPTT: 83 seconds — ABNORMAL HIGH (ref 24–36)

## 2022-08-20 SURGERY — LEFT HEART CATH AND CORONARY ANGIOGRAPHY
Anesthesia: LOCAL

## 2022-08-20 MED ORDER — FUROSEMIDE 20 MG PO TABS
20.0000 mg | ORAL_TABLET | Freq: Every day | ORAL | Status: DC
  Administered 2022-08-21: 20 mg via ORAL
  Filled 2022-08-20: qty 1

## 2022-08-20 MED ORDER — HYDRALAZINE HCL 20 MG/ML IJ SOLN: 10.0000 mg | INTRAMUSCULAR | Status: AC | PRN

## 2022-08-20 MED ORDER — SODIUM CHLORIDE 0.9 % IV SOLN: INTRAVENOUS | Status: AC

## 2022-08-20 MED ORDER — LIDOCAINE HCL (PF) 1 % IJ SOLN
INTRAMUSCULAR | Status: AC
  Filled 2022-08-20: qty 30

## 2022-08-20 MED ORDER — SACUBITRIL-VALSARTAN 24-26 MG PO TABS
1.0000 | ORAL_TABLET | Freq: Two times a day (BID) | ORAL | Status: DC
  Administered 2022-08-21: 1 via ORAL
  Filled 2022-08-20: qty 1

## 2022-08-20 MED ORDER — MIDAZOLAM HCL 2 MG/2ML IJ SOLN
INTRAMUSCULAR | Status: AC
  Filled 2022-08-20: qty 2

## 2022-08-20 MED ORDER — VERAPAMIL HCL 2.5 MG/ML IV SOLN
INTRAVENOUS | Status: AC
  Filled 2022-08-20: qty 2

## 2022-08-20 MED ORDER — VERAPAMIL HCL 2.5 MG/ML IV SOLN
INTRAVENOUS | Status: DC | PRN
  Administered 2022-08-20: 10 mL via INTRA_ARTERIAL

## 2022-08-20 MED ORDER — IOHEXOL 350 MG/ML SOLN
INTRAVENOUS | Status: DC | PRN
  Administered 2022-08-20: 15 mL

## 2022-08-20 MED ORDER — HEPARIN SODIUM (PORCINE) 1000 UNIT/ML IJ SOLN
INTRAMUSCULAR | Status: DC | PRN
  Administered 2022-08-20: 4000 [IU] via INTRAVENOUS

## 2022-08-20 MED ORDER — HEPARIN SODIUM (PORCINE) 1000 UNIT/ML IJ SOLN
INTRAMUSCULAR | Status: AC
  Filled 2022-08-20: qty 10

## 2022-08-20 MED ORDER — LIDOCAINE HCL (PF) 1 % IJ SOLN
INTRAMUSCULAR | Status: DC | PRN
  Administered 2022-08-20: 2 mL

## 2022-08-20 MED ORDER — SODIUM CHLORIDE 0.9 % IV SOLN
250.0000 mL | INTRAVENOUS | Status: DC | PRN
  Administered 2022-08-21 – 2022-08-22 (×2): 250 mL via INTRAVENOUS

## 2022-08-20 MED ORDER — ACETAMINOPHEN 325 MG PO TABS: 650.0000 mg | ORAL_TABLET | ORAL | Status: DC | PRN

## 2022-08-20 MED ORDER — SODIUM CHLORIDE 0.9% FLUSH
3.0000 mL | Freq: Two times a day (BID) | INTRAVENOUS | Status: DC
  Administered 2022-08-20 – 2022-08-22 (×4): 3 mL via INTRAVENOUS

## 2022-08-20 MED ORDER — ONDANSETRON HCL 4 MG/2ML IJ SOLN: 4.0000 mg | Freq: Four times a day (QID) | INTRAMUSCULAR | Status: DC | PRN

## 2022-08-20 MED ORDER — HEPARIN (PORCINE) IN NACL 1000-0.9 UT/500ML-% IV SOLN
INTRAVENOUS | Status: AC
  Filled 2022-08-20: qty 1000

## 2022-08-20 MED ORDER — SODIUM CHLORIDE 0.9% FLUSH: 3.0000 mL | INTRAVENOUS | Status: DC | PRN

## 2022-08-20 MED ORDER — HEPARIN (PORCINE) 25000 UT/250ML-% IV SOLN
1000.0000 [IU]/h | INTRAVENOUS | Status: DC
  Administered 2022-08-20: 900 [IU]/h via INTRAVENOUS
  Filled 2022-08-20: qty 250

## 2022-08-20 MED ORDER — MIDAZOLAM HCL 2 MG/2ML IJ SOLN
INTRAMUSCULAR | Status: DC | PRN
  Administered 2022-08-20 (×2): 1 mg via INTRAVENOUS

## 2022-08-20 MED ORDER — FENTANYL CITRATE (PF) 100 MCG/2ML IJ SOLN
INTRAMUSCULAR | Status: DC | PRN
  Administered 2022-08-20 (×2): 25 ug via INTRAVENOUS

## 2022-08-20 MED ORDER — LABETALOL HCL 5 MG/ML IV SOLN: 10.0000 mg | INTRAVENOUS | Status: AC | PRN

## 2022-08-20 MED ORDER — HEPARIN (PORCINE) IN NACL 1000-0.9 UT/500ML-% IV SOLN
INTRAVENOUS | Status: DC | PRN
  Administered 2022-08-20 (×2): 500 mL

## 2022-08-20 MED ORDER — FENTANYL CITRATE (PF) 100 MCG/2ML IJ SOLN
INTRAMUSCULAR | Status: AC
  Filled 2022-08-20: qty 2

## 2022-08-20 SURGICAL SUPPLY — 13 items
BAND ZEPHYR COMPRESS 30 LONG (HEMOSTASIS) IMPLANT
CATH 5FR JL3.5 JR4 ANG PIG MP (CATHETERS) IMPLANT
ELECT DEFIB PAD ADLT CADENCE (PAD) IMPLANT
GLIDESHEATH SLEND SS 6F .021 (SHEATH) IMPLANT
GUIDEWIRE INQWIRE 1.5J.035X260 (WIRE) IMPLANT
INQWIRE 1.5J .035X260CM (WIRE) ×1
KIT HEART LEFT (KITS) ×1 IMPLANT
MAT PREVALON FULL STRYKER (MISCELLANEOUS) IMPLANT
PACK CARDIAC CATHETERIZATION (CUSTOM PROCEDURE TRAY) ×1 IMPLANT
SHEATH GLIDE SLENDER 4/5FR (SHEATH) IMPLANT
SHEATH PROBE COVER 6X72 (BAG) IMPLANT
TRANSDUCER W/STOPCOCK (MISCELLANEOUS) ×1 IMPLANT
TUBING CIL FLEX 10 FLL-RA (TUBING) ×1 IMPLANT

## 2022-08-20 NOTE — TOC Progression Note (Addendum)
Transition of Care [facility]) - Progression Note    Patient Details  Name: Anna [name] MRN: [id number] Date of Birth: Oct 13, 1936  Transition of [facility] CM/SW Contact  Zenon Mayo, RN Phone Number: 08/20/2022, 6:49 PM  Clinical Narrative:    From home, SOB , chest pain, chronic aflutter, on eliquis, 2 liters oxygen, conts on hep drip, for cath today, pt eval rec HHPT.  She is establihed at the [facility] clinic, will make hospital follow up.  TOC following.        Expected Discharge Plan and Services                                                 Social Determinants of Health (SDOH) Interventions Housing Interventions: Intervention Not Indicated  Readmission Risk Interventions     No data to display

## 2022-08-20 NOTE — Evaluation (Signed)
Occupational Therapy Evaluation Patient Details Name: Anna [name] MRN: [id number] DOB: 1936/07/28 Today's Date: 08/20/2022   History of Present Illness 86 y.o. female presents to [facility] hospital on 08/17/2022 with new onset cough, SOB, nausea and chest pain. Chest x-ray suspicious for PNA. Troponins uptrending with concern for NSTEMI. PMH includes HLD, HTN, mitral regurgitation, OA, PAF.   Clinical Impression   Pt reports use of RW and w/c at baseline, ind with ADLs, and family assists with IADLs. Pt performing ADLs with min guard-max A, min-mod A +2 for bed mobility, and min-mod A +2 for transfers. Pt able to stand x4 for pericare and complete UB/LB bathing at EOB. Pt lethargic and with increased coughing initially, however more alert and communicative once seated EOB/as session progressed. Pt presenting with impairments listed below, will follow acutely. Recommend HHOT at d/c.     Recommendations for follow up therapy are one component of a multi-disciplinary discharge planning process, led by the attending physician.  Recommendations may be updated based on patient status, additional functional criteria and insurance authorization.   Follow Up Recommendations  Home health OT    Assistance Recommended at Discharge Frequent or constant Supervision/Assistance  Patient can return home with the following A lot of help with walking and/or transfers;A little help with bathing/dressing/bathroom;Assistance with cooking/housework;Direct supervision/assist for medications management;Direct supervision/assist for financial management;Assist for transportation;Help with stairs or ramp for entrance    Functional Status Assessment  Patient has had a recent decline in their functional status and demonstrates the ability to make significant improvements in function in a reasonable and predictable amount of time.  Equipment Recommendations  Tub/shower seat    Recommendations for Other Services PT  consult     Precautions / Restrictions Precautions Precautions: Fall Restrictions Weight Bearing Restrictions: No      Mobility Bed Mobility Overal bed mobility: Needs Assistance Bed Mobility: Supine to Sit, Sit to Supine     Supine to sit: Mod assist, +2 for physical assistance Sit to supine: Min assist   General bed mobility comments: increased assist needed due to lethargy    Transfers Overall transfer level: Needs assistance Equipment used: Rolling walker (2 wheels) Transfers: Sit to/from Stand Sit to Stand: Mod assist, Min assist, +2 physical assistance           General transfer comment: mod +2 fading to min, pt able to stand x4 from bed level and take steps toward [facility]      Balance Overall balance assessment: Needs assistance Sitting-balance support: Feet supported, Bilateral upper extremity supported Sitting balance-Leahy Scale: Fair Sitting balance - Comments: can reach minimally outside BOS without LOB   Standing balance support: During functional activity, Reliant on assistive device for balance Standing balance-Leahy Scale: Poor Standing balance comment: reliant on external support                           ADL either performed or assessed with clinical judgement   ADL Overall ADL's : Needs assistance/impaired Eating/Feeding: NPO   Grooming: Min guard;Sitting Grooming Details (indicate cue type and reason): brushing hair Upper Body Bathing: Minimal assistance Upper Body Bathing Details (indicate cue type and reason): to wash back and UB Lower Body Bathing: Moderate assistance;Sitting/lateral leans Lower Body Bathing Details (indicate cue type and reason): to wash below knee level Upper Body Dressing : Minimal assistance;Sitting   Lower Body Dressing: Maximal assistance;Bed level Lower Body Dressing Details (indicate cue type and reason): to don socks EOB  Toilet Transfer: Minimal assistance;+2 for physical assistance   Toileting-  Clothing Manipulation and Hygiene: Moderate assistance;Sit to/from stand       Functional mobility during ADLs: Minimal assistance;Moderate assistance;+2 for physical assistance;Rolling walker (2 wheels)       Vision   Vision Assessment?: No apparent visual deficits     Perception     Praxis      Pertinent Vitals/Pain Pain Assessment Pain Assessment: No/denies pain     Hand Dominance     Extremity/Trunk Assessment Upper Extremity Assessment Upper Extremity Assessment: Generalized weakness   Lower Extremity Assessment Lower Extremity Assessment: Defer to PT evaluation       Communication Communication Communication: No difficulties   Cognition Arousal/Alertness: Awake/alert Behavior During Therapy: WFL for tasks assessed/performed Overall Cognitive Status: Within Functional Limits for tasks assessed                                 General Comments: initially lethargic, more alert when sitting EOB     General Comments  VSS on supplemental O2    Exercises     Shoulder Instructions      Home Living Family/patient expects to be discharged to:: Private residence Living Arrangements: Other relatives (granddaughter) Available Help at Discharge: Family;Available 24 hours/day Type of Home: House Home Access: Level entry     Home Layout: One level     Bathroom Shower/Tub: Occupational psychologist: Standard Bathroom Accessibility: Yes   Home Equipment: Conservation officer, nature (2 wheels);BSC/3in1;Wheelchair - manual;Grab bars - tub/shower;Hand held shower head;Grab bars - toilet          Prior Functioning/Environment Prior Level of Function : Independent/Modified Independent             Mobility Comments: until the last 2 weeks walking with RW, use of w/c with longer distance community mobility ADLs Comments: independent in bathing and dressing, granddaughter has been doing cooking and cleaning last 2 weeks        OT Problem List:  Decreased strength;Decreased range of motion;Decreased activity tolerance;Impaired balance (sitting and/or standing);Cardiopulmonary status limiting activity      OT Treatment/Interventions: Self-care/ADL training;Therapeutic exercise;Energy conservation;DME and/or AE instruction;Therapeutic activities;Patient/family education;Balance training    OT Goals(Current goals can be found in the care plan section) Acute Rehab OT Goals Patient Stated Goal: to get washed up OT Goal Formulation: With patient Time For Goal Achievement: 08/28/22 Potential to Achieve Goals: Good ADL Goals Pt Will Perform Upper Body Dressing: with min guard assist;sitting Pt Will Perform Lower Body Dressing: with min guard assist;sitting/lateral leans;sit to/from stand Pt Will Transfer to Toilet: with min guard assist;ambulating;regular height toilet Additional ADL Goal #1: pt will perform bed mobility with min A in prep for ADLs  OT Frequency: Min 2X/week    Co-evaluation              AM-PAC OT "6 Clicks" Daily Activity     Outcome Measure Help from another person eating meals?: None (has ROM/ability but is NPO) Help from another person taking care of personal grooming?: A Little Help from another person toileting, which includes using toliet, bedpan, or urinal?: A Little Help from another person bathing (including washing, rinsing, drying)?: A Lot Help from another person to put on and taking off regular upper body clothing?: A Little Help from another person to put on and taking off regular lower body clothing?: A Lot 6 Click Score: 17   End  of Session Equipment Utilized During Treatment: Gait belt;Rolling walker (2 wheels);Oxygen Nurse Communication: Mobility status  Activity Tolerance: Patient tolerated treatment well Patient left: in bed;with call bell/phone within reach;with bed alarm set;with nursing/sitter in room  OT Visit Diagnosis: Unsteadiness on feet (R26.81);Muscle weakness (generalized)  (M62.81);Other abnormalities of gait and mobility (R26.89)                Time: 9628-3662 OT Time Calculation (min): 37 min Charges:  OT General Charges $OT Visit: 1 Visit OT Evaluation $OT Eval Moderate Complexity: 1 Mod OT Treatments $Self Care/Home Management : 8-22 mins  Lynnda Child, OTD, OTR/L Acute Rehab [phone]) [phone] - [place] 08/20/2022, 12:45 PM

## 2022-08-20 NOTE — H&P (View-Only) (Signed)
Rounding Note    Patient Name: Anna [name] Date of Encounter: 08/20/2022  [place] Cardiologist: Fransico Him, MD   Subjective   Denies any chest pain.    Inpatient Medications    Scheduled Meds:  amLODipine  5 mg Oral Daily   arformoterol  15 mcg Nebulization BID   aspirin EC  81 mg Oral Daily   benzonatate  100 mg Oral BID   budesonide (PULMICORT) nebulizer solution  0.25 mg Nebulization BID   diclofenac Sodium  2 g Topical QID   docusate sodium  100 mg Oral Daily   ferrous sulfate  325 mg Oral Q breakfast   gabapentin  100 mg Oral TID   guaiFENesin  1,200 mg Oral BID   influenza vaccine adjuvanted  0.5 mL Intramuscular Tomorrow-1000   insulin aspart  0-15 Units Subcutaneous TID WC   ipratropium-albuterol  3 mL Nebulization Q6H   methylPREDNISolone (SOLU-MEDROL) injection  40 mg Intravenous Daily   rosuvastatin  40 mg Oral Daily   sodium chloride flush  3 mL Intravenous Q12H   Continuous Infusions:  sodium chloride     sodium chloride 10 mL/hr at 08/20/22 0757   cefTRIAXone (ROCEPHIN)  IV 2 g (08/20/22 0834)   doxycycline (VIBRAMYCIN) IV 100 mg (08/19/22 2113)   heparin 900 Units/hr (08/19/22 2117)   PRN Meds: sodium chloride, acetaminophen, albuterol, cyclobenzaprine, HYDROcodone-acetaminophen, LORazepam, morphine injection, nitroGLYCERIN, sodium chloride flush   Vital Signs    Vitals:   08/20/22 0224 08/20/22 0437 08/20/22 0718 08/20/22 0819  BP:  127/64 (!) 133/57   Pulse: 60  68   Resp: 15 16 18    Temp:  97.6 F (36.4 C) 97.8 F (36.6 C)   TempSrc:  Axillary Oral   SpO2: 98% 100% 99% 100%  Weight:  76.9 kg    Height:        Intake/Output Summary (Last 24 hours) at 08/20/2022 0920 Last data filed at 08/20/2022 0700 Gross per 24 hour  Intake 412.96 ml  Output 400 ml  Net 12.96 ml       08/20/2022    4:37 AM 08/18/2022    3:41 AM 08/17/2022    6:56 PM  Last 3 Weights  Weight (lbs) 169 lb 8.5 oz 177 lb 4 oz 171 lb  Weight  (kg) 76.9 kg 80.4 kg 77.565 kg      Telemetry    Atypical atrial flutter with CVR- Personally Reviewed  ECG    No new EKG to review - Personally Reviewed  Physical Exam   GEN: Well nourished, well developed in no acute distress HEENT: Normal NECK: No JVD; No carotid bruits LYMPHATICS: No lymphadenopathy CARDIAC:RRR, no murmurs, rubs, gallops RESPIRATORY:  Clear to auscultation without rales, wheezing or rhonchi  ABDOMEN: Soft, non-tender, non-distended MUSCULOSKELETAL:  No edema; No deformity  SKIN: Warm and dry NEUROLOGIC:  Alert and oriented x 3 PSYCHIATRIC:  Normal affect  Labs    High Sensitivity Troponin:   Recent Labs  Lab 08/17/22 1028 08/17/22 1440 08/18/22 0038 08/18/22 0620  TROPONINIHS 158* 2,067* 5,091* 4,331*      Chemistry Recent Labs  Lab 08/17/22 1028 08/18/22 0038 08/18/22 0620 08/19/22 0043 08/20/22 0132  NA 136 133*  --  130* 128*  K 4.1 4.7  --  4.0 4.2  CL 99 97*  --  97* 97*  CO2 25 22  --  22 19*  GLUCOSE 152* 204*  --  137* 198*  BUN 13 19  --  29* 32*  CREATININE 0.89 1.10*  --  1.52* 1.57*  CALCIUM 9.4 9.0  --  8.5* 8.5*  MG  --   --  1.6* 2.0  --   PROT 6.9  --   --  6.0*  --   ALBUMIN 3.4*  --   --  2.8*  --   AST 24  --   --  49*  --   ALT 13  --   --  23  --   ALKPHOS 114  --   --  92  --   BILITOT 0.9  --   --  0.7  --   GFRNONAA >60 50*  --  34* 32*  ANIONGAP 12 14  --  11 12     Lipids  Recent Labs  Lab 08/18/22 0620  CHOL 69  TRIG 62  HDL 33*  LDLCALC 24  CHOLHDL 2.1     Hematology Recent Labs  Lab 08/17/22 1028 08/19/22 0043 08/20/22 0132  WBC 12.5* 11.0* 15.1*  RBC 3.64* 3.39* 3.23*  HGB 10.5* 9.7* 9.2*  HCT 33.0* 29.8* 28.8*  MCV 90.7 87.9 89.2  MCH 28.8 28.6 28.5  MCHC 31.8 32.6 31.9  RDW 13.2 13.6 13.6  PLT 263 247 227    Thyroid No results for input(s): "TSH", "FREET4" in the last 168 hours.  BNP Recent Labs  Lab 08/17/22 1440  BNP 697.2*     DDimer No results for input(s):  "DDIMER" in the last 168 hours.   Radiology    DG CHEST PORT 1 VIEW  Result Date: 08/19/2022 CLINICAL DATA:  Shortness of breath. EXAM: PORTABLE CHEST 1 VIEW COMPARISON:  08/17/2022 FINDINGS: The cardio pericardial silhouette is enlarged. Bibasilar airspace disease again noted without substantial interval change. The visualized bony structures of the thorax are unremarkable. Telemetry leads overlie the chest. IMPRESSION: No substantial interval change. Bibasilar airspace disease. Electronically Signed   By: Misty Stanley M.D.   On: 08/19/2022 16:06   ECHOCARDIOGRAM COMPLETE  Result Date: 08/18/2022    ECHOCARDIOGRAM REPORT   Patient Name:   Anna [name] Calk Date of Exam: 08/18/2022 Medical Rec #:  [id number]      Height:       65.0 in Accession #:    [id number]     Weight:       177.2 lb Date of Birth:  [birth year]      BSA:          1.879 m Patient Age:    86 years       BP:           113/69 mmHg Patient Gender: F              HR:           60 bpm. Exam Location:  Inpatient Procedure: 2D Echo, Color Doppler, Cardiac Doppler and Intracardiac            Opacification Agent Indications:    NSTEMI i21.4  History:        Patient has prior history of Echocardiogram examinations, most                 recent 05/06/2022. Arrythmias:Atrial Fibrillation; Risk                 Factors:Hypertension and Dyslipidemia.  Sonographer:    Raquel Sarna Senior RDCS Referring Phys: [id number] [place]  1. Left ventricular ejection fraction, by estimation, is 25%. The left ventricle has severely decreased  function. The left ventricle demonstrates regional wall motion abnormalities (see scoring diagram/findings for description). Left ventricular diastolic parameters are indeterminate.  2. Right ventricular systolic function is mildly reduced. The right ventricular size is normal. There is mildly elevated pulmonary artery systolic pressure.  3. Left atrial size was moderately dilated.  4. Right atrial size was moderately  dilated.  5. The mitral valve is abnormal. Mild mitral valve regurgitation. No evidence of mitral stenosis.  6. The tricuspid valve is abnormal. Tricuspid valve regurgitation is mild to moderate.  7. The aortic valve has an indeterminant number of cusps. There is mild calcification of the aortic valve. There is mild thickening of the aortic valve. Aortic valve regurgitation is not visualized. No aortic stenosis is present.  8. The inferior vena cava is dilated in size with <50% respiratory variability, suggesting right atrial pressure of 15 mmHg. FINDINGS  Left Ventricle: Left ventricular ejection fraction, by estimation, is 25%. The left ventricle has severely decreased function. The left ventricle demonstrates regional wall motion abnormalities. The left ventricular internal cavity size was normal in size. There is no left ventricular hypertrophy. Left ventricular diastolic parameters are indeterminate.  LV Wall Scoring: The mid and distal anterior septum and entire apex are akinetic. Right Ventricle: The right ventricular size is normal. Right vetricular wall thickness was not well visualized. Right ventricular systolic function is mildly reduced. There is mildly elevated pulmonary artery systolic pressure. The tricuspid regurgitant velocity is 2.38 m/s, and with an assumed right atrial pressure of 15 mmHg, the estimated right ventricular systolic pressure is 0000000 mmHg. Left Atrium: Left atrial size was moderately dilated. Right Atrium: Right atrial size was moderately dilated. Pericardium: There is no evidence of pericardial effusion. Mitral Valve: The mitral valve is abnormal. There is mild thickening of the mitral valve leaflet(s). There is mild calcification of the mitral valve leaflet(s). Mild mitral annular calcification. Mild mitral valve regurgitation. No evidence of mitral valve stenosis. MV peak gradient, 10.5 mmHg. The mean mitral valve gradient is 2.7 mmHg. Tricuspid Valve: The tricuspid valve is  abnormal. Tricuspid valve regurgitation is mild to moderate. No evidence of tricuspid stenosis. Aortic Valve: The aortic valve has an indeterminant number of cusps. There is mild calcification of the aortic valve. There is mild thickening of the aortic valve. There is mild aortic valve annular calcification. Aortic valve regurgitation is not visualized. No aortic stenosis is present. Aortic valve mean gradient measures 3.1 mmHg. Aortic valve peak gradient measures 6.2 mmHg. Aortic valve area, by VTI measures 2.44 cm. Pulmonic Valve: The pulmonic valve was not well visualized. Pulmonic valve regurgitation is not visualized. No evidence of pulmonic stenosis. Aorta: The aortic root is normal in size and structure. Venous: The inferior vena cava is dilated in size with less than 50% respiratory variability, suggesting right atrial pressure of 15 mmHg. IAS/Shunts: The interatrial septum was not well visualized.  LEFT VENTRICLE PLAX 2D LVIDd:         4.60 cm      Diastology LVIDs:         2.60 cm      LV e' medial:  4.90 cm/s LV PW:         1.00 cm      LV e' lateral: 7.94 cm/s LV IVS:        0.80 cm LVOT diam:     1.80 cm LV SV:         69 LV SV Index:   37 LVOT Area:  2.54 cm  LV Volumes (MOD) LV vol d, MOD A2C: 115.0 ml LV vol d, MOD A4C: 90.0 ml LV vol s, MOD A2C: 68.4 ml LV vol s, MOD A4C: 52.4 ml LV SV MOD A2C:     46.6 ml LV SV MOD A4C:     90.0 ml LV SV MOD BP:      41.9 ml RIGHT VENTRICLE RV S prime:     7.83 cm/s TAPSE (M-mode): 1.5 cm LEFT ATRIUM             Index        RIGHT ATRIUM           Index LA diam:        5.00 cm 2.66 cm/m   RA Area:     26.30 cm LA Vol (A2C):   85.6 ml 45.55 ml/m  RA Volume:   81.40 ml  43.32 ml/m LA Vol (A4C):   73.8 ml 39.27 ml/m LA Biplane Vol: 80.5 ml 42.84 ml/m  AORTIC VALVE AV Area (Vmax):    2.11 cm AV Area (Vmean):   2.26 cm AV Area (VTI):     2.44 cm AV Vmax:           124.10 cm/s AV Vmean:          84.326 cm/s AV VTI:            0.282 m AV Peak Grad:      6.2  mmHg AV Mean Grad:      3.1 mmHg LVOT Vmax:         103.00 cm/s LVOT Vmean:        74.900 cm/s LVOT VTI:          0.270 m LVOT/AV VTI ratio: 0.96  AORTA Ao Root diam: 3.20 cm Ao Asc diam:  3.20 cm MITRAL VALVE              TRICUSPID VALVE MV Area (PHT): 1.47 cm   TR Peak grad:   22.7 mmHg MV Area VTI:   1.53 cm   TR Vmax:        238.00 cm/s MV Peak grad:  10.5 mmHg MV Mean grad:  2.7 mmHg   SHUNTS MV Vmax:       1.62 m/s   Systemic VTI:  0.27 m MV Vmean:      69.5 cm/s  Systemic Diam: 1.80 cm Carlyle Dolly MD Electronically signed by Carlyle Dolly MD Signature Date/Time: 08/18/2022/1:46:08 PM    Final     Cardiac Studies   Echocardiogram 08/18/22  1. Left ventricular ejection fraction, by estimation, is 25%. The left  ventricle has severely decreased function. The left ventricle demonstrates  regional wall motion abnormalities (see scoring diagram/findings for  description). Left ventricular  diastolic parameters are indeterminate.   2. Right ventricular systolic function is mildly reduced. The right  ventricular size is normal. There is mildly elevated pulmonary artery  systolic pressure.   3. Left atrial size was moderately dilated.   4. Right atrial size was moderately dilated.   5. The mitral valve is abnormal. Mild mitral valve regurgitation. No  evidence of mitral stenosis.   6. The tricuspid valve is abnormal. Tricuspid valve regurgitation is mild  to moderate.   7. The aortic valve has an indeterminant number of cusps. There is mild  calcification of the aortic valve. There is mild thickening of the aortic  valve. Aortic valve regurgitation is not visualized. No aortic stenosis is  present.  8. The inferior vena cava is dilated in size with <50% respiratory  variability, suggesting right atrial pressure of 15 mmHg.   Patient Profile     86 y.o. female with history of atrial flutter on Eliquis, hypertension, hyperlipidemia presented with chest pain, shortness of breath,  nausea.  Assessment & Plan    NSTEMI  - Patient presented complaining of chest pain. hsTn 158>>2067>>5091>>4331. EKG with subtle ST elevations in V2-5 with new TWI from prior EKG.   - Patient now on IV heparin  - Last dose of Eliquis was 10/21 AM - she is NPO for cath this am - continue ASA, IV Heparin gtt, statin - no BB due to wheezing and hx of bradycardia - plan for cath today ( no LVgram due to bump in SCr) to define coronary anatomy  Acute systolic heart failure  Ischemic cardiomyopathy  - Echo from 04/2022 with EF 55-60% without regional wall motion abnormalities. Echo this admission with EF 25%, regional wall motion abnormalities, mildly reduced RV systolic function  - Planned to undergo L/R heart catheterization today  - CXR with possible NPA vs edema. BNP elevated to 677 - Patient was on IV lasix, creatinine bumped to 1.52 (up from 1.10) - Lasix held to protect renal function prior to cath.   - Will need to start/titrate GDMT after cath as BP and renal function allow.  As above, creatinine up to 1.52 after a few days of IV lasix. Home irbesartan held. Not on BB due to history of bradycardia   Chronic atrial flutter  - HR adequately controlled and not requiring any AVN blocking agents - continue IV Heparin gtt and restart DOAC post cath  Multifocal PNA  - On antibiotics per primary     I have spent a total of 35 minutes with patient reviewing 2D echo , telemetry, EKGs, labs and examining patient as well as establishing an assessment and plan that was discussed with the patient.  > 50% of time was spent in direct patient care.    For questions or updates, please contact [place] Please consult [email or web address] for contact info under        Signed, Fransico Him, MD  08/20/2022, 9:20 AM

## 2022-08-20 NOTE — Progress Notes (Signed)
Cardiac cath from today reviewed findings as below  Conclusion    LV end diastolic pressure is mildly elevated.  LVEDP 20 mm Hg.   There is no aortic valve stenosis.  Minimal luminal irregularities with no significant obstructive CAD.   Likely Takotsubo cardiomyopathy based on the RWMA noted on echo.  We will treat for likely Takotsubo cardiomyopathy with the following medication changes: Stop amlodipine Start Entresto 24-26 mg twice daily starting tomorrow as well as Lasix 20 mg daily for mildly elevated LVEDP at cath and follow renal function closely Hold beta-blocker due to active wheezing from her pneumonia If renal function mains stable after starting Entresto then will add spironolactone 12.5 mg daily and Jardiance 10 mg daily Repeat 2D echo in 8 weeks to see if LV function has normalized.Marland Kitchen

## 2022-08-20 NOTE — Progress Notes (Addendum)
ANTICOAGULATION CONSULT NOTE - Follow Up Consult  Pharmacy Consult for Heparin Indication: chest pain/ACS and hx atrial fibrillation (Apixaban on hold)  No Known Allergies  Patient Measurements: Height: 5\' 5"  (165.1 cm) Weight: 76.9 kg (169 lb 8.5 oz) IBW/kg (Calculated) : 57 Heparin Dosing Weight: 73 kg  Vital Signs: Temp Source: Oral (10/24 1128) BP: 117/41 (10/24 1800) Pulse Rate: 60 (10/24 1700)  Labs: Recent Labs    08/18/22 0038 08/18/22 0620 08/18/22 1452 08/19/22 0043 08/20/22 0132  HGB  --   --   --  9.7* 9.2*  HCT  --   --   --  29.8* 28.8*  PLT  --   --   --  247 227  APTT 54* 100* 88* 68* 83*  HEPARINUNFRC >1.10* >1.10*  --  >1.10* 1.03*  CREATININE 1.10*  --   --  1.52* 1.57*  TROPONINIHS 5,091* 4,331*  --   --   --      Estimated Creatinine Clearance: 27.4 mL/min (A) (by C-G formula based on SCr of 1.57 mg/dL (H)).  Assessment: 10 yof with a history of HLD, HTN, mitral regurg, mitral stenosis, atrial fibrillation on apixaban. Patient is presenting with cough and CP. Heparin per pharmacy consult placed for chest pain/ACS. PTA Apixaban held, last dose 10/20 pm.   Using aPTTs for heparin monitoring while heparin levels are falsely elevated due to recent Apixaban doses.  Plan R/LHC, postponed 10/23 due to severe coughing.  10/24 PM UPDATE: Patient is s/p [facility] 10/24. Per discussion w/ cards, ok to resume heparin drip 2 hours post TR band removal and they recommend to not resume Eliquis until at least 10/25. Rec's replayed to primary team.   TR band removed ~2015  Goal of Therapy:  Heparin level 0.3-0.7 units/ml aPTT 66-102 seconds Monitor platelets by anticoagulation protocol: Yes   Plan:  Resume heparin drip at 900 units/hr at 2200 tonight Daily aPTT and heparin level until correlating; daily CBC.  Apixaban on hold.  Dimple Nanas, PharmD, BCPS 08/20/2022 7:21 PM

## 2022-08-20 NOTE — Progress Notes (Signed)
ANTICOAGULATION CONSULT NOTE - Follow Up Consult  Pharmacy Consult for Heparin Indication: chest pain/ACS and hx atrial fibrillation (Apixaban on hold)  No Known Allergies  Patient Measurements: Height: 5\' 5"  (165.1 cm) Weight: 76.9 kg (169 lb 8.5 oz) IBW/kg (Calculated) : 57 Heparin Dosing Weight: 73 kg  Vital Signs: Temp: 97.8 F (36.6 C) (10/24 0718) Temp Source: Oral (10/24 0718) BP: 133/57 (10/24 0718) Pulse Rate: 68 (10/24 0718)  Labs: Recent Labs    08/17/22 1028 08/17/22 1028 08/17/22 1440 08/18/22 0038 08/18/22 0620 08/18/22 1452 08/19/22 0043 08/20/22 0132  HGB 10.5*  --   --   --   --   --  9.7* 9.2*  HCT 33.0*  --   --   --   --   --  29.8* 28.8*  PLT 263  --   --   --   --   --  247 227  APTT  --    < >  --  54* 100* 88* 68* 83*  HEPARINUNFRC  --    < >  --  >1.10* >1.10*  --  >1.10* 1.03*  CREATININE 0.89  --   --  1.10*  --   --  1.52* 1.57*  TROPONINIHS 158*  --  2,067* 5,091* 4,331*  --   --   --    < > = values in this interval not displayed.    Estimated Creatinine Clearance: 27.4 mL/min (A) (by C-G formula based on SCr of 1.57 mg/dL (H)).  Assessment: 35 yof with a history of HLD, HTN, mitral regurg, mitral stenosis, atrial fibrillation on apixaban. Patient is presenting with cough and CP. Heparin per pharmacy consult placed for chest pain/ACS. PTA Apixaban held, last dose 10/20 pm.   Using aPTTs for heparin monitoring while heparin levels are falsely elevated due to recent Apixaban doses.  Plan R/LHC, postponed 10/23 due to severe coughing.   aPTT is therapeutic (83 seconds) on heparin at 900 units/hr.  Heparin level was > 1.10, now 1.03, remains falsely elevated.  CBC trended down some, no bleeding reported.  Goal of Therapy:  Heparin level 0.3-0.7 units/ml aPTT 66-102 seconds Monitor platelets by anticoagulation protocol: Yes   Plan:  Continue heparin drip at 900 units/hr. Daily aPTT and heparin level until correlating; daily CBC. Will  follow up for [facility].  Apixaban on hold.  Arty Baumgartner, RPh 08/20/2022,7:58 AM

## 2022-08-20 NOTE — Progress Notes (Signed)
Rounding Note    Patient Name: Anna [name] Date of Encounter: 08/20/2022  [place] Cardiologist: Fransico Him, MD   Subjective   Denies any chest pain.    Inpatient Medications    Scheduled Meds:  amLODipine  5 mg Oral Daily   arformoterol  15 mcg Nebulization BID   aspirin EC  81 mg Oral Daily   benzonatate  100 mg Oral BID   budesonide (PULMICORT) nebulizer solution  0.25 mg Nebulization BID   diclofenac Sodium  2 g Topical QID   docusate sodium  100 mg Oral Daily   ferrous sulfate  325 mg Oral Q breakfast   gabapentin  100 mg Oral TID   guaiFENesin  1,200 mg Oral BID   influenza vaccine adjuvanted  0.5 mL Intramuscular Tomorrow-1000   insulin aspart  0-15 Units Subcutaneous TID WC   ipratropium-albuterol  3 mL Nebulization Q6H   methylPREDNISolone (SOLU-MEDROL) injection  40 mg Intravenous Daily   rosuvastatin  40 mg Oral Daily   sodium chloride flush  3 mL Intravenous Q12H   Continuous Infusions:  sodium chloride     sodium chloride 10 mL/hr at 08/20/22 0757   cefTRIAXone (ROCEPHIN)  IV 2 g (08/20/22 0834)   doxycycline (VIBRAMYCIN) IV 100 mg (08/19/22 2113)   heparin 900 Units/hr (08/19/22 2117)   PRN Meds: sodium chloride, acetaminophen, albuterol, cyclobenzaprine, HYDROcodone-acetaminophen, LORazepam, morphine injection, nitroGLYCERIN, sodium chloride flush   Vital Signs    Vitals:   08/20/22 0224 08/20/22 0437 08/20/22 0718 08/20/22 0819  BP:  127/64 (!) 133/57   Pulse: 60  68   Resp: 15 16 18    Temp:  97.6 F (36.4 C) 97.8 F (36.6 C)   TempSrc:  Axillary Oral   SpO2: 98% 100% 99% 100%  Weight:  76.9 kg    Height:        Intake/Output Summary (Last 24 hours) at 08/20/2022 0920 Last data filed at 08/20/2022 0700 Gross per 24 hour  Intake 412.96 ml  Output 400 ml  Net 12.96 ml       08/20/2022    4:37 AM 08/18/2022    3:41 AM 08/17/2022    6:56 PM  Last 3 Weights  Weight (lbs) 169 lb 8.5 oz 177 lb 4 oz 171 lb  Weight  (kg) 76.9 kg 80.4 kg 77.565 kg      Telemetry    Atypical atrial flutter with CVR- Personally Reviewed  ECG    No new EKG to review - Personally Reviewed  Physical Exam   GEN: Well nourished, well developed in no acute distress HEENT: Normal NECK: No JVD; No carotid bruits LYMPHATICS: No lymphadenopathy CARDIAC:RRR, no murmurs, rubs, gallops RESPIRATORY:  Clear to auscultation without rales, wheezing or rhonchi  ABDOMEN: Soft, non-tender, non-distended MUSCULOSKELETAL:  No edema; No deformity  SKIN: Warm and dry NEUROLOGIC:  Alert and oriented x 3 PSYCHIATRIC:  Normal affect  Labs    High Sensitivity Troponin:   Recent Labs  Lab 08/17/22 1028 08/17/22 1440 08/18/22 0038 08/18/22 0620  TROPONINIHS 158* 2,067* 5,091* 4,331*      Chemistry Recent Labs  Lab 08/17/22 1028 08/18/22 0038 08/18/22 0620 08/19/22 0043 08/20/22 0132  NA 136 133*  --  130* 128*  K 4.1 4.7  --  4.0 4.2  CL 99 97*  --  97* 97*  CO2 25 22  --  22 19*  GLUCOSE 152* 204*  --  137* 198*  BUN 13 19  --  29* 32*  CREATININE 0.89 1.10*  --  1.52* 1.57*  CALCIUM 9.4 9.0  --  8.5* 8.5*  MG  --   --  1.6* 2.0  --   PROT 6.9  --   --  6.0*  --   ALBUMIN 3.4*  --   --  2.8*  --   AST 24  --   --  49*  --   ALT 13  --   --  23  --   ALKPHOS 114  --   --  92  --   BILITOT 0.9  --   --  0.7  --   GFRNONAA >60 50*  --  34* 32*  ANIONGAP 12 14  --  11 12     Lipids  Recent Labs  Lab 08/18/22 0620  CHOL 69  TRIG 62  HDL 33*  LDLCALC 24  CHOLHDL 2.1     Hematology Recent Labs  Lab 08/17/22 1028 08/19/22 0043 08/20/22 0132  WBC 12.5* 11.0* 15.1*  RBC 3.64* 3.39* 3.23*  HGB 10.5* 9.7* 9.2*  HCT 33.0* 29.8* 28.8*  MCV 90.7 87.9 89.2  MCH 28.8 28.6 28.5  MCHC 31.8 32.6 31.9  RDW 13.2 13.6 13.6  PLT 263 247 227    Thyroid No results for input(s): "TSH", "FREET4" in the last 168 hours.  BNP Recent Labs  Lab 08/17/22 1440  BNP 697.2*     DDimer No results for input(s):  "DDIMER" in the last 168 hours.   Radiology    DG CHEST PORT 1 VIEW  Result Date: 08/19/2022 CLINICAL DATA:  Shortness of breath. EXAM: PORTABLE CHEST 1 VIEW COMPARISON:  08/17/2022 FINDINGS: The cardio pericardial silhouette is enlarged. Bibasilar airspace disease again noted without substantial interval change. The visualized bony structures of the thorax are unremarkable. Telemetry leads overlie the chest. IMPRESSION: No substantial interval change. Bibasilar airspace disease. Electronically Signed   By: Misty Stanley M.D.   On: 08/19/2022 16:06   ECHOCARDIOGRAM COMPLETE  Result Date: 08/18/2022    ECHOCARDIOGRAM REPORT   Patient Name:   GREYSEN GUERINO Troiano Date of Exam: 08/18/2022 Medical Rec #:  [id number]      Height:       65.0 in Accession #:    [id number]     Weight:       177.2 lb Date of Birth:  [birth year]      BSA:          1.879 m Patient Age:    86 years       BP:           113/69 mmHg Patient Gender: F              HR:           60 bpm. Exam Location:  Inpatient Procedure: 2D Echo, Color Doppler, Cardiac Doppler and Intracardiac            Opacification Agent Indications:    NSTEMI i21.4  History:        Patient has prior history of Echocardiogram examinations, most                 recent 05/06/2022. Arrythmias:Atrial Fibrillation; Risk                 Factors:Hypertension and Dyslipidemia.  Sonographer:    Raquel Sarna Senior RDCS Referring Phys: [id number] [place]  1. Left ventricular ejection fraction, by estimation, is 25%. The left ventricle has severely decreased  function. The left ventricle demonstrates regional wall motion abnormalities (see scoring diagram/findings for description). Left ventricular diastolic parameters are indeterminate.  2. Right ventricular systolic function is mildly reduced. The right ventricular size is normal. There is mildly elevated pulmonary artery systolic pressure.  3. Left atrial size was moderately dilated.  4. Right atrial size was moderately  dilated.  5. The mitral valve is abnormal. Mild mitral valve regurgitation. No evidence of mitral stenosis.  6. The tricuspid valve is abnormal. Tricuspid valve regurgitation is mild to moderate.  7. The aortic valve has an indeterminant number of cusps. There is mild calcification of the aortic valve. There is mild thickening of the aortic valve. Aortic valve regurgitation is not visualized. No aortic stenosis is present.  8. The inferior vena cava is dilated in size with <50% respiratory variability, suggesting right atrial pressure of 15 mmHg. FINDINGS  Left Ventricle: Left ventricular ejection fraction, by estimation, is 25%. The left ventricle has severely decreased function. The left ventricle demonstrates regional wall motion abnormalities. The left ventricular internal cavity size was normal in size. There is no left ventricular hypertrophy. Left ventricular diastolic parameters are indeterminate.  LV Wall Scoring: The mid and distal anterior septum and entire apex are akinetic. Right Ventricle: The right ventricular size is normal. Right vetricular wall thickness was not well visualized. Right ventricular systolic function is mildly reduced. There is mildly elevated pulmonary artery systolic pressure. The tricuspid regurgitant velocity is 2.38 m/s, and with an assumed right atrial pressure of 15 mmHg, the estimated right ventricular systolic pressure is 0000000 mmHg. Left Atrium: Left atrial size was moderately dilated. Right Atrium: Right atrial size was moderately dilated. Pericardium: There is no evidence of pericardial effusion. Mitral Valve: The mitral valve is abnormal. There is mild thickening of the mitral valve leaflet(s). There is mild calcification of the mitral valve leaflet(s). Mild mitral annular calcification. Mild mitral valve regurgitation. No evidence of mitral valve stenosis. MV peak gradient, 10.5 mmHg. The mean mitral valve gradient is 2.7 mmHg. Tricuspid Valve: The tricuspid valve is  abnormal. Tricuspid valve regurgitation is mild to moderate. No evidence of tricuspid stenosis. Aortic Valve: The aortic valve has an indeterminant number of cusps. There is mild calcification of the aortic valve. There is mild thickening of the aortic valve. There is mild aortic valve annular calcification. Aortic valve regurgitation is not visualized. No aortic stenosis is present. Aortic valve mean gradient measures 3.1 mmHg. Aortic valve peak gradient measures 6.2 mmHg. Aortic valve area, by VTI measures 2.44 cm. Pulmonic Valve: The pulmonic valve was not well visualized. Pulmonic valve regurgitation is not visualized. No evidence of pulmonic stenosis. Aorta: The aortic root is normal in size and structure. Venous: The inferior vena cava is dilated in size with less than 50% respiratory variability, suggesting right atrial pressure of 15 mmHg. IAS/Shunts: The interatrial septum was not well visualized.  LEFT VENTRICLE PLAX 2D LVIDd:         4.60 cm      Diastology LVIDs:         2.60 cm      LV e' medial:  4.90 cm/s LV PW:         1.00 cm      LV e' lateral: 7.94 cm/s LV IVS:        0.80 cm LVOT diam:     1.80 cm LV SV:         69 LV SV Index:   37 LVOT Area:  2.54 cm  LV Volumes (MOD) LV vol d, MOD A2C: 115.0 ml LV vol d, MOD A4C: 90.0 ml LV vol s, MOD A2C: 68.4 ml LV vol s, MOD A4C: 52.4 ml LV SV MOD A2C:     46.6 ml LV SV MOD A4C:     90.0 ml LV SV MOD BP:      41.9 ml RIGHT VENTRICLE RV S prime:     7.83 cm/s TAPSE (M-mode): 1.5 cm LEFT ATRIUM             Index        RIGHT ATRIUM           Index LA diam:        5.00 cm 2.66 cm/m   RA Area:     26.30 cm LA Vol (A2C):   85.6 ml 45.55 ml/m  RA Volume:   81.40 ml  43.32 ml/m LA Vol (A4C):   73.8 ml 39.27 ml/m LA Biplane Vol: 80.5 ml 42.84 ml/m  AORTIC VALVE AV Area (Vmax):    2.11 cm AV Area (Vmean):   2.26 cm AV Area (VTI):     2.44 cm AV Vmax:           124.10 cm/s AV Vmean:          84.326 cm/s AV VTI:            0.282 m AV Peak Grad:      6.2  mmHg AV Mean Grad:      3.1 mmHg LVOT Vmax:         103.00 cm/s LVOT Vmean:        74.900 cm/s LVOT VTI:          0.270 m LVOT/AV VTI ratio: 0.96  AORTA Ao Root diam: 3.20 cm Ao Asc diam:  3.20 cm MITRAL VALVE              TRICUSPID VALVE MV Area (PHT): 1.47 cm   TR Peak grad:   22.7 mmHg MV Area VTI:   1.53 cm   TR Vmax:        238.00 cm/s MV Peak grad:  10.5 mmHg MV Mean grad:  2.7 mmHg   SHUNTS MV Vmax:       1.62 m/s   Systemic VTI:  0.27 m MV Vmean:      69.5 cm/s  Systemic Diam: 1.80 cm Carlyle Dolly MD Electronically signed by Carlyle Dolly MD Signature Date/Time: 08/18/2022/1:46:08 PM    Final     Cardiac Studies   Echocardiogram 08/18/22  1. Left ventricular ejection fraction, by estimation, is 25%. The left  ventricle has severely decreased function. The left ventricle demonstrates  regional wall motion abnormalities (see scoring diagram/findings for  description). Left ventricular  diastolic parameters are indeterminate.   2. Right ventricular systolic function is mildly reduced. The right  ventricular size is normal. There is mildly elevated pulmonary artery  systolic pressure.   3. Left atrial size was moderately dilated.   4. Right atrial size was moderately dilated.   5. The mitral valve is abnormal. Mild mitral valve regurgitation. No  evidence of mitral stenosis.   6. The tricuspid valve is abnormal. Tricuspid valve regurgitation is mild  to moderate.   7. The aortic valve has an indeterminant number of cusps. There is mild  calcification of the aortic valve. There is mild thickening of the aortic  valve. Aortic valve regurgitation is not visualized. No aortic stenosis is  present.  8. The inferior vena cava is dilated in size with <50% respiratory  variability, suggesting right atrial pressure of 15 mmHg.   Patient Profile     86 y.o. female with history of atrial flutter on Eliquis, hypertension, hyperlipidemia presented with chest pain, shortness of breath,  nausea.  Assessment & Plan    NSTEMI  - Patient presented complaining of chest pain. hsTn 158>>2067>>5091>>4331. EKG with subtle ST elevations in V2-5 with new TWI from prior EKG.   - Patient now on IV heparin  - Last dose of Eliquis was 10/21 AM - she is NPO for cath this am - continue ASA, IV Heparin gtt, statin - no BB due to wheezing and hx of bradycardia - plan for cath today ( no LVgram due to bump in SCr) to define coronary anatomy  Acute systolic heart failure  Ischemic cardiomyopathy  - Echo from 04/2022 with EF 55-60% without regional wall motion abnormalities. Echo this admission with EF 25%, regional wall motion abnormalities, mildly reduced RV systolic function  - Planned to undergo L/R heart catheterization today  - CXR with possible NPA vs edema. BNP elevated to 677 - Patient was on IV lasix, creatinine bumped to 1.52 (up from 1.10) - Lasix held to protect renal function prior to cath.   - Will need to start/titrate GDMT after cath as BP and renal function allow.  As above, creatinine up to 1.52 after a few days of IV lasix. Home irbesartan held. Not on BB due to history of bradycardia   Chronic atrial flutter  - HR adequately controlled and not requiring any AVN blocking agents - continue IV Heparin gtt and restart DOAC post cath  Multifocal PNA  - On antibiotics per primary     I have spent a total of 35 minutes with patient reviewing 2D echo , telemetry, EKGs, labs and examining patient as well as establishing an assessment and plan that was discussed with the patient.  > 50% of time was spent in direct patient care.    For questions or updates, please contact [place] Please consult [email or web address] for contact info under        Signed, Fransico Him, MD  08/20/2022, 9:20 AM

## 2022-08-20 NOTE — Progress Notes (Signed)
   Heart Failure Stewardship Pharmacist Progress Note   PCP: Pcp, No PCP-Cardiologist: Fransico Him, MD    HPI:  86 yo F with PMH of aflutter, HTN, and HLD.  Presented to the ED on 10/21 with chest pain, cough, and shortness of breath. CXR with suspicions for PNA vs mild pulmonary edema. Chest CT with cardiomegaly. Troponin elevated and peaked to 5091. Admitted for NSTEMI. An ECHO was done on 10/22 and showed LVEF 25%, regional wall motion abnormalities, RV mildly reduced, mild MR, and mild-moderate TR. Prior ECHO in July 2023 with LVEF 55-60%  R/LHC on 10/24. Palliative care consulted.  Current HF Medications: None  Prior to admission HF Medications: Diuretic: furosemide 10 mg daily PRN ACE/ARB/ARNI: olmesartan 20 mg daily  Pertinent Lab Values: Serum creatinine 1.10>>1.57, BUN 32, Potassium 4.2, Sodium 128, BNP 697.2, Magnesium 2.0, A1c 5.8   Vital Signs: Weight: 169 lbs (admission weight: 177 lbs) Blood pressure: 130/60s  Heart rate: 50-70s  I/O: -951mL yesterday; net 155mL  Medication Assistance / Insurance Benefits Check: Does the patient have prescription insurance?  Yes Type of insurance plan: [place] Medicaid  Outpatient Pharmacy:  Prior to admission outpatient pharmacy: [facility] Is the patient willing to use [facility] at discharge? Yes Is the patient willing to transition their outpatient pharmacy to utilize a [facility] outpatient pharmacy?   Pending    Assessment: 1. Acute systolic CHF (LVEF 64%), pending R/LHC. NYHA class II symptoms. - Off IV lasix with AKI. Strict I/Os and daily weights. Keep K>4 and Mag>2. - Not on BB due to history of bradycardia - Further GDMT pending cath findings and renal function trends   Plan: 1) Medication changes recommended at this time: - None pending cath  2) Patient assistance: - Has [place] Medicaid  3)  Education  - Patient has been educated on potential additions to HF medication regimen - Patient verbalizes understanding  that over the next few months, these medication doses may change and more medications may be added to optimize HF regimen - Patient has been educated on basic disease state pathophysiology and goals of therapy   Kerby Nora, PharmD, BCPS Heart Failure Stewardship Pharmacist Phone [phone]

## 2022-08-20 NOTE — Progress Notes (Signed)
PROGRESS NOTE    Anna [name]  [id number] DOB: 1936-06-06 DOA: 08/17/2022 PCP: Pcp, No  Chief Complaint  Patient presents with   Shortness of Breath    Brief Narrative:  Anna [name] is Anna [name] 86 y.o. female with medical history significant of chronic [name] flutter on Eliquis, mitral regurgitation/stenosis, HTN, HLD, came with new onset of cough shortness of breath chest pain and nausea.  She's been diagnosed with an NSTEMI and community acquired pneumonia.  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  Will consider spironolactone and jardiance based on renal function. - strict I/O, daily weights  Acute hypoxic respiratory failure  Multifocal Pneumonia  Wheezing  Cough - CT findings concerning for multifocal pneumonia - continue ceftriaxone/doxycycline - follow blood cultures ngtdx2, sputum culture pending - urine strep negative, urine legionella negative - continue nebs, steroids - beta blocker on hold for wheezing - morphine available prn for cough if not improving with other meds   Acute Kidney Injury - worsened in setting of diuretics - diuretics per cards, also started entresto - will need to watch renal function closely - she received contrast today -  renal ultrasound - UA pending  Chronic [name] flutter  -Rate controlled, on heparin drip for NSTEMI.  History of combined mitral stenosis and mitral regurgitation -per cardiology -echo with indeterminate number of cusp of AV, follow outpatient  Hypertension - amlodipine, olmesartan  T2DM - ssi -a1c 5.8  Goals of care - discussed with patient and daughter Anna [name]) at bedside on 10/23.  Patient wants to be DNR she brought up that this is what she'd want and I confirmed with her that she knew what this meant in the hospital.  She was alert and oriented and expressed understanding.  Today, 10/24, I got Anna [name] message from the nurse that she'd changed status to full code after having conversation with granddaughter.  Will change back to full code, appreciate palliative care consult.     Abnormal Imaging Findings - needs outpatient follow up for mediastinal and right hilar adenopathy, pulmonary nodules, enlarged L ovary, heterogenicity and fat stranding of R pericolic gutter peritoneum     DVT prophylaxis: heparin gtt Code Status: full Family Communication: none Disposition:   Status is: Inpatient Remains inpatient appropriate because: need for further cardiology eval   Consultants:  cardiology  Procedures:    LV end diastolic pressure is mildly elevated.  LVEDP 20 mm Hg.   There is no aortic valve stenosis.   Mild, nonobstructive CAD.  Echo IMPRESSIONS     1. Left ventricular ejection fraction, by estimation, is 25%. The left  ventricle has severely decreased function. The left ventricle demonstrates  regional wall motion abnormalities (see scoring diagram/findings for  description). Left ventricular  diastolic parameters are indeterminate.   2. Right ventricular systolic  function is mildly reduced. The right  ventricular size is normal. There is mildly elevated pulmonary artery  systolic pressure.   3. Left atrial size was moderately dilated.   4. Right atrial size was  moderately dilated.   5. The mitral valve is abnormal. Mild mitral valve regurgitation. No  evidence of mitral stenosis.   6. The tricuspid valve is abnormal. Tricuspid valve regurgitation is mild  to moderate.   7. The aortic valve has an indeterminant number of cusps. There is mild  calcification of the aortic valve. There is mild thickening of the aortic  valve. Aortic valve regurgitation is not visualized. No aortic stenosis is  present.   8. The inferior vena cava is dilated in size with <50% respiratory  variability, suggesting right atrial pressure of 15 mmHg.  Antimicrobials:  Anti-infectives (From admission, onward)    Start     Dose/Rate Route Frequency Ordered Stop   08/18/22 1000  cefTRIAXone (ROCEPHIN) 1 g in sodium chloride 0.9 % 100 mL IVPB  Status:  Discontinued        1 g 200 mL/hr over 30 Minutes Intravenous Every 24 hours 08/17/22 1800 08/17/22 1840   08/18/22 1000  azithromycin (ZITHROMAX) tablet 500 mg  Status:  Discontinued        500 mg Oral Daily 08/17/22 1800 08/18/22 0409   08/18/22 1000  cefTRIAXone (ROCEPHIN) 2 g in sodium chloride 0.9 % 100 mL IVPB        2 g 200 mL/hr over 30 Minutes Intravenous Every 24 hours 08/17/22 1840     08/18/22 0600  doxycycline (VIBRAMYCIN) 100 mg in sodium chloride 0.9 % 250 mL IVPB        100 mg 125 mL/hr over 120 Minutes Intravenous 2 times daily 08/18/22 0409     08/17/22 1245  cefTRIAXone (ROCEPHIN) 2 g in sodium chloride 0.9 % 100 mL IVPB        2 g 200 mL/hr over 30 Minutes Intravenous  Once 08/17/22 1238 08/17/22 1507   08/17/22 1245  azithromycin (ZITHROMAX) 500 mg in sodium chloride 0.9 % 250 mL IVPB        500 mg 250 mL/hr over 60 Minutes Intravenous  Once 08/17/22 1238 08/17/22 1813       Subjective: Says she's feeling better  Objective: Vitals:   08/20/22 1526 08/20/22 1531 08/20/22 1600 08/20/22 1700  BP: (!) 149/66 123/69 (!) 146/66 128/66  Pulse: 80 78 63 60  Resp: 14 (!) 22  19  Temp:       TempSrc:      SpO2: (!) 86% (!) 87%    Weight:      Height:        Intake/Output Summary (Last 24 hours) at 08/20/2022 1758 Last data filed at 08/20/2022 1243 Gross per 24 hour  Intake 212.96 ml  Output 350 ml  Net -137.04 ml   Filed Weights   08/17/22 1856 08/18/22 0341 08/20/22 0437  Weight: 77.6 kg 80.4 kg 76.9 kg    Examination:  Seen while she was working with therapy General: No acute distress. Sitting up at edge of bed. CV: irregular on tele Lungs: unlabored Neurological: Alert and oriented 3. Moves all extremities 4 with equal strength. Cranial nerves II through XII grossly intact. Extremities: No clubbing or cyanosis. No edema.   Data Reviewed: I have personally reviewed following labs and imaging studies  CBC: Recent Labs  Lab 08/17/22 1028 08/19/22 0043 08/20/22 0132  WBC 12.5* 11.0* 15.1*  NEUTROABS 10.6* 7.2  --   HGB 10.5* 9.7* 9.2*  HCT 33.0* 29.8* 28.8*  MCV 90.7 87.9 89.2  PLT 263 247 865    Basic Metabolic Panel: Recent Labs  Lab 08/17/22 1028 08/18/22 0038 08/18/22 0620 08/19/22 0043 08/20/22 0132  NA 136 133*  --  130* 128*  K 4.1 4.7  --  4.0 4.2  CL 99 97*  --  97* 97*  CO2 25 22  --  22 19*  GLUCOSE 152* 204*  --  137* 198*  BUN 13 19  --  29* 32*  CREATININE 0.89 1.10*  --  1.52* 1.57*  CALCIUM 9.4 9.0  --  8.5* 8.5*  MG  --   --  1.6* 2.0  --   PHOS  --   --   --  3.6  --     GFR: Estimated Creatinine Clearance: 27.4 mL/min (Rian Koon) (by C-G formula based on SCr of 1.57 mg/dL (H)).  Liver Function Tests: Recent Labs  Lab 08/17/22 1028 08/19/22 0043  AST 24 49*  ALT 13 23  ALKPHOS 114 92  BILITOT 0.9 0.7  PROT 6.9 6.0*  ALBUMIN 3.4* 2.8*    CBG: Recent Labs  Lab 08/19/22 1605 08/19/22 2051 08/20/22 0637 08/20/22 1055 08/20/22 1612  GLUCAP 142* 172* 212* 146* 148*     Recent Results (from the past 240 hour(s))  Resp Panel by RT-PCR (Flu Rivers Gassmann&B, Covid) Anterior Nasal Swab     Status: None   Collection Time:  08/17/22 10:09 AM   Specimen: Anterior Nasal Swab  Result Value Ref Range Status   SARS Coronavirus 2 by RT PCR NEGATIVE NEGATIVE Final    Comment: (NOTE) SARS-CoV-2 target nucleic acids are NOT DETECTED.  The SARS-CoV-2 RNA is generally detectable in upper respiratory specimens during the acute phase of infection. The lowest concentration of SARS-CoV-2 viral copies this assay can detect is 138 copies/mL. Samaa Ueda negative result does not preclude SARS-Cov-2 infection and should not be used as the sole basis for treatment or other patient management decisions. Jaquell Seddon negative result may occur with  improper specimen collection/handling, submission of specimen other than nasopharyngeal swab, presence of viral mutation(s) within the areas targeted by this assay, and inadequate number of viral copies(<138 copies/mL). Ambriel Gorelick negative result must be combined with clinical observations, patient history, and epidemiological information. The expected result is Negative.  Fact Sheet for Patients:  EntrepreneurPulse.com.au  Fact Sheet for Healthcare Providers:  IncredibleEmployment.be  This test is no t yet approved or cleared by the [place] FDA and  has been authorized for detection and/or diagnosis of SARS-CoV-2 by FDA under an Emergency Use Authorization (EUA). This EUA will remain  in effect (meaning this test can be used) for the duration of the COVID-19 declaration under Section 564(b)(1) of the Act, 21 U.S.C.section 360bbb-3(b)(1), unless the authorization is terminated  or revoked sooner.       Influenza Leeloo Silverthorne by PCR NEGATIVE NEGATIVE Final   Influenza B by PCR NEGATIVE NEGATIVE Final    Comment: (NOTE) The Xpert Xpress SARS-CoV-2/FLU/RSV plus assay is intended as an aid in the diagnosis of influenza from Nasopharyngeal swab specimens and should not be used as Mose Colaizzi sole basis for treatment. Nasal washings and aspirates are unacceptable for Xpert Xpress  SARS-CoV-2/FLU/RSV testing.  Fact Sheet for Patients: EntrepreneurPulse.com.au  Fact Sheet for Healthcare Providers: IncredibleEmployment.be  This test is not yet approved or cleared by the [place] FDA and has been authorized for detection and/or diagnosis of  SARS-CoV-2 by FDA under an Emergency Use Authorization (EUA). This EUA will remain in effect (meaning this test can be used) for the duration of the COVID-19 declaration under Section 564(b)(1) of the Act, 21 U.S.C. section 360bbb-3(b)(1), unless the authorization is terminated or revoked.  Performed at [facility], [place] [address], [place], [place] [address]   Culture, blood (routine x 2)     Status: None (Preliminary result)   Collection Time: 08/17/22 12:38 PM   Specimen: BLOOD  Result Value Ref Range Status   Specimen Description BLOOD RIGHT ANTECUBITAL  Final   Special Requests   Final    BOTTLES DRAWN AEROBIC AND ANAEROBIC Blood Culture adequate volume   Culture   Final    NO GROWTH 3 DAYS Performed at [facility], [place] [address], [place], [place] [address]    Report Status PENDING  Incomplete  Culture, blood (routine x 2)     Status: None (Preliminary result)   Collection Time: 08/17/22 12:43 PM   Specimen: BLOOD  Result Value Ref Range Status   Specimen Description BLOOD BLOOD RIGHT HAND  Final   Special Requests   Final    BOTTLES DRAWN AEROBIC AND ANAEROBIC Blood Culture results may not be optimal due to an inadequate volume of blood received in culture bottles   Culture   Final    NO GROWTH 3 DAYS Performed at [facility], [place] [address] [place], [place] [address]    Report Status PENDING  Incomplete  Expectorated Sputum Assessment w Gram Stain, Rflx to Resp Cult     Status: None   Collection Time: 08/18/22 10:27 PM   Specimen: Sputum  Result Value Ref Range Status   Specimen Description SPUTUM  Final   Special Requests NONE  Final   Sputum  evaluation   Final    THIS SPECIMEN IS ACCEPTABLE FOR SPUTUM CULTURE Performed at [facility], [place] [address], [place], [place] [address]    Report Status 08/20/2022 FINAL  Final  Culture, Respiratory w Gram Stain     Status: None (Preliminary result)   Collection Time: 08/18/22 10:27 PM   Specimen: SPU  Result Value Ref Range Status   Specimen Description SPUTUM  Final   Special Requests NONE Reflexed from [id number]  Final   Gram Stain   Final    RARE WBC PRESENT,BOTH PMN AND MONONUCLEAR NO ORGANISMS SEEN    Culture   Final    CULTURE REINCUBATED FOR BETTER GROWTH Performed at [facility], [place] [address] [place], [place] [address]    Report Status PENDING  Incomplete         Radiology Studies: CARDIAC CATHETERIZATION  Result Date: [id number]   LV end diastolic pressure is mildly elevated.  LVEDP 20 mm Hg.   There is no aortic valve stenosis.   Mild, nonobstructive CAD. Likely Takotsubo cardiomyopathy based on the RWMA noted on echo.  Results conveyed to her granddaughter, [name].   DG CHEST PORT 1 VIEW  Result Date: 08/19/2022 CLINICAL DATA:  Shortness of breath. EXAM: PORTABLE CHEST 1 VIEW COMPARISON:  08/17/2022 FINDINGS: The cardio pericardial silhouette is enlarged. Bibasilar airspace disease again noted without substantial interval change. The visualized bony structures of the thorax are unremarkable. Telemetry leads overlie the chest. IMPRESSION: No substantial interval change. Bibasilar airspace disease. Electronically Signed   By: Misty Stanley M.D.   On: 08/19/2022 16:06        Scheduled Meds:  arformoterol  15 mcg Nebulization BID   aspirin EC  81 mg Oral Daily   benzonatate  100 mg Oral BID   budesonide (PULMICORT) nebulizer solution  0.25 mg Nebulization BID   diclofenac Sodium  2 g Topical QID   docusate sodium  100 mg Oral Daily   ferrous sulfate  325 mg Oral Q breakfast   [START ON 08/21/2022] furosemide  20 mg Oral Daily   gabapentin  100  mg Oral TID   guaiFENesin  1,200 mg Oral BID   influenza vaccine adjuvanted  0.5 mL Intramuscular Tomorrow-1000   insulin aspart  0-15 Units Subcutaneous TID WC   methylPREDNISolone (SOLU-MEDROL) injection  40 mg Intravenous Daily   rosuvastatin  40 mg Oral Daily   [START ON 08/21/2022] sacubitril-valsartan  1 tablet Oral BID   sodium chloride flush  3 mL Intravenous Q12H   sodium chloride flush  3 mL Intravenous Q12H   Continuous Infusions:  sodium chloride 75 mL/hr at 08/20/22 1626   sodium chloride     cefTRIAXone (ROCEPHIN)  IV Stopped (08/20/22 0934)   doxycycline (VIBRAMYCIN) IV 100 mg (08/20/22 0936)     LOS: 3 days    Time spent: over 30 min    Fayrene Helper, MD [facility]   To contact the attending provider between 7A-7P or the covering provider during after hours 7P-7A, please log into the web site [email or web address] and access using universal [place] password for that web site. If you do not have the password, please call the hospital operator.  08/20/2022, 5:58 PM

## 2022-08-20 NOTE — Interval H&P Note (Signed)
Cath Lab Visit (complete for each Cath Lab visit)  Clinical Evaluation Leading to the Procedure:   ACS: Yes.    Non-ACS:    Anginal Classification: CCS IV  Anti-ischemic medical therapy: Minimal Therapy (1 class of medications)  Non-Invasive Test Results: High-risk stress test findings: cardiac mortality >3%/year  Prior CABG: No previous CABG   Low EF   History and Physical Interval Note:  08/20/2022 2:55 PM  Anna [name]  has presented today for surgery, with the diagnosis of NSTEMI.  The various methods of treatment have been discussed with the patient and family. After consideration of risks, benefits and other options for treatment, the patient has consented to  Procedure(s): RIGHT/LEFT HEART CATH AND CORONARY ANGIOGRAPHY (N/A) as a surgical intervention.  The patient's history has been reviewed, patient examined, no change in status, stable for surgery.  I have reviewed the patient's chart and labs.  Questions were answered to the patient's satisfaction.     Larae Grooms

## 2022-08-20 NOTE — Progress Notes (Signed)
Heart Failure Nurse Navigator Progress Note   Patient getting ready to go for Left heart cath, family at bedside. Will plan to interview in am on 08/21/22.  Anna [name], BSN, Clinical cytogeneticist Only

## 2022-08-20 NOTE — Consult Note (Signed)
Consultation Note Date: 08/20/2022   Patient Name: Anna [name]  DOB: 11-15-1935  MRN: [id number]  Age / Sex: 86 y.o., female  PCP: Pcp, No Referring Physician: Elodia Florence., *  Reason for Consultation: goals of care  HPI/Patient Profile: 86 y.o. female  with past medical history of a flutter, mitral regurg/stenosis, HTN, HLD admitted on 08/17/2022 with shortness of breath. Workup reveals NSTEMI and she's been started on heparin drip. Going for cardiac cath today. She also has pneumonia and is being treated with antibiotics.  ECHO this admission shows EF 25%. No prior hospitalizations. Palliative medicine consulted for goals of care.   Primary Decision Maker PATIENT - per report has HCPOA designating Sylvester Harder, granddaughter [name] also participates in medical decisions  Discussion: Chart reviewed including labs progress notes and imaging. Evaluated patient.  She was awake and alert.  Her granddaughter was at the bedside. Introduced palliative medicine. Palliative medicine is specialized medical care for people living with serious illness. It focuses on providing relief from the symptoms and stress of a serious illness. The goal is to improve quality of life for both the patient and the family. Answered questions regarding purpose of palliative consult. Consult was interrupted due to cast team arriving to take patient for cardiac cath. Plan to continue consult tomorrow at 1 PM.  SUMMARY OF RECOMMENDATIONS -Continue full scope care -Patient has a healthcare power of attorney and a living will-I have asked for family to bring a copy of this so we can scan into her chart -She has a DNR order in place -Plan for follow-up meeting tomorrow at 1 PM  Code Status/Advance Care Planning: DNR   Prognosis:   Unable to determine  Discharge Planning: To Be Determined  Primary  Diagnoses: Present on Admission: **None**   Review of Systems  Physical Exam  Vital Signs: BP 131/75 (BP Location: Right Wrist)   Pulse 76   Temp 97.8 F (36.6 C) (Oral)   Resp 16   Ht 5\' 5"  (1.651 m)   Wt 76.9 kg   SpO2 100%   BMI 28.21 kg/m  Pain Scale: 0-10   Pain Score: 0-No pain   SpO2: SpO2: 100 % O2 Device:SpO2: 100 % O2 Flow Rate: .O2 Flow Rate (L/min): 2 L/min  IO: Intake/output summary:  Intake/Output Summary (Last 24 hours) at 08/20/2022 1423 Last data filed at 08/20/2022 1243 Gross per 24 hour  Intake 212.96 ml  Output 350 ml  Net -137.04 ml    LBM: Last BM Date : 08/19/22 Baseline Weight: Weight: 77.6 kg Most recent weight: Weight: 76.9 kg       Thank you for this consult. Palliative medicine will continue to follow and assist as needed.  Time Total: 75 minutes Greater than 50%  of this time was spent counseling and coordinating care related to the above assessment and plan.  Signed by: Mariana Kaufman, AGNP-C Palliative Medicine    Please contact Palliative Medicine Team phone at [phone] for questions and concerns.  For individual provider:  See Amion

## 2022-08-21 ENCOUNTER — Encounter (HOSPITAL_COMMUNITY): Payer: Self-pay | Admitting: Interventional Cardiology

## 2022-08-21 DIAGNOSIS — R7989 Other specified abnormal findings of blood chemistry: Secondary | ICD-10-CM

## 2022-08-21 DIAGNOSIS — I214 Non-ST elevation (NSTEMI) myocardial infarction: Secondary | ICD-10-CM | POA: Diagnosis not present

## 2022-08-21 DIAGNOSIS — J189 Pneumonia, unspecified organism: Secondary | ICD-10-CM

## 2022-08-21 DIAGNOSIS — I4892 Unspecified atrial flutter: Secondary | ICD-10-CM

## 2022-08-21 DIAGNOSIS — I5181 Takotsubo syndrome: Secondary | ICD-10-CM | POA: Diagnosis not present

## 2022-08-21 DIAGNOSIS — I5021 Acute systolic (congestive) heart failure: Secondary | ICD-10-CM | POA: Diagnosis not present

## 2022-08-21 DIAGNOSIS — Z7189 Other specified counseling: Secondary | ICD-10-CM | POA: Diagnosis not present

## 2022-08-21 LAB — GLUCOSE, CAPILLARY
Glucose-Capillary: 160 mg/dL — ABNORMAL HIGH (ref 70–99)
Glucose-Capillary: 200 mg/dL — ABNORMAL HIGH (ref 70–99)
Glucose-Capillary: 175 mg/dL — ABNORMAL HIGH (ref 70–99)
Glucose-Capillary: 197 mg/dL — ABNORMAL HIGH (ref 70–99)

## 2022-08-21 LAB — CBC WITH DIFFERENTIAL/PLATELET
Abs Immature Granulocytes: 0.04 10*3/uL (ref 0.00–0.07)
Basophils Absolute: 0 10*3/uL (ref 0.0–0.1)
Basophils Relative: 0 %
Eosinophils Absolute: 0 10*3/uL (ref 0.0–0.5)
Eosinophils Relative: 0 %
HCT: 29.8 % — ABNORMAL LOW (ref 36.0–46.0)
Hemoglobin: 9.8 g/dL — ABNORMAL LOW (ref 12.0–15.0)
Immature Granulocytes: 0 %
Lymphocytes Relative: 7 %
Lymphs Abs: 0.7 10*3/uL (ref 0.7–4.0)
MCH: 28.7 pg (ref 26.0–34.0)
MCHC: 32.9 g/dL (ref 30.0–36.0)
MCV: 87.4 fL (ref 80.0–100.0)
Monocytes Absolute: 0.5 10*3/uL (ref 0.1–1.0)
Monocytes Relative: 4 %
Neutro Abs: 9.6 10*3/uL — ABNORMAL HIGH (ref 1.7–7.7)
Neutrophils Relative %: 89 %
Platelets: 248 10*3/uL (ref 150–400)
RBC: 3.41 MIL/uL — ABNORMAL LOW (ref 3.87–5.11)
RDW: 13.8 % (ref 11.5–15.5)
WBC: 10.9 10*3/uL — ABNORMAL HIGH (ref 4.0–10.5)
nRBC: 0 % (ref 0.0–0.2)

## 2022-08-21 LAB — HEPARIN LEVEL (UNFRACTIONATED): Heparin Unfractionated: 0.64 IU/mL (ref 0.30–0.70)

## 2022-08-21 LAB — COMPREHENSIVE METABOLIC PANEL
ALT: 37 U/L (ref 0–44)
AST: 55 U/L — ABNORMAL HIGH (ref 15–41)
Albumin: 2.8 g/dL — ABNORMAL LOW (ref 3.5–5.0)
Alkaline Phosphatase: 93 U/L (ref 38–126)
Anion gap: 12 (ref 5–15)
BUN: 39 mg/dL — ABNORMAL HIGH (ref 8–23)
CO2: 19 mmol/L — ABNORMAL LOW (ref 22–32)
Calcium: 8.8 mg/dL — ABNORMAL LOW (ref 8.9–10.3)
Chloride: 96 mmol/L — ABNORMAL LOW (ref 98–111)
Creatinine, Ser: 1.78 mg/dL — ABNORMAL HIGH (ref 0.44–1.00)
GFR, Estimated: 28 mL/min — ABNORMAL LOW (ref >60)
Glucose, Bld: 169 mg/dL — ABNORMAL HIGH (ref 70–99)
Potassium: 4.3 mmol/L (ref 3.5–5.1)
Sodium: 127 mmol/L — ABNORMAL LOW (ref 135–145)
Total Bilirubin: 0.6 mg/dL (ref 0.3–1.2)
Total Protein: 6.2 g/dL — ABNORMAL LOW (ref 6.5–8.1)

## 2022-08-21 LAB — MAGNESIUM: Magnesium: 2 mg/dL (ref 1.7–2.4)

## 2022-08-21 LAB — PHOSPHORUS: Phosphorus: 4.6 mg/dL (ref 2.5–4.6)

## 2022-08-21 LAB — CULTURE, RESPIRATORY W GRAM STAIN: Culture: NORMAL

## 2022-08-21 LAB — APTT: aPTT: 52 seconds — ABNORMAL HIGH (ref 24–36)

## 2022-08-21 MED ORDER — APIXABAN 2.5 MG PO TABS
2.5000 mg | ORAL_TABLET | Freq: Two times a day (BID) | ORAL | Status: DC
  Administered 2022-08-21 – 2022-08-24 (×7): 2.5 mg via ORAL
  Filled 2022-08-21 (×7): qty 1

## 2022-08-21 NOTE — Progress Notes (Signed)
   Heart Failure Stewardship Pharmacist Progress Note   PCP: Pcp, No PCP-Cardiologist: Fransico Him, MD    HPI:  86 yo F with PMH of aflutter, HTN, and HLD.  Presented to the ED on 10/21 with chest pain, cough, and shortness of breath. CXR with suspicions for PNA vs mild pulmonary edema. Chest CT with cardiomegaly. Troponin elevated and peaked to 5091. Admitted for NSTEMI. An ECHO was done on 10/22 and showed LVEF 25%, regional wall motion abnormalities, RV mildly reduced, mild MR, and mild-moderate TR. Prior ECHO in July 2023 with LVEF 55-60%  LHC on 10/24 showed mild nonobstructive CAD, consistent with Takotsubo.  Current HF Medications: None  Prior to admission HF Medications: Diuretic: furosemide 10 mg daily PRN ACE/ARB/ARNI: olmesartan 20 mg daily  Pertinent Lab Values: Serum creatinine 1.10>>1.57>1.78, BUN 39, Potassium 4.3, Sodium 127, BNP 697.2, Magnesium 2.0, A1c 5.8   Vital Signs: Weight: 183 lbs (admission weight: 177 lbs) Blood pressure: 130/60s  Heart rate: 50-70s  I/O: -990mL yesterday; net 141mL  Medication Assistance / Insurance Benefits Check: Does the patient have prescription insurance?  Yes Type of insurance plan: [place] [employer]  Outpatient Pharmacy:  Prior to admission outpatient [facility] Is the patient willing to use [facility] at discharge? Yes Is the patient willing to transition their outpatient pharmacy to utilize a [facility] outpatient pharmacy?   Pending    Assessment: 1. Acute systolic CHF (LVEF 05%), due to Takotsubo CM. LHC with nonobstructive CAD. NYHA class II symptoms. - Off IV lasix with AKI. Strict I/Os and daily weights. Keep K>4 and Mag>2. - Not on BB due to history of bradycardia. - Holding Entresto with AKI. - Restart GDMT once SCr improves.   Plan: 1) Medication changes recommended at this time: - None pending improvement in renal function  2) Patient assistance: - Has [place] Medicaid  3)  Education  - Patient has  been educated on potential additions to HF medication regimen - Patient verbalizes understanding that over the next few months, these medication doses may change and more medications may be added to optimize HF regimen - Patient has been educated on basic disease state pathophysiology and goals of therapy   Kerby Nora, PharmD, BCPS Heart Failure Stewardship Pharmacist Phone [phone]

## 2022-08-21 NOTE — Progress Notes (Signed)
Occupational Therapy Treatment Patient Details Name: Anna [name] MRN: [id number] DOB: 1936-06-28 Today's Date: 08/21/2022   History of present illness 86 y.o. female presents to [facility] hospital on 08/17/2022 with new onset cough, SOB, nausea and chest pain. Chest x-ray suspicious for PNA. Troponins uptrending with concern for NSTEMI. PMH includes HLD, HTN, mitral regurgitation, OA, PAF.   OT comments  Pt progressing towards goals this session, needing min guard-min A for ADLs, able to stand at sink x10 mins for grooming task, supported by forearms due to fatigue. 3/4 DOE noted with grooming task. Pt needing min-mod A +2 for bed mobility and min A +2 for transfers with RW. Pt presenting with impairments listed below, will follow acutely. Continue to recommend HHOT at d/c.   Recommendations for follow up therapy are one component of a multi-disciplinary discharge planning process, led by the attending physician.  Recommendations may be updated based on patient status, additional functional criteria and insurance authorization.    Follow Up Recommendations  Home health OT    Assistance Recommended at Discharge Frequent or constant Supervision/Assistance  Patient can return home with the following  A lot of help with walking and/or transfers;A little help with bathing/dressing/bathroom;Assistance with cooking/housework;Direct supervision/assist for medications management;Direct supervision/assist for financial management;Assist for transportation;Help with stairs or ramp for entrance   Equipment Recommendations  Tub/shower seat    Recommendations for Other Services PT consult    Precautions / Restrictions Precautions Precautions: Fall Restrictions Weight Bearing Restrictions: No       Mobility Bed Mobility Overal bed mobility: Needs Assistance Bed Mobility: Supine to Sit     Supine to sit: Min assist, Mod assist, +2 for physical assistance     General bed mobility comments:  increased assist needed due to attempts to minimize use of RUE from heart cath    Transfers Overall transfer level: Needs assistance Equipment used: Rolling walker (2 wheels) Transfers: Sit to/from Stand Sit to Stand: Min assist, +2 physical assistance                 Balance Overall balance assessment: Needs assistance Sitting-balance support: Feet supported, Bilateral upper extremity supported Sitting balance-Leahy Scale: Fair Sitting balance - Comments: can reach minimally outside BOS without LOB   Standing balance support: During functional activity, Reliant on assistive device for balance Standing balance-Leahy Scale: Poor Standing balance comment: reliant on external support                           ADL either performed or assessed with clinical judgement   ADL Overall ADL's : Needs assistance/impaired     Grooming: Min guard;Standing;Oral care;Wash/dry face Grooming Details (indicate cue type and reason): pt with forearms on counter for support         Upper Body Dressing : Minimal assistance;Sitting       Toilet Transfer: Minimal assistance;Rolling walker (2 wheels);Ambulation;Regular Glass blower/designer Details (indicate cue type and reason): simulated via functional mobility         Functional mobility during ADLs: Minimal assistance;Rolling walker (2 wheels)      Extremity/Trunk Assessment Upper Extremity Assessment Upper Extremity Assessment: Generalized weakness   Lower Extremity Assessment Lower Extremity Assessment: Defer to PT evaluation        Vision   Vision Assessment?: No apparent visual deficits   Perception Perception Perception: Not tested   Praxis Praxis Praxis: Not tested    Cognition Arousal/Alertness: Awake/alert Behavior During Therapy: Flat affect Overall  Cognitive Status: Within Functional Limits for tasks assessed                                 General Comments: overall, WFL, good  sense of humor        Exercises      Shoulder Instructions       General Comments VSS on RA    Pertinent Vitals/ Pain       Pain Assessment Pain Assessment: No/denies pain  Home Living                                          Prior Functioning/Environment              Frequency  Min 2X/week        Progress Toward Goals  OT Goals(current goals can now be found in the care plan section)  Progress towards OT goals: Progressing toward goals  Acute Rehab OT Goals Patient Stated Goal: none stated OT Goal Formulation: With patient Time For Goal Achievement: 08/28/22 Potential to Achieve Goals: Good ADL Goals Pt Will Perform Upper Body Dressing: with min guard assist;sitting Pt Will Perform Lower Body Dressing: with min guard assist;sitting/lateral leans;sit to/from stand Pt Will Transfer to Toilet: with min guard assist;ambulating;regular height toilet Additional ADL Goal #1: pt will perform bed mobility with min A in prep for ADLs  Plan Discharge plan remains appropriate;Frequency remains appropriate    Co-evaluation                 AM-PAC OT "6 Clicks" Daily Activity     Outcome Measure   Help from another person eating meals?: None Help from another person taking care of personal grooming?: A Little Help from another person toileting, which includes using toliet, bedpan, or urinal?: A Little Help from another person bathing (including washing, rinsing, drying)?: A Lot Help from another person to put on and taking off regular upper body clothing?: A Little Help from another person to put on and taking off regular lower body clothing?: A Lot 6 Click Score: 17    End of Session Equipment Utilized During Treatment: Gait belt;Rolling walker (2 wheels)  OT Visit Diagnosis: Unsteadiness on feet (R26.81);Muscle weakness (generalized) (M62.81);Other abnormalities of gait and mobility (R26.89)   Activity Tolerance Patient tolerated  treatment well   Patient Left in chair;with call bell/phone within reach   Nurse Communication Mobility status        Time: 6962-9528 OT Time Calculation (min): 21 min  Charges: OT General Charges $OT Visit: 1 Visit OT Treatments $Self Care/Home Management : 8-22 mins  Alfonzo Beers, OTD, OTR/L Acute Rehab [phone]) [phone]   Mayer Masker 08/21/2022, 4:28 PM

## 2022-08-21 NOTE — Progress Notes (Signed)
[facility] PROGRESS NOTE  Anna [name]  UJW:[id number] DOB: 1936-09-17 DOA: 08/17/2022 PCP: Merryl Hacker, No  Brief Narrative: Anna [name] is an 86 y.o. female with medical history significant of chronic a flutter on Eliquis, mitral regurgitation/stenosis, HTN, and HLD who presented 10/21 with cough, shortness of breath, chest pain found to have pneumonia thought to have caused stress cardiomyopathy/NSTEMI.  Subjective: Breathing stable, still with persistent hacking troublesome cough. No wheezing this morning. No chest pain. Had thick bloody sputum shown to me this morning, none otherwise.   Objective: BP (!) 154/75 (BP Location: Left Wrist)   Pulse 66   Temp 97.7 F (36.5 C) (Oral)   Resp 20   Ht _0  (1.651 m)   Wt 83.2 kg   SpO2 98%   BMI 30.52 kg/m   Gen: Elderly female coughing but in no distress Pulm: Scattered expiratory rhonchi, nonlabored  CV: Irreg irreg, no murmur, no JVD, no edema GI: Soft, NT, ND, +BS  Neuro: Alert and oriented. No focal deficits. Ext: Warm, no deformities Skin: No rashes, lesions or ulcers on visualized skin  Assessment & Plan: Principal Problem:   NSTEMI (non-ST elevated myocardial infarction) (HCC) Active Problems:   Chronic atrial flutter (HCC)   Acute heart failure (HCC)   Elevated troponin  Acute hypoxic respiratory failure due to multifocal pneumonia: Based on CT infiltrates. Urine strep, legionella Ag's neg.  - Wean oxygen as tolerated.  - Continue ceftriaxone/doxycycline (10/21 >> 10/27) - Blood cultures NG4D, sputum culture reincubated. Leukocytosis relatively downtrending.  - Continue antitussives  Stress (Takotsubo) cardiomyopathy, NSTEMI, acute HFrEF: Troponin peaked at 5,091. LVEF 25% with WMA's. LHC 10/24 by Dr. Irish Lack revealed mild, nonobstructive CAD, elevated LVEDP, 11mHg.  - Newly added entresto, lasix per cardiology.  - Holding beta blocker with hx bradycardia. - Consider spiro/SGLT2i.  - Monitor I/O, daily  weights.  - Continue statin. Hold ASA since on DOAC.  AKI: SCr up 1.1 >> 1.5 > 1.78 with widening BUN:Cr. Also had contrast with cath as well as initiation of ARNI and lasix which are held today. U/S without hydronephrosis, UA bland. - Monitor in AM  Hyponatremia: Worsening.  - Consider work up if worsens further.  Chronic atrial flutter: Rate controlled without AV nodal agents - Continue anticoagulation, convert back to eliquis 2.586mpo BID (due to age and CrCl)  Goals of care: Pt wishes to remain full code during this hospitalization. This is at odds with her professed desire not to undergo CPR in the event of cardiorespiratory arrest. Palliative care met with patient and family formally 10/25. We will continue discussing this issue as we maintain full curative measures as above.     Abnormal imaging findings:  - Needs outpatient follow up for mediastinal and right hilar adenopathy, pulmonary nodules, enlarged L ovary, heterogenicity and fat stranding of R pericolic gutter peritoneum   NIDT2DM: HbA1c 5.8%.  - Continue SSI  HTN: - Medication changes as above, stopped norvasc.   RyPatrecia PourMD [facility] [email or web address] 08/21/2022, 5:24 PM

## 2022-08-21 NOTE — Plan of Care (Signed)
°  Problem: Health Behavior/Discharge Planning: °Goal: Ability to manage health-related needs will improve °Outcome: Progressing °  °Problem: Skin Integrity: °Goal: Risk for impaired skin integrity will decrease °Outcome: Progressing °  °

## 2022-08-21 NOTE — Care Management Important Message (Signed)
Important Message  Patient Details  Name: Anna [name] MRN: [id number] Date of Birth: January 13, 1936   Medicare Important Message Given:  Yes     Shelda Altes 08/21/2022, 7:45 AM

## 2022-08-21 NOTE — Progress Notes (Signed)
Physical Therapy Treatment Patient Details Name: Anna [name] MRN: [id number] DOB: [birth year] Today's Date: 08/21/2022   History of Present Illness 86 y.o. female presents to [facility] hospital on 08/17/2022 with new onset cough, SOB, nausea and chest pain. Chest x-ray suspicious for PNA. Troponins uptrending with concern for NSTEMI. PMH includes HLD, HTN, mitral regurgitation, OA, PAF.    PT Comments    Pt up in room working with OT at beginning of session. Pt agreeable to ambulation in hallway. Pt limited in safe mobility by 4/4 DoE and increased fatigue with mobility in presence of generalized weakness. Pt is currently minA for transfers and ambulation with RW. Pt and PT impressed with ambulation distance in hallway. Pt did require 1x seated rest break due to  4/4 DoE and muscle fatigue, VSS on RA. D/c plans remain appropriate at this time. PT will continue to follow acutely.   Recommendations for follow up therapy are one component of a multi-disciplinary discharge planning process, led by the attending physician.  Recommendations may be updated based on patient status, additional functional criteria and insurance authorization.  Follow Up Recommendations  Home health PT     Assistance Recommended at Discharge Frequent or constant Supervision/Assistance  Patient can return home with the following A little help with walking and/or transfers;A little help with bathing/dressing/bathroom;Assistance with cooking/housework;Assist for transportation;Help with stairs or ramp for entrance   Equipment Recommendations  None recommended by PT    Recommendations for Other Services OT consult     Precautions / Restrictions Precautions Precautions: Fall Restrictions Weight Bearing Restrictions: No     Mobility  Bed Mobility    Up with OT                Transfers Overall transfer level: Needs assistance Equipment used: Rolling walker (2 wheels) Transfers: Sit to/from Stand Sit to  Stand: Min assist, +2 physical assistance           General transfer comment: minAx2 for initial standing, after which pt able to come to standing with minA, vc for hand placement for power up, grossly ignored. able to self steady once up    Ambulation/Gait Ambulation/Gait assistance: Min assist Gait Distance (Feet): 30 Feet (+25) Assistive device: Rolling walker (2 wheels) Gait Pattern/deviations: Step-through pattern, Decreased step length - right, Decreased step length - left, Shuffle, Trunk flexed Gait velocity: WFL Gait velocity interpretation: 1.31 - 2.62 ft/sec, indicative of limited community ambulator   General Gait Details: min A for steadying, vc for proximity to RW and upright posture, however when asked if she utilizes Rollator at home pt reports she does, pt with one seated restbreak ambulating in hallway         Balance Overall balance assessment: Needs assistance Sitting-balance support: Feet supported, Bilateral upper extremity supported Sitting balance-Leahy Scale: Fair Sitting balance - Comments: can reach minimally outside BOS without LOB   Standing balance support: During functional activity, Reliant on assistive device for balance Standing balance-Leahy Scale: Poor Standing balance comment: reliant on external support                            Cognition Arousal/Alertness: Awake/alert Behavior During Therapy: Flat affect Overall Cognitive Status: Within Functional Limits for tasks assessed                                 General Comments: overall, WFL, good sense  of humor           General Comments General comments (skin integrity, edema, etc.): VSS on RA, 3/4 DoE      Pertinent Vitals/Pain Pain Assessment Pain Assessment: No/denies pain     PT Goals (current goals can now be found in the care plan section) Acute Rehab PT Goals Patient Stated Goal: stop coughing PT Goal Formulation: With patient Time For Goal  Achievement: 09/02/22 Potential to Achieve Goals: Fair Progress towards PT goals: Progressing toward goals    Frequency    Min 3X/week      PT Plan Current plan remains appropriate       AM-PAC PT "6 Clicks" Mobility   Outcome Measure  Help needed turning from your back to your side while in a flat bed without using bedrails?: A Little Help needed moving from lying on your back to sitting on the side of a flat bed without using bedrails?: A Lot Help needed moving to and from a bed to a chair (including a wheelchair)?: A Lot Help needed standing up from a chair using your arms (e.g., wheelchair or bedside chair)?: A Little Help needed to walk in hospital room?: A Little Help needed climbing 3-5 steps with a railing? : Total 6 Click Score: 14    End of Session Equipment Utilized During Treatment: Gait belt Activity Tolerance: Patient tolerated treatment well (coughing and related pain) Patient left: with call bell/phone within reach;in chair Nurse Communication: Mobility status PT Visit Diagnosis: Muscle weakness (generalized) (M62.81);Pain;Difficulty in walking, not elsewhere classified (R26.2)     Time: 0814-4818 PT Time Calculation (min) (ACUTE ONLY): 16 min  Charges:  $Gait Training: 8-22 mins                     Trammell Bowden B. Migdalia Dk PT, DPT Acute Rehabilitation Services Please use secure chat or  Call Office [phone]    [place] 08/21/2022, 11:46 AM

## 2022-08-21 NOTE — Progress Notes (Addendum)
Anna [name] for Heparin Indication: chest pain/ACS and hx atrial fibrillation (Apixaban on hold)  No Known Allergies  Patient Measurements: Height: 5\' 5"  (165.1 cm) Weight: 83.2 kg (183 lb 6.8 oz) IBW/kg (Calculated) : 57 Heparin Dosing Weight: 73 kg  Vital Signs: Temp: 97.5 F (36.4 C) (10/25 0808) Temp Source: Oral (10/25 0808) BP: 135/72 (10/25 0547) Pulse Rate: 64 (10/25 0758)  Labs: Recent Labs    08/19/22 0043 08/20/22 0132 08/21/22 0227  HGB 9.7* 9.2* 9.8*  HCT 29.8* 28.8* 29.8*  PLT 247 227 248  APTT 68* 83* 52*  HEPARINUNFRC >1.10* 1.03* 0.64  CREATININE 1.52* 1.57* 1.78*     Estimated Creatinine Clearance: 25.1 mL/min (A) (by C-G formula based on SCr of 1.78 mg/dL (H)).  Assessment: 76 yof with a history of HLD, HTN, mitral regurg, mitral stenosis, atrial fibrillation on apixaban. Patient is presenting with cough and CP. Heparin per pharmacy consult placed for chest pain/ACS. PTA Apixaban held, last dose 10/20 pm.  Using aPTTs for heparin monitoring while heparin levels are falsely elevated due to recent apixaban doses.    S/p [facility] 10/24 and heparin resumed.  Heparin level remains elevated and aPTT is sub-therapeutic at 52 sec (lab was drawn 2 hours early).  RN reports mild bloody sputum, likely from coughing.  Goal of Therapy:  Heparin level 0.3-0.7 units/ml aPTT 66-102 seconds Monitor platelets by anticoagulation protocol: Yes   Plan:  Increase heparin drip at 1000 units/hr Recheck aPTT in 8 hrs Daily heparin level, aPTT and CBC F/u with transitioning to Eliquis Monitor for bleeding  Armani Gawlik D. Mina Marble, PharmD, BCPS, [place] 08/21/2022, 9:32 AM  ==========================  Addendum: Transition to Eliquis 2.5mg  PO BID Pharmacy will sign off and follow peripherally.  Thank you for the consult!  Aayushi Solorzano D. Mina Marble, PharmD, BCPS, [place] 08/21/2022, 9:45 AM

## 2022-08-21 NOTE — Progress Notes (Signed)
Daily Progress Note   Patient Name: Anna [name]       Date: 08/21/2022 DOB: [birth year]  Age: 86 y.o. MRN#: [id number] Attending Physician: Patrecia Pour, MD Primary Care Physician: Pcp, No Admit Date: 08/17/2022  Reason for Consultation/Follow-up: Establishing goals of care  Patient Profile/HPI:  86 y.o. female  with past medical history of a flutter, mitral regurg/stenosis, HTN, HLD admitted on 08/17/2022 with shortness of breath. Workup reveals NSTEMI and she's been started on heparin drip. Going for cardiac cath today. She also has pneumonia and is being treated with antibiotics.  ECHO this admission shows EF 25%. No prior hospitalizations. Palliative medicine consulted for goals of care.   Subjective: Chart reviewed including labs, progress notes. Noted patient's cardiac cath did not indicate significant obstructive CAD. Patient being treated for Takutsubo cardiomyopathy, likely induced by her pneumonia.  Met with patient and her Granddaughter. Her daughter [name] was unable to attend.  Palliative medicine was reviewed- the differences between hospice and palliative was discussed.  We discussed her diagnosis- patient and family are pleased to hear that her heart failure is potentially reversible.  Arnika was able to ambulate in the hall a good distance with PT today, although she did become SOB and required a break. She is currently comfortable on room air, although has a bothersome cough.  We discussed Ka[place]'s values and goals of care. She enjoys good food- especially ribs and cole slaw.  Her GOC at this point is to return home so her daughter can cook for her.  She hopes to remain as independent as possible for as long as possible. She enjoys interacting with others.  She has a Living  Will documenting her wishes to not be kept alive artificially in the setting of a terminal illness- however, she is unsure where it is.  If she were unable to make her own decisions she would want to designate her daughter [name] as her decision maker for medical interventions. She is interested in completing a new document while hospitalized and agrees to [place] visit.  Code status was discussed. She was previously DNR, but changed it last night after a conversation with nursing.  She really wouldn't want to receive CPR in the event that her heart stopped and she stopped breathing and died- but wants to remain full code for  now while hospitalized and will consider changing it in the future.   Review of Systems  Constitutional:  Positive for malaise/fatigue.  Respiratory:  Positive for cough and sputum production.      Physical Exam Vitals and nursing note reviewed.  Constitutional:      Appearance: Normal appearance.  Cardiovascular:     Rate and Rhythm: Normal rate.  Pulmonary:     Effort: Pulmonary effort is normal.  Neurological:     General: No focal deficit present.     Mental Status: She is alert and oriented to person, place, and time.             Vital Signs: BP 90/72   Pulse 69   Temp 97.7 F (36.5 C) (Oral)   Resp 20   Ht 5' 5"  (1.651 m)   Wt 83.2 kg   SpO2 96%   BMI 30.52 kg/m  SpO2: SpO2: 96 % O2 Device: O2 Device: Room Air O2 Flow Rate: O2 Flow Rate (L/min): 2 L/min  Intake/output summary:  Intake/Output Summary (Last 24 hours) at 08/21/2022 1404 Last data filed at 08/21/2022 1235 Gross per 24 hour  Intake 390.07 ml  Output --  Net 390.07 ml   LBM: Last BM Date : 08/19/22 Baseline Weight: Weight: 77.6 kg Most recent weight: Weight: 83.2 kg       Palliative Assessment/Data: PPS: 60%      Patient Active Problem List   Diagnosis Date Noted   Elevated troponin    Acute heart failure ([place])    NSTEMI (non-ST elevated myocardial infarction) ([place])  08/17/2022   Mitral stenosis 05/07/2021   Recurrent falls 04/24/2021   History of vertebral fracture 04/24/2021   Lumbar radiculopathy 04/24/2021   Anemia due to stage 3a chronic kidney disease ([place]) 03/07/2020   Stage 3a chronic kidney disease ([place]) 03/07/2020   Mixed incontinence urge and stress 03/09/2018   Prediabetes 03/09/2018   OA (osteoarthritis) of knee 03/09/2018   Sinus bradycardia 12/18/2016   Estrogen deficiency 07/03/2016   Mitral regurgitation 03/01/2014   Chronic atrial flutter ([place]) 03/01/2014   Permanent atrial fibrillation ([place]) 12/24/2013   Essential hypertension, benign 12/24/2013   Heart murmur 12/24/2013   Hyperlipemia 12/24/2013    Palliative Care Assessment & Plan    Assessment/Recommendations/Plan  Continue full scope, full code Expect discharge home with home PT Chaplain consult for Advanced Directives/HCPOA    Code Status: Full code  Prognosis:  Unable to determine  Discharge Planning: Home with [place] was discussed with patient and family.   Thank you for allowing the Palliative Medicine Team to assist in the care of this patient.  Total time:  75 minutes  Greater than 50%  of this time was spent counseling and coordinating care related to the above assessment and plan.  Mariana Kaufman, AGNP-C Palliative Medicine   Please contact Palliative Medicine Team phone at [phone] for questions and concerns.

## 2022-08-21 NOTE — Progress Notes (Signed)
Heart Failure Nurse Navigator Progress Note  PCP: Pcp, No PCP-Cardiologist: [place] Admission Diagnosis: NSTEMI Admitted from: Home  Presentation:   Anna [name] presented with chest pain, cough, shortness of breath. CXR showed PNA vs mild pulmonary edema. CT of chest with cardiomegaly. BNP 697, Troponin peak at 5091. Plan for Left heart cath   Patient was educated on the sign and symptoms of heart failure, daily weights, when to call her doctor or go to the ED, Diet/ fluid restrictions ( patient reported that she can drink normally about 6 bottle s of water daily, and uses salt substitutes for her food. Continued education on taking all medications as prescribed, attending all medical appointments. A hospital follow up with HF TOC was scheduled for 09/04/2022 @ 2 pm.   ECHO/ LVEF: 25% HFrEF  Clinical Course:  Past Medical History:  Diagnosis Date   Hyperlipidemia    Hypertension    Mitral regurgitation    mild to moderate by echo 04/2022   Mitral stenosis    mild by echo 04/2022   Osteoarthritis    Overweight(278.02)    Permanent atrial fibrillation (HCC)      Social History   Socioeconomic History   Marital status: Divorced    Spouse name: Not on file   Number of children: 3   Years of education: Not on file   Highest education level: Not on file  Occupational History   Occupation: retired  Tobacco Use   Smoking status: Never   Smokeless tobacco: Never  Vaping Use   Vaping Use: Never used  Substance and Sexual Activity   Alcohol use: No   Drug use: No   Sexual activity: Not on file  Other Topics Concern   Not on file  Social History Narrative   Not on file   Social Determinants of Health   Financial Resource Strain: Low Risk  (08/21/2022)   Overall Financial Resource Strain (CARDIA)    Difficulty of Paying Living Expenses: Not very hard  Food Insecurity: No Food Insecurity (08/17/2022)   Hunger Vital Sign    Worried About Running Out of Food in the Last  Year: Never true    Ran Out of Food in the Last Year: Never true  Transportation Needs: No Transportation Needs (08/17/2022)   PRAPARE - Hydrologist (Medical): No    Lack of Transportation (Non-Medical): No  Physical Activity: Insufficiently Active (07/15/2021)   Exercise Vital Sign    Days of Exercise per Week: 5 days    Minutes of Exercise per Session: 10 min  Stress: Not on file  Social Connections: Not on file   Education Assessment and Provision:  Detailed education and instructions provided on heart failure disease management including the following:  Signs and symptoms of Heart Failure When to call the physician Importance of daily weights Low sodium diet Fluid restriction Medication management Anticipated future follow-up appointments  Patient education given on each of the above topics.  Patient acknowledges understanding via teach back method and acceptance of all instructions.  Education Materials:  "Living Better With Heart Failure" Booklet, HF zone tool, & Daily Weight Tracker Tool.  Patient has scale at home: Yes Patient has pill box at home: Yes    High Risk Criteria for Readmission and/or Poor Patient Outcomes: Heart failure hospital admissions (last 6 months): 0  No Show rate: 3 %  Difficult social situation: No Demonstrates medication adherence: Yes Primary Language: English Literacy level: Reading, writing and comprehension  Barriers of Care:   Diet/ fluid restrictions ( drinks at least 6 bottles of water daily)uses salt substitutes Daily weights Continued education on HF.   Considerations/Referrals:   Referral made to Heart Failure Pharmacist Stewardship: Yes Referral made to Heart Failure CSW/NCM TOC: No Referral made to Heart & Vascular TOC clinic: Yes, 09/04/2022 @ 2 pm.  Items for Follow-up on DC/TOC: Diet/ fluids ( drinks 6 bottles water daily, uses salt substitutes.  Continued Education on HF   [employer],  Copywriter, advertising, Lawyer Chat Only

## 2022-08-21 NOTE — Progress Notes (Addendum)
Rounding Note    Patient Name: Anna [name] Date of Encounter: 08/21/2022  [place] Cardiologist: Fransico Him, MD   Subjective   Had cath yesterday showing minimal luminal irregularities with no significant obstructive CAD - findings on echo and cath c/w Takotsubo cardiomyopathy.  Denies any chest pain and shortness of breath is improving with treatment of her pneumonia.  Inpatient Medications    Scheduled Meds:  arformoterol  15 mcg Nebulization BID   aspirin EC  81 mg Oral Daily   benzonatate  100 mg Oral BID   budesonide (PULMICORT) nebulizer solution  0.25 mg Nebulization BID   diclofenac Sodium  2 g Topical QID   docusate sodium  100 mg Oral Daily   ferrous sulfate  325 mg Oral Q breakfast   furosemide  20 mg Oral Daily   gabapentin  100 mg Oral TID   guaiFENesin  1,200 mg Oral BID   influenza vaccine adjuvanted  0.5 mL Intramuscular Tomorrow-1000   insulin aspart  0-15 Units Subcutaneous TID WC   methylPREDNISolone (SOLU-MEDROL) injection  40 mg Intravenous Daily   rosuvastatin  40 mg Oral Daily   sacubitril-valsartan  1 tablet Oral BID   sodium chloride flush  3 mL Intravenous Q12H   sodium chloride flush  3 mL Intravenous Q12H   Continuous Infusions:  sodium chloride     cefTRIAXone (ROCEPHIN)  IV 2 g (08/21/22 0850)   doxycycline (VIBRAMYCIN) IV 100 mg (08/20/22 2145)   heparin 900 Units/hr (08/20/22 2207)   PRN Meds: sodium chloride, acetaminophen, albuterol, cyclobenzaprine, HYDROcodone-acetaminophen, LORazepam, morphine injection, nitroGLYCERIN, ondansetron (ZOFRAN) IV, sodium chloride flush   Vital Signs    Vitals:   08/20/22 2017 08/21/22 0547 08/21/22 0758 08/21/22 0808  BP: (!) 117/56 135/72    Pulse: 62 68 64   Resp: 20 18    Temp: 97.8 F (36.6 C) 97.8 F (36.6 C)  (!) 97.5 F (36.4 C)  TempSrc: Oral Oral  Oral  SpO2: 95% 97% 96%   Weight:  83.2 kg    Height:        Intake/Output Summary (Last 24 hours) at 08/21/2022  1443 Last data filed at 08/21/2022 0809 Gross per 24 hour  Intake 270.07 ml  Output 350 ml  Net -79.93 ml       08/21/2022    5:47 AM 08/20/2022    4:37 AM 08/18/2022    3:41 AM  Last 3 Weights  Weight (lbs) 183 lb 6.8 oz 169 lb 8.5 oz 177 lb 4 oz  Weight (kg) 83.2 kg 76.9 kg 80.4 kg      Telemetry    Atypical  atrial flutter with controlled ventricular response- Personally Reviewed  ECG    No new EKG to review - Personally Reviewed  Physical Exam   GEN: Well nourished, well developed in no acute distress HEENT: Normal NECK: No JVD; No carotid bruits LYMPHATICS: No lymphadenopathy CARDIAC: Irregularly irregular, no murmurs, rubs, gallops RESPIRATORY: Scattered expiratory wheezes and rhonchi ABDOMEN: Soft, non-tender, non-distended MUSCULOSKELETAL:  No edema; No deformity  SKIN: Warm and dry NEUROLOGIC:  Alert and oriented x 3 PSYCHIATRIC:  Normal affect   Labs    High Sensitivity Troponin:   Recent Labs  Lab 08/17/22 1028 08/17/22 1440 08/18/22 0038 08/18/22 0620  TROPONINIHS 158* 2,067* 5,091* 4,331*      Chemistry Recent Labs  Lab 08/17/22 1028 08/18/22 0038 08/18/22 1540 08/19/22 0043 08/20/22 0132 08/21/22 0227  NA 136   < >  --  130* 128* 127*  K 4.1   < >  --  4.0 4.2 4.3  CL 99   < >  --  97* 97* 96*  CO2 25   < >  --  22 19* 19*  GLUCOSE 152*   < >  --  137* 198* 169*  BUN 13   < >  --  29* 32* 39*  CREATININE 0.89   < >  --  1.52* 1.57* 1.78*  CALCIUM 9.4   < >  --  8.5* 8.5* 8.8*  MG  --   --  1.6* 2.0  --  2.0  PROT 6.9  --   --  6.0*  --  6.2*  ALBUMIN 3.4*  --   --  2.8*  --  2.8*  AST 24  --   --  49*  --  55*  ALT 13  --   --  23  --  37  ALKPHOS 114  --   --  92  --  93  BILITOT 0.9  --   --  0.7  --  0.6  GFRNONAA >60   < >  --  34* 32* 28*  ANIONGAP 12   < >  --  11 12 12    < > = values in this interval not displayed.     Lipids  Recent Labs  Lab 08/18/22 0620  CHOL 69  TRIG 62  HDL 33*  LDLCALC 24  CHOLHDL 2.1      Hematology Recent Labs  Lab 08/19/22 0043 08/20/22 0132 08/21/22 0227  WBC 11.0* 15.1* 10.9*  RBC 3.39* 3.23* 3.41*  HGB 9.7* 9.2* 9.8*  HCT 29.8* 28.8* 29.8*  MCV 87.9 89.2 87.4  MCH 28.6 28.5 28.7  MCHC 32.6 31.9 32.9  RDW 13.6 13.6 13.8  PLT 247 227 248    Thyroid No results for input(s): "TSH", "FREET4" in the last 168 hours.  BNP Recent Labs  Lab 08/17/22 1440  BNP 697.2*     DDimer No results for input(s): "DDIMER" in the last 168 hours.   Radiology    US RENAL  Result Date: 08/20/2022 CLINICAL DATA:  Acute renal injury EXAM: RENAL / URINARY TRACT ULTRASOUND COMPLETE COMPARISON:  01/19/2020, CT from 08/17/2022 FINDINGS: Right Kidney: Renal measurements: 9.1 x 3.7 x 4.3 cm. = volume: 76 mL. 2.5 cm upper pole cyst is noted simple in nature. No follow-up is recommended. No mass lesion or hydronephrosis is noted. Left Kidney: Renal measurements: 9.5 x 4.4 x 4.3 cm. = volume: 95 mL. Echogenicity within normal limits. No mass or hydronephrosis visualized. Bladder: Not well visualized. Other: None. IMPRESSION: Simple right renal cyst stable in appearance from the prior exam. No acute abnormality noted. Electronically Signed   By: Inez Catalina M.D.   On: 08/20/2022 22:43   CARDIAC CATHETERIZATION  Result Date: [id number]   LV end diastolic pressure is mildly elevated.  LVEDP 20 mm Hg.   There is no aortic valve stenosis.   Mild, nonobstructive CAD. Likely Takotsubo cardiomyopathy based on the RWMA noted on echo.  Results conveyed to her granddaughter, [name].   DG CHEST PORT 1 VIEW  Result Date: 08/19/2022 CLINICAL DATA:  Shortness of breath. EXAM: PORTABLE CHEST 1 VIEW COMPARISON:  08/17/2022 FINDINGS: The cardio pericardial silhouette is enlarged. Bibasilar airspace disease again noted without substantial interval change. The visualized bony structures of the thorax are unremarkable. Telemetry leads overlie the chest. IMPRESSION: No substantial interval change.  Bibasilar airspace disease.  Electronically Signed   By: Kennith Center M.D.   On: 08/19/2022 16:06    Cardiac Studies   Cath 08/16/2022 Conclusion     LV end diastolic pressure is mildly elevated.  LVEDP 20 mm Hg.   There is no aortic valve stenosis.  Minimal luminal irregularities with no significant obstructive CAD.    Echocardiogram 08/18/22  1. Left ventricular ejection fraction, by estimation, is 25%. The left  ventricle has severely decreased function. The left ventricle demonstrates  regional wall motion abnormalities (see scoring diagram/findings for  description). Left ventricular  diastolic parameters are indeterminate.   2. Right ventricular systolic function is mildly reduced. The right  ventricular size is normal. There is mildly elevated pulmonary artery  systolic pressure.   3. Left atrial size was moderately dilated.   4. Right atrial size was moderately dilated.   5. The mitral valve is abnormal. Mild mitral valve regurgitation. No  evidence of mitral stenosis.   6. The tricuspid valve is abnormal. Tricuspid valve regurgitation is mild  to moderate.   7. The aortic valve has an indeterminant number of cusps. There is mild  calcification of the aortic valve. There is mild thickening of the aortic  valve. Aortic valve regurgitation is not visualized. No aortic stenosis is  present.   8. The inferior vena cava is dilated in size with <50% respiratory  variability, suggesting right atrial pressure of 15 mmHg.   Patient Profile     86 y.o. female with history of atrial flutter on Eliquis, hypertension, hyperlipidemia presented with chest pain, shortness of breath, nausea.  Assessment & Plan    NSTEMI  - Patient presented complaining of chest pain. hsTn 158>>2067>>5091>>4331. EKG with subtle ST elevations in V2-5 with new TWI from prior EKG.   - Cardiac cath yesterday showed minimal luminal irregularities with no obstructive CAD - The echo and cath consistent with  Takotsubo cardiomyopathy with stress MI - Denies any chest pain - Continue high-dose statin therapy - Stop aspirin since she does not have any significant plaque on cath and she is on DOAC for a flutter - No beta-blocker due to active wheezing and history of bradycardia  Acute systolic heart failure  Ischemic cardiomyopathy  - Echo from 04/2022 with EF 55-60% without regional wall motion abnormalities. Echo this admission with EF 25%, regional wall motion abnormalities, mildly reduced RV systolic function  - Heart cath yesterday with minimal luminal irregularities findings at cath and echo consistent with Takotsubo cardiomyopathy - CXR with possible NPA vs edema. BNP elevated to 677 - creatinine up to 1.52 after a few days of IV lasix. Home irbesartan held. Not on BB due to history of bradycardia  -Started on Entresto 24-26 mg twice daily and Lasix 20 mg daily yesterday but serum creatinine has bumped post cath to 1.78 from 1.57 so we will hold both of these medicines today>> will restart once serum creatinine improved  Chronic atrial flutter  - HR adequately controlled and not requiring any AVN blocking agents - stop IV heparin and restart Eliquis 2.5 mg twice daily (dosed for age greater than 80 and serum creatinine greater than 1.5)  Multifocal PNA  - On antibiotics per primary     I have spent a total of 35 minutes with patient reviewing 2D echo , telemetry, EKGs, labs and examining patient as well as establishing an assessment and plan that was discussed with the patient.  > 50% of time was spent in direct patient care.  For questions or updates, please contact [place] Please consult [email or web address] for contact info under        Signed, Fransico Him, MD  08/21/2022, 9:36 AM

## 2022-08-22 DIAGNOSIS — I5021 Acute systolic (congestive) heart failure: Secondary | ICD-10-CM | POA: Diagnosis not present

## 2022-08-22 DIAGNOSIS — J189 Pneumonia, unspecified organism: Secondary | ICD-10-CM | POA: Diagnosis not present

## 2022-08-22 DIAGNOSIS — R7989 Other specified abnormal findings of blood chemistry: Secondary | ICD-10-CM | POA: Diagnosis not present

## 2022-08-22 DIAGNOSIS — Z7189 Other specified counseling: Secondary | ICD-10-CM | POA: Diagnosis not present

## 2022-08-22 DIAGNOSIS — I5181 Takotsubo syndrome: Secondary | ICD-10-CM | POA: Diagnosis not present

## 2022-08-22 DIAGNOSIS — I214 Non-ST elevation (NSTEMI) myocardial infarction: Secondary | ICD-10-CM | POA: Diagnosis not present

## 2022-08-22 DIAGNOSIS — I4892 Unspecified atrial flutter: Secondary | ICD-10-CM | POA: Diagnosis not present

## 2022-08-22 LAB — BASIC METABOLIC PANEL
Anion gap: 12 (ref 5–15)
Anion gap: 15 (ref 5–15)
BUN: 43 mg/dL — ABNORMAL HIGH (ref 8–23)
BUN: 42 mg/dL — ABNORMAL HIGH (ref 8–23)
CO2: 18 mmol/L — ABNORMAL LOW (ref 22–32)
CO2: 19 mmol/L — ABNORMAL LOW (ref 22–32)
Calcium: 8.5 mg/dL — ABNORMAL LOW (ref 8.9–10.3)
Calcium: 8.8 mg/dL — ABNORMAL LOW (ref 8.9–10.3)
Chloride: 91 mmol/L — ABNORMAL LOW (ref 98–111)
Chloride: 94 mmol/L — ABNORMAL LOW (ref 98–111)
Creatinine, Ser: 1.7 mg/dL — ABNORMAL HIGH (ref 0.44–1.00)
Creatinine, Ser: 1.72 mg/dL — ABNORMAL HIGH (ref 0.44–1.00)
GFR, Estimated: 29 mL/min — ABNORMAL LOW (ref >60)
GFR, Estimated: 29 mL/min — ABNORMAL LOW (ref >60)
Glucose, Bld: 228 mg/dL — ABNORMAL HIGH (ref 70–99)
Glucose, Bld: 209 mg/dL — ABNORMAL HIGH (ref 70–99)
Potassium: 4 mmol/L (ref 3.5–5.1)
Potassium: 4.2 mmol/L (ref 3.5–5.1)
Sodium: 121 mmol/L — ABNORMAL LOW (ref 135–145)
Sodium: 128 mmol/L — ABNORMAL LOW (ref 135–145)

## 2022-08-22 LAB — CULTURE, BLOOD (ROUTINE X 2)
Culture: NO GROWTH
Culture: NO GROWTH
Special Requests: ADEQUATE

## 2022-08-22 LAB — CBC
HCT: 30.1 % — ABNORMAL LOW (ref 36.0–46.0)
Hemoglobin: 10.3 g/dL — ABNORMAL LOW (ref 12.0–15.0)
MCH: 28.7 pg (ref 26.0–34.0)
MCHC: 34.2 g/dL (ref 30.0–36.0)
MCV: 83.8 fL (ref 80.0–100.0)
Platelets: 293 10*3/uL (ref 150–400)
RBC: 3.59 MIL/uL — ABNORMAL LOW (ref 3.87–5.11)
RDW: 13.6 % (ref 11.5–15.5)
WBC: 10.4 10*3/uL (ref 4.0–10.5)
nRBC: 0.5 % — ABNORMAL HIGH (ref 0.0–0.2)

## 2022-08-22 LAB — OSMOLALITY: Osmolality: 284 mOsm/kg (ref 275–295)

## 2022-08-22 LAB — SODIUM, URINE, RANDOM: Sodium, Ur: 18 mmol/L

## 2022-08-22 LAB — GLUCOSE, CAPILLARY
Glucose-Capillary: 248 mg/dL — ABNORMAL HIGH (ref 70–99)
Glucose-Capillary: 192 mg/dL — ABNORMAL HIGH (ref 70–99)
Glucose-Capillary: 218 mg/dL — ABNORMAL HIGH (ref 70–99)
Glucose-Capillary: 152 mg/dL — ABNORMAL HIGH (ref 70–99)

## 2022-08-22 LAB — OSMOLALITY, URINE: Osmolality, Ur: 296 mOsm/kg — ABNORMAL LOW (ref 300–900)

## 2022-08-22 MED ORDER — DAPAGLIFLOZIN PROPANEDIOL 10 MG PO TABS
10.0000 mg | ORAL_TABLET | Freq: Every day | ORAL | Status: DC
  Administered 2022-08-23 – 2022-08-24 (×2): 10 mg via ORAL
  Filled 2022-08-22 (×2): qty 1

## 2022-08-22 MED ORDER — INSULIN ASPART 100 UNIT/ML IJ SOLN
0.0000 [IU] | Freq: Three times a day (TID) | INTRAMUSCULAR | Status: DC
  Administered 2022-08-22: 7 [IU] via SUBCUTANEOUS
  Administered 2022-08-23: 3 [IU] via SUBCUTANEOUS

## 2022-08-22 MED ORDER — INSULIN ASPART 100 UNIT/ML IJ SOLN: 0.0000 [IU] | Freq: Every day | INTRAMUSCULAR | Status: DC

## 2022-08-22 MED ORDER — MENTHOL 3 MG MT LOZG: 1.0000 | LOZENGE | OROMUCOSAL | Status: DC | PRN

## 2022-08-22 NOTE — Progress Notes (Addendum)
[facility] PROGRESS NOTE  Anna [name]  MMN:[id number] DOB: 1935-11-22 DOA: 08/17/2022 PCP: Merryl Hacker, No  Brief Narrative: Anna [name] is an 86 y.o. female with medical history significant of chronic a flutter on Eliquis, mitral regurgitation/stenosis, HTN, and HLD who presented 10/21 with cough, shortness of breath, chest pain found to have pneumonia thought to have caused stress cardiomyopathy/NSTEMI causing acute HFrEF. diuresis limited by AKI. Hospitalization also complicated by hyponatremia.  Subjective: Cough is dissipated at times though when she gets started, still severe. No chest pain or dyspnea. Says she's drinking "like a beer drinker" to keep her throat from feeling so dry. Urinating subjectively very often. Has always been restricting salt intake.  Objective: BP (!) 155/70 (BP Location: Right Wrist)   Pulse 60   Temp 97.8 F (36.6 C) (Oral)   Resp 19   Ht _0  (1.651 m)   Wt 85 kg   SpO2 99%   BMI 31.18 kg/m   Gen: Elderly female in no distress Pulm: Nonlabored on room air with expiratory rhonchi, R > L. No crackles. CV: Irreg without murmur, rub, or gallop. No JVD, 1+ pitting edema at dependent thigh. GI: Abdomen soft, non-tender, non-distended, with normoactive bowel sounds.  Ext: Warm, no deformities Skin: No rashes, lesions or ulcers on visualized skin. Neuro: Alert and oriented. No focal neurological deficits. Psych: Judgement and insight appear fair. Mood euthymic & affect congruent. Behavior is appropriate.    Assessment & Plan: Acute hypoxic respiratory failure due to multifocal pneumonia: Based on CT infiltrates with RLL predominance on my personal review of images today. Urine strep, legionella Ag's neg.  - Weaned to room air. - Continue ceftriaxone/doxycycline (10/21 >> 10/27) - Wean steroids as able, still some wheezing, though improving overall.  - Blood cultures NG4D, sputum culture normal flora. Leukocytosis has resolved. - Discussed with  patient the need for repeat CT chest in 3-6 months. - Continue antitussives, add lozenges in effort to reduce fluid intake  Stress (Takotsubo) cardiomyopathy, NSTEMI, acute HFrEF: Troponin peaked at 5,091. LVEF 25% with WMA's. LHC 10/24 by Dr. Irish Lack revealed mild, nonobstructive CAD, elevated LVEDP, 35mHg.  - Newly added entresto, lasix has been held w/AKI. GDMT initiation per cardiology. - Holding beta blocker with hx bradycardia. - Consider spiro/SGLT2i once renal function improved. - Monitor I/O, daily weights.  - Continue statin. Hold ASA since on DOAC.  AKI: SCr up 1.1 >> 1.5 > 1.78 > 1.70 with widening BUN:Cr. Also had contrast with cath as well as initiation of ARNI and lasix on 10/24. U/S without hydronephrosis, UA bland. - Stabilizing post cath/diuretic.   Hyponatremia: Worsening again today. Overall is down 133 >>> 121. Volume status remains some up.  - Check Serum Osm, urine osm and Na. Add fluid restriction. There is a component of relative oral fluid > solute intake.  - Appreciate nephrology opinion.  - Consider work up if worsens further.  Chronic atrial flutter: Rate controlled without AV nodal agents - Continue anticoagulation, converted back to eliquis 2.514mpo BID (due to age and CrCl)  Goals of care: Pt wishes to remain full code during this hospitalization. This is at odds with her professed desire not to undergo CPR in the event of cardiorespiratory arrest. Palliative care met with patient and family formally 10/25. We will continue discussing this issue as we maintain full curative measures as above.     Abnormal imaging findings:  - Needs outpatient follow up for mediastinal and right hilar adenopathy, pulmonary nodules with  CT chest in 3-6 months - Needs outpatient pelvic U/S for enlarged L ovary. D/w pt.  - CT also noted heterogenicity and fat stranding of R pericolic gutter peritoneum for which "attention at follow up" is recommended.  NIDT2DM: HbA1c 5.8%.   - Continue SSI, augment to resistant scale and add HS coverage due to steroid-induced hyperglycemia.   HTN: - Medication changes as above, stopped norvasc.   Patrecia Pour, MD [facility] [email or web address] 08/22/2022, 8:33 AM

## 2022-08-22 NOTE — Plan of Care (Signed)
  Problem: Coping: Goal: Ability to adjust to condition or change in health will improve 08/22/2022 2108 by Tristan Schroeder, RN Outcome: Progressing 08/22/2022 2108 by Tristan Schroeder, RN Outcome: Progressing

## 2022-08-22 NOTE — Progress Notes (Signed)
08/22/22 1210  Clinical Encounter Type  Visited With Patient  Visit Type Initial (Advance Directive)  Referral From Nurse Beverely Pace, NP)  Consult/Referral To Chaplain Melvenia Beam)  Recommendations Advance Directive Education   Chaplain responded to page for spiritual care consultation requesting assistance for Advance Directive preparation with Anna [name]. Chaplain met with Anna [name], at patient's bedside.   Anna [name] stated that she does NOT have a court appointed [place].  Anna [name] stated that she is NOT Married.   Anna [name] indicated that she intends to list her Daughter: Sylvester Harder, [phone] as her [place].   Anna [name] indicated that she intends to list her Granddaughter: [name],  [phone] as her [place].  Chaplain provided the Advance Directive packet as well as education on Advance Directives-documents an individual completes to communicate their health care directions in advance of a time when they may need them. Chaplain informed Anna [name] the documents which may be completed here in the hospital are the Living Will and [place].   Chaplain informed Anna [name] that the [place] is a legal document in which an individual names another person, as their [place], to communicate her health care decisions, when she is not able to communicate them for herself. The Health Care Agent's function can be temporary or permanent depending on her ability to make and communicate those decisions independently.   Chaplain informed Anna [name] in the absence of a [place], the state of [place] directs health care providers to look to the following individuals in the order listed: legal guardian; an attorney-in-fact under a general power of attorney (POA) if that POA  includes the right to make health care decisions; person's spouse; a 62 of her adult children; a 88 of adult brothers and sisters; or an individual who has an established relationship with you, who is acting in good faith and who can convey your wishes.  If none of these persons are available or willing to make medical decisions on a patient's behalf, the law allows the patient's doctor to make decisions for them as long as another doctor agrees with those decisions.  Chaplain also informed the patient that the Health Care agent has no decision-making authority over any affairs other than those related to her medical care.   The chaplain further educated Anna [name] that a Living Will is a legal document that allows her desires not to receive life-prolonging measures in the event that she has a condition that is incurable and will result in her death in a short period of time; they are unconscious, and doctors are confident that she will not regain consciousness; and/or she has advanced dementia or other substantial and irreversible loss of mental function.   The chaplain informed Anna [name] that life-prolonging measures are medical treatments that would only serve to postpone death, including breathing machines, kidney dialysis, antibiotics, artificial nutrition and hydration (tube feeding), and similar forms of treatment and that if an individual is able to express their wishes, they may also make them known without the use of a Living Will, but in the event that she is not able to express her wishes, a Living Will allows medical providers and the her family and friends to ensure that they are not making decisions on  her behalf, but rather serving as her voice to convey decisions that she has already made.   Anna [name] is aware that the decision to create an advance directive is hers alone and she may choose not to complete the documents or may choose to complete one portion or both.  Anna [name] was  informed that she can revoke the documents at any time by striking through them and writing void or by completing new documents, but that it is also advisable that the individual verbally notify interested parties that her wishes have changed.   Anna [name] is also aware that the document must be signed in the presence of a notary public and two witnesses and that this may be done while the patient is still admitted to the hospital or after discharge in the community. If they decide to complete Advance Directives after being discharged from the hospital, they have been advised to notify all interested parties and to provide those documents to their physicians and loved ones in addition to bringing them to the hospital in the event of another hospitalization.   The chaplain informed the Anna [name] that if she desires to proceed with completing the Advance Directive Documentation while she is still admitted, notary services are typically available at [facility] between the hours of 1:00 and 3:30 Monday-Thursday.   When the patient is ready to have these documents completed, the patient should request that their nurse place a spiritual care consult and indicate that the patient is ready to have their advance directives notarized so that arrangements for witnesses and notary public can be made.  If there is an immediate need to have notarized please page the Chaplain.   Please page spiritual care if the patient desires further education or has questions.   [address] [place], [place] [phone].

## 2022-08-22 NOTE — Progress Notes (Signed)
   Heart Failure Stewardship Pharmacist Progress Note   PCP: Pcp, No PCP-Cardiologist: Fransico Him, MD    HPI:  86 yo F with PMH of aflutter, HTN, and HLD.  Presented to the ED on 10/21 with chest pain, cough, and shortness of breath. CXR with suspicions for PNA vs mild pulmonary edema. Chest CT with cardiomegaly. Troponin elevated and peaked to 5091. Admitted for NSTEMI. An ECHO was done on 10/22 and showed LVEF 25%, regional wall motion abnormalities, RV mildly reduced, mild MR, and mild-moderate TR. Prior ECHO in July 2023 with LVEF 55-60%  LHC on 10/24 showed mild nonobstructive CAD, consistent with Takotsubo.  Reports improvement in breathing but still is having productive cough.   Current HF Medications: None  Prior to admission HF Medications: Diuretic: furosemide 10 mg daily PRN ACE/ARB/ARNI: olmesartan 20 mg daily  Pertinent Lab Values: Serum creatinine 1.78>1.70, BUN 43, Potassium 4.0, Sodium 121, BNP 697.2, Magnesium 2.0, A1c 5.8   Vital Signs: Weight: 187 lbs (admission weight: 177 lbs) Blood pressure: 140/60s  Heart rate: 50-70s  I/O: -no output documented yesterday; net 156mL  Medication Assistance / Insurance Benefits Check: Does the patient have prescription insurance?  Yes Type of insurance plan: [place] Medicaid  Outpatient Pharmacy:  Prior to admission outpatient [facility] Is the patient willing to use [place] at discharge? Yes Is the patient willing to transition their outpatient pharmacy to utilize a [facility] outpatient pharmacy?   Pending    Assessment: 1. Acute systolic CHF (LVEF 35%), due to Takotsubo CM. LHC with nonobstructive CAD. NYHA class II symptoms. - Off IV lasix with AKI. Improved slightly today. Consider resuming tomorrow if SCr stable. Strict I/Os and daily weights. Keep K>4 and Mag>2. - Not on BB due to history of bradycardia. - Holding Entresto with AKI. Consider resuming tomorrow if SCr stable. - Consider starting  spironolactone and SGLT2i likely at follow up to allow for resuming other GDMT post AKI.   Plan: 1) Medication changes recommended at this time: - Restart lasix and Entresto tomorrow if renal function continues to improve  2) Patient assistance: - Has [place] Medicaid  3)  Education  - Patient has been educated on potential additions to HF medication regimen - Patient verbalizes understanding that over the next few months, these medication doses may change and more medications may be added to optimize HF regimen - Patient has been educated on basic disease state pathophysiology and goals of therapy   Kerby Nora, PharmD, BCPS Heart Failure Cytogeneticist Phone [phone]

## 2022-08-22 NOTE — Consult Note (Signed)
Nephrology Consult   Requesting provider: Vance Gather Service requesting consult: Hospitalist Reason for consult: AKI and Hyponatremia    Assessment/Recommendations: Anna [name] is a/an 86 y.o. female with a past medical history notable for chronic a flutter on Eliquis, mitral regurtigation/stenosis, HTN, CKD 3a and HLD who presented 10/21 with cough, shortness of breath, chest pain and found to have pneumonia which is thought to have caused stress cardiomyopathy/NSTEMI causing acute HFrEF. Hospitalization has been complicated by AKI and hyponatremia.    Non-Oliguric AKI on CKD 3a:  Suspect cardiorenal syndrome in setting of acute HFrEF. Likely worsened by contrast during cardiac cath. Renal U/S completed 10/24 to r/o structural cause. Scr 1.1 >> 1.5 >> 1.78. UA bland.  SCr stable today at 1.70. Crt seems to be plateauing. She remains overloaded. -AKI will hopefully improve as CHF stabilizes and CIN resolved -It is possible Crt make increase further as volume is optimized but this would be acceptable -Continue to monitor daily Cr -Dose meds for GFR<15 -Monitor Daily I/Os, Daily weight  -Maintain MAP>65 for optimal renal perfusion.  -Avoid further nephrotoxins including NSAIDS, Morphine. Unless absolutely necessary, avoid CT with contrast and/or MRI with gadolinium.    -Currently no acute indication for HD  Hypervolemic Hyponatremia 2/2 Acute HFrEF Suspect secondary to volume overload. Some false hyponatremia related to hyperglycemia so have to correct for the glucose. Differential includes SIADH however clinical picture points towards acute heart failure as cause. Wt up to 85 kg from low of 76.9 kg which supports volume overload as cause.  Na 133 >> 121. Corrected Na 123. Urine Na 18 Serum urine pending Serum Osm 284 Currently asymptomatic. Pt reports increased fluid intake yesterday with limited food. Appears hypervolemic on exam.   -Correct Na for glucose. History of DM and on  methylprednisolone, risk for steroid induced hyperglycemia  -Monitor Na with Q6H labs  -Start Farxiga 10mg  for management of volume, HF, and CKD. There is also accumulating data for the use of SGLT2is in the setting of acute heart failure particularly in its relation to improving hyponatremia. Data even exists for the use of SGLT2is in hyponatremia without heart failure -Will consider adding Lasix tomorrow if no improvement in volume status -Fluid restriction -Strict I/O, Daily weight   [name], Shchekochikhin D, [employer] D. Impact of dapagliflozin treatment on serum sodium concentrations in acute heart failure. Cardiorenal Med. 2023 Feb 20. doi: 10.1159/000529614. Epub ahead of print. PMID: [id number].  Acute hypoxic respiratory failure due to multifocal pneumonia: Based on CT infiltrates with RLL predominance. Urine strep, legionella Ag's neg. On doxy/ceftriaxone until 10/27. Antitussives and lozenges to aid with symptoms in setting of reduced fluid intake.   Stress (Takotsubo) cardiomyopathy, NSTEMI, acute HFrEF: Troponin peaked at 5,091. LVEF 25% with WMA's. LHC 10/24 by Dr. Irish Lack revealed mild, nonobstructive CAD, elevated LVEDP 64mmHg.  -Newly added entresto, lasix held due to AKI.  -Holding BB with hx of bradycardia -GDMT per cardiology  Chronic atrial flutter: Rate controlled without AV nodal agents -Continue anticoagulation, converted back to eliquis 2.5mg  po BID  NIDT2DM -Per primary team - Continue SSI, augment to resistant scale add HS coverage due to steroid-induced hyperglycemia    Recommendations conveyed to primary service.    Cathleen Fears, [facility] PA-S2  Independently reviewed and edited by Reesa Chew, MD    _____________________________________________________________________________________   History of Present Illness: Anna [name] is a/an 86 y.o. female with a past medical history of chronic  a  flutter on Eliquis, mitral regurtigation/stenosis, HTN, CKD 3a, and HLD who presented 10/21 with cough, shortness of breath, chest pain and found to have pneumonia which is thought to have caused stress cardiomyopathy/NSTEMI causing acute HFrEF. AKI was noted 10/23 and thought to be in setting of lasix. Patient underwent cardiac cath 10/24 which showed mildly elevated LVEDP and no obstructive CAD. AKI with worsening Cr post cardiac cath. Hyponatremia noted 10/25 with further worsening 10/26. Nephrology consulted for AKI and hyponatremia management.   Patient seen resting comfortably in bed. Has intermittent cough and her throat feels dry. Reports she has been drinking more fluid than usual due to her dry throat. Did not eat much yesterday due to feeling full from fluid intake. Feels weak and tired today but reports she did not fall asleep until 3am and woke up at 6am. No other complaints. Denies HA, dizziness, CP, SOB, N/V, muscle cramps or altered taste. Notably he weight has increased by 8kg in the past 3 days.  Patient does have history of chronic kidney disease but is unsure of stage. Reports she sees a nephrologist once a year.   Notable labs include Na 121, K 4.0, BUN 43, Cr 1.70, Hgb 10.3.    Medications:  Current Facility-Administered Medications  Medication Dose Route Frequency Provider Last Rate Last Admin   0.9 %  sodium chloride infusion  250 mL Intravenous PRN Jettie Booze, MD 10 mL/hr at 08/21/22 2155 250 mL at 08/21/22 2155   acetaminophen (TYLENOL) tablet 650 mg  650 mg Oral Q4H PRN Jettie Booze, MD       albuterol (PROVENTIL) (2.5 MG/3ML) 0.083% nebulizer solution 2.5 mg  2.5 mg Nebulization Q2H PRN Jettie Booze, MD       apixaban Arne Cleveland) tablet 2.5 mg  2.5 mg Oral BID Dang, Thuy D, RPH   2.5 mg at 08/22/22 0907   arformoterol (BROVANA) nebulizer solution 15 mcg  15 mcg Nebulization BID Jettie Booze, MD   15 mcg at 08/22/22 0850   benzonatate  (TESSALON) capsule 100 mg  100 mg Oral BID Jettie Booze, MD   100 mg at 08/22/22 0908   budesonide (PULMICORT) nebulizer solution 0.25 mg  0.25 mg Nebulization BID Larae Grooms S, MD   0.25 mg at 08/22/22 0850   cefTRIAXone (ROCEPHIN) 2 g in sodium chloride 0.9 % 100 mL IVPB  2 g Intravenous Q24H Vance Gather B, MD 200 mL/hr at 08/22/22 0909 2 g at 08/22/22 0909   cyclobenzaprine (FLEXERIL) tablet 5 mg  5 mg Oral BID PRN Jettie Booze, MD       [START ON 08/23/2022] dapagliflozin propanediol (FARXIGA) tablet 10 mg  10 mg Oral Daily Reesa Chew, MD       diclofenac Sodium (VOLTAREN) 1 % topical gel 2 g  2 g Topical QID Larae Grooms S, MD   2 g at 08/22/22 0908   docusate sodium (COLACE) capsule 100 mg  100 mg Oral Daily Jettie Booze, MD   100 mg at 08/21/22 2041   doxycycline (VIBRAMYCIN) 100 mg in sodium chloride 0.9 % 250 mL IVPB  100 mg Intravenous BID Vance Gather B, MD 125 mL/hr at 08/22/22 1027 100 mg at 08/22/22 1027   ferrous sulfate tablet 325 mg  325 mg Oral Q breakfast Jettie Booze, MD   325 mg at 08/22/22 [id number]   gabapentin (NEURONTIN) capsule 100 mg  100 mg Oral TID Jettie Booze, MD   100  mg at 08/22/22 0907   guaiFENesin (MUCINEX) 12 hr tablet 1,200 mg  1,200 mg Oral BID Jettie Booze, MD   1,200 mg at 08/22/22 [id number]   HYDROcodone-acetaminophen (NORCO/VICODIN) 5-325 MG per tablet 1 tablet  1 tablet Oral Q6H PRN Jettie Booze, MD   1 tablet at 08/17/22 2200   influenza vaccine adjuvanted (FLUAD) injection 0.5 mL  0.5 mL Intramuscular Tomorrow-1000 Wynetta Fines T, MD       insulin aspart (novoLOG) injection 0-20 Units  0-20 Units Subcutaneous TID WC Patrecia Pour, MD       insulin aspart (novoLOG) injection 0-5 Units  0-5 Units Subcutaneous QHS Patrecia Pour, MD       LORazepam (ATIVAN) injection 0.25 mg  0.25 mg Intravenous Q6H PRN Larae Grooms S, MD   0.25 mg at 08/18/22 1022   menthol-cetylpyridinium (CEPACOL)  lozenge 3 mg  1 lozenge Oral PRN Patrecia Pour, MD       methylPREDNISolone sodium succinate (SOLU-MEDROL) 40 mg/mL injection 40 mg  40 mg Intravenous Daily Jettie Booze, MD   40 mg at 08/22/22 0908   morphine (PF) 2 MG/ML injection 0.5 mg  0.5 mg Intravenous Q2H PRN Jettie Booze, MD   0.5 mg at 08/19/22 2112   nitroGLYCERIN (NITROSTAT) SL tablet 0.4 mg  0.4 mg Sublingual Q5 Min x 3 PRN Jettie Booze, MD       ondansetron [facility]) injection 4 mg  4 mg Intravenous Q6H PRN Jettie Booze, MD       rosuvastatin (CRESTOR) tablet 40 mg  40 mg Oral Daily Jettie Booze, MD   40 mg at 08/22/22 [id number]   sodium chloride flush (NS) 0.9 % injection 3 mL  3 mL Intravenous Q12H Jettie Booze, MD   3 mL at 08/22/22 0909   sodium chloride flush (NS) 0.9 % injection 3 mL  3 mL Intravenous Q12H Jettie Booze, MD   3 mL at 08/22/22 0908   sodium chloride flush (NS) 0.9 % injection 3 mL  3 mL Intravenous PRN Jettie Booze, MD         ALLERGIES Patient has no known allergies.  MEDICAL HISTORY Past Medical History:  Diagnosis Date   Hyperlipidemia    Hypertension    Mitral regurgitation    mild to moderate by echo 04/2022   Mitral stenosis    mild by echo 04/2022   Osteoarthritis    Overweight(278.02)    Permanent atrial fibrillation (HCC)      SOCIAL HISTORY Social History   Socioeconomic History   Marital status: Divorced    Spouse name: Not on file   Number of children: 3   Years of education: Not on file   Highest education level: Not on file  Occupational History   Occupation: retired  Tobacco Use   Smoking status: Never   Smokeless tobacco: Never  Vaping Use   Vaping Use: Never used  Substance and Sexual Activity   Alcohol use: No   Drug use: No   Sexual activity: Not on file  Other Topics Concern   Not on file  Social History Narrative   Not on file   Social Determinants of Health   Financial Resource Strain: Low Risk   (08/21/2022)   Overall Financial Resource Strain (CARDIA)    Difficulty of Paying Living Expenses: Not very hard  Food Insecurity: No Food Insecurity (08/17/2022)   Hunger Vital Sign    Worried About Running  Out of Food in the Last Year: Never true    Ran Out of Food in the Last Year: Never true  Transportation Needs: No Transportation Needs (08/17/2022)   PRAPARE - Hydrologist (Medical): No    Lack of Transportation (Non-Medical): No  Physical Activity: Insufficiently Active (07/15/2021)   Exercise Vital Sign    Days of Exercise per Week: 5 days    Minutes of Exercise per Session: 10 min  Stress: Not on file  Social Connections: Not on file  Intimate Partner Violence: Not At Risk (08/17/2022)   Humiliation, Afraid, Rape, and Kick questionnaire    Fear of Current or Ex-Partner: No    Emotionally Abused: No    Physically Abused: No    Sexually Abused: No     FAMILY HISTORY Family History  Problem Relation Age of Onset   Alzheimer's disease Mother      Review of Systems: Otherwise as per HPI, all other systems reviewed and negative  Physical Exam: Vitals:   08/22/22 0850 08/22/22 0900  BP:  (!) 143/65  Pulse:    Resp:    Temp:    SpO2: 98%    Total I/O In: 300 [P.O.:300] Out: 350 [Urine:350]  Intake/Output Summary (Last 24 hours) at 08/22/2022 1523 Last data filed at 08/22/2022 1104 Gross per 24 hour  Intake 1637 ml  Output 900 ml  Net 737 ml   General: elderly female in no acute distress CV: Irregularly irregular, no murmurs, no gallops, no rubs Lungs: Scattered expiratory rhonchi, normal work of breathing, no basilar crackles Abd: soft, non-tender, non-distended Ext: Warm, 1+ LE edema  Skin: no visible lesions or rashes on visualized skin Psych: alert, engaged, appropriate mood and affect Neuro: normal speech, no gross focal deficits   Test Results Reviewed Lab Results  Component Value Date   NA 121 (L) 08/22/2022   K 4.0  08/22/2022   CL 91 (L) 08/22/2022   CO2 18 (L) 08/22/2022   BUN 43 (H) 08/22/2022   CREATININE 1.70 (H) 08/22/2022   GFR 54.53 (L) 06/22/2015   CALCIUM 8.5 (L) 08/22/2022   ALBUMIN 2.8 (L) 08/21/2022   PHOS 4.6 08/21/2022     I have reviewed all relevant outside healthcare records related to the patient's kidney injury.    Note and exam were initially completed by Cathleen Fears, Student -PA. I have reviewed the note in its entirety and made changes as appropriate. I independently saw, interviewed, and examined the patient.

## 2022-08-22 NOTE — Progress Notes (Signed)
Rounding Note    Patient Name: Anna [name] Date of Encounter: 08/22/2022  [facility] Cardiologist: Armanda Magic, MD   Subjective   No chest pain or SOB, has knee pain needs her cream.   Hopes to go home by Sat for her great great grandson's birthday  Inpatient Medications    Scheduled Meds:  apixaban  2.5 mg Oral BID   arformoterol  15 mcg Nebulization BID   benzonatate  100 mg Oral BID   budesonide (PULMICORT) nebulizer solution  0.25 mg Nebulization BID   diclofenac Sodium  2 g Topical QID   docusate sodium  100 mg Oral Daily   ferrous sulfate  325 mg Oral Q breakfast   gabapentin  100 mg Oral TID   guaiFENesin  1,200 mg Oral BID   influenza vaccine adjuvanted  0.5 mL Intramuscular Tomorrow-1000   insulin aspart  0-15 Units Subcutaneous TID WC   methylPREDNISolone (SOLU-MEDROL) injection  40 mg Intravenous Daily   rosuvastatin  40 mg Oral Daily   sodium chloride flush  3 mL Intravenous Q12H   sodium chloride flush  3 mL Intravenous Q12H   Continuous Infusions:  sodium chloride 250 mL (08/21/22 2155)   cefTRIAXone (ROCEPHIN)  IV 2 g (08/22/22 0909)   doxycycline (VIBRAMYCIN) IV 100 mg (08/21/22 2156)   PRN Meds: sodium chloride, acetaminophen, albuterol, cyclobenzaprine, HYDROcodone-acetaminophen, LORazepam, menthol-cetylpyridinium, morphine injection, nitroGLYCERIN, ondansetron (ZOFRAN) IV, sodium chloride flush   Vital Signs    Vitals:   08/22/22 0415 08/22/22 0737 08/22/22 0850 08/22/22 0900  BP: 139/69 (!) 155/70  (!) 143/65  Pulse: (!) 53 60    Resp: (!) 21 19    Temp: 97.7 F (36.5 C) 97.8 F (36.6 C)    TempSrc: Oral Oral    SpO2: 96% 99% 98%   Weight:      Height:        Intake/Output Summary (Last 24 hours) at 08/22/2022 1002 Last data filed at 08/22/2022 0909 Gross per 24 hour  Intake 1757 ml  Output 800 ml  Net 957 ml      08/22/2022    2:37 AM 08/21/2022    5:47 AM 08/20/2022    4:37 AM  Last 3 Weights  Weight (lbs)  187 lb 6.3 oz 183 lb 6.8 oz 169 lb 8.5 oz  Weight (kg) 85 kg 83.2 kg 76.9 kg      Telemetry    Atrial fib rate controlled - Personally Reviewed  ECG    No new - Personally Reviewed  Physical Exam   GEN: No acute distress.   Neck: No JVD Cardiac: irreg irreg, no murmurs, rubs, or gallops.  Respiratory: few rales in bases to auscultation bilaterally. GI: Soft, nontender, non-distended  MS: 1+ edema; No deformity. Neuro:  Nonfocal  Psych: Normal affect   Labs    High Sensitivity Troponin:   Recent Labs  Lab 08/17/22 1028 08/17/22 1440 08/18/22 0038 08/18/22 0620  TROPONINIHS 158* 2,067* 5,091* 4,331*     Chemistry Recent Labs  Lab 08/17/22 1028 08/18/22 0038 08/18/22 7425 08/19/22 0043 08/20/22 0132 08/21/22 0227 08/22/22 0052  NA 136   < >  --  130* 128* 127* 121*  K 4.1   < >  --  4.0 4.2 4.3 4.0  CL 99   < >  --  97* 97* 96* 91*  CO2 25   < >  --  22 19* 19* 18*  GLUCOSE 152*   < >  --  137* 198* 169* 228*  BUN 13   < >  --  29* 32* 39* 43*  CREATININE 0.89   < >  --  1.52* 1.57* 1.78* 1.70*  CALCIUM 9.4   < >  --  8.5* 8.5* 8.8* 8.5*  MG  --   --  1.6* 2.0  --  2.0  --   PROT 6.9  --   --  6.0*  --  6.2*  --   ALBUMIN 3.4*  --   --  2.8*  --  2.8*  --   AST 24  --   --  49*  --  55*  --   ALT 13  --   --  23  --  37  --   ALKPHOS 114  --   --  92  --  93  --   BILITOT 0.9  --   --  0.7  --  0.6  --   GFRNONAA >60   < >  --  34* 32* 28* 29*  ANIONGAP 12   < >  --  11 12 12 12    < > = values in this interval not displayed.    Lipids  Recent Labs  Lab 08/18/22 0620  CHOL 69  TRIG 62  HDL 33*  LDLCALC 24  CHOLHDL 2.1    Hematology Recent Labs  Lab 08/20/22 0132 08/21/22 0227 08/22/22 0052  WBC 15.1* 10.9* 10.4  RBC 3.23* 3.41* 3.59*  HGB 9.2* 9.8* 10.3*  HCT 28.8* 29.8* 30.1*  MCV 89.2 87.4 83.8  MCH 28.5 28.7 28.7  MCHC 31.9 32.9 34.2  RDW 13.6 13.8 13.6  PLT 227 248 293   Thyroid No results for input(s): "TSH", "FREET4" in the  last 168 hours.  BNP Recent Labs  Lab 08/17/22 1440  BNP 697.2*    DDimer No results for input(s): "DDIMER" in the last 168 hours.   Radiology    US RENAL  Result Date: 08/20/2022 CLINICAL DATA:  Acute renal injury EXAM: RENAL / URINARY TRACT ULTRASOUND COMPLETE COMPARISON:  01/19/2020, CT from 08/17/2022 FINDINGS: Right Kidney: Renal measurements: 9.1 x 3.7 x 4.3 cm. = volume: 76 mL. 2.5 cm upper pole cyst is noted simple in nature. No follow-up is recommended. No mass lesion or hydronephrosis is noted. Left Kidney: Renal measurements: 9.5 x 4.4 x 4.3 cm. = volume: 95 mL. Echogenicity within normal limits. No mass or hydronephrosis visualized. Bladder: Not well visualized. Other: None. IMPRESSION: Simple right renal cyst stable in appearance from the prior exam. No acute abnormality noted. Electronically Signed   By: Inez Catalina M.D.   On: 08/20/2022 22:43   CARDIAC CATHETERIZATION  Result Date: 09/73/5329   LV end diastolic pressure is mildly elevated.  LVEDP 20 mm Hg.   There is no aortic valve stenosis.   Mild, nonobstructive CAD. Likely Takotsubo cardiomyopathy based on the RWMA noted on echo.  Results conveyed to her granddaughter, [name].    Cardiac Studies   Cath 08/16/2022 Conclusion     LV end diastolic pressure is mildly elevated.  LVEDP 20 mm Hg.   There is no aortic valve stenosis.  Minimal luminal irregularities with no significant obstructive CAD.     Echocardiogram 08/18/22  1. Left ventricular ejection fraction, by estimation, is 25%. The left  ventricle has severely decreased function. The left ventricle demonstrates  regional wall motion abnormalities (see scoring diagram/findings for  description). Left ventricular  diastolic parameters are indeterminate.   2. Right  ventricular systolic function is mildly reduced. The right  ventricular size is normal. There is mildly elevated pulmonary artery  systolic pressure.   3. Left atrial size was moderately  dilated.   4. Right atrial size was moderately dilated.   5. The mitral valve is abnormal. Mild mitral valve regurgitation. No  evidence of mitral stenosis.   6. The tricuspid valve is abnormal. Tricuspid valve regurgitation is mild  to moderate.   7. The aortic valve has an indeterminant number of cusps. There is mild  calcification of the aortic valve. There is mild thickening of the aortic  valve. Aortic valve regurgitation is not visualized. No aortic stenosis is  present.   8. The inferior vena cava is dilated in size with <50% respiratory  variability, suggesting right atrial pressure of 15 mmHg.   Patient Profile     86 y.o. female with history of atrial flutter on Eliquis, hypertension, hyperlipidemia presented with chest pain, shortness of breath, nausea  Assessment & Plan    NSTEMI/Takotsubo   - Patient presented complaining of chest pain. hsTn 158>>2067>>5091>>4331. EKG with subtle ST elevations in V2-5 with new TWI from prior EKG.   - Cardiac cath yesterday showed minimal luminal irregularities with no obstructive CAD - The echo and cath consistent with Takotsubo cardiomyopathy with stress MI - Denies any chest pain - Continue high-dose statin therapy - Stop aspirin since she does not have any significant plaque on cath and she is on DOAC for a flutter - No beta-blocker due to active wheezing and history of bradycardia   Acute systolic heart failure  Ischemic cardiomyopathy  - Echo from 04/2022 with EF 55-60% without regional wall motion abnormalities. Echo this admission with EF 25%, regional wall motion abnormalities, mildly reduced RV systolic function  - Heart cath yesterday with minimal luminal irregularities findings at cath and echo consistent with Takotsubo cardiomyopathy - CXR with possible NPA vs edema. BNP elevated to 677 - creatinine up to 1.52 after a few days of IV lasix. Home irbesartan held. Not on BB due to history of bradycardia  -Started on Entresto  24-26 mg twice daily and Lasix 20 mg daily yesterday but serum creatinine has bumped post cath to 1.78 from 1.57 and today Cr 1.70 so we will hold both of these medicines today>> will restart once serum creatinine improved --+1,126.5 wt up from low of 76.9 to 85 Kg.  May need dose of IV lasix today defer to Dr. Mayford Knife.    AKI  see above   Chronic atrial flutter  - HR adequately controlled and not requiring any AVN blocking agents - stop IV heparin and restart Eliquis 2.5 mg twice daily (dosed for age greater than 80 and serum creatinine greater than 1.5)  Hyponatremia Na 121 today down from 127    Multifocal PNA  - On antibiotics per primary   DM-2/abnromal imaging per IM     For questions or updates, please contact [facility] Please consult [email or web address] for contact info under        Signed, Nada Boozer, NP  08/22/2022, 10:02 AM

## 2022-08-22 NOTE — Progress Notes (Signed)
Mobility Specialist Progress Note:   08/22/22 1444  Mobility  Activity Ambulated with assistance in room;Ambulated with assistance to bathroom  Level of Assistance Minimal assist, patient does 75% or more  Assistive Device Front wheel walker  Distance Ambulated (ft) 30 ft  Activity Response Tolerated well  $Mobility charge 1 Mobility   Pt received in bed willing to participate in mobility. MinA to stand. Pt has posterior lean and required cues to lean forward. Left in bed with call bell in reach and all needs met.   [facility] Surveyor, mining Chat only

## 2022-08-22 NOTE — Progress Notes (Signed)
Daily Progress Note   Patient Name: Anna [name]       Date: 08/22/2022 DOB: [birth year]  Age: 86 y.o. MRN#: [id number] Attending Physician: Patrecia Pour, MD Primary Care Physician: Pcp, No Admit Date: 08/17/2022  Reason for Consultation/Follow-up: Establishing goals of care  Patient Profile/HPI:  86 y.o. female  with past medical history of a flutter, mitral regurg/stenosis, HTN, HLD admitted on 08/17/2022 with shortness of breath. Workup reveals NSTEMI and she's been started on heparin drip. Going for cardiac cath today. She also has pneumonia and is being treated with antibiotics.  ECHO this admission shows EF 25%. No prior hospitalizations. Palliative medicine consulted for goals of care.   Subjective: Chart reviewed. Patient with some ongoing hyponatremia. Nephrology consulted.  Anna [name] was awake, alert and oriented. She shares that Chaplain came by earlier today, but she didn't complete AD with him because she thought she needed to work on them with me. I reviewed and assisted her in filling them out.  We discussed code status. She continues to say she wouldn't want CPR, but then when we discuss putting in an order so that CPR isn't done to her she changes her mind. She shares that she is "scared" of changing her code status. She notes she watched "Chucky movies" yesterday and talking about DNR scares her more than the [employer]. Emotional support provided. She will continue to consider and discuss with her daughters.  She walked today to the bathroom. She is eager to return home.     Physical Exam Vitals and nursing note reviewed.  Constitutional:      General: She is not in acute distress. Pulmonary:     Effort: Pulmonary effort is normal.  Neurological:     General: No focal  deficit present.     Mental Status: She is alert.             Vital Signs: BP (!) 143/65 (BP Location: Right Arm)   Pulse 60   Temp 97.8 F (36.6 C) (Oral)   Resp 19   Ht 5\' 5"  (1.651 m)   Wt 85 kg   SpO2 98%   BMI 31.18 kg/m  SpO2: SpO2: 98 % O2 Device: O2 Device: Room Air O2 Flow Rate: O2 Flow Rate (L/min): 2 L/min  Intake/output summary:  Intake/Output Summary (Last  24 hours) at 08/22/2022 1558 Last data filed at 08/22/2022 1104 Gross per 24 hour  Intake 1637 ml  Output 900 ml  Net 737 ml   LBM: Last BM Date : 08/21/22 Baseline Weight: Weight: 77.6 kg Most recent weight: Weight: 85 kg       Palliative Assessment/Data: PPS: 50%      Patient Active Problem List   Diagnosis Date Noted   Elevated troponin    Acute heart failure (HCC)    NSTEMI (non-ST elevated myocardial infarction) ([place]) 08/17/2022   Mitral stenosis 05/07/2021   Recurrent falls 04/24/2021   History of vertebral fracture 04/24/2021   Lumbar radiculopathy 04/24/2021   Anemia due to stage 3a chronic kidney disease ([place]) 03/07/2020   Stage 3a chronic kidney disease ([place]) 03/07/2020   Mixed incontinence urge and stress 03/09/2018   Prediabetes 03/09/2018   OA (osteoarthritis) of knee 03/09/2018   Sinus bradycardia 12/18/2016   Estrogen deficiency 07/03/2016   Mitral regurgitation 03/01/2014   Chronic atrial flutter ([place]) 03/01/2014   Permanent atrial fibrillation ([place]) 12/24/2013   Essential hypertension, benign 12/24/2013   Heart murmur 12/24/2013   Hyperlipemia 12/24/2013    Palliative Care Assessment & Plan    Assessment/Recommendations/Plan  Continue current plan of care Requested chaplain assistance to notarize AD/HCPOA Attempted to call daughter Anna [name]- left message   Code Status: Full code  Prognosis:  Unable to determine  Discharge Planning: Home with [place] was discussed with patient.  Thank you for allowing the Palliative Medicine Team to assist in  the care of this patient.  Total time:  60 minutes Prolonged billing:      Greater than 50%  of this time was spent counseling and coordinating care related to the above assessment and plan.  Mariana Kaufman, AGNP-C Palliative Medicine   Please contact Palliative Medicine Team phone at [phone] for questions and concerns.

## 2022-08-23 DIAGNOSIS — J189 Pneumonia, unspecified organism: Secondary | ICD-10-CM | POA: Diagnosis not present

## 2022-08-23 DIAGNOSIS — Z7189 Other specified counseling: Secondary | ICD-10-CM | POA: Diagnosis not present

## 2022-08-23 DIAGNOSIS — I5181 Takotsubo syndrome: Secondary | ICD-10-CM | POA: Diagnosis not present

## 2022-08-23 DIAGNOSIS — I214 Non-ST elevation (NSTEMI) myocardial infarction: Secondary | ICD-10-CM | POA: Diagnosis not present

## 2022-08-23 LAB — BASIC METABOLIC PANEL
Anion gap: 12 (ref 5–15)
BUN: 34 mg/dL — ABNORMAL HIGH (ref 8–23)
CO2: 19 mmol/L — ABNORMAL LOW (ref 22–32)
Calcium: 8.9 mg/dL (ref 8.9–10.3)
Chloride: 97 mmol/L — ABNORMAL LOW (ref 98–111)
Creatinine, Ser: 1.33 mg/dL — ABNORMAL HIGH (ref 0.44–1.00)
GFR, Estimated: 39 mL/min — ABNORMAL LOW (ref >60)
Glucose, Bld: 144 mg/dL — ABNORMAL HIGH (ref 70–99)
Potassium: 4.1 mmol/L (ref 3.5–5.1)
Sodium: 128 mmol/L — ABNORMAL LOW (ref 135–145)

## 2022-08-23 LAB — GLUCOSE, CAPILLARY
Glucose-Capillary: 140 mg/dL — ABNORMAL HIGH (ref 70–99)
Glucose-Capillary: 82 mg/dL (ref 70–99)
Glucose-Capillary: 111 mg/dL — ABNORMAL HIGH (ref 70–99)
Glucose-Capillary: 160 mg/dL — ABNORMAL HIGH (ref 70–99)

## 2022-08-23 LAB — CORTISOL-AM, BLOOD: Cortisol - AM: 3.2 ug/dL — ABNORMAL LOW (ref 6.7–22.6)

## 2022-08-23 LAB — T4, FREE: Free T4: 0.96 ng/dL (ref 0.61–1.12)

## 2022-08-23 LAB — TSH: TSH: 0.317 u[IU]/mL — ABNORMAL LOW (ref 0.350–4.500)

## 2022-08-23 MED ORDER — FERROUS SULFATE 325 (65 FE) MG PO TABS
325.0000 mg | ORAL_TABLET | Freq: Every day | ORAL | Status: DC
  Administered 2022-08-24: 325 mg via ORAL
  Filled 2022-08-23: qty 1

## 2022-08-23 MED ORDER — DOXYCYCLINE HYCLATE 100 MG PO TABS
100.0000 mg | ORAL_TABLET | Freq: Two times a day (BID) | ORAL | Status: AC
  Administered 2022-08-23 (×2): 100 mg via ORAL
  Filled 2022-08-23 (×2): qty 1

## 2022-08-23 MED ORDER — INSULIN ASPART 100 UNIT/ML IJ SOLN
0.0000 [IU] | Freq: Three times a day (TID) | INTRAMUSCULAR | Status: DC
  Administered 2022-08-24: 1 [IU] via SUBCUTANEOUS

## 2022-08-23 NOTE — Progress Notes (Signed)
Occupational Therapy Treatment Patient Details Name: Anna [name] MRN: [id number] DOB: 1936/09/01 Today's Date: 08/23/2022   History of present illness 86 y.o. female presents to [facility] hospital on 08/17/2022 with new onset cough, SOB, nausea and chest pain. Chest x-ray suspicious for PNA. Troponins uptrending with concern for NSTEMI. PMH includes HLD, HTN, mitral regurgitation, OA, PAF.   OT comments  Pt ambulated to sink with RW and min assist. Stood at sink for 2 grooming activities. Pt ambulated 2 laps in her room and returned to bed. Tolerated well with Sp02 at 94-96%. Pt is eager to go home, reports she has a birthday party to attend this weekend.    Recommendations for follow up therapy are one component of a multi-disciplinary discharge planning process, led by the attending physician.  Recommendations may be updated based on patient status, additional functional criteria and insurance authorization.    Follow Up Recommendations  Home health OT    Assistance Recommended at Discharge Frequent or constant Supervision/Assistance  Patient can return home with the following  A little help with walking and/or transfers;A lot of help with bathing/dressing/bathroom;Assistance with cooking/housework;Assist for transportation;Help with stairs or ramp for entrance   Equipment Recommendations  Tub/shower seat    Recommendations for Other Services      Precautions / Restrictions Precautions Precautions: Fall       Mobility Bed Mobility Overal bed mobility: Needs Assistance Bed Mobility: Supine to Sit, Sit to Supine     Supine to sit: Min assist Sit to supine: Min assist   General bed mobility comments: increased time, assist to elevate trunk and for LEs back into bed    Transfers Overall transfer level: Needs assistance Equipment used: Rolling walker (2 wheels) Transfers: Sit to/from Stand Sit to Stand: Min assist           General transfer comment: assist to rise and  steady     Balance Overall balance assessment: Needs assistance   Sitting balance-Leahy Scale: Fair       Standing balance-Leahy Scale: Poor Standing balance comment: reliant on RW to ambulate, min guard for static standing                           ADL either performed or assessed with clinical judgement   ADL Overall ADL's : Needs assistance/impaired Eating/Feeding: Independent;Bed level   Grooming: Min guard;Standing;Oral care;Wash/dry hands           Upper Body Dressing : Set up;Sitting                   Functional mobility during ADLs: Minimal assistance;Rolling walker (2 wheels)      Extremity/Trunk Assessment              Vision       Perception     Praxis      Cognition Arousal/Alertness: Awake/alert Behavior During Therapy: WFL for tasks assessed/performed Overall Cognitive Status: Within Functional Limits for tasks assessed                                          Exercises      Shoulder Instructions       General Comments      Pertinent Vitals/ Pain       Pain Assessment Pain Assessment: Faces Faces Pain Scale: No hurt  Home Living  Prior Functioning/Environment              Frequency  Min 2X/week        Progress Toward Goals  OT Goals(current goals can now be found in the care plan section)  Progress towards OT goals: Progressing toward goals  Acute Rehab OT Goals OT Goal Formulation: With patient Time For Goal Achievement: 08/28/22 Potential to Achieve Goals: Good  Plan Discharge plan remains appropriate    Co-evaluation                 AM-PAC OT "6 Clicks" Daily Activity     Outcome Measure   Help from another person eating meals?: None Help from another person taking care of personal grooming?: A Little Help from another person toileting, which includes using toliet, bedpan, or urinal?: A Little Help  from another person bathing (including washing, rinsing, drying)?: A Lot Help from another person to put on and taking off regular upper body clothing?: A Little Help from another person to put on and taking off regular lower body clothing?: A Lot 6 Click Score: 17    End of Session Equipment Utilized During Treatment: Gait belt;Rolling walker (2 wheels)  OT Visit Diagnosis: Unsteadiness on feet (R26.81);Muscle weakness (generalized) (M62.81);Other abnormalities of gait and mobility (R26.89)   Activity Tolerance Patient tolerated treatment well   Patient Left in bed;with call bell/phone within reach;with bed alarm set   Nurse Communication          Time: 1455-1511 OT Time Calculation (min): 16 min  Charges: OT General Charges $OT Visit: 1 Visit OT Treatments $Self Care/Home Management : 8-22 mins  Cleta Alberts, OTR/L Acute Rehabilitation Services Office: [phone]  Malka So 08/23/2022, 3:19 PM

## 2022-08-23 NOTE — Progress Notes (Addendum)
   Heart Failure Stewardship Pharmacist Progress Note   PCP: Pcp, No PCP-Cardiologist: Fransico Him, MD    HPI:  86 yo F with PMH of aflutter, HTN, and HLD.  Presented to the ED on 10/21 with chest pain, cough, and shortness of breath. CXR with suspicions for PNA vs mild pulmonary edema. Chest CT with cardiomegaly. Troponin elevated and peaked to 5091. Admitted for NSTEMI. An ECHO was done on 10/22 and showed LVEF 25%, regional wall motion abnormalities, RV mildly reduced, mild MR, and mild-moderate TR. Prior ECHO in July 2023 with LVEF 55-60%  LHC on 10/24 showed mild nonobstructive CAD, consistent with Takotsubo.  Nephrology consulted for AKI.   Current HF Medications: SGLT2i: Farixga 10 mg daily  Prior to admission HF Medications: Diuretic: furosemide 10 mg daily PRN ACE/ARB/ARNI: olmesartan 20 mg daily  Pertinent Lab Values: Serum creatinine 1.33, BUN 34, Potassium 4.1, Sodium 128, BNP 697.2, Magnesium 2.0, A1c 5.8   Vital Signs: Weight: 190 lbs (admission weight: 177 lbs) Blood pressure: 130/60s  Heart rate: 60s  I/O: -2L yesterday; net -442mL  Medication Assistance / Insurance Benefits Check: Does the patient have prescription insurance?  Yes Type of insurance plan: [place] [employer]  Outpatient Pharmacy:  Prior to admission [facility] Is the patient willing to use [facility] at discharge? Yes Is the patient willing to transition their outpatient pharmacy to utilize a [facility] outpatient pharmacy?   Pending    Assessment: 1. Acute systolic CHF (LVEF 15%), due to Takotsubo CM. LHC with nonobstructive CAD. NYHA class II symptoms. - Off IV lasix with AKI. Improved today. Strict I/Os and daily weights. Has been documenting bed weights, encourage standing weights for better accuracy. Keep K>4 and Mag>2. - Not on BB due to history of bradycardia. - Holding Entresto with AKI. Consider resuming tomorrow if SCr stable. - Consider starting spironolactone  likely at follow up to allow for resuming other GDMT post AKI. - Agree with starting Farxiga 10 mg daily   Plan: 1) Medication changes recommended at this time: - Agree with changes  2) Patient assistance: - Has [place] Medicaid  3)  Education  - Patient has been educated on potential additions to HF medication regimen - Patient verbalizes understanding that over the next few months, these medication doses may change and more medications may be added to optimize HF regimen - Patient has been educated on basic disease state pathophysiology and goals of therapy   Kerby Nora, PharmD, BCPS Heart Failure Stewardship Pharmacist Phone [phone]

## 2022-08-23 NOTE — Progress Notes (Signed)
[facility] PROGRESS NOTE  Anna [name]  XTA:[id number] DOB: 1936-02-27 DOA: 08/17/2022 PCP: Merryl Hacker, No  Brief Narrative: Anna [name] is an 86 y.o. female with medical history significant of chronic a flutter on Eliquis, mitral regurgitation/stenosis, HTN, and HLD who presented 10/21 with cough, shortness of breath, chest pain found to have pneumonia thought to have caused stress cardiomyopathy/NSTEMI causing acute HFrEF. diuresis limited by AKI. Hospitalization also complicated by hyponatremia.  Subjective: Feels much better over the past 24 hours. No fever, less cough/chest congestion. Off oxygen, no wheezing. Wants to go home.  Objective: BP 134/74 (BP Location: Right Wrist)   Pulse 71   Temp 97.6 F (36.4 C) (Oral)   Resp 20   Ht _0  (1.651 m)   Wt 86.3 kg   SpO2 99%   BMI 31.66 kg/m   Gen: Elderly interactive pleasant female in no distress Pulm: Minimal crackles bilaterally, no wheezes, good air exchange, normal effort.  CV: Irreg irreg, rate in 70's. Stable 1+ LE edema. GI: Soft, NT, ND, +BS  Neuro: Alert and oriented. No new focal deficits. Ext: Warm, no deformities Skin: No rashes, lesions or ulcers on visualized skin   Assessment & Plan: Acute hypoxic respiratory failure due to multifocal pneumonia: Based on CT infiltrates with RLL predominance on my personal review of images today. Urine strep, legionella Ag's neg.  - Weaned to room air.  - DC steroids.   - Blood cultures NG5D, sputum culture normal flora. Leukocytosis has resolved. Will complete ceftriaxone, doxycycline today (10/1 - 10/27) - Discussed with patient the need for repeat CT chest in 3-6 months. - Continue antitussives, symptoms abating.  Stress (Takotsubo) cardiomyopathy, NSTEMI, acute HFrEF: Troponin peaked at 5,091. LVEF 25% with WMA's. LHC 10/24 by Dr. Irish Lack revealed mild, nonobstructive CAD, elevated LVEDP, 81mHg.  - SGLT2i added 10/26, if renal function stabilizes, may be able to add  entresto, lasix 10/28. GDMT initiation per cardiology. - Holding beta blocker with hx bradycardia. - Consider spiro once renal function improved. - Monitor I/O, daily weights.  - Continue statin. Hold ASA since on DOAC.  AKI on stage IIIa CKD: SCr up 1.1 >> 1.5 > 1.78 >> 1.33, suspect cardiorenal syndrome. Also had contrast with cath as well as initiation of ARNI and lasix on 10/24. U/S without hydronephrosis, UA bland. - Improved with diuresis by SGLT2i started by nephrology 10/26, will continue. No addition of lasix today since she's diuresing with this already. May be candidate for entresto 10/28.   Hyponatremia: Improved with SGLT2i, still suspect hypervolemic etiology.  - Appreciate nephrology assistance, continue fluid restriction.   Chronic atrial flutter: Rate controlled without AV nodal agents - Continue anticoagulation, converted back to eliquis 2.581mpo BID (due to age and CrCl)  Goals of care: Pt wishes to remain full code during this hospitalization. This is at odds with her professed desire not to undergo CPR in the event of cardiorespiratory arrest. Palliative care met with patient and family formally 10/25. We will continue discussing this issue as we maintain full curative measures as above.     Abnormal imaging findings:  - Needs outpatient follow up for mediastinal and right hilar adenopathy, pulmonary nodules with CT chest in 3-6 months - Needs outpatient pelvic U/S for enlarged L ovary. D/w pt.  - CT also noted heterogenicity and fat stranding of R pericolic gutter peritoneum for which "attention at follow up" is recommended.  NIDT2DM: HbA1c 5.8%.  - Continue SSI, decreased to very sensitive SSI since baseline  glycemic control wnl and we're stopping steroids.  HTN: - Medication changes as above, stopped norvasc.   Patrecia Pour, MD [facility] [email or web address] 08/23/2022, 1:58 PM

## 2022-08-23 NOTE — Progress Notes (Signed)
Cancellation Note  Patient Details Name: Anna [name] MRN: [id number] DOB: 1936-01-25   Cancelled Treatment:     Pt presents supine in bed, but just returned from amb w/ mobility and c/o fatigue.  PT returned to bedside in late AM, but pt still c/o fatigue and refuses therapy at this time.  PT will attempt later if able.   Ladoris Gene 08/23/2022, 11:52 AM

## 2022-08-23 NOTE — Plan of Care (Signed)
  Problem: Health Behavior/Discharge Planning: Goal: Ability to identify and utilize available resources and services will improve Outcome: Progressing   Problem: Coping: Goal: Ability to adjust to condition or change in health will improve Outcome: Progressing

## 2022-08-23 NOTE — Progress Notes (Signed)
[facility] Progress Note    Assessment/ Plan:   Anna [name] is a/an 86 y.o. female with a past medical history notable for chronic a flutter on Eliquis, mitral regurtigation/stenosis, HTN, CKD 3a and HLD who presented 10/21 with cough, shortness of breath, chest pain and found to have pneumonia which is thought to have caused stress cardiomyopathy/NSTEMI causing acute HFrEF. Hospitalization has been complicated by AKI and hyponatremia.      Non-Oliguric AKI on CKD 3a:  Suspect cardiorenal syndrome in setting of acute HFrEF. Likely worsened by contrast during cardiac cath. Renal U/S completed 10/24 to r/o structural cause. Scr 1.1 >> 1.5 >> 1.78. UA bland.  SCr improved today to 1.33. She remains mildly overloaded. -AKI should improve further as CHF stabilizes and CIN resolved -Continue to monitor daily Cr -Dose meds for GFR<15 -Monitor Daily I/Os, Daily weight  -Maintain MAP>65 for optimal renal perfusion.  -Avoid further nephrotoxins including NSAIDS, Morphine. Unless absolutely necessary, avoid CT with contrast and/or MRI with gadolinium.    -Currently no acute indication for HD   Hypervolemic Hyponatremia 2/2 Acute HFrEF Suspect secondary to volume overload. Some false hyponatremia related to hyperglycemia so have to correct for the glucose. Differential includes SIADH however clinical picture points towards acute heart failure as cause. -Started Farxiga 10mg  for management of volume, HF, and CKD. There is also accumulating data for the use of SGLT2is in the setting of acute heart failure particularly in its relation to improving hyponatremia. Data even exists for the use of SGLT2is in hyponatremia without heart failure -Diurese as clinically indicated. Urine output increased yesterday. Will consider adding lasix if volume status does not continue to improve but can hold off for now -Fluid restriction -Strict I/O, Daily weight    [name], Shchekochikhin D,  D. Impact of dapagliflozin treatment on serum sodium concentrations in acute heart failure. Cardiorenal Med. 2023 Feb 20. doi: 10.1159/000529614. Epub ahead of print. PMID: 04-02-1990.   Acute hypoxic respiratory failure due to multifocal pneumonia: Based on CT infiltrates with RLL predominance. Urine strep, legionella Ag's neg. On doxy/ceftriaxone until 10/27. Antitussives and lozenges to aid with symptoms in setting of reduced fluid intake.    Stress (Takotsubo) cardiomyopathy, NSTEMI, acute HFrEF: Troponin peaked at 5,091. LVEF 25% with WMA's. LHC 10/24 by Dr. 11/24 revealed mild, nonobstructive CAD, elevated LVEDP Eldridge Dace.  -Newly added entresto, lasix held due to AKI.  If Cr further improved tomorrow I think the entresto can be added back.  Lasix dosing as clinically indicated - just added farxiga and is diuresing so hold on lasix today -Holding BB with hx of bradycardia -Cardiology following   Chronic atrial flutter: Rate controlled without AV nodal agents -Continue anticoagulation, converted back to eliquis 2.5mg  po BID   NIDT2DM -Per primary team - Continue SSI, augment to resistant scale add HS coverage due to steroid-induced hyperglycemia  Subjective:   Patient reports she feels much better today. Urinated a lot yesterday and feels like she must be lighter today. No complaints. Denies HA, dizziness, altered taste, CP, SOB, N/V, muscle weakness/cramps.   Notable labs include Na 128, K 4.1, BUN 34, Cr 1.33.    Objective:   BP (!) 135/55 (BP Location: Right Arm)   Pulse 60   Temp 97.6 F (36.4 C) (Oral)   Resp 18   Ht 5\' 5"  (1.651 m)   Wt 86.3 kg   SpO2 97%   BMI 31.66 kg/m  Intake/Output Summary (Last 24 hours) at 08/23/2022 6834 Last data filed at 08/23/2022 0900 Gross per 24 hour  Intake 968.43 ml  Output 2525 ml  Net -1556.57 ml   Weight change: 1.3 kg  Physical Exam: Gen: elderly, alert female, laying  in bed in NAD CVS: Irregularly irregular rhythm, no murmurs, rubs, or gallops.  Resp: Normal work of breathing, few scattered rales to auscultation bilaterally Abd: Soft, non-tender, non-distended Ext: Warm, 1+ LE edema Psych: Alert, engaged, appropriate mood and affect Neuro: Normal speech, no gross focal deficits   Imaging: No results found.  Labs: BMET Recent Labs  Lab 08/18/22 0038 08/19/22 0043 08/20/22 0132 08/21/22 0227 08/22/22 0052 08/22/22 0741 08/23/22 0124  NA 133* 130* 128* 127* 121* 128* 128*  K 4.7 4.0 4.2 4.3 4.0 4.2 4.1  CL 97* 97* 97* 96* 91* 94* 97*  CO2 22 22 19* 19* 18* 19* 19*  GLUCOSE 204* 137* 198* 169* 228* 209* 144*  BUN 19 29* 32* 39* 43* 42* 34*  CREATININE 1.10* 1.52* 1.57* 1.78* 1.70* 1.72* 1.33*  CALCIUM 9.0 8.5* 8.5* 8.8* 8.5* 8.8* 8.9  PHOS  --  3.6  --  4.6  --   --   --    CBC Recent Labs  Lab 08/17/22 1028 08/19/22 0043 08/20/22 0132 08/21/22 0227 08/22/22 0052  WBC 12.5* 11.0* 15.1* 10.9* 10.4  NEUTROABS 10.6* 7.2  --  9.6*  --   HGB 10.5* 9.7* 9.2* 9.8* 10.3*  HCT 33.0* 29.8* 28.8* 29.8* 30.1*  MCV 90.7 87.9 89.2 87.4 83.8  PLT 263 247 227 248 293    Medications:     apixaban  2.5 mg Oral BID   arformoterol  15 mcg Nebulization BID   benzonatate  100 mg Oral BID   budesonide (PULMICORT) nebulizer solution  0.25 mg Nebulization BID   dapagliflozin propanediol  10 mg Oral Daily   diclofenac Sodium  2 g Topical QID   docusate sodium  100 mg Oral Daily   ferrous sulfate  325 mg Oral Q breakfast   gabapentin  100 mg Oral TID   guaiFENesin  1,200 mg Oral BID   influenza vaccine adjuvanted  0.5 mL Intramuscular Tomorrow-1000   insulin aspart  0-20 Units Subcutaneous TID WC   insulin aspart  0-5 Units Subcutaneous QHS   rosuvastatin  40 mg Oral Daily   sodium chloride flush  3 mL Intravenous Q12H   sodium chloride flush  3 mL Intravenous Q12H      Melina Modena [facility] PA-S2 08/23/2022, 9:22 AM    I personally saw  and evaluated the patient and agree with the assessment and plan by PA Student Cathleen Fears.  I independently performed an exam and edited the note above.   Jannifer Hick MD [facility] Pager [phone]

## 2022-08-23 NOTE — Progress Notes (Signed)
Rounding Note    Patient Name: Anna [name] Date of Encounter: 08/23/2022  [place] Cardiologist: Fransico Him, MD   Subjective   No chest pain or SOB - she notes she passed a lot of urine last pm   Inpatient Medications    Scheduled Meds:  apixaban  2.5 mg Oral BID   arformoterol  15 mcg Nebulization BID   benzonatate  100 mg Oral BID   budesonide (PULMICORT) nebulizer solution  0.25 mg Nebulization BID   dapagliflozin propanediol  10 mg Oral Daily   diclofenac Sodium  2 g Topical QID   docusate sodium  100 mg Oral Daily   ferrous sulfate  325 mg Oral Q breakfast   gabapentin  100 mg Oral TID   guaiFENesin  1,200 mg Oral BID   influenza vaccine adjuvanted  0.5 mL Intramuscular Tomorrow-1000   insulin aspart  0-20 Units Subcutaneous TID WC   insulin aspart  0-5 Units Subcutaneous QHS   methylPREDNISolone (SOLU-MEDROL) injection  40 mg Intravenous Daily   rosuvastatin  40 mg Oral Daily   sodium chloride flush  3 mL Intravenous Q12H   sodium chloride flush  3 mL Intravenous Q12H   Continuous Infusions:  sodium chloride Stopped (08/22/22 2329)   cefTRIAXone (ROCEPHIN)  IV Stopped (08/22/22 0941)   doxycycline (VIBRAMYCIN) IV Stopped (08/22/22 2240)   PRN Meds: sodium chloride, acetaminophen, albuterol, cyclobenzaprine, HYDROcodone-acetaminophen, LORazepam, menthol-cetylpyridinium, morphine injection, nitroGLYCERIN, ondansetron (ZOFRAN) IV, sodium chloride flush   Vital Signs    Vitals:   08/22/22 2030 08/22/22 2345 08/23/22 0001 08/23/22 0559  BP: 135/77 (!) 151/68  133/68  Pulse: 67 63  68  Resp: 18 16  20   Temp: (!) 97.4 F (36.3 C) 97.7 F (36.5 C)  97.7 F (36.5 C)  TempSrc: Oral Oral  Oral  SpO2: 97% 98%  98%  Weight:   86.3 kg   Height:        Intake/Output Summary (Last 24 hours) at 08/23/2022 0801 Last data filed at 08/23/2022 0602 Gross per 24 hour  Intake 1268.43 ml  Output 2375 ml  Net -1106.57 ml      08/23/2022   12:01  AM 08/22/2022    2:37 AM 08/21/2022    5:47 AM  Last 3 Weights  Weight (lbs) 190 lb 4.1 oz 187 lb 6.3 oz 183 lb 6.8 oz  Weight (kg) 86.3 kg 85 kg 83.2 kg      Telemetry    Atrial fib - Personally Reviewed  ECG    No new - Personally Reviewed  Physical Exam   GEN: No acute distress.   Neck: No JVD Cardiac: irreg irreg, no murmurs, rubs, or gallops.  Respiratory: few scattered rales to auscultation bilaterally. GI: Soft, nontender, non-distended  MS: 1+ pitting edema; No deformity. Neuro:  Nonfocal  Psych: Normal affect   Labs    High Sensitivity Troponin:   Recent Labs  Lab 08/17/22 1028 08/17/22 1440 08/18/22 0038 08/18/22 0620  TROPONINIHS 158* 2,067* 5,091* 4,331*     Chemistry Recent Labs  Lab 08/17/22 1028 08/18/22 0038 08/18/22 2951 08/19/22 0043 08/20/22 0132 08/21/22 0227 08/22/22 0052 08/22/22 0741 08/23/22 0124  NA 136   < >  --  130*   < > 127* 121* 128* 128*  K 4.1   < >  --  4.0   < > 4.3 4.0 4.2 4.1  CL 99   < >  --  97*   < > 96* 91*  94* 97*  CO2 25   < >  --  22   < > 19* 18* 19* 19*  GLUCOSE 152*   < >  --  137*   < > 169* 228* 209* 144*  BUN 13   < >  --  29*   < > 39* 43* 42* 34*  CREATININE 0.89   < >  --  1.52*   < > 1.78* 1.70* 1.72* 1.33*  CALCIUM 9.4   < >  --  8.5*   < > 8.8* 8.5* 8.8* 8.9  MG  --   --  1.6* 2.0  --  2.0  --   --   --   PROT 6.9  --   --  6.0*  --  6.2*  --   --   --   ALBUMIN 3.4*  --   --  2.8*  --  2.8*  --   --   --   AST 24  --   --  49*  --  55*  --   --   --   ALT 13  --   --  23  --  37  --   --   --   ALKPHOS 114  --   --  92  --  93  --   --   --   BILITOT 0.9  --   --  0.7  --  0.6  --   --   --   GFRNONAA >60   < >  --  34*   < > 28* 29* 29* 39*  ANIONGAP 12   < >  --  11   < > 12 12 15 12    < > = values in this interval not displayed.    Lipids  Recent Labs  Lab 08/18/22 0620  CHOL 69  TRIG 62  HDL 33*  LDLCALC 24  CHOLHDL 2.1    Hematology Recent Labs  Lab 08/20/22 0132  08/21/22 0227 08/22/22 0052  WBC 15.1* 10.9* 10.4  RBC 3.23* 3.41* 3.59*  HGB 9.2* 9.8* 10.3*  HCT 28.8* 29.8* 30.1*  MCV 89.2 87.4 83.8  MCH 28.5 28.7 28.7  MCHC 31.9 32.9 34.2  RDW 13.6 13.8 13.6  PLT 227 248 293   Thyroid  Recent Labs  Lab 08/23/22 0124  TSH 0.317*    BNP Recent Labs  Lab 08/17/22 1440  BNP 697.2*    DDimer No results for input(s): "DDIMER" in the last 168 hours.   Radiology    No results found.  Cardiac Studies   Cath 08/16/2022 Conclusion     LV end diastolic pressure is mildly elevated.  LVEDP 20 mm Hg.   There is no aortic valve stenosis.  Minimal luminal irregularities with no significant obstructive CAD.     Echocardiogram 08/18/22  1. Left ventricular ejection fraction, by estimation, is 25%. The left  ventricle has severely decreased function. The left ventricle demonstrates  regional wall motion abnormalities (see scoring diagram/findings for  description). Left ventricular  diastolic parameters are indeterminate.   2. Right ventricular systolic function is mildly reduced. The right  ventricular size is normal. There is mildly elevated pulmonary artery  systolic pressure.   3. Left atrial size was moderately dilated.   4. Right atrial size was moderately dilated.   5. The mitral valve is abnormal. Mild mitral valve regurgitation. No  evidence of mitral stenosis.   6. The tricuspid valve is abnormal. Tricuspid  valve regurgitation is mild  to moderate.   7. The aortic valve has an indeterminant number of cusps. There is mild  calcification of the aortic valve. There is mild thickening of the aortic  valve. Aortic valve regurgitation is not visualized. No aortic stenosis is  present.   8. The inferior vena cava is dilated in size with <50% respiratory  variability, suggesting right atrial pressure of 15 mmHg.   Patient Profile     86 y.o. female with history of atrial flutter on Eliquis, hypertension, hyperlipidemia presented  with chest pain, shortness of breath, nausea  Assessment & Plan    NSTEMI/Takotsubo   - Patient presented complaining of chest pain. hsTn 158>>2067>>5091>>4331. EKG with subtle ST elevations in V2-5 with new TWI from prior EKG.   - Cardiac cath yesterday showed minimal luminal irregularities with no obstructive CAD - The echo and cath consistent with Takotsubo cardiomyopathy with stress MI - Denies any chest pain - Continue high-dose statin therapy - Stop aspirin since she does not have any significant plaque on cath and she is on DOAC for a flutter - No beta-blocker due to active wheezing and history of bradycardia   Acute systolic heart failure  Ischemic cardiomyopathy  - Echo from 04/2022 with EF 55-60% without regional wall motion abnormalities. Echo this admission with EF 25%, regional wall motion abnormalities, mildly reduced RV systolic function  - Heart cath 08/20/22 with minimal luminal irregularities findings at cath and echo consistent with Takotsubo cardiomyopathy - CXR with possible NPA vs edema. BNP elevated to 677 - creatinine up to 1.52 after a few days of IV lasix. Home irbesartan held. Not on BB due to history of bradycardia  -Started on Entresto 24-26 mg twice daily and Lasix 20 mg daily yesterday but serum creatinine has bumped post cath to 1.57>>1.78 >> Cr 1.70 and today 1.33 so we will hold both of these medicines today>> will restart once serum creatinine improved resume today vs. tomorrow --neg 30 since admit wt up from low of 76.9 to 86.3 Kg.   -farxiga started   AKI  see above  Nephrology did see yesterday   Chronic atrial flutter  - HR adequately controlled and not requiring any AVN blocking agents - stop IV heparin and restart Eliquis 2.5 mg twice daily (dosed for age greater than 80 and serum creatinine greater than 1.5)   Hyponatremia Na 128 today up from low of 121 some false hyponatremia related to hyperglycemia     Multifocal PNA  - On antibiotics per  primary    DM-2/abnormal imaging per IM        For questions or updates, please contact [facility] Please consult [email or web address] for contact info under        Signed, Nada Boozer, NP  08/23/2022, 8:01 AM

## 2022-08-23 NOTE — Progress Notes (Signed)
This chaplain responded to PMT NP-Kasie's consult for notarizing the Pt. Advance Directive:  HCPOA and Living Will.  The Pt. is awake and conversational at the time of the visit. The Pt. completed AD education and answering clarifying questions on the role and purpose of an AD. The chaplain is appreciative of the student RNs assistance with bedside care.  The chaplain is present with the Pt., notary, and two witnesses for the notarizing of the Pt. AD. The Pt. named Anna [name] as her healthcare agent. If this person is unwilling or unable to serve in this role the Pt. next choice is [name]. The Pt. completed a Living Will.  The chaplain gave the original AD to the Pt. along with two copies. The chaplain scanned a copy of the Pt. AD to her EMR.  This chaplain is available for F/U spiritual care as needed.  Chaplain Sallyanne Kuster [phone]

## 2022-08-23 NOTE — Progress Notes (Signed)
   08/23/22 1103  Mobility  Activity Ambulated with assistance in hallway  Level of Assistance Minimal assist, patient does 75% or more  Assistive Device Front wheel walker  Distance Ambulated (ft) 30 ft  Activity Response Tolerated well  Mobility Referral Yes  $Mobility charge 1 Mobility   Mobility Specialist Progress Note  Received pt in bed having no complaints and agreeable to mobility. Pt was asymptomatic throughout ambulation and returned to room w/o fault. Left in bed w/ call bell in reach and all needs met.   Lucious Groves Mobility Specialist

## 2022-08-23 NOTE — Progress Notes (Signed)
Nurse requested Mobility Specialist to perform oxygen saturation test with pt which includes removing pt from oxygen both at rest and while ambulating.  Below are the results from that testing.     Patient Saturations on Room Air at Rest = spO2 96%  Patient Saturations on Room Air while Ambulating = sp02 96% .    Patient Saturations on N/A Liters of oxygen while Ambulating = sp02 N/A%  At end of testing pt left in room on 0  Liters of oxygen.  Reported results to nurse.

## 2022-08-24 DIAGNOSIS — I4892 Unspecified atrial flutter: Secondary | ICD-10-CM | POA: Diagnosis not present

## 2022-08-24 DIAGNOSIS — I5021 Acute systolic (congestive) heart failure: Secondary | ICD-10-CM | POA: Diagnosis not present

## 2022-08-24 DIAGNOSIS — I214 Non-ST elevation (NSTEMI) myocardial infarction: Secondary | ICD-10-CM | POA: Diagnosis not present

## 2022-08-24 DIAGNOSIS — R7989 Other specified abnormal findings of blood chemistry: Secondary | ICD-10-CM | POA: Diagnosis not present

## 2022-08-24 LAB — BASIC METABOLIC PANEL
Anion gap: 7 (ref 5–15)
BUN: 27 mg/dL — ABNORMAL HIGH (ref 8–23)
CO2: 21 mmol/L — ABNORMAL LOW (ref 22–32)
Calcium: 8.8 mg/dL — ABNORMAL LOW (ref 8.9–10.3)
Chloride: 106 mmol/L (ref 98–111)
Creatinine, Ser: 1.32 mg/dL — ABNORMAL HIGH (ref 0.44–1.00)
GFR, Estimated: 40 mL/min — ABNORMAL LOW (ref >60)
Glucose, Bld: 157 mg/dL — ABNORMAL HIGH (ref 70–99)
Potassium: 3.9 mmol/L (ref 3.5–5.1)
Sodium: 134 mmol/L — ABNORMAL LOW (ref 135–145)

## 2022-08-24 LAB — GLUCOSE, CAPILLARY
Glucose-Capillary: 127 mg/dL — ABNORMAL HIGH (ref 70–99)
Glucose-Capillary: 156 mg/dL — ABNORMAL HIGH (ref 70–99)

## 2022-08-24 MED ORDER — CARVEDILOL 3.125 MG PO TABS: 3.1250 mg | ORAL_TABLET | Freq: Two times a day (BID) | ORAL | Status: DC

## 2022-08-24 MED ORDER — SACUBITRIL-VALSARTAN 24-26 MG PO TABS
1.0000 | ORAL_TABLET | Freq: Two times a day (BID) | ORAL | Status: DC
  Administered 2022-08-24: 1 via ORAL
  Filled 2022-08-24: qty 1

## 2022-08-24 MED ORDER — DAPAGLIFLOZIN PROPANEDIOL 10 MG PO TABS: 10.0000 mg | ORAL_TABLET | Freq: Every day | ORAL | 0 refills | Status: DC

## 2022-08-24 MED ORDER — CARVEDILOL 3.125 MG PO TABS: 3.1250 mg | ORAL_TABLET | Freq: Two times a day (BID) | ORAL | 0 refills | Status: DC

## 2022-08-24 MED ORDER — SACUBITRIL-VALSARTAN 24-26 MG PO TABS: 1.0000 | ORAL_TABLET | Freq: Two times a day (BID) | ORAL | 0 refills | Status: DC

## 2022-08-24 NOTE — TOC Transition Note (Signed)
Transition of Care [facility]) - CM/SW Discharge Note   Patient Details  Name: Anna [name] MRN: [id number] Date of Birth: 1936/01/06  [facility] CM/SW Contact:  Carles Collet, RN Phone Number: 08/24/2022, 1:50 PM   Clinical Narrative:     Spoke w patient at bedside to discuss DC needs. She states that her daughter is a PT, she declines additional [place] services. Reviewed equipment at home, she has needed DME, rollator, WC 3/1.  No other TOC needs identified for DC  Final next level of care: Home/Self Care Barriers to Discharge: No Barriers Identified   Patient Goals and CMS Choice Patient states their goals for this hospitalization and ongoing recovery are:: return home      Discharge Placement                       Discharge Plan and Services                DME Arranged: N/A                    Social Determinants of Health (SDOH) Interventions Housing Interventions: Intervention Not Indicated Alcohol Usage Interventions: Intervention Not Indicated (Score <7) Financial Strain Interventions: Intervention Not Indicated   Readmission Risk Interventions     No data to display

## 2022-08-24 NOTE — Progress Notes (Signed)
[facility] Progress Note    Assessment/ Plan:   Anna [name] is a/an 86 y.o. female with a past medical history notable for chronic a flutter on Eliquis, mitral regurtigation/stenosis, HTN, CKD 3a and HLD who presented 10/21 with cough, shortness of breath, chest pain and found to have pneumonia which is thought to have caused stress cardiomyopathy/NSTEMI causing acute HFrEF. Hospitalization has been complicated by AKI and hyponatremia.      Non-Oliguric AKI on CKD 3a:  Suspect cardiorenal syndrome in setting of acute HFrEF. Likely worsened by contrast during cardiac cath. Renal U/S completed 10/24 to r/o structural cause. Scr pleased at 1.8 on 10/25 and has improved to 1.3 today. UA bland.  -AKI should improve further as CHF stabilizes and CIN resolved -Dose meds for GFR<15 -Monitor Daily I/Os, Daily weight  -Maintain MAP>65 for optimal renal perfusion.  -Avoid further nephrotoxins including NSAIDS, Morphine. Unless absolutely necessary, avoid CT with contrast and/or MRI with gadolinium.    -Currently no acute indication for HD   Hypervolemic Hyponatremia 2/2 Acute HFrEF -Started Farxiga 10mg  for management of volume, HF, and CKD. There is also accumulating data for the use of SGLT2is in the setting of acute heart failure particularly in its relation to improving hyponatremia. Data even exists for the use of SGLT2is in hyponatremia without heart failure -She has been diuresing after SGLT2i added and volume is improved.  Will need close f/u to ensure a loop diuretic isn't needed once back in home envt but loks ok for now. -Fluid restriction, 2g sodium -Strict I/O, Daily weight    Anna [name], Anna [name], Anna [name]. Impact of dapagliflozin treatment on serum sodium concentrations in acute heart failure. Cardiorenal Med. 2023 Feb 20. doi: 10.1159/000529614. Epub ahead of print. PMID: [id number].   Acute hypoxic  respiratory failure due to multifocal pneumonia: Based on CT infiltrates with RLL predominance. Urine strep, legionella Ag's neg. S/p CAP tx.     Stress (Takotsubo) cardiomyopathy, NSTEMI, acute HFrEF: Troponin peaked at 5,091. LVEF 25% with WMA's. LHC 10/24 by Anna [name] revealed mild, nonobstructive CAD, elevated LVEDP 2mmHg.  -Holding BB with hx of bradycardia -SGLT2i added 10/26 -Cardiology following --> ok to add back low dose entresto if has close f/u   Chronic atrial flutter: Rate controlled without AV nodal agents -Continue anticoagulation, converted back to eliquis 2.5mg  po BID   NIDT2DM -Per primary team - Continue SSI, augment to resistant scale add HS coverage due to steroid-induced hyperglycemia  Will sign off - nothing further to add.  Appears she'll have close f/u with PCP and cardiology.  She typically sees Anna [name] annually - will be due in 02/2023 - if issues arise nephrology can assist with arise can be requested sooner.    Subjective:   Feeling well today - insistent upon discharge.   Net neg 1.7L again without diuretic   Objective:   BP (!) 146/68 (BP Location: Right Arm)   Pulse 69   Temp 98 F (36.7 C) (Oral)   Resp 20   Ht 5\' 5"  (1.651 m)   Wt 83.6 kg   SpO2 97%   BMI 30.67 kg/m   Intake/Output Summary (Last 24 hours) at 08/24/2022 1103 Last data filed at 08/24/2022 9735 Gross per 24 hour  Intake 360 ml  Output 1850 ml  Net -1490 ml    Weight change: -2.7 kg  Physical Exam: Gen: elderly, alert female, laying in bed in NAD CVS: Irregularly  irregular rhythm, no murmurs, rubs, or gallops.  Resp: Normal work of breathing, few scattered rales to auscultation bilaterally Abd: Soft, non-tender, non-distended Ext: Warm, trace LE edema Psych: Alert, engaged, appropriate mood and affect Neuro: Normal speech, no gross focal deficits   Imaging: No results found.  Labs: BMET Recent Labs  Lab 08/19/22 0043 08/20/22 0132 08/21/22 0227  08/22/22 0052 08/22/22 0741 08/23/22 0124 08/24/22 0040  NA 130* 128* 127* 121* 128* 128* 134*  [name] 4.0 4.2 4.3 4.0 4.2 4.1 3.9  CL 97* 97* 96* 91* 94* 97* 106  CO2 22 19* 19* 18* 19* 19* 21*  GLUCOSE 137* 198* 169* 228* 209* 144* 157*  BUN 29* 32* 39* 43* 42* 34* 27*  CREATININE 1.52* 1.57* 1.78* 1.70* 1.72* 1.33* 1.32*  CALCIUM 8.5* 8.5* 8.8* 8.5* 8.8* 8.9 8.8*  PHOS 3.6  --  4.6  --   --   --   --     CBC Recent Labs  Lab 08/19/22 0043 08/20/22 0132 08/21/22 0227 08/22/22 0052  WBC 11.0* 15.1* 10.9* 10.4  NEUTROABS 7.2  --  9.6*  --   HGB 9.7* 9.2* 9.8* 10.3*  HCT 29.8* 28.8* 29.8* 30.1*  MCV 87.9 89.2 87.4 83.8  PLT 247 227 248 293     Medications:     apixaban  2.5 mg Oral BID   arformoterol  15 mcg Nebulization BID   benzonatate  100 mg Oral BID   budesonide (PULMICORT) nebulizer solution  0.25 mg Nebulization BID   carvedilol  3.125 mg Oral BID WC   dapagliflozin propanediol  10 mg Oral Daily   diclofenac Sodium  2 g Topical QID   docusate sodium  100 mg Oral Daily   ferrous sulfate  325 mg Oral Q lunch   gabapentin  100 mg Oral TID   guaiFENesin  1,200 mg Oral BID   influenza vaccine adjuvanted  0.5 mL Intramuscular Tomorrow-1000   insulin aspart  0-6 Units Subcutaneous TID WC   rosuvastatin  40 mg Oral Daily   sacubitril-valsartan  1 tablet Oral BID   sodium chloride flush  3 mL Intravenous Q12H   sodium chloride flush  3 mL Intravenous Q12H      Anna Bakes MD [facility] Pager [phone]

## 2022-08-24 NOTE — Progress Notes (Signed)
Physical Therapy Treatment Patient Details Name: Anna [name] MRN: [id number] DOB: 11/05/1935 Today's Date: 08/24/2022   History of Present Illness 86 y.o. female presents to [facility] hospital on 08/17/2022 with new onset cough, SOB, nausea and chest pain. Chest x-ray suspicious for PNA. Troponins uptrending with concern for NSTEMI. PMH includes HLD, HTN, mitral regurgitation, OA, PAF.    PT Comments    The pt was agreeable to session and continues to demo slow but steady progress with ambulation endurance and stability. Continues to need physical assist to complete all transfers and to steady with ambulation, but pt reports she has assist needed to return home and is eager to do so. Continue to recommend HHPT to restore strength and independence with transfers.     Recommendations for follow up therapy are one component of a multi-disciplinary discharge planning process, led by the attending physician.  Recommendations may be updated based on patient status, additional functional criteria and insurance authorization.  Follow Up Recommendations  Home health PT     Assistance Recommended at Discharge Frequent or constant Supervision/Assistance  Patient can return home with the following A little help with walking and/or transfers;A little help with bathing/dressing/bathroom;Assistance with cooking/housework;Assist for transportation;Help with stairs or ramp for entrance   Equipment Recommendations  None recommended by PT    Recommendations for Other Services       Precautions / Restrictions Precautions Precautions: Fall Restrictions Weight Bearing Restrictions: No     Mobility  Bed Mobility Overal bed mobility: Needs Assistance             General bed mobility comments: pt OOB in recliner at start and end of session    Transfers Overall transfer level: Needs assistance Equipment used: Rolling walker (2 wheels) Transfers: Sit to/from Stand Sit to Stand: Min assist            General transfer comment: minA to rise and prevent LOB backwards. completed x10 through session from recliner and low toilet    Ambulation/Gait Ambulation/Gait assistance: Min assist Gait Distance (Feet): 15 Feet (+ 15 ft + 15 ft) Assistive device: Rolling walker (2 wheels) Gait Pattern/deviations: Step-through pattern, Decreased step length - right, Decreased step length - left, Shuffle, Trunk flexed Gait velocity: decreased Gait velocity interpretation: <1.31 ft/sec, indicative of household ambulator   General Gait Details: cues for posture and positining in RW, reports limited by fatigue but agreeable to additional walking with encouragement     Balance Overall balance assessment: Needs assistance   Sitting balance-Leahy Scale: Fair       Standing balance-Leahy Scale: Poor Standing balance comment: reliant on RW to ambulate, min guard for static standing                            Cognition Arousal/Alertness: Awake/alert Behavior During Therapy: WFL for tasks assessed/performed Overall Cognitive Status: Within Functional Limits for tasks assessed                                 General Comments: poor awareness, but likely close to baseline        Exercises Other Exercises Other Exercises: repeated sit-stand from recliner x5    General Comments General comments (skin integrity, edema, etc.): VSS on RA      Pertinent Vitals/Pain Pain Assessment Pain Assessment: No/denies pain Faces Pain Scale: No hurt Pain Intervention(s): Monitored during session  PT Goals (current goals can now be found in the care plan section) Acute Rehab PT Goals Patient Stated Goal: to go home PT Goal Formulation: With patient Time For Goal Achievement: 09/02/22 Potential to Achieve Goals: Fair Progress towards PT goals: Progressing toward goals    Frequency    Min 3X/week      PT Plan Current plan remains appropriate       AM-PAC PT "6  Clicks" Mobility   Outcome Measure  Help needed turning from your back to your side while in a flat bed without using bedrails?: A Little Help needed moving from lying on your back to sitting on the side of a flat bed without using bedrails?: A Lot Help needed moving to and from a bed to a chair (including a wheelchair)?: A Little Help needed standing up from a chair using your arms (e.g., wheelchair or bedside chair)?: A Little Help needed to walk in hospital room?: A Little Help needed climbing 3-5 steps with a railing? : A Lot 6 Click Score: 16    End of Session Equipment Utilized During Treatment: Gait belt Activity Tolerance: Patient tolerated treatment well Patient left: in chair;with call bell/phone within reach;with chair alarm set Nurse Communication: Mobility status PT Visit Diagnosis: Muscle weakness (generalized) (M62.81);Pain;Difficulty in walking, not elsewhere classified (R26.2) Pain - part of body: Knee     Time: 1240-1259 PT Time Calculation (min) (ACUTE ONLY): 19 min  Charges:  $Therapeutic Exercise: 8-22 mins                     [place] Carbo, PT, DPT   Acute Rehabilitation Department   Sandra Cockayne 08/24/2022, 2:06 PM

## 2022-08-24 NOTE — Progress Notes (Signed)
Discharge to home transported by grand daughter Tereso Newcomer.  Belongings sent with pt. Dentures and etc. D/c instructions and ff up appts discussed with pt and grand daughter.

## 2022-08-24 NOTE — Progress Notes (Signed)
Progress Note  Patient Name: Anna [name] Date of Encounter: 08/24/2022  Primary Cardiologist: Fransico Him, MD   Subjective   Overnight kidney continues to improve. Patient notes that she feels back to normal. She was told that she is ready to go home. She has a birthday party she much go to.  She isn't sure what she will dress up as. No CP, SOB, Palpitations.  Inpatient Medications    Scheduled Meds:  apixaban  2.5 mg Oral BID   arformoterol  15 mcg Nebulization BID   benzonatate  100 mg Oral BID   budesonide (PULMICORT) nebulizer solution  0.25 mg Nebulization BID   dapagliflozin propanediol  10 mg Oral Daily   diclofenac Sodium  2 g Topical QID   docusate sodium  100 mg Oral Daily   ferrous sulfate  325 mg Oral Q lunch   gabapentin  100 mg Oral TID   guaiFENesin  1,200 mg Oral BID   influenza vaccine adjuvanted  0.5 mL Intramuscular Tomorrow-1000   insulin aspart  0-6 Units Subcutaneous TID WC   rosuvastatin  40 mg Oral Daily   sodium chloride flush  3 mL Intravenous Q12H   sodium chloride flush  3 mL Intravenous Q12H   Continuous Infusions:  sodium chloride Stopped (08/22/22 2329)   PRN Meds: sodium chloride, acetaminophen, albuterol, cyclobenzaprine, HYDROcodone-acetaminophen, menthol-cetylpyridinium, morphine injection, nitroGLYCERIN, ondansetron (ZOFRAN) IV, sodium chloride flush   Vital Signs    Vitals:   08/24/22 0025 08/24/22 0421 08/24/22 0735 08/24/22 0759  BP: (!) 140/53 (!) 144/58 (!) 146/68   Pulse: 78 77 69   Resp: 19 19 20    Temp: 98.1 F (36.7 C) 98 F (36.7 C) 98 F (36.7 C)   TempSrc: Oral Oral Oral   SpO2: 98% 96% 98% 97%  Weight:  83.6 kg    Height:        Intake/Output Summary (Last 24 hours) at 08/24/2022 0901 Last data filed at 08/24/2022 0430 Gross per 24 hour  Intake 240 ml  Output 1550 ml  Net -1310 ml   Filed Weights   08/22/22 0237 08/23/22 0001 08/24/22 0421  Weight: 85 kg 86.3 kg 83.6 kg    Telemetry     Atrial fibrillation - Personally Reviewed  Physical Exam   Gen: no distress Cardiac: No Rubs or Gallops, no murmur, IRIR +2 radial pulses Respiratory: coarse in bases, normal effort, normal  respiratory rate GI: Soft, nontender, non-distended  MS: non pitting edema;  moves all extremities Integument: Skin feels warm Neuro:  At time of evaluation, alert and oriented to person/place/time/situation  Psych: Normal affect, patient feels great   Labs    Chemistry Recent Labs  Lab 08/17/22 1028 08/18/22 0038 08/19/22 2671 08/20/22 0132 08/21/22 2458 08/22/22 0052 08/22/22 0741 08/23/22 0124 08/24/22 0040  NA 136   < > 130*   < > 127*   < > 128* 128* 134*  K 4.1   < > 4.0   < > 4.3   < > 4.2 4.1 3.9  CL 99   < > 97*   < > 96*   < > 94* 97* 106  CO2 25   < > 22   < > 19*   < > 19* 19* 21*  GLUCOSE 152*   < > 137*   < > 169*   < > 209* 144* 157*  BUN 13   < > 29*   < > 39*   < > 42*  34* 27*  CREATININE 0.89   < > 1.52*   < > 1.78*   < > 1.72* 1.33* 1.32*  CALCIUM 9.4   < > 8.5*   < > 8.8*   < > 8.8* 8.9 8.8*  PROT 6.9  --  6.0*  --  6.2*  --   --   --   --   ALBUMIN 3.4*  --  2.8*  --  2.8*  --   --   --   --   AST 24  --  49*  --  55*  --   --   --   --   ALT 13  --  23  --  37  --   --   --   --   ALKPHOS 114  --  92  --  93  --   --   --   --   BILITOT 0.9  --  0.7  --  0.6  --   --   --   --   GFRNONAA >60   < > 34*   < > 28*   < > 29* 39* 40*  ANIONGAP 12   < > 11   < > 12   < > 15 12 7    < > = values in this interval not displayed.     Hematology Recent Labs  Lab 08/20/22 0132 08/21/22 0227 08/22/22 0052  WBC 15.1* 10.9* 10.4  RBC 3.23* 3.41* 3.59*  HGB 9.2* 9.8* 10.3*  HCT 28.8* 29.8* 30.1*  MCV 89.2 87.4 83.8  MCH 28.5 28.7 28.7  MCHC 31.9 32.9 34.2  RDW 13.6 13.8 13.6  PLT 227 248 293    Cardiac EnzymesNo results for input(s): "TROPONINI" in the last 168 hours. No results for input(s): "TROPIPOC" in the last 168 hours.   BNP Recent Labs  Lab  08/17/22 1440  BNP 697.2*     DDimer No results for input(s): "DDIMER" in the last 168 hours.   Radiology    No results found.  Cardiac Studies   Cardiac Studies & Procedures   CARDIAC CATHETERIZATION  CARDIAC CATHETERIZATION 08/20/2022  Narrative   LV end diastolic pressure is mildly elevated.  LVEDP 20 mm Hg.   There is no aortic valve stenosis.   Mild, nonobstructive CAD.  Likely Takotsubo cardiomyopathy based on the RWMA noted on echo.  Results conveyed to her granddaughter, [name].  Findings Coronary Findings Diagnostic  Dominance: Right  Left Anterior Descending The vessel exhibits minimal luminal irregularities.  Intervention  No interventions have been documented.     ECHOCARDIOGRAM  ECHOCARDIOGRAM COMPLETE 08/18/2022  Narrative ECHOCARDIOGRAM REPORT    Patient Name:   Anna [name] Date of Exam: 08/18/2022 Medical Rec #:  [id number]      Height:       65.0 in Accession #:    [id number]     Weight:       177.2 lb Date of Birth:  [birth year]      BSA:          1.879 m Patient Age:    86 years       BP:           113/69 mmHg Patient Gender: F              HR:           60 bpm. Exam Location:  Inpatient  Procedure: 2D Echo, Color Doppler, Cardiac Doppler and Intracardiac Opacification Agent  Indications:    NSTEMI i21.4  History:        Patient has prior history of Echocardiogram examinations, most recent 05/06/2022. Arrythmias:Atrial Fibrillation; Risk Factors:Hypertension and Dyslipidemia.  Sonographer:    Raquel Sarna Senior RDCS Referring Phys: [id number] [place]   1. Left ventricular ejection fraction, by estimation, is 25%. The left ventricle has severely decreased function. The left ventricle demonstrates regional wall motion abnormalities (see scoring diagram/findings for description). Left ventricular diastolic parameters are indeterminate. 2. Right ventricular systolic function is mildly reduced. The right ventricular size is  normal. There is mildly elevated pulmonary artery systolic pressure. 3. Left atrial size was moderately dilated. 4. Right atrial size was moderately dilated. 5. The mitral valve is abnormal. Mild mitral valve regurgitation. No evidence of mitral stenosis. 6. The tricuspid valve is abnormal. Tricuspid valve regurgitation is mild to moderate. 7. The aortic valve has an indeterminant number of cusps. There is mild calcification of the aortic valve. There is mild thickening of the aortic valve. Aortic valve regurgitation is not visualized. No aortic stenosis is present. 8. The inferior vena cava is dilated in size with <50% respiratory variability, suggesting right atrial pressure of 15 mmHg.  FINDINGS Left Ventricle: Left ventricular ejection fraction, by estimation, is 25%. The left ventricle has severely decreased function. The left ventricle demonstrates regional wall motion abnormalities. The left ventricular internal cavity size was normal in size. There is no left ventricular hypertrophy. Left ventricular diastolic parameters are indeterminate.   LV Wall Scoring: The mid and distal anterior septum and entire apex are akinetic.  Right Ventricle: The right ventricular size is normal. Right vetricular wall thickness was not well visualized. Right ventricular systolic function is mildly reduced. There is mildly elevated pulmonary artery systolic pressure. The tricuspid regurgitant velocity is 2.38 m/s, and with an assumed right atrial pressure of 15 mmHg, the estimated right ventricular systolic pressure is 0000000 mmHg.  Left Atrium: Left atrial size was moderately dilated.  Right Atrium: Right atrial size was moderately dilated.  Pericardium: There is no evidence of pericardial effusion.  Mitral Valve: The mitral valve is abnormal. There is mild thickening of the mitral valve leaflet(s). There is mild calcification of the mitral valve leaflet(s). Mild mitral annular calcification. Mild mitral  valve regurgitation. No evidence of mitral valve stenosis. MV peak gradient, 10.5 mmHg. The mean mitral valve gradient is 2.7 mmHg.  Tricuspid Valve: The tricuspid valve is abnormal. Tricuspid valve regurgitation is mild to moderate. No evidence of tricuspid stenosis.  Aortic Valve: The aortic valve has an indeterminant number of cusps. There is mild calcification of the aortic valve. There is mild thickening of the aortic valve. There is mild aortic valve annular calcification. Aortic valve regurgitation is not visualized. No aortic stenosis is present. Aortic valve mean gradient measures 3.1 mmHg. Aortic valve peak gradient measures 6.2 mmHg. Aortic valve area, by VTI measures 2.44 cm.  Pulmonic Valve: The pulmonic valve was not well visualized. Pulmonic valve regurgitation is not visualized. No evidence of pulmonic stenosis.  Aorta: The aortic root is normal in size and structure.  Venous: The inferior vena cava is dilated in size with less than 50% respiratory variability, suggesting right atrial pressure of 15 mmHg.  IAS/Shunts: The interatrial septum was not well visualized.   LEFT VENTRICLE PLAX 2D LVIDd:         4.60 cm      Diastology LVIDs:         2.60 cm  LV e' medial:  4.90 cm/s LV PW:         1.00 cm      LV e' lateral: 7.94 cm/s LV IVS:        0.80 cm LVOT diam:     1.80 cm LV SV:         69 LV SV Index:   37 LVOT Area:     2.54 cm  LV Volumes (MOD) LV vol d, MOD A2C: 115.0 ml LV vol d, MOD A4C: 90.0 ml LV vol s, MOD A2C: 68.4 ml LV vol s, MOD A4C: 52.4 ml LV SV MOD A2C:     46.6 ml LV SV MOD A4C:     90.0 ml LV SV MOD BP:      41.9 ml  RIGHT VENTRICLE RV S prime:     7.83 cm/s TAPSE (M-mode): 1.5 cm  LEFT ATRIUM             Index        RIGHT ATRIUM           Index LA diam:        5.00 cm 2.66 cm/m   RA Area:     26.30 cm LA Vol (A2C):   85.6 ml 45.55 ml/m  RA Volume:   81.40 ml  43.32 ml/m LA Vol (A4C):   73.8 ml 39.27 ml/m LA Biplane Vol: 80.5  ml 42.84 ml/m AORTIC VALVE AV Area (Vmax):    2.11 cm AV Area (Vmean):   2.26 cm AV Area (VTI):     2.44 cm AV Vmax:           124.10 cm/s AV Vmean:          84.326 cm/s AV VTI:            0.282 m AV Peak Grad:      6.2 mmHg AV Mean Grad:      3.1 mmHg LVOT Vmax:         103.00 cm/s LVOT Vmean:        74.900 cm/s LVOT VTI:          0.270 m LVOT/AV VTI ratio: 0.96  AORTA Ao Root diam: 3.20 cm Ao Asc diam:  3.20 cm  MITRAL VALVE              TRICUSPID VALVE MV Area (PHT): 1.47 cm   TR Peak grad:   22.7 mmHg MV Area VTI:   1.53 cm   TR Vmax:        238.00 cm/s MV Peak grad:  10.5 mmHg MV Mean grad:  2.7 mmHg   SHUNTS MV Vmax:       1.62 m/s   Systemic VTI:  0.27 m MV Vmean:      69.5 cm/s  Systemic Diam: 1.80 cm  Carlyle Dolly MD Electronically signed by Carlyle Dolly MD Signature Date/Time: 08/18/2022/1:46:08 PM    Final              Patient Profile     86 y.o. female NSTEMI and Stress induced cardiomyopathy complicated by AKI  Assessment & Plan     NSTEMI Stress Induced Cardiomyopathy With AKI on CKD stage 3a and with hyponatremia (improving) - continue SGLT2i - will add back low dose Entresto after seeing Nephrology recommendations - given this, will not add back lasix this AM unless planned with nephrology as the ARNI should decongestion some - MRA as outpatient  - stop ARB as outpatient, may  not need norvasc 10 mg   Long standing persistent Atrial Flutter - continue eliquis - will add very low dose coreg 3.125; there have been questions of bradycardia history and wheezing: she has never smoked; and has no definitive history of COPD; will trial low dose and monitor for symptomatib bradycardia  CAC - continue statin, LDL goal < 70, outpatiennt  PNA - as per primary  She is adamant she is leaving today: if so need PCP f/u quickly with BMP give her NA and Creatinine; will arrange cardiology f/u and would stop CCB and ARB as outpatient  For  questions or updates, please contact [facility] Please consult [email or web address] for contact info under Cardiology/STEMI.      Rudean Haskell, MD [address] [place], [place] [address] [phone]  9:01 AM

## 2022-08-24 NOTE — Discharge Summary (Signed)
Physician Discharge Summary   Patient: Anna [name] MRN: [id number] DOB: Mar 23, 1936  Admit date:     08/17/2022  Discharge date: 08/24/22  Discharge Physician: Patrecia Pour   PCP: [facility]  Recommendations at discharge:   Follow up with cardiology as scheduled for new HF. Discharged on: Coreg 3.148m po BID Low dose entresto Farxiga Stopped norvasc due to soft BP with these agents Consider addition of spironolactone as outpatient, monitor for bradycardia. May also need lasix, though is diuresing well with SGLT2i. Recheck BMP at follow up Follow up with PCP in next 1-2 weeks Needs outpatient follow up for mediastinal and right hilar adenopathy, pulmonary nodules with CT chest in 3-6 months Needs outpatient pelvic U/S for enlarged L ovary. D/w pt.  CT also noted heterogenicity and fat stranding of R pericolic gutter peritoneum for which "attention at follow up" is recommended.  Discharge Diagnoses: Principal Problem:   NSTEMI (non-ST elevated myocardial infarction) ([place] Active Problems:   Chronic atrial flutter (HCC)   Acute heart failure ([place]   Elevated troponin  Hospital Course: Anna [name] an 86y.o. female with medical history significant of chronic a flutter on Eliquis, mitral regurgitation/stenosis, HTN, and HLD who presented 10/21 with cough, shortness of breath, chest pain found to have pneumonia thought to have caused stress cardiomyopathy/NSTEMI causing acute HFrEF. Diuresis limited by AKI. Hospitalization also complicated by hyponatremia which has improved with SGLT2i per nephrology consultant. The patient has completed antibiotic treatment with resolution of hypoxia, and demonstrates improved volume status. GDMT initiation/titration was ongoing, though the patient adamantly requested discharge on 10/28. Due to her hemodynamic stability, she will be discharged with return precautions and reinforced need for close follow  up.  Assessment and Plan: Acute hypoxic respiratory failure due to multifocal pneumonia: Based on CT infiltrates with RLL predominance. Urine strep, legionella Ag's neg. Blood cultures NG5D, sputum culture normal flora. Leukocytosis has resolved. Completed ceftriaxone, doxycycline (10/1 - 10/27) - Discussed with patient the need for repeat CT chest in 3-6 months.   Stress (Takotsubo) cardiomyopathy, NSTEMI, acute HFrEF: Troponin peaked at 5,091. LVEF 25% with WMA's. LHC 10/24 by Dr. VIrish Lackrevealed mild, nonobstructive CAD, elevated LVEDP, 233mg.  - SGLT2i added 10/26, adding entresto 10/28. GDMT initiation per cardiology. Would benefit from additional diuresis, though has stable and improving labs and ongoing diuresis, cardiology has arranged close follow up. Will discharge without lasix for now.  - We were holding beta blocker with hx bradycardia, though cardiology started very low dose coreg, will need HR monitoring at follow up. - Consider spiro once renal function improved. - Monitor I/O, daily weights. Weight at DC 83.6kg, though suspect EDW is lower.   AKI on stage IIIa CKD: SCr up 1.1 >> 1.5 > 1.78 >> 1.32, suspect cardiorenal syndrome. Also had contrast with cath as well as initiation of ARNI and lasix on 10/24. U/S without hydronephrosis, UA bland. - Improved with diuresis by SGLT2i started by nephrology 10/26, will continue.   - Monitor BMP at follow up  - [facility]   Hyponatremia: Improved with SGLT2i, still suspect hypervolemic etiology. Up to 134 on day of discharge. - Appreciate nephrology assistance, continue fluid restriction.    Chronic atrial flutter: Rate controlled without AV nodal agents - Continue anticoagulation, converted back to eliquis 2.38m90mo BID (due to age and CrCl)   Goals of care: Pt wishes to remain full code during this hospitalization. This is at  odds with her professed desire not to undergo CPR in the  event of cardiorespiratory arrest. Palliative care met with patient and family formally 10/25.    Abnormal imaging findings:  - Needs outpatient follow up for mediastinal and right hilar adenopathy, pulmonary nodules with CT chest in 3-6 months - Needs outpatient pelvic U/S for enlarged L ovary. D/w pt.  - CT also noted heterogenicity and fat stranding of R pericolic gutter peritoneum for which "attention at follow up" is recommended.   NIDT2DM: HbA1c 5.8%.  - Outpatient follow up.   HTN: - Medication changes as above, stopped norvasc.  Patient declined home health, has DME already.  Consultants: Cardiology, nephrology Procedures performed: None  Disposition: Home Diet recommendation:  Cardiac diet DISCHARGE MEDICATION: Allergies as of 08/24/2022   No Known Allergies      Medication List     STOP taking these medications    amlodipine-olmesartan 10-20 MG tablet Commonly known as: AZOR   furosemide 20 MG tablet Commonly known as: LASIX       TAKE these medications    apixaban 5 MG Tabs tablet Commonly known as: ELIQUIS Take 1 tablet (5 mg total) by mouth 2 (two) times daily.   carvedilol 3.125 MG tablet Commonly known as: COREG Take 1 tablet (3.125 mg total) by mouth 2 (two) times daily with a meal.   cyclobenzaprine 10 MG tablet Commonly known as: FLEXERIL Take 0.5 tablets (5 mg total) by mouth 2 (two) times daily as needed for muscle spasms.   dapagliflozin propanediol 10 MG Tabs tablet Commonly known as: FARXIGA Take 1 tablet (10 mg total) by mouth daily. Start taking on: August 25, 2022   diclofenac Sodium 1 % Gel Commonly known as: VOLTAREN Apply 2 grams to knees 4 times a day as needed. What changed:  how much to take how to take this when to take this reasons to take this additional instructions   docusate sodium 100 MG capsule Commonly known as: COLACE Take 100 mg by mouth daily.   ferrous sulfate 325 (65 FE) MG tablet TAKE 1 TABLET BY  MOUTH EVERY DAY WITH BREAKFAST What changed: See the new instructions.   FISH OIL CONCENTRATE PO Take 1 capsule by mouth daily.   gabapentin 100 MG capsule Commonly known as: NEURONTIN TAKE 1 CAPSULE BY MOUTH THREE TIMES A DAY What changed: See the new instructions.   HYDROcodone-acetaminophen 5-325 MG tablet Commonly known as: NORCO/VICODIN Take 1 tablet by mouth every 6 (six) hours as needed for moderate pain.   metFORMIN 500 MG tablet Commonly known as: GLUCOPHAGE TAKE 1 TABLET BY MOUTH EVERY DAY WITH BREAKFAST What changed: See the new instructions.   Misc. Production manager. Devices Misc Rollator walker   Misc. [facility] bed   rosuvastatin 10 MG tablet Commonly known as: CRESTOR TAKE 1 TABLET BY MOUTH EVERY DAY What changed:  how much to take how to take this when to take this additional instructions   sacubitril-valsartan 24-26 MG Commonly known as: ENTRESTO Take 1 tablet by mouth 2 (two) times daily.   Systane Nighttime Oint Place 1 drop into the left eye 4 (four) times daily.   VITAMIN D PO Take 1 tablet by mouth daily.   VITAMIN D3 PO Take 1 tablet by mouth daily.        Follow-up [place] Follow up.   Contact information: [place]  [place] [address] [facility]. Go on 09/04/2022.   Specialty: Cardiology Why: Hospital follow up PLEASE bring a current medication list to appointment FREE valet parking, Entrance C, off [employer] information: [address] [id number] [place] [phone]               Discharge Exam: Danley Danker Weights   08/22/22 0237 08/23/22 0001 08/24/22 0421  Weight: 85 kg 86.3 kg 83.6 kg  146/68 / 62bpm / 20/min 97% on room air No distress Irreg irreg, 1+ dependent  edema Clear, nonlabored   Condition at discharge: stable  The results of significant diagnostics from this hospitalization (including imaging, microbiology, ancillary and laboratory) are listed below for reference.   Imaging Studies: US RENAL  Result Date: 08/20/2022 CLINICAL DATA:  Acute renal injury EXAM: RENAL / URINARY TRACT ULTRASOUND COMPLETE COMPARISON:  01/19/2020, CT from 08/17/2022 FINDINGS: Right Kidney: Renal measurements: 9.1 x 3.7 x 4.3 cm. = volume: 76 mL. 2.5 cm upper pole cyst is noted simple in nature. No follow-up is recommended. No mass lesion or hydronephrosis is noted. Left Kidney: Renal measurements: 9.5 x 4.4 x 4.3 cm. = volume: 95 mL. Echogenicity within normal limits. No mass or hydronephrosis visualized. Bladder: Not well visualized. Other: None. IMPRESSION: Simple right renal cyst stable in appearance from the prior exam. No acute abnormality noted. Electronically Signed   By: Inez Catalina M.D.   On: 08/20/2022 22:43   CARDIAC CATHETERIZATION  Result Date: 05/30/2335   LV end diastolic pressure is mildly elevated.  LVEDP 20 mm Hg.   There is no aortic valve stenosis.   Mild, nonobstructive CAD. Likely Takotsubo cardiomyopathy based on the RWMA noted on echo.  Results conveyed to her granddaughter, Anna [name].   DG CHEST PORT 1 VIEW  Result Date: 08/19/2022 CLINICAL DATA:  Shortness of breath. EXAM: PORTABLE CHEST 1 VIEW COMPARISON:  08/17/2022 FINDINGS: The cardio pericardial silhouette is enlarged. Bibasilar airspace disease again noted without substantial interval change. The visualized bony structures of the thorax are unremarkable. Telemetry leads overlie the chest. IMPRESSION: No substantial interval change. Bibasilar airspace disease. Electronically Signed   By: Misty Stanley M.D.   On: 08/19/2022 16:06   ECHOCARDIOGRAM COMPLETE  Result Date: 08/18/2022    ECHOCARDIOGRAM REPORT   Patient Name:   Anna [name] Date of Exam: 08/18/2022 Medical Rec #:  [id number]       Height:       65.0 in Accession #:    [id number]     Weight:       177.2 lb Date of Birth:  01-12-1936      BSA:          1.879 m Patient Age:    45 years       BP:           113/69 mmHg Patient Gender: F              HR:           60 bpm. Exam Location:  Inpatient Procedure: 2D Echo, Color Doppler, Cardiac Doppler and Intracardiac            Opacification Agent Indications:    NSTEMI i21.4  History:        Patient has prior history of Echocardiogram examinations, most                 recent 05/06/2022.  Arrythmias:Atrial Fibrillation; Risk                 Factors:Hypertension and Dyslipidemia.  Sonographer:    Raquel Sarna Senior RDCS Referring Phys: [id number] [place]  1. Left ventricular ejection fraction, by estimation, is 25%. The left ventricle has severely decreased function. The left ventricle demonstrates regional wall motion abnormalities (see scoring diagram/findings for description). Left ventricular diastolic parameters are indeterminate.  2. Right ventricular systolic function is mildly reduced. The right ventricular size is normal. There is mildly elevated pulmonary artery systolic pressure.  3. Left atrial size was moderately dilated.  4. Right atrial size was moderately dilated.  5. The mitral valve is abnormal. Mild mitral valve regurgitation. No evidence of mitral stenosis.  6. The tricuspid valve is abnormal. Tricuspid valve regurgitation is mild to moderate.  7. The aortic valve has an indeterminant number of cusps. There is mild calcification of the aortic valve. There is mild thickening of the aortic valve. Aortic valve regurgitation is not visualized. No aortic stenosis is present.  8. The inferior vena cava is dilated in size with <50% respiratory variability, suggesting right atrial pressure of 15 mmHg. FINDINGS  Left Ventricle: Left ventricular ejection fraction, by estimation, is 25%. The left ventricle has severely decreased function. The left ventricle demonstrates regional wall  motion abnormalities. The left ventricular internal cavity size was normal in size. There is no left ventricular hypertrophy. Left ventricular diastolic parameters are indeterminate.  LV Wall Scoring: The mid and distal anterior septum and entire apex are akinetic. Right Ventricle: The right ventricular size is normal. Right vetricular wall thickness was not well visualized. Right ventricular systolic function is mildly reduced. There is mildly elevated pulmonary artery systolic pressure. The tricuspid regurgitant velocity is 2.38 m/s, and with an assumed right atrial pressure of 15 mmHg, the estimated right ventricular systolic pressure is 02.6 mmHg. Left Atrium: Left atrial size was moderately dilated. Right Atrium: Right atrial size was moderately dilated. Pericardium: There is no evidence of pericardial effusion. Mitral Valve: The mitral valve is abnormal. There is mild thickening of the mitral valve leaflet(s). There is mild calcification of the mitral valve leaflet(s). Mild mitral annular calcification. Mild mitral valve regurgitation. No evidence of mitral valve stenosis. MV peak gradient, 10.5 mmHg. The mean mitral valve gradient is 2.7 mmHg. Tricuspid Valve: The tricuspid valve is abnormal. Tricuspid valve regurgitation is mild to moderate. No evidence of tricuspid stenosis. Aortic Valve: The aortic valve has an indeterminant number of cusps. There is mild calcification of the aortic valve. There is mild thickening of the aortic valve. There is mild aortic valve annular calcification. Aortic valve regurgitation is not visualized. No aortic stenosis is present. Aortic valve mean gradient measures 3.1 mmHg. Aortic valve peak gradient measures 6.2 mmHg. Aortic valve area, by VTI measures 2.44 cm. Pulmonic Valve: The pulmonic valve was not well visualized. Pulmonic valve regurgitation is not visualized. No evidence of pulmonic stenosis. Aorta: The aortic root is normal in size and structure. Venous: The  inferior vena cava is dilated in size with less than 50% respiratory variability, suggesting right atrial pressure of 15 mmHg. IAS/Shunts: The interatrial septum was not well visualized.  LEFT VENTRICLE PLAX 2D LVIDd:         4.60 cm      Diastology LVIDs:         2.60 cm      LV e' medial:  4.90 cm/s LV PW:  1.00 cm      LV e' lateral: 7.94 cm/s LV IVS:        0.80 cm LVOT diam:     1.80 cm LV SV:         69 LV SV Index:   37 LVOT Area:     2.54 cm  LV Volumes (MOD) LV vol d, MOD A2C: 115.0 ml LV vol d, MOD A4C: 90.0 ml LV vol s, MOD A2C: 68.4 ml LV vol s, MOD A4C: 52.4 ml LV SV MOD A2C:     46.6 ml LV SV MOD A4C:     90.0 ml LV SV MOD BP:      41.9 ml RIGHT VENTRICLE RV S prime:     7.83 cm/s TAPSE (M-mode): 1.5 cm LEFT ATRIUM             Index        RIGHT ATRIUM           Index LA diam:        5.00 cm 2.66 cm/m   RA Area:     26.30 cm LA Vol (A2C):   85.6 ml 45.55 ml/m  RA Volume:   81.40 ml  43.32 ml/m LA Vol (A4C):   73.8 ml 39.27 ml/m LA Biplane Vol: 80.5 ml 42.84 ml/m  AORTIC VALVE AV Area (Vmax):    2.11 cm AV Area (Vmean):   2.26 cm AV Area (VTI):     2.44 cm AV Vmax:           124.10 cm/s AV Vmean:          84.326 cm/s AV VTI:            0.282 m AV Peak Grad:      6.2 mmHg AV Mean Grad:      3.1 mmHg LVOT Vmax:         103.00 cm/s LVOT Vmean:        74.900 cm/s LVOT VTI:          0.270 m LVOT/AV VTI ratio: 0.96  AORTA Ao Root diam: 3.20 cm Ao Asc diam:  3.20 cm MITRAL VALVE              TRICUSPID VALVE MV Area (PHT): 1.47 cm   TR Peak grad:   22.7 mmHg MV Area VTI:   1.53 cm   TR Vmax:        238.00 cm/s MV Peak grad:  10.5 mmHg MV Mean grad:  2.7 mmHg   SHUNTS MV Vmax:       1.62 m/s   Systemic VTI:  0.27 m MV Vmean:      69.5 cm/s  Systemic Diam: 1.80 cm Carlyle Dolly MD Electronically signed by Carlyle Dolly MD Signature Date/Time: 08/18/2022/1:46:08 PM    Final    CT CHEST ABDOMEN PELVIS W CONTRAST  Result Date: 08/17/2022 CLINICAL DATA:  abdominal pain, pneumonia EXAM: CT  CHEST, ABDOMEN, AND PELVIS WITH CONTRAST TECHNIQUE: Multidetector CT imaging of the chest, abdomen and pelvis was performed following the standard protocol during bolus administration of intravenous contrast. RADIATION DOSE REDUCTION: This exam was performed according to the departmental dose-optimization program which includes automated exposure control, adjustment of the mA and/or kV according to patient size and/or use of iterative reconstruction technique. CONTRAST:  110m OMNIPAQUE IOHEXOL 350 MG/ML SOLN COMPARISON:  Chest x-ray 03/19/2021 FINDINGS: CT CHEST FINDINGS Cardiovascular: Enlarged heart size. No significant pericardial effusion. The thoracic aorta is normal in caliber. Mild-to-moderate  atherosclerotic plaque of the thoracic aorta. Four-vessel coronary artery calcifications. Mitral annular calcification. The main pulmonary artery measures at the upper limits of normal. No central or segmental pulmonary embolus. Mediastinum/Nodes: Right hilar lymphadenopathy measuring up to 1.3 cm (3:27). Mediastinal lymphadenopathy with as an example a precarinal 1.4 cm lymph node (3:22). No enlarged mediastinal, hilar, or axillary lymph nodes. Thyroid gland, trachea, and esophagus demonstrate no significant findings. Lungs/Pleura: Interlobular septal wall thickening. Bilateral lower lobe peribronchovascular ground-glass airspace opacity and consolidation, right greater than left. Right upper lobe linear atelectasis versus scarring for. Right upper lobe 6 mm pulmonary nodule. Right upper lobe 1.2 x 0.7 cm pulmonary nodule. Scattered right upper lobe peribronchovascular ground-glass airspace opacities. No pulmonary mass. Trace bilateral pleural effusions. No pneumothorax. Musculoskeletal: No chest wall abnormality. No suspicious lytic or blastic osseous lesions. Chronic T12 anterior wedge compression fracture with at least 20% vertebral body height loss. Multilevel degenerative changes of the spine. CT ABDOMEN PELVIS  FINDINGS Hepatobiliary: No focal liver abnormality. Layering hyperdensity within the gallbladder lumen likely calcified gallstones noted within the gallbladder lumen. No gallbladder wall thickening or pericholecystic fluid. No biliary dilatation. Pancreas: No focal lesion. Normal pancreatic contour. No surrounding inflammatory changes. No main pancreatic ductal dilatation. Spleen: Normal in size without focal abnormality. Adrenals/Urinary Tract: No adrenal nodule bilaterally. Bilateral kidneys enhance symmetrically. Right renal cortical scarring. Couple of fluid density lesions within the right kidney likely represent simple renal cysts. Simple renal cysts, in the absence of clinically indicated signs/symptoms, require no independent follow-up. Subcentimeter hypodensities are too small to characterize. No hydronephrosis. No hydroureter. The urinary bladder is unremarkable. Stomach/Bowel: Stomach is within normal limits. No evidence of bowel wall thickening or dilatation. Colonic diverticulosis with no acute diverticulitis. Appendix appears normal. Vascular/Lymphatic: No abdominal aorta or iliac aneurysm. At least moderate atherosclerotic plaque of the aorta and its branches. No abdominal, pelvic, or inguinal lymphadenopathy. Reproductive: Slightly heterogeneous, enhancing, and enlarged left ovary measuring 3.8 x 2.5 cm of unclear etiology. Uterus and bilateral adnexa are unremarkable. Other: No intraperitoneal free fluid. No intraperitoneal free gas. No organized fluid collection. Vague slight heterogeneity and fat stranding of the right pericolic gutter peritoneum of unclear etiology (3:70 4-78). Musculoskeletal: No abdominal wall hernia or abnormality. No suspicious lytic or blastic osseous lesions. No acute displaced fracture. Multilevel degenerative changes of the spine most prominent at the L3-L4 level. Mild retrolisthesis of L1 on L2 and L2 on L3. IMPRESSION: 1. Cardiomegaly. 2. Multifocal pneumonia most  prominent within the right lower lobe. 3. Trace bilateral pleural effusions. 4. Mediastinal and right hilar lymphadenopathy likely reactive in etiology. Recommend attention on follow-up. 5. A 1.2 cm x 0.7 cm and a 0.6 cm right upper lobe pulmonary nodules. Non-contrast chest CT at 3-6 months is recommended. If the nodules are stable at time of repeat CT, then future CT at 18-24 months (from today's scan) is considered optional for low-risk patients, but is recommended for high-risk patients. This recommendation follows the consensus statement: Guidelines for Management of Incidental Pulmonary Nodules Detected on CT Images: From the Fleischner Society 2017; Radiology 2017; 284:228-243. 6. Slightly heterogeneous, hyperdense, and enlarged left ovary measuring 3.8 x 2.5 cm of unclear etiology. Recommend pelvic ultrasound for further evaluation. 7. Vague slight heterogeneity and fat stranding of the right pericolic gutter peritoneum of unclear etiology. Recommend correlation with prior cross-sectional imaging. Attention on follow-up. 8. Cholelithiasis with no CT evidence of acute cholecystitis. 9. Aortic Atherosclerosis (ICD10-I70.0) including four-vessel coronary calcification and mitral annular calcification. Electronically Signed  By: Iven Finn M.D.   On: 08/17/2022 18:52   DG Chest 2 View  Result Date: 08/17/2022 CLINICAL DATA:  Pneumonia EXAM: CHEST - 2 VIEW COMPARISON:  CXR 07/19/21 FINDINGS: No pleural effusion. No pneumothorax. Enlarged cardiac contours. Normal mediastinal contours. No displaced rib fractures. Visualized upper abdomen is unremarkable. There is a hazy opacity at the right lung base superimposed on a background of prominent interstitial opacities. Vertebral body heights are maintained. IMPRESSION: Hazy opacity at the right lung base superimposed on a background of prominent interstitial opacities. Findings could represent pneumonia on a background of mild pulmonary edema. In the  appropriate clinical setting. Electronically Signed   By: Marin Roberts M.D.   On: 08/17/2022 11:22    Microbiology: Results for orders placed or performed during the hospital encounter of 08/17/22  Resp Panel by RT-PCR (Flu A&B, Covid) Anterior Nasal Swab     Status: None   Collection Time: 08/17/22 10:09 AM   Specimen: Anterior Nasal Swab  Result Value Ref Range Status   SARS Coronavirus 2 by RT PCR NEGATIVE NEGATIVE Final    Comment: (NOTE) SARS-CoV-2 target nucleic acids are NOT DETECTED.  The SARS-CoV-2 RNA is generally detectable in upper respiratory specimens during the acute phase of infection. The lowest concentration of SARS-CoV-2 viral copies this assay can detect is 138 copies/mL. A negative result does not preclude SARS-Cov-2 infection and should not be used as the sole basis for treatment or other patient management decisions. A negative result may occur with  improper specimen collection/handling, submission of specimen other than nasopharyngeal swab, presence of viral mutation(s) within the areas targeted by this assay, and inadequate number of viral copies(<138 copies/mL). A negative result must be combined with clinical observations, patient history, and epidemiological information. The expected result is Negative.  Fact Sheet for Patients:  EntrepreneurPulse.com.au  Fact Sheet for Healthcare Providers:  IncredibleEmployment.be  This test is no t yet approved or cleared by the [place] FDA and  has been authorized for detection and/or diagnosis of SARS-CoV-2 by FDA under an Emergency Use Authorization (EUA). This EUA will remain  in effect (meaning this test can be used) for the duration of the COVID-19 declaration under Section 564(b)(1) of the Act, 21 U.S.C.section 360bbb-3(b)(1), unless the authorization is terminated  or revoked sooner.       Influenza A by PCR NEGATIVE NEGATIVE Final   Influenza B by PCR NEGATIVE  NEGATIVE Final    Comment: (NOTE) The Xpert Xpress SARS-CoV-2/FLU/RSV plus assay is intended as an aid in the diagnosis of influenza from Nasopharyngeal swab specimens and should not be used as a sole basis for treatment. Nasal washings and aspirates are unacceptable for Xpert Xpress SARS-CoV-2/FLU/RSV testing.  Fact Sheet for Patients: EntrepreneurPulse.com.au  Fact Sheet for Healthcare Providers: IncredibleEmployment.be  This test is not yet approved or cleared by the [place] FDA and has been authorized for detection and/or diagnosis of SARS-CoV-2 by FDA under an Emergency Use Authorization (EUA). This EUA will remain in effect (meaning this test can be used) for the duration of the COVID-19 declaration under Section 564(b)(1) of the Act, 21 U.S.C. section 360bbb-3(b)(1), unless the authorization is terminated or revoked.  Performed at [facility], [place] [address] [place], [place] [address]   Culture, blood (routine x 2)     Status: None   Collection Time: 08/17/22 12:38 PM   Specimen: BLOOD  Result Value Ref Range Status   Specimen Description BLOOD RIGHT ANTECUBITAL  Final  Special Requests   Final    BOTTLES DRAWN AEROBIC AND ANAEROBIC Blood Culture adequate volume   Culture   Final    NO GROWTH 5 DAYS Performed at [facility], [place] [address] [place], [place] [address]    Report Status 08/22/2022 FINAL  Final  Culture, blood (routine x 2)     Status: None   Collection Time: 08/17/22 12:43 PM   Specimen: BLOOD  Result Value Ref Range Status   Specimen Description BLOOD BLOOD RIGHT HAND  Final   Special Requests   Final    BOTTLES DRAWN AEROBIC AND ANAEROBIC Blood Culture results may not be optimal due to an inadequate volume of blood received in culture bottles   Culture   Final    NO GROWTH 5 DAYS Performed at [facility], [place] [address], [place], [place] [address]    Report Status 08/22/2022 FINAL   Final  Expectorated Sputum Assessment w Gram Stain, Rflx to Resp Cult     Status: None   Collection Time: 08/18/22 10:27 PM   Specimen: Sputum  Result Value Ref Range Status   Specimen Description SPUTUM  Final   Special Requests NONE  Final   Sputum evaluation   Final    THIS SPECIMEN IS ACCEPTABLE FOR SPUTUM CULTURE Performed at [facility], [place] [address], [place], [place] [address]    Report Status 08/20/2022 FINAL  Final  Culture, Respiratory w Gram Stain     Status: None   Collection Time: 08/18/22 10:27 PM   Specimen: SPU  Result Value Ref Range Status   Specimen Description SPUTUM  Final   Special Requests NONE Reflexed from [id number]  Final   Gram Stain   Final    RARE WBC PRESENT,BOTH PMN AND MONONUCLEAR NO ORGANISMS SEEN    Culture   Final    RARE Normal respiratory flora-no Staph aureus or Pseudomonas seen Performed at [facility], [place] [address], [place], [name] [address]    Report Status 08/21/2022 FINAL  Final    Labs: CBC: Recent Labs  Lab 08/19/22 0043 08/20/22 0132 08/21/22 0227 08/22/22 0052  WBC 11.0* 15.1* 10.9* 10.4  NEUTROABS 7.2  --  9.6*  --   HGB 9.7* 9.2* 9.8* 10.3*  HCT 29.8* 28.8* 29.8* 30.1*  MCV 87.9 89.2 87.4 83.8  PLT 247 227 248 124   Basic Metabolic Panel: Recent Labs  Lab 08/18/22 0620 08/19/22 0043 08/20/22 0132 08/21/22 0227 08/22/22 0052 08/22/22 0741 08/23/22 0124 08/24/22 0040  NA  --  130*   < > 127* 121* 128* 128* 134*  K  --  4.0   < > 4.3 4.0 4.2 4.1 3.9  CL  --  97*   < > 96* 91* 94* 97* 106  CO2  --  22   < > 19* 18* 19* 19* 21*  GLUCOSE  --  137*   < > 169* 228* 209* 144* 157*  BUN  --  29*   < > 39* 43* 42* 34* 27*  CREATININE  --  1.52*   < > 1.78* 1.70* 1.72* 1.33* 1.32*  CALCIUM  --  8.5*   < > 8.8* 8.5* 8.8* 8.9 8.8*  MG 1.6* 2.0  --  2.0  --   --   --   --   PHOS  --  3.6  --  4.6  --   --   --   --    < > =  values in this interval not displayed.   Liver Function Tests: Recent Labs   Lab 08/19/22 0043 08/21/22 0227  AST 49* 55*  ALT 23 37  ALKPHOS 92 93  BILITOT 0.7 0.6  PROT 6.0* 6.2*  ALBUMIN 2.8* 2.8*   CBG: Recent Labs  Lab 08/23/22 1057 08/23/22 1708 08/23/22 2109 08/24/22 0614 08/24/22 1131  GLUCAP 82 111* 160* 127* 156*    Discharge time spent: greater than 30 minutes.  Signed: Patrecia Pour, MD [facility] 08/24/2022

## 2022-08-25 LAB — T3, FREE: T3, Free: 1.2 pg/mL — ABNORMAL LOW (ref 2.0–4.4)

## 2022-08-26 ENCOUNTER — Ambulatory Visit: Payer: Self-pay | Admitting: *Deleted

## 2022-08-26 MED ORDER — VITAMIN D3 10 MCG (400 UNIT) PO TABS: 800.0000 [IU] | ORAL_TABLET | Freq: Every day | ORAL | 2 refills | Status: AC

## 2022-08-26 NOTE — Telephone Encounter (Signed)
Does pt need to continue taking VIT D if so can refill be sent to her pharmacy.

## 2022-08-26 NOTE — Telephone Encounter (Signed)
Pt was recently hospitalized and they did not given her either the Vit D or Vit D3 that are on her current med list because neither has a dose listed. Family is trying to set up her meds for her but there is not any vit D with her meds because no one was ever given a dose. They are on current med list (without dose) by historical provider. Please call granddaughter Anna [name] [phone] to give her the doses.  Reason for Disposition  [1] Follow-up call to recent contact AND [2] information only call, no triage required  Answer Assessment - Initial Assessment Questions 1. REASON FOR CALL or QUESTION: "What is your reason for calling today?" or "How can I best help you?" or "What question do you have that I can help answer?"     Needs help with discharge instructions.  Protocols used: Information Only Call - No Triage-A-AH

## 2022-08-27 NOTE — Telephone Encounter (Signed)
Call placed to patient and VM was left with granddaughter informing her that VIT D pills has been refilled and sent to pharmacy.

## 2022-08-28 ENCOUNTER — Telehealth: Payer: Self-pay

## 2022-08-28 NOTE — Telephone Encounter (Signed)
Transition Care Management Follow-up Telephone Call  Call completed with patient's granddaughter, [name]. DPR on file to speak with her Date of discharge and from where: 08/24/2022, [facility].  How have you been since you were released from the hospital? [name] said the patient is  " doing  good, feeling okay."  Any questions or concerns? Yes- [name] said she has been making multiple calls to clarify what is going on with her grandmother. She called nephrology regarding her kidney function and to schedule a follow up.   She was concerned about all of the discharge instructions regarding heart failure but was not sure if her grandmother really has a diagnosis of heart failure and will discuss further with cardiology.   She was not sure if her grandmother is to continue the " water pill" and I told her that if she is referring to furosemide that is to be stopped and she then saw on the AVS to stop taking it.    Items Reviewed: Did the pt receive and understand the discharge instructions provided? Yes  but [name] said she will need to review the instructions again.  Medications obtained and verified? Yes - they have all of patient's medications but have to pick up the Vitamin D at the pharmacy.  [name] understands that a prescription was sent to the pharmacy but if it is not covered by insurance, she will need to pay for it OTC.  Other? No  Any new allergies since your discharge? No  Dietary orders reviewed? Yes- [name] is aware of the order for a heart healthy low sodium diet as well as the 1500 ml /day fluid restriction; however she said that one of the doctors in the hospital told her to limit fluid intake to 65 oz/day/  Do you have support at home? Yes - [name] is currently staying with her so she can administer patient's eye drops multiple times// day as ordered by the ophthalmologist.   Home Care and Equipment/Supplies: Were home health services ordered? no If so, what is the name of  the agency? N/a  Has the agency set up a time to come to the patient's home? not applicable Were any new equipment or medical supplies ordered?  No What is the name of the medical supply agency? N/a Were you able to get the supplies/equipment? not applicable Do you have any questions related to the use of the equipment or supplies? No  Functional Questionnaire: (I = Independent and D = Dependent) ADLs: needs assistance.   She is able to ambulate with her walker with assistance but [name] said that she spends most of her time in the wheelchair.  She has a PCS aide Mon-Fri 0900-1200.    Follow up appointments reviewed:  PCP Hospital f/u appt confirmed? Yes  Scheduled to see Dr Wynetta Emery - 09/27/2022.  She did not want to scheduled her grandmother with another provider or at another clinic so she wants to wait until the scheduled appointment with PCP [facility] f/u appt confirmed? Yes  Scheduled to see [place] - 09/04/2022,   Are transportation arrangements needed? No  If their condition worsens, is the pt aware to call PCP or go to the Emergency Dept.? Yes Was the patient provided with contact information for the PCP's office or ED? Yes Was to pt encouraged to call back with questions or concerns? Yes

## 2022-08-30 ENCOUNTER — Telehealth: Payer: Self-pay | Admitting: Cardiology

## 2022-08-30 NOTE — Telephone Encounter (Signed)
Pt c/o medication issue:  1. Name of Medication:  apixaban (ELIQUIS) 5 MG TABS tablet  2. How are you currently taking this medication (dosage and times per day)?   3. Are you having a reaction (difficulty breathing--STAT)?   4. What is your medication issue?   Nevin Bloodgood of [place] states they have prescriptions for Eliquis and Xarelto and she would like to know if patient needs to be taking both. Nevin Bloodgood requested that the call be returned to the patient to advise. She states a call back to the pharmacy is not necessary unless Dr. Radford Pax has questions.

## 2022-08-30 NOTE — Telephone Encounter (Signed)
Granddaughter, [name], called in to verify if patient is supposed to be taking Eliquis or Xarelto. Reviewed discharge medications from recent hospitalization and verified patient is taking Eliquis. The Xarelto was discontinued.  [name] verbalized understanding.

## 2022-09-02 NOTE — Telephone Encounter (Signed)
This encounter was created in error - please disregard.

## 2022-09-02 NOTE — Progress Notes (Incomplete)
HEART & VASCULAR TRANSITION OF CARE CONSULT NOTE     Referring Physician: Primary Care: Primary Cardiologist:  HPI: Referred to clinic by *** for heart failure consultation. 86 y.o. female with hx of permanent atrial flutter, HTN, HLD, mitral regurgitation/mitral stenosis, CKD IIIa.   Echo 07/23: EF 55-60%, no RWMA, RV okay, mild to moderate MR and mild MS with mean gradient of 4.5 mmHg  She presented 08/17/22 with acute respiratory failure with hypoxia 2/2 RLL PNA and acute systolic CHF.  She ruled in for NSTEMI and troponin peaked at 5091.  Echo with EF 25%, akinetic mid and distal anterior septum and entire apex, RV mildly reduced, moderate BAE, mild MR, mild to moderate TR, dilated IVC.  LHC with mild nonobstructive CAD. LVEDP 20 mmHg. Presentation felt to be most likely consistent with stress MI/Takotsubo cardiomyopathy.  She was treated with abx, diuresed with IV lasix and started on GDMT. Coarse further c/b hypervolemic hyponatremia and AKI d/t cardiorenal syndrome +/- contrast for LHC. Nephrology followed patient. Palliative care consulted for [place] discussion. Code status discussed. She was not ready to consider DNR status. PPS 50%.  She is here today for f/u.     Review of Systems: [y] = yes, [ ]  = no   General: Weight gain [ ] ; Weight loss [ ] ; Anorexia [ ] ; Fatigue [ ] ; Fever [ ] ; Chills [ ] ; Weakness [ ]   Cardiac: Chest pain/pressure [ ] ; Resting SOB [ ] ; Exertional SOB [ ] ; Orthopnea [ ] ; Pedal Edema [ ] ; Palpitations [ ] ; Syncope [ ] ; Presyncope [ ] ; Paroxysmal nocturnal dyspnea[ ]   Pulmonary: Cough [ ] ; Wheezing[ ] ; Hemoptysis[ ] ; Sputum [ ] ; Snoring [ ]   GI: Vomiting[ ] ; Dysphagia[ ] ; Melena[ ] ; Hematochezia [ ] ; Heartburn[ ] ; Abdominal pain [ ] ; Constipation [ ] ; Diarrhea [ ] ; BRBPR [ ]   GU: Hematuria[ ] ; Dysuria [ ] ; Nocturia[ ]   Vascular: Pain in legs with walking [ ] ; Pain in feet with lying flat [ ] ; Non-healing sores [ ] ; Stroke [ ] ; TIA [ ] ; Slurred speech [  ];  Neuro: Headaches[ ] ; Vertigo[ ] ; Seizures[ ] ; Paresthesias[ ] ;Blurred vision [ ] ; Diplopia [ ] ; Vision changes [ ]   Ortho/Skin: Arthritis [ ] ; Joint pain [ ] ; Muscle pain [ ] ; Joint swelling [ ] ; Back Pain [ ] ; Rash [ ]   Psych: Depression[ ] ; Anxiety[ ]   Heme: Bleeding problems [ ] ; Clotting disorders [ ] ; Anemia [ ]   Endocrine: Diabetes [ ] ; Thyroid dysfunction[ ]    Past Medical History:  Diagnosis Date   Hyperlipidemia    Hypertension    Mitral regurgitation    mild to moderate by echo 04/2022   Mitral stenosis    mild by echo 04/2022   Osteoarthritis    Overweight(278.02)    Permanent atrial fibrillation (HCC)     Current Outpatient Medications  Medication Sig Dispense Refill   apixaban (ELIQUIS) 5 MG TABS tablet Take 1 tablet (5 mg total) by mouth 2 (two) times daily. 60 tablet 11   carvedilol (COREG) 3.125 MG tablet Take 1 tablet (3.125 mg total) by mouth 2 (two) times daily with a meal. 60 tablet 0   Cholecalciferol (VITAMIN D3) 10 MCG (400 UNIT) tablet Take 2 tablets (800 Units total) by mouth daily. 120 tablet 2   cyclobenzaprine (FLEXERIL) 10 MG tablet Take 0.5 tablets (5 mg total) by mouth 2 (two) times daily as needed for muscle spasms. 20 tablet 0   dapagliflozin propanediol (FARXIGA) 10 MG TABS  tablet Take 1 tablet (10 mg total) by mouth daily. 30 tablet 0   diclofenac Sodium (VOLTAREN) 1 % GEL Apply 2 grams to knees 4 times a day as needed. (Patient taking differently: Apply 2 g topically daily as needed (For pain).) 400 g 1   docusate sodium (COLACE) 100 MG capsule Take 100 mg by mouth daily.      ferrous sulfate 325 (65 FE) MG tablet TAKE 1 TABLET BY MOUTH EVERY DAY WITH BREAKFAST (Patient taking differently: Take 325 mg by mouth daily with breakfast.) 100 tablet 1   gabapentin (NEURONTIN) 100 MG capsule TAKE 1 CAPSULE BY MOUTH THREE TIMES A DAY (Patient taking differently: Take 100 mg by mouth 3 (three) times daily.) 270 capsule 0   HYDROcodone-acetaminophen  (NORCO/VICODIN) 5-325 MG tablet Take 1 tablet by mouth every 6 (six) hours as needed for moderate pain.     metFORMIN (GLUCOPHAGE) 500 MG tablet TAKE 1 TABLET BY MOUTH EVERY DAY WITH BREAKFAST (Patient taking differently: Take 500 mg by mouth daily with breakfast.) 90 tablet 1   Misc. Devices Forensic scientist. Devices MISC Rollator walker 1 Device 0   Misc. [facility] bed 1 Device 0   Omega-3 Fatty Acids (FISH OIL CONCENTRATE PO) Take 1 capsule by mouth daily.      rosuvastatin (CRESTOR) 10 MG tablet TAKE 1 TABLET BY MOUTH EVERY DAY (Patient taking differently: Take 10 mg by mouth daily.) 90 tablet 3   sacubitril-valsartan (ENTRESTO) 24-26 MG Take 1 tablet by mouth 2 (two) times daily. 60 tablet 0   White Petrolatum-Mineral Oil (SYSTANE NIGHTTIME) OINT Place 1 drop into the left eye 4 (four) times daily.     No current facility-administered medications for this visit.    No Known Allergies    Social History   Socioeconomic History   Marital status: Divorced    Spouse name: Not on file   Number of children: 3   Years of education: Not on file   Highest education level: Not on file  Occupational History   Occupation: retired  Tobacco Use   Smoking status: Never   Smokeless tobacco: Never  Vaping Use   Vaping Use: Never used  Substance and Sexual Activity   Alcohol use: No   Drug use: No   Sexual activity: Not on file  Other Topics Concern   Not on file  Social History Narrative   Not on file   Social Determinants of Health   Financial Resource Strain: Low Risk  (08/21/2022)   Overall Financial Resource Strain (CARDIA)    Difficulty of Paying Living Expenses: Not very hard  Food Insecurity: No Food Insecurity (08/17/2022)   Hunger Vital Sign    Worried About Running Out of Food in the Last Year: Never true    Ran Out of Food in the Last Year: Never true  Transportation Needs: No Transportation Needs (08/17/2022)   PRAPARE -  Hydrologist (Medical): No    Lack of Transportation (Non-Medical): No  Physical Activity: Insufficiently Active (07/15/2021)   Exercise Vital Sign    Days of Exercise per Week: 5 days    Minutes of Exercise per Session: 10 min  Stress: Not on file  Social Connections: Not on file  Intimate Partner Violence: Not At Risk (08/17/2022)   Humiliation, Afraid, Rape, and Kick questionnaire    Fear of Current or Ex-Partner: No    Emotionally Abused: No  Physically Abused: No    Sexually Abused: No      Family History  Problem Relation Age of Onset   Alzheimer's disease Mother     There were no vitals filed for this visit.  PHYSICAL EXAM: General:  Well appearing. No respiratory difficulty HEENT: normal Neck: supple. no JVD. Carotids 2+ bilat; no bruits. No lymphadenopathy or thryomegaly appreciated. Cor: PMI nondisplaced. Regular rate & rhythm. No rubs, gallops or murmurs. Lungs: clear Abdomen: soft, nontender, nondistended. No hepatosplenomegaly. No bruits or masses. Good bowel sounds. Extremities: no cyanosis, clubbing, rash, edema Neuro: alert & oriented x 3, cranial nerves grossly intact. moves all 4 extremities w/o difficulty. Affect pleasant.  ECG:   ASSESSMENT & PLAN:  New systolic CHF/Takotsubo cardiomyopathy  NSTEMI w/o CAD  Permanent atrial flutter  HTN  NYHA *** GDMT  Diuretic- BB- Ace/ARB/ARNI MRA SGLT2i    Referred to HFSW (PCP, Medications, Transportation, ETOH Abuse, Drug Abuse, Insurance, Financial ): Yes or No Refer to Pharmacy: Yes or No Refer to Home Health: Yes on No Refer to Advanced Heart Failure Clinic: Yes or no  Refer to General Cardiology: Yes or No  Follow up

## 2022-09-03 ENCOUNTER — Other Ambulatory Visit: Payer: Self-pay | Admitting: Internal Medicine

## 2022-09-03 ENCOUNTER — Other Ambulatory Visit: Payer: Self-pay | Admitting: Cardiology

## 2022-09-03 DIAGNOSIS — I10 Essential (primary) hypertension: Secondary | ICD-10-CM

## 2022-09-03 DIAGNOSIS — I4892 Unspecified atrial flutter: Secondary | ICD-10-CM

## 2022-09-03 NOTE — Telephone Encounter (Signed)
Unable to refill per protocol, Rx expired. Medication was discontinued. Will refuse.  Requested Prescriptions  Pending Prescriptions Disp Refills   amlodipine-olmesartan (AZOR) 10-20 MG tablet [Pharmacy Med Name: AMLODIPINE-OLMESARTAN 10-20 MG] 90 tablet 1    Sig: TAKE 1 TABLET BY MOUTH EVERY DAY     Cardiovascular: CCB + ARB Combos Failed - 09/03/2022 10:09 AM      Failed - Cr in normal range and within 180 days    Creat  Date Value Ref Range Status  06/19/2016 1.04 (H) 0.60 - 0.93 mg/dL Final    Comment:      For patients > or = 84 years of age: The upper reference limit for Creatinine is approximately 13% higher for people identified as African-American.      Creatinine, Ser  Date Value Ref Range Status  08/24/2022 1.32 (H) 0.44 - 1.00 mg/dL Final         Failed - Na in normal range and within 180 days    Sodium  Date Value Ref Range Status  08/24/2022 134 (L) 135 - 145 mmol/L Final  11/30/2021 137 134 - 144 mmol/L Final         Failed - Last BP in normal range    BP Readings from Last 1 Encounters:  08/24/22 (!) 121/44         Passed - K in normal range and within 180 days    Potassium  Date Value Ref Range Status  08/24/2022 3.9 3.5 - 5.1 mmol/L Final         Passed - Patient is not pregnant      Passed - Valid encounter within last 6 months    Recent Outpatient Visits           5 months ago Essential hypertension   [place], Dalbert Batman, MD   9 months ago Essential hypertension   [place] Ladell Pier, MD   1 year ago Essential hypertension   [place] Ladell Pier, MD   1 year ago Gait disturbance   [place] Ladell Pier, MD   1 year ago Hospital discharge follow-up   Primary Care at [facility], [place], NP       Future Appointments             In 3 weeks Ladell Pier, MD [place]            Signed Prescriptions Disp Refills   ferrous sulfate 325 (65 FE) MG tablet 90 tablet 0    Sig: Take 1 tablet (325 mg total) by mouth daily with breakfast.     Endocrinology:  Minerals - Iron Supplementation Failed - 09/03/2022 10:09 AM      Failed - HGB in normal range and within 360 days    Hemoglobin  Date Value Ref Range Status  08/22/2022 10.3 (L) 12.0 - 15.0 g/dL Final  03/28/2022 11.6 11.1 - 15.9 g/dL Final         Failed - HCT in normal range and within 360 days    HCT  Date Value Ref Range Status  08/22/2022 30.1 (L) 36.0 - 46.0 % Final   Hematocrit  Date Value Ref Range Status  03/28/2022 35.6 34.0 - 46.6 % Final         Failed - RBC in normal range and within 360 days  RBC  Date Value Ref Range Status  08/22/2022 3.59 (L) 3.87 - 5.11 MIL/uL Final         Failed - Ferritin in normal range and within 360 days    Ferritin  Date Value Ref Range Status  03/28/2022 507 (H) 15 - 150 ng/mL Final         Passed - Fe (serum) in normal range and within 360 days    Iron  Date Value Ref Range Status  03/28/2022 87 27 - 139 ug/dL Final   Iron Saturation  Date Value Ref Range Status  03/28/2022 22 15 - 55 % Final         Passed - Valid encounter within last 12 months    Recent Outpatient Visits           5 months ago Essential hypertension   [place], Deborah B, MD   9 months ago Essential hypertension   [place], Deborah B, MD   1 year ago Essential hypertension   [place], MD   1 year ago Gait disturbance   [place], MD   1 year ago Hospital discharge follow-up   Primary Care at [facility], [place]nie Hailstone, NP       Future Appointments             In 3 weeks Ladell Pier, MD [place]

## 2022-09-03 NOTE — Telephone Encounter (Signed)
Requested Prescriptions  Pending Prescriptions Disp Refills   amlodipine-olmesartan (AZOR) 10-20 MG tablet [Pharmacy Med Name: AMLODIPINE-OLMESARTAN 10-20 MG] 90 tablet 1    Sig: TAKE 1 TABLET BY MOUTH EVERY DAY     Cardiovascular: CCB + ARB Combos Failed - 09/03/2022 10:09 AM      Failed - Cr in normal range and within 180 days    Creat  Date Value Ref Range Status  06/19/2016 1.04 (H) 0.60 - 0.93 mg/dL Final    Comment:      For patients > or = 84 years of age: The upper reference limit for Creatinine is approximately 13% higher for people identified as African-American.      Creatinine, Ser  Date Value Ref Range Status  08/24/2022 1.32 (H) 0.44 - 1.00 mg/dL Final         Failed - Na in normal range and within 180 days    Sodium  Date Value Ref Range Status  08/24/2022 134 (L) 135 - 145 mmol/L Final  11/30/2021 137 134 - 144 mmol/L Final         Failed - Last BP in normal range    BP Readings from Last 1 Encounters:  08/24/22 (!) 121/44         Passed - K in normal range and within 180 days    Potassium  Date Value Ref Range Status  08/24/2022 3.9 3.5 - 5.1 mmol/L Final         Passed - Patient is not pregnant      Passed - Valid encounter within last 6 months    Recent Outpatient Visits           5 months ago Essential hypertension   [facility] [place], Binnie Rail, MD   9 months ago Essential hypertension   [facility] Marcine Matar, MD   1 year ago Essential hypertension   [facility] Marcine Matar, MD   1 year ago Gait disturbance   [facility] Marcine Matar, MD   1 year ago Hospital discharge follow-up   Primary Care at [facility], [place], NP       Future Appointments             In 3 weeks Marcine Matar, MD [facility]             ferrous sulfate 325 (65 FE) MG  tablet [Pharmacy Med Name: FERROUS SULFATE 325 MG TABLET] 100 tablet 1    Sig: TAKE 1 TABLET BY MOUTH EVERY DAY WITH BREAKFAST     Endocrinology:  Minerals - Iron Supplementation Failed - 09/03/2022 10:09 AM      Failed - HGB in normal range and within 360 days    Hemoglobin  Date Value Ref Range Status  08/22/2022 10.3 (L) 12.0 - 15.0 g/dL Final  61/95/0932 67.1 11.1 - 15.9 g/dL Final         Failed - HCT in normal range and within 360 days    HCT  Date Value Ref Range Status  08/22/2022 30.1 (L) 36.0 - 46.0 % Final   Hematocrit  Date Value Ref Range Status  03/28/2022 35.6 34.0 - 46.6 % Final         Failed - RBC in normal range and within 360 days    RBC  Date Value Ref Range Status  08/22/2022  3.59 (L) 3.87 - 5.11 MIL/uL Final         Failed - Ferritin in normal range and within 360 days    Ferritin  Date Value Ref Range Status  03/28/2022 507 (H) 15 - 150 ng/mL Final         Passed - Fe (serum) in normal range and within 360 days    Iron  Date Value Ref Range Status  03/28/2022 87 27 - 139 ug/dL Final   Iron Saturation  Date Value Ref Range Status  03/28/2022 22 15 - 55 % Final         Passed - Valid encounter within last 12 months    Recent Outpatient Visits           5 months ago Essential hypertension   [place], Deborah B, MD   9 months ago Essential hypertension   [place], Deborah B, MD   1 year ago Essential hypertension   [place], MD   1 year ago Gait disturbance   [place], MD   1 year ago Hospital discharge follow-up   Primary Care at [facility], Fl[place]nie Hailstone, NP       Future Appointments             In 3 weeks Ladell Pier, MD [place]

## 2022-09-04 ENCOUNTER — Encounter (HOSPITAL_COMMUNITY): Payer: Self-pay

## 2022-09-04 ENCOUNTER — Ambulatory Visit (HOSPITAL_COMMUNITY)
Admit: 2022-09-04 | Discharge: 2022-09-04 | Disposition: A | Discharge status: Home / Self Care | Payer: Medicare Other | Attending: Physician Assistant | Admitting: Physician Assistant

## 2022-09-04 VITALS — BP 164/80 | HR 45 | Wt 171.2 lb

## 2022-09-04 DIAGNOSIS — E871 Hypo-osmolality and hyponatremia: Secondary | ICD-10-CM | POA: Insufficient documentation

## 2022-09-04 DIAGNOSIS — I5023 Acute on chronic systolic (congestive) heart failure: Secondary | ICD-10-CM | POA: Diagnosis not present

## 2022-09-04 DIAGNOSIS — I5181 Takotsubo syndrome: Secondary | ICD-10-CM

## 2022-09-04 DIAGNOSIS — M1990 Unspecified osteoarthritis, unspecified site: Secondary | ICD-10-CM | POA: Insufficient documentation

## 2022-09-04 DIAGNOSIS — I130 Hypertensive heart and chronic kidney disease with heart failure and stage 1 through stage 4 chronic kidney disease, or unspecified chronic kidney disease: Secondary | ICD-10-CM | POA: Insufficient documentation

## 2022-09-04 DIAGNOSIS — I214 Non-ST elevation (NSTEMI) myocardial infarction: Secondary | ICD-10-CM

## 2022-09-04 DIAGNOSIS — E785 Hyperlipidemia, unspecified: Secondary | ICD-10-CM | POA: Insufficient documentation

## 2022-09-04 DIAGNOSIS — I4892 Unspecified atrial flutter: Secondary | ICD-10-CM | POA: Diagnosis not present

## 2022-09-04 DIAGNOSIS — I10 Essential (primary) hypertension: Secondary | ICD-10-CM

## 2022-09-04 DIAGNOSIS — N179 Acute kidney failure, unspecified: Secondary | ICD-10-CM

## 2022-09-04 DIAGNOSIS — Z7901 Long term (current) use of anticoagulants: Secondary | ICD-10-CM | POA: Diagnosis not present

## 2022-09-04 DIAGNOSIS — Z79899 Other long term (current) drug therapy: Secondary | ICD-10-CM | POA: Diagnosis not present

## 2022-09-04 DIAGNOSIS — N1831 Chronic kidney disease, stage 3a: Secondary | ICD-10-CM | POA: Insufficient documentation

## 2022-09-04 DIAGNOSIS — R7303 Prediabetes: Secondary | ICD-10-CM | POA: Insufficient documentation

## 2022-09-04 DIAGNOSIS — I2510 Atherosclerotic heart disease of native coronary artery without angina pectoris: Secondary | ICD-10-CM | POA: Diagnosis not present

## 2022-09-04 DIAGNOSIS — I052 Rheumatic mitral stenosis with insufficiency: Secondary | ICD-10-CM | POA: Diagnosis not present

## 2022-09-04 DIAGNOSIS — I5022 Chronic systolic (congestive) heart failure: Secondary | ICD-10-CM | POA: Diagnosis not present

## 2022-09-04 LAB — CBC
HCT: 34.2 % — ABNORMAL LOW (ref 36.0–46.0)
Hemoglobin: 10.9 g/dL — ABNORMAL LOW (ref 12.0–15.0)
MCH: 28.3 pg (ref 26.0–34.0)
MCHC: 31.9 g/dL (ref 30.0–36.0)
MCV: 88.8 fL (ref 80.0–100.0)
Platelets: 212 10*3/uL (ref 150–400)
RBC: 3.85 MIL/uL — ABNORMAL LOW (ref 3.87–5.11)
RDW: 14.6 % (ref 11.5–15.5)
WBC: 4.6 10*3/uL (ref 4.0–10.5)
nRBC: 0 % (ref 0.0–0.2)

## 2022-09-04 LAB — COMPREHENSIVE METABOLIC PANEL
ALT: 11 U/L (ref 0–44)
AST: 22 U/L (ref 15–41)
Albumin: 3.4 g/dL — ABNORMAL LOW (ref 3.5–5.0)
Alkaline Phosphatase: 65 U/L (ref 38–126)
Anion gap: 7 (ref 5–15)
BUN: 11 mg/dL (ref 8–23)
CO2: 23 mmol/L (ref 22–32)
Calcium: 9.3 mg/dL (ref 8.9–10.3)
Chloride: 107 mmol/L (ref 98–111)
Creatinine, Ser: 0.93 mg/dL (ref 0.44–1.00)
GFR, Estimated: >60 mL/min (ref >60)
Glucose, Bld: 91 mg/dL (ref 70–99)
Potassium: 4.3 mmol/L (ref 3.5–5.1)
Sodium: 137 mmol/L (ref 135–145)
Total Bilirubin: 0.6 mg/dL (ref 0.3–1.2)
Total Protein: 6.7 g/dL (ref 6.5–8.1)

## 2022-09-04 LAB — BRAIN NATRIURETIC PEPTIDE: B Natriuretic Peptide: 969.5 pg/mL — ABNORMAL HIGH (ref 0.0–100.0)

## 2022-09-04 MED ORDER — DAPAGLIFLOZIN PROPANEDIOL 10 MG PO TABS: 10.0000 mg | ORAL_TABLET | Freq: Every day | ORAL | 0 refills | Status: DC

## 2022-09-04 MED ORDER — CARVEDILOL 3.125 MG PO TABS: 3.1250 mg | ORAL_TABLET | Freq: Two times a day (BID) | ORAL | 0 refills | Status: DC

## 2022-09-04 MED ORDER — SACUBITRIL-VALSARTAN 24-26 MG PO TABS: 1.0000 | ORAL_TABLET | Freq: Two times a day (BID) | ORAL | 0 refills | Status: DC

## 2022-09-04 MED ORDER — SPIRONOLACTONE 25 MG PO TABS: 12.5000 mg | ORAL_TABLET | Freq: Every day | ORAL | 0 refills | Status: DC

## 2022-09-04 NOTE — Addendum Note (Signed)
Encounter addended by: Andrey Farmer, PA-C on: 09/04/2022 3:34 PM  Actions taken: Clinical Note Signed

## 2022-09-04 NOTE — Patient Instructions (Signed)
Labs done today. We will contact you only if your labs are abnormal.  START Spironolactone 12.5mg  (1/2 tablet) by mouth daily.   Clifton Custard and Carvedilol has been refilled. For further refills you can request those from Dr. Malachy Mood office.   No other medication changes were made. Please continue all current medications as prescribed.  Your physician recommends that you schedule a follow-up appointment in: 1 week for a lab only appointment.    Thank you for allowing [place] to provider your heart failure care after your recent hospitalization. Please follow-up with Dr. Mayford Knife

## 2022-09-04 NOTE — Telephone Encounter (Signed)
Called to confirm Heart & Vascular Transitions of Care appointment at 2 pm on 09/04/22. Patient reminded to bring all medications and pill box organizer with them. Confirmed patient has transportation. Gave directions, instructed to utilize valet parking.  Confirmed appointment prior to ending call.    Rhae Hammock, BSN, Scientist, clinical (histocompatibility and immunogenetics) Only

## 2022-09-06 ENCOUNTER — Encounter: Payer: Self-pay | Admitting: Cardiology

## 2022-09-06 ENCOUNTER — Ambulatory Visit: Payer: Medicare Other | Attending: Cardiology | Admitting: Cardiology

## 2022-09-06 VITALS — BP 134/76 | HR 62 | Ht 65.0 in | Wt 171.0 lb

## 2022-09-06 DIAGNOSIS — I10 Essential (primary) hypertension: Secondary | ICD-10-CM | POA: Diagnosis not present

## 2022-09-06 DIAGNOSIS — I340 Nonrheumatic mitral (valve) insufficiency: Secondary | ICD-10-CM

## 2022-09-06 DIAGNOSIS — I050 Rheumatic mitral stenosis: Secondary | ICD-10-CM | POA: Diagnosis not present

## 2022-09-06 DIAGNOSIS — I5181 Takotsubo syndrome: Secondary | ICD-10-CM

## 2022-09-06 DIAGNOSIS — I4821 Permanent atrial fibrillation: Secondary | ICD-10-CM | POA: Diagnosis not present

## 2022-09-06 NOTE — Addendum Note (Signed)
Addended by: Frutoso Schatz on: 09/06/2022 03:38 PM   Modules accepted: Orders

## 2022-09-06 NOTE — Progress Notes (Signed)
Date:  09/06/2022   ID:  Anna [name], DOB May 12, 1936, MRN [id number]   PCP:  Ladell Pier, MD  Cardiologist:  Fransico Him, MD  Electrophysiologist:  None   Chief Complaint:  AF, HTN  History of Present Illness:    Anna [name] is a 86 y.o. female with a hx of HTN, permanent  atrial fibrillation on Xarelto due to her CHADS2VASC score of 3 and mild MR and mild mitral stenosis by echo 7-22.   She presented to the emergency room a few weeks ago with acute respiratory failure and hypoxemia secondary to right lower lobe pneumonia and acute systolic heart failure was diagnosed.  She ruled in for NSTEMI with troponin of 5091.  BP was markedly elevated during this time as her granddaughter had stopped her BP medications a week prior because the patient was feeling ill.  EF showed EF 25% with an akinetic mid to distal anterior septum and entire apex with mild RV dysfunction, moderate biatrial enlargement, mild MR and mild to moderate TR.    Left heart cath demonstrated minimal luminal irregularities in the LAD and LVEDP 20 mmHg.  It was felt she had a stress MI/Takotsubo cardiomyopathy.  She was treated with IV Lasix and started on GDMT P with low-dose carvedilol 3.25 mg twice daily, Entresto 24-26 mg twice daily and ultimately at outpatient visit: Spironolactone 12.5 mg daily.  She is also on Farxiga 10 mg daily.  At discharge her weight was 184 pounds and was down to 171 pounds in [facility] heart failure clinic on 09/04/2022.    She is here today for followup and is doing well.  She denies any chest pain or pressure, SOB, DOE, PND, orthopnea, LE edema, dizziness, palpitations or syncope. SHe is compliant with her meds and is tolerating meds with no SE.      Prior CV studies:   The following studies were reviewed today:  none  Past Medical History:  Diagnosis Date   Hyperlipidemia    Hypertension    Mitral regurgitation    mild to moderate by echo 04/2022   Mitral stenosis    mild by  echo 04/2022   Osteoarthritis    Overweight(278.02)    Permanent atrial fibrillation [facility])    Past Surgical History:  Procedure Laterality Date   CARDIOVERSION N/A 03/24/2014   Procedure: CARDIOVERSION;  Surgeon: Sueanne Margarita, MD;  Location: [facility];  Service: Cardiovascular;  Laterality: N/A;   LEFT HEART CATH AND CORONARY ANGIOGRAPHY N/A 08/20/2022   Procedure: LEFT HEART CATH AND CORONARY ANGIOGRAPHY;  Surgeon: Jettie Booze, MD;  Location: [place] CV LAB;  Service: Cardiovascular;  Laterality: N/A;     Current Meds  Medication Sig   apixaban (ELIQUIS) 5 MG TABS tablet Take 1 tablet (5 mg total) by mouth 2 (two) times daily.   carvedilol (COREG) 3.125 MG tablet Take 1 tablet (3.125 mg total) by mouth 2 (two) times daily with a meal.   Cholecalciferol (VITAMIN D3) 10 MCG (400 UNIT) tablet Take 2 tablets (800 Units total) by mouth daily.   cyclobenzaprine (FLEXERIL) 10 MG tablet Take 0.5 tablets (5 mg total) by mouth 2 (two) times daily as needed for muscle spasms.   dapagliflozin propanediol (FARXIGA) 10 MG TABS tablet Take 1 tablet (10 mg total) by mouth daily.   diclofenac Sodium (VOLTAREN) 1 % GEL Apply 2 grams to knees 4 times a day as needed.   docusate sodium (COLACE) 100 MG capsule Take 100 mg  by mouth daily.    ferrous sulfate 325 (65 FE) MG tablet Take 1 tablet (325 mg total) by mouth daily with breakfast.   gabapentin (NEURONTIN) 100 MG capsule TAKE 1 CAPSULE BY MOUTH THREE TIMES A DAY   HYDROcodone-acetaminophen (NORCO/VICODIN) 5-325 MG tablet Take 1 tablet by mouth every 6 (six) hours as needed for moderate pain.   metFORMIN (GLUCOPHAGE) 500 MG tablet TAKE 1 TABLET BY MOUTH EVERY DAY WITH BREAKFAST   Misc. Devices Event organiser. Devices MISC Rollator walker   Misc. Devices MISC Hospital bed   Omega-3 Fatty Acids (FISH OIL CONCENTRATE PO) Take 1 capsule by mouth daily.    rosuvastatin (CRESTOR) 10 MG tablet TAKE 1 TABLET BY MOUTH EVERY DAY    sacubitril-valsartan (ENTRESTO) 24-26 MG Take 1 tablet by mouth 2 (two) times daily.   spironolactone (ALDACTONE) 25 MG tablet Take 0.5 tablets (12.5 mg total) by mouth daily.   White Petrolatum-Mineral Oil (SYSTANE NIGHTTIME) OINT Place 1 drop into the left eye 4 (four) times daily.     Allergies:   Patient has no known allergies.   Social History   Tobacco Use   Smoking status: Never   Smokeless tobacco: Never  Vaping Use   Vaping Use: Never used  Substance Use Topics   Alcohol use: No   Drug use: No     Family Hx: The patient's family history includes Alzheimer's disease in her mother.  ROS:   Please see the history of present illness.     All other systems reviewed and are negative.   Labs/Other Tests and Data Reviewed:    Recent Labs: 08/21/2022: Magnesium 2.0 08/23/2022: TSH 0.317 09/04/2022: ALT 11; B Natriuretic Peptide 969.5; BUN 11; Creatinine, Ser 0.93; Hemoglobin 10.9; Platelets 212; Potassium 4.3; Sodium 137   Recent Lipid Panel Lab Results  Component Value Date/Time   CHOL 69 08/18/2022 06:20 AM   CHOL 106 03/28/2022 04:13 PM   TRIG 62 08/18/2022 06:20 AM   HDL 33 (L) 08/18/2022 06:20 AM   HDL 49 03/28/2022 04:13 PM   CHOLHDL 2.1 08/18/2022 06:20 AM   LDLCALC 24 08/18/2022 06:20 AM   LDLCALC 44 03/28/2022 04:13 PM    Wt Readings from Last 3 Encounters:  09/06/22 171 lb (77.6 kg)  09/04/22 171 lb 3.2 oz (77.7 kg)  08/24/22 184 lb 4.9 oz (83.6 kg)     Objective:    Vital Signs:  BP 134/76   Pulse 62   Ht 5\' 5"  (1.651 m)   Wt 171 lb (77.6 kg)   SpO2 96%   BMI 28.46 kg/m    GEN: Well nourished, well developed in no acute distress HEENT: Normal NECK: No JVD; No carotid bruits LYMPHATICS: No lymphadenopathy CARDIAC:irregularly irregular, no murmurs, rubs, gallops RESPIRATORY:  Clear to auscultation without rales, wheezing or rhonchi  ABDOMEN: Soft, non-tender, non-distended MUSCULOSKELETAL:  trace edema; No deformity  SKIN: Warm and  dry NEUROLOGIC:  Alert and oriented x 3 PSYCHIATRIC:  Normal affect   ASSESSMENT & PLAN:    1.  Permanent Atrial Fibrillation -Heart rate is well controlled and atrial fibrillation today -She denies any bleeding problems on Eliquis -I have personally reviewed and interpreted outside PCP performed by patient's PCP which showed serum creatinine 1.32 potassium 3.9 hemoglobin 10.3 on 08/22/2022 -Continue prescription drug management with carvedilol 3.125 mg twice daily and Eliquis 5mg  BID with as needed refills  2.  HTN -BP is well controlled on exam today  -Continue prescription drug managed  with carvedilol 3.125 mg twice daily, Entresto 24-26 mg twice daily and spironolactone 12.5 mg daily with as needed refills  3.  Mitral regurgitation/mitral stenosis -2D echo 7/22 showed normal LV function with trivial MR and mild mitral stenosis -Repeat echo 7/23 showed no mitral stenosis and only mild MR  4.  Dilated cardiomyopathy/chronic combined systolic/diastolic CHF -She was hospitalized a week ago with NSTEMI and found to have stress-induced cardiomyopathy.   -Cath showed mild nonobstructive CAD with minimal luminal irregularities in the LAD  -2D echo demonstrated severe LV dysfunction with EF 25%, mildly reduced RV systolic function with mild PA systolic hypertension, moderate biatrial enlargement, mild MR, mild TR and mid to distal anterior septal and apical akinesis consistent with Takotsubo cardiomyopathy  -nephrology was managing her Lasix. -Due to AKI during hospitalization she was discharged home on carvedilol 3.125 mg p.o. twice daily, Farxiga 10 mg daily, Entresto 24-26 mg twice daily and then seen in [facility] clinic 2 days ago and was started on spironolactone 12.5 mg daily. Her BNP was elevated on 11/8 but was started on Aldactone -We will not make any medication changes today and continue on current medications checking a bmet and BNP  in 1 week. -Repeat 2D echo in 3 weeks to see if LV  function has improved and if still the same will uptitrate GDMT  Follow-up with me in 2 months  Medication Adjustments/Labs and Tests Ordered: Current medicines are reviewed at length with the patient today.  Concerns regarding medicines are outlined above.  Tests Ordered: No orders of the defined types were placed in this encounter.  Medication Changes: No orders of the defined types were placed in this encounter.   Disposition:  Follow up in 1 year(s)  Signed, Fransico Him, MD  09/06/2022 3:30 PM    [place]

## 2022-09-06 NOTE — Addendum Note (Signed)
Addended by: Frutoso Schatz on: 09/06/2022 03:41 PM   Modules accepted: Orders

## 2022-09-06 NOTE — Patient Instructions (Signed)
Medication Instructions:  Your physician recommends that you continue on your current medications as directed. Please refer to the Current Medication list given to you today.  *If you need a refill on your cardiac medications before your next appointment, please call your pharmacy*   Lab Work: IN ONE WEEK: BMET and BNP If you have labs (blood work) drawn today and your tests are completely normal, you will receive your results only by: MyChart Message (if you have MyChart) OR A paper copy in the mail If you have any lab test that is abnormal or we need to change your treatment, we will call you to review the results.   Testing/Procedures: Your physician has requested that you have an echocardiogram. Echocardiography is a painless test that uses sound waves to create images of your heart. It provides your doctor with information about the size and shape of your heart and how well your heart's chambers and valves are working. This procedure takes approximately one hour. There are no restrictions for this procedure. Please do NOT wear cologne, perfume, aftershave, or lotions (deodorant is allowed). Please arrive 15 minutes prior to your appointment time.   Follow-Up: At [facility], you and your health needs are our priority.  As part of our continuing mission to provide you with exceptional heart care, we have created designated Provider Care Teams.  These Care Teams include your primary Cardiologist (physician) and Advanced Practice Providers (APPs -  Physician Assistants and Nurse Practitioners) who all work together to provide you with the care you need, when you need it.  Your next appointment:   2 month(s)  The format for your next appointment:   In Person  Provider:   Armanda Magic, MD     Important Information About Sugar

## 2022-09-12 ENCOUNTER — Other Ambulatory Visit (HOSPITAL_COMMUNITY): Payer: Medicare Other

## 2022-09-13 ENCOUNTER — Ambulatory Visit: Payer: Medicare Other | Attending: Cardiology

## 2022-09-13 DIAGNOSIS — I10 Essential (primary) hypertension: Secondary | ICD-10-CM

## 2022-09-13 DIAGNOSIS — I4821 Permanent atrial fibrillation: Secondary | ICD-10-CM

## 2022-09-13 DIAGNOSIS — I050 Rheumatic mitral stenosis: Secondary | ICD-10-CM

## 2022-09-13 DIAGNOSIS — I340 Nonrheumatic mitral (valve) insufficiency: Secondary | ICD-10-CM

## 2022-09-13 DIAGNOSIS — I5181 Takotsubo syndrome: Secondary | ICD-10-CM

## 2022-09-14 LAB — BASIC METABOLIC PANEL
BUN/Creatinine Ratio: 15 (ref 12–28)
BUN: 15 mg/dL (ref 8–27)
CO2: 24 mmol/L (ref 20–29)
Calcium: 9.8 mg/dL (ref 8.7–10.3)
Chloride: 100 mmol/L (ref 96–106)
Creatinine, Ser: 1.02 mg/dL — ABNORMAL HIGH (ref 0.57–1.00)
Glucose: 81 mg/dL (ref 70–99)
Potassium: 4.3 mmol/L (ref 3.5–5.2)
Sodium: 138 mmol/L (ref 134–144)
eGFR: 54 mL/min/{1.73_m2} — ABNORMAL LOW (ref >59)

## 2022-09-14 LAB — PRO B NATRIURETIC PEPTIDE: NT-Pro BNP: 2255 pg/mL — ABNORMAL HIGH (ref 0–738)

## 2022-09-17 ENCOUNTER — Telehealth: Payer: Self-pay | Admitting: *Deleted

## 2022-09-17 DIAGNOSIS — I5022 Chronic systolic (congestive) heart failure: Secondary | ICD-10-CM

## 2022-09-17 MED ORDER — FUROSEMIDE 20 MG PO TABS: 20.0000 mg | ORAL_TABLET | Freq: Every day | ORAL | 3 refills | Status: DC

## 2022-09-17 NOTE — Telephone Encounter (Signed)
I called and spoke with the patient's granddaughter again.  She voices understanding of recommendations.  Lasix sent to preferred pharmacy.  Pt will come next week on 11/29 for labs.  Appointment scheduled.

## 2022-09-17 NOTE — Telephone Encounter (Signed)
-----   Message from Lendon Ka, RN sent at 09/16/2022  5:29 PM EST -----  ----- Message ----- From: Quintella Reichert, MD Sent: 09/16/2022   2:51 PM EST To: Lendon Ka, RN  Since renal function has normalized and BNP is elevated - please start Lasix 20mg  BID x 2 days and then go down to 20mg  daily and repeat BNP and BMET in 1 week ----- Message ----- From: , RN Sent: 09/16/2022   1:08 PM EST To: Lendon Ka, MD  Discussed w granddaughter patient's daily fluid intake.  She said they were instructed to follow 1500 cc /day restriction.  She was unsure how to communicate this with the patient.  We reviewed her usual fluids per day which seem much higher than 1500cc.  She will help the patient follow 6-7 cups worth of fluids daily.

## 2022-09-20 ENCOUNTER — Other Ambulatory Visit: Payer: Self-pay | Admitting: Internal Medicine

## 2022-09-20 DIAGNOSIS — I10 Essential (primary) hypertension: Secondary | ICD-10-CM

## 2022-09-23 NOTE — Telephone Encounter (Signed)
Requested medication (s) are due for refill today: no  Requested medication (s) are on the active medication list: no  Last refill:  05/14/22   Future visit scheduled: yes  Notes to clinic:  second request for this discontinued med. Med was dc'd  08/14/22 "stop taking at discharge"   Requested Prescriptions  Pending Prescriptions Disp Refills   amlodipine-olmesartan (AZOR) 10-20 MG tablet [Pharmacy Med Name: AMLODIPINE-OLMESARTAN 10-20 MG] 90 tablet 1    Sig: TAKE 1 TABLET BY MOUTH EVERY DAY     Cardiovascular: CCB + ARB Combos Failed - 09/20/2022 12:43 PM      Failed - Cr in normal range and within 180 days    Creat  Date Value Ref Range Status  06/19/2016 1.04 (H) 0.60 - 0.93 mg/dL Final    Comment:      For patients > or = 84 years of age: The upper reference limit for Creatinine is approximately 13% higher for people identified as African-American.      Creatinine, Ser  Date Value Ref Range Status  09/13/2022 1.02 (H) 0.57 - 1.00 mg/dL Final         Passed - K in normal range and within 180 days    Potassium  Date Value Ref Range Status  09/13/2022 4.3 3.5 - 5.2 mmol/L Final         Passed - Na in normal range and within 180 days    Sodium  Date Value Ref Range Status  09/13/2022 138 134 - 144 mmol/L Final         Passed - Patient is not pregnant      Passed - Last BP in normal range    BP Readings from Last 1 Encounters:  09/06/22 134/76         Passed - Valid encounter within last 6 months    Recent Outpatient Visits           5 months ago Essential hypertension   [facility] Marcine Matar, MD   10 months ago Essential hypertension   [facility] Marcine Matar, MD   1 year ago Essential hypertension    [facility] Marcine Matar, [facility]   1 year ago Gait disturbance   [facility] Marcine Matar, MD   1 year ago  Hospital discharge follow-up   [facility], Salomon Fick, NP       Future Appointments             In 4 days Marcine Matar, MD [facility]   In 1 month Turner, Cornelious Bryant, MD [facility] [address] Dept Of [place]. [facility]

## 2022-09-25 ENCOUNTER — Ambulatory Visit: Payer: Medicare Other | Attending: Cardiology

## 2022-09-25 DIAGNOSIS — I5022 Chronic systolic (congestive) heart failure: Secondary | ICD-10-CM

## 2022-09-26 LAB — BASIC METABOLIC PANEL
BUN/Creatinine Ratio: 23 (ref 12–28)
BUN: 29 mg/dL — ABNORMAL HIGH (ref 8–27)
CO2: 27 mmol/L (ref 20–29)
Calcium: 10.2 mg/dL (ref 8.7–10.3)
Chloride: 96 mmol/L (ref 96–106)
Creatinine, Ser: 1.28 mg/dL — ABNORMAL HIGH (ref 0.57–1.00)
Glucose: 131 mg/dL — ABNORMAL HIGH (ref 70–99)
Potassium: 4.3 mmol/L (ref 3.5–5.2)
Sodium: 139 mmol/L (ref 134–144)
eGFR: 41 mL/min/{1.73_m2} — ABNORMAL LOW (ref >59)

## 2022-09-26 LAB — PRO B NATRIURETIC PEPTIDE: NT-Pro BNP: 1190 pg/mL — ABNORMAL HIGH (ref 0–738)

## 2022-09-27 ENCOUNTER — Ambulatory Visit: Payer: Medicare Other | Attending: Internal Medicine | Admitting: Internal Medicine

## 2022-09-27 VITALS — BP 120/67 | HR 52 | Temp 97.6°F | Wt 168.0 lb

## 2022-09-27 DIAGNOSIS — I5020 Unspecified systolic (congestive) heart failure: Secondary | ICD-10-CM

## 2022-09-27 DIAGNOSIS — I5181 Takotsubo syndrome: Secondary | ICD-10-CM | POA: Diagnosis not present

## 2022-09-27 DIAGNOSIS — I5042 Chronic combined systolic (congestive) and diastolic (congestive) heart failure: Secondary | ICD-10-CM

## 2022-09-27 DIAGNOSIS — N3946 Mixed incontinence: Secondary | ICD-10-CM

## 2022-09-27 DIAGNOSIS — N1832 Chronic kidney disease, stage 3b: Secondary | ICD-10-CM | POA: Diagnosis not present

## 2022-09-27 DIAGNOSIS — N838 Other noninflammatory disorders of ovary, fallopian tube and broad ligament: Secondary | ICD-10-CM

## 2022-09-27 DIAGNOSIS — Z09 Encounter for follow-up examination after completed treatment for conditions other than malignant neoplasm: Secondary | ICD-10-CM

## 2022-09-27 DIAGNOSIS — R918 Other nonspecific abnormal finding of lung field: Secondary | ICD-10-CM

## 2022-09-27 DIAGNOSIS — R269 Unspecified abnormalities of gait and mobility: Secondary | ICD-10-CM

## 2022-09-27 DIAGNOSIS — M170 Bilateral primary osteoarthritis of knee: Secondary | ICD-10-CM

## 2022-09-27 DIAGNOSIS — D638 Anemia in other chronic diseases classified elsewhere: Secondary | ICD-10-CM

## 2022-09-27 DIAGNOSIS — Z23 Encounter for immunization: Secondary | ICD-10-CM

## 2022-09-27 DIAGNOSIS — Z9181 History of falling: Secondary | ICD-10-CM

## 2022-09-27 MED ORDER — ZOSTER VAC RECOMB ADJUVANTED 50 MCG/0.5ML IM SUSR: 0.5000 mL | Freq: Once | INTRAMUSCULAR | 0 refills | Status: AC

## 2022-09-27 NOTE — Progress Notes (Addendum)
Patient ID: Anna [name], female    DOB: 08-27-1936  MRN: [id number]  CC: hosp f/u Hypertension (HTN f/u. Med refill - discuss Vit D & spironolactone. Interested in getting meds that are already packaged for daily use./Already received flu vax. Anna [name] like a bedside toilet & table, chucks and pull-up underwear.)   Subjective: Anna [name] is a 86 y.o. female who presents for hosp f/u.  Anna [name], Anna [name] is with her Her concerns today include:  history of atrial flutter on Eliquis with history of cardioversion 2015, HTN, trivial AR, mild MR,OA knees, HL, prediabetes, CKD 3, ACD, lumbar radiculopathy, hx of vertebral fx receiving pain management through Dr. Joselyn [name] at [facility].    Patient hospitalized 10/21-20 05/2022 with cough, SOB, CP and found to have multifocal pneumonia with NSTEMI causing acute HFrEF (EF 25% with left regional wall motion abnormalities, diastolic parameters indeterminate).  Cardiac catheterization demonstrated minimal luminal irregularities in the LAD.  MI was thought to be stress cardiomyopathy.  Course complicated by hyponatremia and AKA on stage IIIa CKD.  Low sodium improve with SGLT2i -Treated with antibiotics and diuresed. Incidental findings on CT of the chest were mediastinal and right hilar lymphadenopathy likely reactive and a 1.2 x 0.7 and 0.6 cm right upper lobe nodules.  Radiologist recommended repeat CT in 3 to 6 months. Incidental finding on CT of the abdomen with cholelithiasis, aortic atherosclerosis and an enlarged left ovary measuring 3.8 x 2.7 cm of unclear etiology.  Today: Systolic CHF-stress-induced cardiomyopathy: She is seen the cardiologist Dr. Mayford [name] in follow-up recently.  She is on guideline directed medications including carvedilol 3.125 mg twice a day, furosemide 40 mg daily, spironolactone 12.5 mg daily, Farxiga 10 mg daily and Entresto 24/26 mg 1 tablet twice a day. -She is also taking Eliquis for her history of chronic atrial  flutter Granddaughter wants to know which pharmacy would offer blister packs.  She feels patient's medication in a pillbox.  Patient is very independent and tells her that she can do it herself.  Granddaughter states that she can but sometimes she gets a little confused.  CKD: Creatinine at the time of discharge was 1.33.  She was rechecked recently by cardiology.  Creatinine was 1.28 with GFR of 41  GFR has bounce around a little with titration in CHF meds from >60 earlier this month to 54 to 41.  Granddaughter's has placed a call to her nephrologist and is waiting on callback to schedule a follow-up appointment.  Granddaughter is requesting prescription for incontinence supplies.  Patient has leakage of urine when she coughs or laughs.  When she gets the urge to urinate, she is unable to hold it until she gets to the restroom.  Decreased mobility also affects this.  She would like some depends undergarments and chuck pads.  Gait disturbance, decreased mobility, OA of the knees:  Patient has a mobility chair but spends most of her time in her manual wheelchair.  She also ambulates short distances in her house with walker.  1 week ago she fell on her buttocks when she turned with the walker to sit on the bed and missed the mattress by a few inches.  She sustained no injury. -Patient and granddaughter also reports falls at nights at times when she tries to get out of bed to go to the bathroom.  They are requesting a bedside commode. Also request an adjustable height rolling tray table to allow her to eat in bed and in her WC -  He has home health aide from [facility], who comes in Monday through Friday from 9 AM to 12 PM.  Starting next week they will be coming 7 days a week from 9 AM to 1 PM.  Anemia: Patient has a mild anemia.  Last H/H in June was 11.6/35.6.  Recent 1 done 3 weeks ago was 10.9/34.2.  She is on iron supplement.  Patient with past history of fragility fracture.  Not on Fosamax due to  kidney function.  Granddaughter wanted to confirm the dose of vitamin D3 that she should be on which is 800 units daily. Patient Active Problem List   Diagnosis Date Noted   Elevated troponin    Acute heart failure (HCC)    NSTEMI (non-ST elevated myocardial infarction) (HCC) 08/17/2022   Mitral stenosis 05/07/2021   Recurrent falls 04/24/2021   History of vertebral fracture 04/24/2021   Lumbar radiculopathy 04/24/2021   Anemia due to stage 3a chronic kidney disease (HCC) 03/07/2020   Stage 3a chronic kidney disease (HCC) 03/07/2020   Mixed incontinence urge and stress 03/09/2018   Prediabetes 03/09/2018   OA (osteoarthritis) of knee 03/09/2018   Sinus bradycardia 12/18/2016   Estrogen deficiency 07/03/2016   Mitral regurgitation 03/01/2014   Chronic atrial flutter (HCC) 03/01/2014   Permanent atrial fibrillation (HCC) 12/24/2013   Essential hypertension, benign 12/24/2013   Heart murmur 12/24/2013   Hyperlipemia 12/24/2013     Current Outpatient Medications on File Prior to Visit  Medication Sig Dispense Refill   apixaban (ELIQUIS) 5 MG TABS tablet Take 1 tablet (5 mg total) by mouth 2 (two) times daily. 60 tablet 11   carvedilol (COREG) 3.125 MG tablet Take 1 tablet (3.125 mg total) by mouth 2 (two) times daily with a meal. 180 tablet 0   Cholecalciferol (VITAMIN D3) 10 MCG (400 UNIT) tablet Take 2 tablets (800 Units total) by mouth daily. 120 tablet 2   cyclobenzaprine (FLEXERIL) 10 MG tablet Take 0.5 tablets (5 mg total) by mouth 2 (two) times daily as needed for muscle spasms. 20 tablet 0   dapagliflozin propanediol (FARXIGA) 10 MG TABS tablet Take 1 tablet (10 mg total) by mouth daily. 90 tablet 0   diclofenac Sodium (VOLTAREN) 1 % GEL Apply 2 grams to knees 4 times a day as needed. 400 g 1   docusate sodium (COLACE) 100 MG capsule Take 100 mg by mouth daily.      ferrous sulfate 325 (65 FE) MG tablet Take 1 tablet (325 mg total) by mouth daily with breakfast. 90 tablet 0    furosemide (LASIX) 20 MG tablet Take 1 tablet (20 mg total) by mouth daily. 90 tablet 3   gabapentin (NEURONTIN) 100 MG capsule TAKE 1 CAPSULE BY MOUTH THREE TIMES A DAY 270 capsule 0   HYDROcodone-acetaminophen (NORCO/VICODIN) 5-325 MG tablet Take 1 tablet by mouth every 6 (six) hours as needed for moderate pain.     metFORMIN (GLUCOPHAGE) 500 MG tablet TAKE 1 TABLET BY MOUTH EVERY DAY WITH BREAKFAST 90 tablet 1   Misc. Devices Paramedic. Devices MISC Rollator walker 1 Device 0   Misc. Devices MISC Hospital bed 1 Device 0   Omega-3 Fatty Acids (FISH OIL CONCENTRATE PO) Take 1 capsule by mouth daily.      rosuvastatin (CRESTOR) 10 MG tablet TAKE 1 TABLET BY MOUTH EVERY DAY 90 tablet 3   sacubitril-valsartan (ENTRESTO) 24-26 MG Take 1 tablet by mouth 2 (two) times daily. 180 tablet  0   spironolactone (ALDACTONE) 25 MG tablet Take 0.5 tablets (12.5 mg total) by mouth daily. 45 tablet 0   White Petrolatum-Mineral Oil (SYSTANE NIGHTTIME) OINT Place 1 drop into the left eye 4 (four) times daily.     No current facility-administered medications on file prior to visit.    No Known Allergies  Social History   Socioeconomic History   Marital status: Divorced    Spouse name: Not on file   Number of children: 3   Years of education: Not on file   Highest education level: Not on file  Occupational History   Occupation: retired  Tobacco Use   Smoking status: Never   Smokeless tobacco: Never  Vaping Use   Vaping Use: Never used  Substance and Sexual Activity   Alcohol use: No   Drug use: No   Sexual activity: Not on file  Other Topics Concern   Not on file  Social History Narrative   Not on file   Social Determinants of Health   Financial Resource Strain: Low Risk  (08/21/2022)   Overall Financial Resource Strain (CARDIA)    Difficulty of Paying Living Expenses: Not very hard  Food Insecurity: No Food Insecurity (08/17/2022)   Hunger Vital Sign     Worried About Running Out of Food in the Last Year: Never true    Ran Out of Food in the Last Year: Never true  Transportation Needs: No Transportation Needs (08/17/2022)   PRAPARE - Administrator, Civil Service (Medical): No    Lack of Transportation (Non-Medical): No  Physical Activity: Insufficiently Active (07/15/2021)   Exercise Vital Sign    Days of Exercise per Week: 5 days    Minutes of Exercise per Session: 10 min  Stress: Not on file  Social Connections: Not on file  Intimate Partner Violence: Not At Risk (08/17/2022)   Humiliation, Afraid, Rape, and Kick questionnaire    Fear of Current or Ex-Partner: No    Emotionally Abused: No    Physically Abused: No    Sexually Abused: No    Family History  Problem Relation Age of Onset   Alzheimer's disease Mother     Past Surgical History:  Procedure Laterality Date   CARDIOVERSION N/A 03/24/2014   Procedure: CARDIOVERSION;  Surgeon: Quintella Reichert, MD;  Location: [facility];  Service: Cardiovascular;  Laterality: N/A;   LEFT HEART CATH AND CORONARY ANGIOGRAPHY N/A 08/20/2022   Procedure: LEFT HEART CATH AND CORONARY ANGIOGRAPHY;  Surgeon: Corky Crafts, MD;  Location: [facility] INVASIVE CV LAB;  Service: Cardiovascular;  Laterality: N/A;    ROS: Review of Systems Negative except as stated above  PHYSICAL EXAM: BP 120/67 (BP Location: Left Arm, Patient Position: Sitting, Cuff Size: Normal)   Pulse (!) 52   Temp 97.6 F (36.4 C) (Oral)   Wt 168 lb (76.2 kg)   SpO2 99%   BMI 27.96 kg/m   Wt Readings from Last 3 Encounters:  09/27/22 168 lb (76.2 kg)  09/06/22 171 lb (77.6 kg)  09/04/22 171 lb 3.2 oz (77.7 kg)    Physical Exam  General appearance -pleasant frail elderly Caucasian female sitting in wheelchair in NAD Mental status - normal mood, behavior, speech, dress, motor activity, and thought processes Chest - clear to auscultation, no wheezes, rales or rhonchi, symmetric air entry Heart - normal  rate, regular rhythm, normal S1, S2, no murmurs, rubs, clicks or gallops Extremities - trace LE edema      Latest  Ref Rng & Units 09/25/2022    2:48 PM 09/13/2022    3:44 PM 09/04/2022    2:57 PM  CMP  Glucose 70 - 99 mg/dL 500  81  91   BUN 8 - 27 mg/dL 29  15  11    Creatinine 0.57 - 1.00 mg/dL  9.38  1.82   Sodium 134 - 144 mmol/L 139  138  137   Potassium 3.5 - 5.2 mmol/L 4.3  4.3  4.3   Chloride 96 - 106 mmol/L 96  100  107   CO2 20 - 29 mmol/L 27  24  23    Calcium 8.7 - 10.3 mg/dL 9.93  9.8  9.3   Total Protein 6.5 - 8.1 g/dL   6.7   Total Bilirubin 0.3 - 1.2 mg/dL   0.6   Alkaline Phos 38 - 126 U/L   65   AST 15 - 41 U/L   22   ALT 0 - 44 U/L   11    Lipid Panel     Component Value Date/Time   CHOL 69 08/18/2022 0620   CHOL 106 03/28/2022 1613   TRIG 62 08/18/2022 0620   HDL 33 (L) 08/18/2022 0620   HDL 49 03/28/2022 1613   CHOLHDL 2.1 08/18/2022 0620   VLDL 12 08/18/2022 0620   LDLCALC 24 08/18/2022 0620   LDLCALC 44 03/28/2022 1613    CBC    Component Value Date/Time   WBC 4.6 09/04/2022 1457   RBC 3.85 (L) 09/04/2022 1457   HGB 10.9 (L) 09/04/2022 1457   HGB 11.6 03/28/2022 1613   HCT 34.2 (L) 09/04/2022 1457   HCT 35.6 03/28/2022 1613   PLT 212 09/04/2022 1457   PLT 253 03/28/2022 1613   MCV 88.8 09/04/2022 1457   MCV 88 03/28/2022 1613   MCH 28.3 09/04/2022 1457   MCHC 31.9 09/04/2022 1457   RDW 14.6 09/04/2022 1457   RDW 12.7 03/28/2022 1613   LYMPHSABS 0.7 08/21/2022 0227   LYMPHSABS 1.9 12/18/2016 1452   MONOABS 0.5 08/21/2022 0227   EOSABS 0.0 08/21/2022 0227   EOSABS 0.2 12/18/2016 1452   BASOSABS 0.0 08/21/2022 0227   BASOSABS 0.1 12/18/2016 1452    ASSESSMENT AND PLAN:  1. Hospital discharge follow-up   2. systolic CHF (congestive heart failure) (HCC) 3. Stress-induced cardiomyopathy -Patient has followed up with cardiology already. She will continue GDT including carvedilol, furosemide, spironolactone, Farxiga,  Entresto. -Stop metformin given CHF and fact that she is now on Farxiga.  4. Stage 3b chronic kidney disease (HCC) -Affected by current medications including diuretics used to address volume status.  Encouraged to continue to follow-up with nephrology  5. Mixed incontinence urge and stress Prescription will be sent to [facility] for incontinence supplies. - For home use only DME Other see comment  6. Gait disturbance Prescription will be sent to [facility] for bedside commode and adjustable height rolling food tray. Patient declines home physical therapy. Addendum 10/02/2022:  according to pt's niece, nearest bathroom at nights is more than 50 feet across the hall.  Pt has severe arthritis in both knees which significantly limits her mobility. - DME Bedside commode  7. Primary osteoarthritis of both knees See #6 above - DME Bedside commode  8. History of recent fall #6 above - DME Bedside commode  9. Anemia, chronic disease Will continue to monitor.  10. Lung nodules I forgot to address the lung nodules and the large ovary with patient and her granddaughter so  I called them after they had left to discuss.  They are agreeable for repeat CAT scan of the lungs in 3 to 6 months.  I think the lymphadenopathy was likely reactive to the pneumonia that she had going on. We will plan to order the CAT scan of the chest when I see her in follow-up in 4 months. 11. Large ovary We discussed referring her to gynecology and they are agreeable to this. - Ambulatory referral to Gynecology  12. Need for shingles vaccine Patient thinks she had the old shingles vaccine that was a one-time dose several years ago.  I told her that the updated vaccine now is called Shingrix and is a 2 vaccine series.  Prescription given for her to get the first shot of the Shingrix vaccine.  pt to follow-up with me in 4 months.  I will have them give her an appointment to have her Medicare wellness visit done with one of the  CMA's. Advised patient's granddaughter that she can call [facility] to inquire about blister packs.  If they do blister packs and deliver and she would like to change to them, she can let me know so that I can forward her prescriptions there.  She expressed understanding.  Patient was given the opportunity to ask questions.  Patient verbalized understanding of the plan and was able to repeat key elements of the plan.   This documentation was completed using Paediatric nurse.  Any transcriptional errors are unintentional.  Orders Placed This Encounter  Procedures   DME Bedside commode   For home use only DME Other see comment   For home use only DME Other see comment   Ambulatory referral to Gynecology     Requested Prescriptions   Signed Prescriptions Disp Refills   Zoster Vaccine Adjuvanted [facility]) injection 0.5 mL 0    Sig: Inject 0.5 mLs into the muscle once for 1 dose.    Return in about 4 months (around 01/27/2023) for Give appt with CMA for Medicare Wellness Visit.  Jonah Blue, MD, FACP

## 2022-09-30 ENCOUNTER — Telehealth: Payer: Medicare Other | Admitting: Emergency Medicine

## 2022-09-30 NOTE — Telephone Encounter (Signed)
Copied from CRM [id number]. Topic: General - Other >> Sep 30, 2022 10:44 AM Pincus Sanes wrote: Reason for CRM: [facility] is refaxing an order for a medical equipment. They received an order today but needs more info.  Requiest for on the lookout.Marland KitchenMarland KitchenFax is being resent now with additional info added. Any Questions Rosey Bath at [facility] [phone] or [facility] fax [phone]

## 2022-10-01 ENCOUNTER — Other Ambulatory Visit: Payer: Medicare Other

## 2022-10-07 ENCOUNTER — Ambulatory Visit (HOSPITAL_COMMUNITY)
Admission: RE | Admit: 2022-10-07 | Discharge: 2022-10-07 | Disposition: A | Discharge status: Home / Self Care | Payer: Medicare Other | Source: Ambulatory Visit | Attending: Cardiology | Admitting: Cardiology

## 2022-10-07 DIAGNOSIS — I081 Rheumatic disorders of both mitral and tricuspid valves: Secondary | ICD-10-CM | POA: Insufficient documentation

## 2022-10-07 DIAGNOSIS — I5181 Takotsubo syndrome: Secondary | ICD-10-CM

## 2022-10-07 DIAGNOSIS — I050 Rheumatic mitral stenosis: Secondary | ICD-10-CM

## 2022-10-07 DIAGNOSIS — Z041 Encounter for examination and observation following transport accident: Secondary | ICD-10-CM | POA: Insufficient documentation

## 2022-10-07 DIAGNOSIS — I4891 Unspecified atrial fibrillation: Secondary | ICD-10-CM | POA: Insufficient documentation

## 2022-10-07 DIAGNOSIS — I4821 Permanent atrial fibrillation: Secondary | ICD-10-CM

## 2022-10-07 DIAGNOSIS — I10 Essential (primary) hypertension: Secondary | ICD-10-CM | POA: Diagnosis not present

## 2022-10-07 DIAGNOSIS — I340 Nonrheumatic mitral (valve) insufficiency: Secondary | ICD-10-CM

## 2022-10-07 DIAGNOSIS — I429 Cardiomyopathy, unspecified: Secondary | ICD-10-CM | POA: Diagnosis not present

## 2022-10-07 DIAGNOSIS — E785 Hyperlipidemia, unspecified: Secondary | ICD-10-CM | POA: Insufficient documentation

## 2022-10-07 DIAGNOSIS — I428 Other cardiomyopathies: Secondary | ICD-10-CM

## 2022-10-08 LAB — ECHOCARDIOGRAM COMPLETE
Area-P 1/2: 2.17 cm2
MV VTI: 1.3 cm2
S' Lateral: 3.4 cm

## 2022-10-09 ENCOUNTER — Encounter: Payer: Self-pay | Admitting: Cardiology

## 2022-10-15 NOTE — Telephone Encounter (Signed)
Request for additional information was received by [facility] on 09/30/2022. Additional Information was successfully faxed on 10/07/2022.

## 2022-10-16 ENCOUNTER — Other Ambulatory Visit: Payer: Self-pay | Admitting: Internal Medicine

## 2022-10-16 DIAGNOSIS — I10 Essential (primary) hypertension: Secondary | ICD-10-CM

## 2022-10-25 ENCOUNTER — Other Ambulatory Visit: Payer: Self-pay | Admitting: Internal Medicine

## 2022-10-25 DIAGNOSIS — I10 Essential (primary) hypertension: Secondary | ICD-10-CM

## 2022-11-08 ENCOUNTER — Other Ambulatory Visit: Payer: Self-pay | Admitting: Internal Medicine

## 2022-11-08 DIAGNOSIS — I10 Essential (primary) hypertension: Secondary | ICD-10-CM

## 2022-11-09 ENCOUNTER — Other Ambulatory Visit: Payer: Self-pay | Admitting: Internal Medicine

## 2022-11-14 ENCOUNTER — Telehealth: Payer: Self-pay | Admitting: Emergency Medicine

## 2022-11-14 NOTE — Telephone Encounter (Signed)
Copied from [place] [id number]. Topic: General - Inquiry >> Nov 14, 2022  9:38 AM Erskine Squibb wrote: Reason for CRM: [facility] with [employer] called in stating she previously faxed over a completed form that just needed the providers signature but she is refaxing today. This is because the patient requested a larger size of the pullups than was originally requested. Please sign and fax back as soon as possible to [phone]. Please assist further

## 2022-11-15 ENCOUNTER — Telehealth: Payer: Self-pay | Admitting: Emergency Medicine

## 2022-11-15 NOTE — Telephone Encounter (Signed)
Fax has been sent

## 2022-11-15 NOTE — Telephone Encounter (Signed)
Duplicate message. 

## 2022-11-15 NOTE — Telephone Encounter (Signed)
Copied from [place] [id number]. Topic: General - Other >> Nov 14, 2022  4:53 PM Everette C wrote: Reason for CRM: [facility] with [employer] has called for an update on the completion of an updated form for the patient's larger incontinence supplies  Please contact further to provide an update on the status of paperwork when possible   The urgency of this request has been stressed for the patient's supplies

## 2022-11-20 ENCOUNTER — Ambulatory Visit: Payer: Medicare Other | Admitting: Cardiology

## 2022-11-29 ENCOUNTER — Encounter: Payer: Self-pay | Admitting: Physician Assistant

## 2022-11-29 ENCOUNTER — Ambulatory Visit: Payer: 59 | Attending: Cardiology | Admitting: Physician Assistant

## 2022-11-29 ENCOUNTER — Other Ambulatory Visit: Payer: Self-pay | Admitting: Internal Medicine

## 2022-11-29 VITALS — BP 134/64 | HR 46 | Ht 65.0 in | Wt 165.8 lb

## 2022-11-29 DIAGNOSIS — I10 Essential (primary) hypertension: Secondary | ICD-10-CM

## 2022-11-29 DIAGNOSIS — I4821 Permanent atrial fibrillation: Secondary | ICD-10-CM

## 2022-11-29 DIAGNOSIS — I5022 Chronic systolic (congestive) heart failure: Secondary | ICD-10-CM

## 2022-11-29 DIAGNOSIS — I059 Rheumatic mitral valve disease, unspecified: Secondary | ICD-10-CM

## 2022-11-29 DIAGNOSIS — I5181 Takotsubo syndrome: Secondary | ICD-10-CM

## 2022-11-29 DIAGNOSIS — I340 Nonrheumatic mitral (valve) insufficiency: Secondary | ICD-10-CM

## 2022-11-29 DIAGNOSIS — I420 Dilated cardiomyopathy: Secondary | ICD-10-CM | POA: Diagnosis not present

## 2022-11-29 NOTE — Telephone Encounter (Signed)
Requested medications are due for refill today.  Unsure  Requested medications are on the active medications list.  no  Last refill. 2023  Future visit scheduled.   yes  Notes to clinic.  Med not on med list. Med was d/c'd 08/24/2022. Please advise.    Requested Prescriptions  Pending Prescriptions Disp Refills   amlodipine-olmesartan (AZOR) 10-20 MG tablet [Pharmacy Med Name: AMLODIPINE-OLMESARTAN 10-20 MG] 90 tablet 1    Sig: TAKE 1 TABLET BY MOUTH EVERY DAY     Cardiovascular: CCB + ARB Combos Failed - 11/29/2022  9:23 AM      Failed - Cr in normal range and within 180 days    Creat  Date Value Ref Range Status  06/19/2016 1.04 (H) 0.60 - 0.93 mg/dL Final    Comment:      For patients > or = 85 years of age: The upper reference limit for Creatinine is approximately 13% higher for people identified as African-American.      Creatinine, Ser  Date Value Ref Range Status  09/25/2022 1.28 (H) 0.57 - 1.00 mg/dL Final         Passed - K in normal range and within 180 days    Potassium  Date Value Ref Range Status  09/25/2022 4.3 3.5 - 5.2 mmol/L Final         Passed - Na in normal range and within 180 days    Sodium  Date Value Ref Range Status  09/25/2022 139 134 - 144 mmol/L Final         Passed - Patient is not pregnant      Passed - Last BP in normal range    BP Readings from Last 1 Encounters:  09/27/22 120/67         Passed - Valid encounter within last 6 months    Recent Outpatient Visits           2 months ago Hospital discharge follow-up   [place], MD   8 months ago Essential hypertension   [place], MD   1 year ago Essential hypertension   [place], Deborah B, MD   1 year ago Essential hypertension   [place], MD   1 year ago Gait  disturbance   [place], MD       Future Appointments             Today Elgie Collard, PA-C [place] [facility], [place]   In 2 months Wynetta Emery, Dalbert Batman, MD [place]

## 2022-11-29 NOTE — Patient Instructions (Signed)
Medication Instructions:  Your physician recommends that you continue on your current medications as directed. Please refer to the Current Medication list given to you today.  *If you need a refill on your cardiac medications before your next appointment, please call your pharmacy*   Lab Work: None If you have labs (blood work) drawn today and your tests are completely normal, you will receive your results only by: [place] (if you have MyChart) OR A paper copy in the mail If you have any lab test that is abnormal or we need to change your treatment, we will call you to review the results.   Testing/Procedures: Your physician has requested that you have an echocardiogram in December 2024. Echocardiography is a painless test that uses sound waves to create images of your heart. It provides your doctor with information about the size and shape of your heart and how well your heart's chambers and valves are working. This procedure takes approximately one hour. There are no restrictions for this procedure. Please do NOT wear cologne, perfume, aftershave, or lotions (deodorant is allowed). Please arrive 15 minutes prior to your appointment time.    Follow-Up: At [facility], you and your health needs are our priority.  As part of our continuing mission to provide you with exceptional heart care, we have created designated Provider Care Teams.  These Care Teams include your primary Cardiologist (physician) and Advanced Practice Providers (APPs -  Physician Assistants and Nurse Practitioners) who all work together to provide you with the care you need, when you need it.  We recommend signing up for the patient portal called "MyChart".  Sign up information is provided on this After Visit Summary.  MyChart is used to connect with patients for Virtual Visits (Telemedicine).  Patients are able to view lab/test results, encounter notes, upcoming appointments, etc.  Non-urgent messages can  be sent to your provider as well.   To learn more about what you can do with MyChart, go to [email or web address].    Your next appointment:   6 month(s)  Provider:   Fransico Him, MD

## 2022-11-29 NOTE — Progress Notes (Signed)
Office Visit    Patient Name: Anna [name] Date of Encounter: 11/29/2022  PCP:  Ladell Pier, MD   [place]  Cardiologist:  Fransico Him, MD  Advanced Practice Provider:  No care team member to display Electrophysiologist:  None    Chief Complaint    Anna [name] is a 87 y.o. female with past medical history significant for hypertension, permanent atrial fibrillation on Xarelto due to CHA2DS2-VASc score of 3 and mild MR/mild mitral stenosis by echo 7/22 presents today for follow-up appointment.  Per review of her history, she presented to the emergency room a few weeks prior to her previous appointment 08/2022 with acute respiratory failure and hypoxemia secondary to right lower lobe pneumonia and acute systolic heart failure was diagnosed.  Ruled in for NSTEMI with troponin of 5091.  BP was markedly elevated during this time as her granddaughter had stopped her BP medications a week prior because the patient was feeling ill.  EF showed 25% with akinetic mid to distal anterior septum and entire apex with mild RV dysfunction, moderate biatrial enlargement, mild MR and mild to moderate TR.  Left heart catheterization demonstrated minimal luminal irregularities in the LAD and LVEDP 20 mmHg.  It was felt she had stress MI/Takotsubo cardiomyopathy.  She was treated with IV Lasix and started on GDMT with low-dose carvedilol 3.  1 2 5  mg twice daily, Entresto 24-26 mg twice daily and ultimately had outpatient visit was started on spironolactone 12.5 mg daily and Farxiga 10 mg daily.  At discharge her weight was 184 pounds and was down to 171 pounds at her [facility] visit in the clinic 09/04/22.   She was seen by Dr. Radford Pax 09/06/2022 and was doing well at that time.  She is tolerating all her medications and was asymptomatic.  Today, she states that she feels good without any symptoms.  She denies chest pain or shortness of breath.  She denied fluttering in her chest  from her atrial fibrillation.  She states she is able to do everything she wants to do.  She does get tired sometimes and requires a nap throughout the day but does not endorse excessive sleepiness.  She does have cool feeling legs which is normal for her.  She does not ambulate much only to the bathroom and back using a walker.  Otherwise, she is in a wheelchair.  Reports no shortness of breath nor dyspnea on exertion. Reports no chest pain, pressure, or tightness. No edema, orthopnea, PND. Reports no palpitations.  Past Medical History    Past Medical History:  Diagnosis Date   Hyperlipidemia    Hypertension    Mitral regurgitation    mild to moderate by echo 04/2022   Mitral stenosis    mild to moderate by echo 09/2022   Osteoarthritis    Overweight(278.02)    Permanent atrial fibrillation [facility])    Past Surgical History:  Procedure Laterality Date   CARDIOVERSION N/A 03/24/2014   Procedure: CARDIOVERSION;  Surgeon: Sueanne Margarita, MD;  Location: [place];  Service: Cardiovascular;  Laterality: N/A;   LEFT HEART CATH AND CORONARY ANGIOGRAPHY N/A 08/20/2022   Procedure: LEFT HEART CATH AND CORONARY ANGIOGRAPHY;  Surgeon: Jettie Booze, MD;  Location: [place] [facility];  Service: Cardiovascular;  Laterality: N/A;    Allergies  No Known Allergies  EKGs/Labs/Other Studies Reviewed:   The following studies were reviewed today:  Echocardiogram 10/07/2022 (following GDMT)  IMPRESSIONS  1. Left ventricular ejection fraction, by estimation, is 55 to 60%. The  left ventricle has normal function. The left ventricle has no regional  wall motion abnormalities. Left ventricular diastolic parameters are  indeterminate. Elevated left atrial  pressure.   2. Right ventricular systolic function is low normal. The right  ventricular size is normal. Tricuspid regurgitation signal is inadequate  for assessing PA pressure.   3. Left atrial size was severely dilated.   4. Right  atrial size was mild to moderately dilated.   5. Continuity equation MVA 1.2 cm2. Unable to planimeter. The mitral  valve is degenerative. Mild mitral valve regurgitation. Mild to moderate  mitral stenosis. The mean mitral valve gradient is 3.0 mmHg with average  heart rate of 55 bpm. Severe mitral  annular calcification.   6. The aortic valve is tricuspid. There is mild calcification of the  aortic valve. There is mild thickening of the aortic valve. Aortic valve  regurgitation is not visualized. No aortic stenosis is present.   Comparison(s): MR and TR have improved from prior.   FINDINGS   Left Ventricle: Left ventricular ejection fraction, by estimation, is 55  to 60%. The left ventricle has normal function. The left ventricle has no  regional wall motion abnormalities. The left ventricular internal cavity  size was normal in size. There is   no left ventricular hypertrophy. Left ventricular diastolic function  could not be evaluated due to mitral annular calcification (moderate or  greater). Left ventricular diastolic parameters are indeterminate.  Elevated left atrial pressure.   Right Ventricle: The right ventricular size is normal. No increase in  right ventricular wall thickness. Right ventricular systolic function is  low normal. Tricuspid regurgitation signal is inadequate for assessing PA  pressure.   Left Atrium: Left atrial size was severely dilated.   Right Atrium: Right atrial size was mild to moderately dilated.   Pericardium: There is no evidence of pericardial effusion.   Mitral Valve: Continuity equation MVA 1.2 cm2. Unable to planimeter. The  mitral valve is degenerative in appearance. Severe mitral annular  calcification. Mild mitral valve regurgitation. Mild to moderate mitral  valve stenosis. MV peak gradient, 12.7  mmHg. The mean mitral valve gradient is 3.0 mmHg with average heart rate  of 55 bpm.   Tricuspid Valve: The tricuspid valve is normal in  structure. Tricuspid  valve regurgitation is not demonstrated. No evidence of tricuspid  stenosis.   Aortic Valve: The aortic valve is tricuspid. There is mild calcification  of the aortic valve. There is mild thickening of the aortic valve. There  is mild aortic valve annular calcification. Aortic valve regurgitation is  not visualized. No aortic stenosis   is present.   Pulmonic Valve: The pulmonic valve was not well visualized. Pulmonic valve  regurgitation is not visualized. No evidence of pulmonic stenosis.   Aorta: The aortic root and ascending aorta are structurally normal, with  no evidence of dilitation.   IAS/Shunts: No atrial level shunt detected by color flow Doppler.      EKG:  EKG is not ordered today.    Recent Labs: 08/21/2022: Magnesium 2.0 08/23/2022: TSH 0.317 09/04/2022: ALT 11; B Natriuretic Peptide 969.5; Hemoglobin 10.9; Platelets 212 09/25/2022: BUN 29; Creatinine, Ser 1.28; NT-Pro BNP 1,190; Potassium 4.3; Sodium 139  Recent Lipid Panel    Component Value Date/Time   CHOL 69 08/18/2022 0620   CHOL 106 03/28/2022 1613   TRIG 62 08/18/2022 0620   HDL 33 (L) 08/18/2022 3220  HDL 49 03/28/2022 1613   CHOLHDL 2.1 08/18/2022 0620   VLDL 12 08/18/2022 0620   LDLCALC 24 08/18/2022 0620   LDLCALC 44 03/28/2022 1613    Risk Assessment/Calculations:   CHA2DS2-VASc Score = 5   This indicates a 7.2% annual risk of stroke. The patient's score is based upon: CHF History: 1 HTN History: 1 Diabetes History: 0 Stroke History: 0 Vascular Disease History: 0 Age Score: 2 Gender Score: 1     Home Medications   Current Meds  Medication Sig   apixaban (ELIQUIS) 5 MG TABS tablet Take 1 tablet (5 mg total) by mouth 2 (two) times daily.   carvedilol (COREG) 3.125 MG tablet Take 1 tablet (3.125 mg total) by mouth 2 (two) times daily with a meal.   Cholecalciferol (VITAMIN D3) 10 MCG (400 UNIT) tablet Take 2 tablets (800 Units total) by mouth daily.    cyclobenzaprine (FLEXERIL) 10 MG tablet Take 0.5 tablets (5 mg total) by mouth 2 (two) times daily as needed for muscle spasms.   dapagliflozin propanediol (FARXIGA) 10 MG TABS tablet Take 1 tablet (10 mg total) by mouth daily.   diclofenac Sodium (VOLTAREN) 1 % GEL Apply 2 grams to knees 4 times a day as needed.   docusate sodium (COLACE) 100 MG capsule Take 100 mg by mouth daily.    ferrous sulfate 325 (65 FE) MG tablet Take 1 tablet (325 mg total) by mouth daily with breakfast.   furosemide (LASIX) 20 MG tablet Take 1 tablet (20 mg total) by mouth daily.   gabapentin (NEURONTIN) 100 MG capsule TAKE 1 CAPSULE BY MOUTH THREE TIMES A DAY   HYDROcodone-acetaminophen (NORCO/VICODIN) 5-325 MG tablet Take 1 tablet by mouth every 6 (six) hours as needed for moderate pain.   Misc. Devices Event organiser. Devices MISC Rollator walker   Misc. Devices MISC Hospital bed   Omega-3 Fatty Acids (FISH OIL CONCENTRATE PO) Take 1 capsule by mouth daily.    rosuvastatin (CRESTOR) 10 MG tablet TAKE 1 TABLET BY MOUTH EVERY MONDAY, WEDNESDAY, FRIDAY, AND SATURDAY   sacubitril-valsartan (ENTRESTO) 24-26 MG Take 1 tablet by mouth 2 (two) times daily.   spironolactone (ALDACTONE) 25 MG tablet Take 0.5 tablets (12.5 mg total) by mouth daily.   White Petrolatum-Mineral Oil (SYSTANE NIGHTTIME) OINT Place 1 drop into the left eye 4 (four) times daily.     Review of Systems      All other systems reviewed and are otherwise negative except as noted above.  Physical Exam    VS:  BP 134/64   Pulse (!) 46   Ht 5\' 5"  (1.651 m)   Wt 165 lb 12.8 oz (75.2 kg)   SpO2 100%   BMI 27.59 kg/m  , BMI Body mass index is 27.59 kg/m.  Wt Readings from Last 3 Encounters:  11/29/22 165 lb 12.8 oz (75.2 kg)  09/27/22 168 lb (76.2 kg)  09/06/22 171 lb (77.6 kg)     GEN: Well nourished, well developed, in no acute distress. HEENT: normal. Neck: Supple, no JVD, carotid bruits, or masses. Cardiac: irregularly  irregular ,no murmurs, rubs, or gallops. No clubbing, cyanosis, edema.  Radials/PT 2+ and equal bilaterally.  Respiratory:  Respirations regular and unlabored, clear to auscultation bilaterally. GI: Soft, nontender, nondistended. MS: No deformity or atrophy. Skin: Warm and dry, no rash. Neuro:  Strength and sensation are intact. Psych: Normal affect.  Assessment & Plan    Permanent atrial fibrillation -Rate controlled today and remains on  Eliquis without any significant bleeding issues -Continue carvedilol 3.125 mg twice daily -Continue to monitor heart rate at home -Most recent lab work was done in November 2023 with potassium 4.3 and creatinine 1.28 -Pulse is not truly 46 but likely closer to 60 bpm  Hypertension -well controlled today -Continue current medication regimen including Coreg 3.125 mg twice daily, Entresto 24-26 mg twice daily, spironolactone 12.5 mg daily, Crestor 10 mg Monday, Wednesday, Friday, and Saturday, Lasix 20 mg daily, Farxiga 10 mg daily  Mitral regurgitation/mitral stenosis -Continue annual echocardiograms for surveillance -Would be due for next echo 12/24  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  Signed, Elgie Collard, PA-C 11/29/2022, 4:16 PM [facility]

## 2022-12-05 ENCOUNTER — Other Ambulatory Visit: Payer: Self-pay | Admitting: Cardiology

## 2022-12-05 DIAGNOSIS — I4821 Permanent atrial fibrillation: Secondary | ICD-10-CM

## 2022-12-05 NOTE — Telephone Encounter (Signed)
Prescription refill request for Eliquis received. Indication: Afib  Last office visit: 11/29/22 Anna [name])  Scr: 1.02 (09/13/22)  Age: 87 Weight: 75.2kg  Appropriate dose. Refill sent.

## 2022-12-16 ENCOUNTER — Other Ambulatory Visit: Payer: Self-pay | Admitting: Internal Medicine

## 2022-12-16 DIAGNOSIS — I10 Essential (primary) hypertension: Secondary | ICD-10-CM

## 2022-12-18 ENCOUNTER — Telehealth: Payer: Self-pay | Admitting: Internal Medicine

## 2022-12-18 NOTE — Telephone Encounter (Signed)
Called & LVM for the daughter, [name] (authorized to receive information per DPR on file) to call back.

## 2022-12-19 NOTE — Telephone Encounter (Signed)
Called & LVM for the daughter, [name] (authorized to received information per DPT on file) to call back

## 2022-12-23 NOTE — Telephone Encounter (Signed)
Called & spoke to [place], the patient's granddaughter  (authorized to receive information per DPR on file). [name] verified that [employer] is the preferred company. Forms successfully faxed to [employer] on 12/23/2022.

## 2022-12-24 ENCOUNTER — Other Ambulatory Visit: Payer: Self-pay

## 2022-12-24 MED ORDER — DAPAGLIFLOZIN PROPANEDIOL 10 MG PO TABS: 10.0000 mg | ORAL_TABLET | Freq: Every day | ORAL | 3 refills | Status: DC

## 2022-12-24 MED ORDER — SACUBITRIL-VALSARTAN 24-26 MG PO TABS: 1.0000 | ORAL_TABLET | Freq: Two times a day (BID) | ORAL | 3 refills | Status: DC

## 2022-12-24 MED ORDER — SPIRONOLACTONE 25 MG PO TABS: 12.5000 mg | ORAL_TABLET | Freq: Every day | ORAL | 3 refills | Status: DC

## 2022-12-24 MED ORDER — CARVEDILOL 3.125 MG PO TABS: 3.1250 mg | ORAL_TABLET | Freq: Two times a day (BID) | ORAL | 3 refills | Status: DC

## 2023-01-03 ENCOUNTER — Other Ambulatory Visit: Payer: Self-pay | Admitting: Internal Medicine

## 2023-01-03 DIAGNOSIS — I10 Essential (primary) hypertension: Secondary | ICD-10-CM

## 2023-01-30 ENCOUNTER — Other Ambulatory Visit: Payer: Self-pay | Admitting: Internal Medicine

## 2023-01-30 DIAGNOSIS — I10 Essential (primary) hypertension: Secondary | ICD-10-CM

## 2023-01-30 NOTE — Telephone Encounter (Signed)
Unable to refill per protocol, Rx expired. Discontinued 08/24/22.  Requested Prescriptions  Pending Prescriptions Disp Refills   amlodipine-olmesartan (AZOR) 10-20 MG tablet [Pharmacy Med Name: AMLODIPINE-OLMESARTAN 10-20 MG] 90 tablet 1    Sig: TAKE 1 TABLET BY MOUTH EVERY DAY     Cardiovascular: CCB + ARB Combos Failed - 01/30/2023  8:34 AM      Failed - Cr in normal range and within 180 days    Creat  Date Value Ref Range Status  06/19/2016 1.04 (H) 0.60 - 0.93 mg/dL Final    Comment:      For patients > or = 85 years of age: The upper reference limit for Creatinine is approximately 13% higher for people identified as African-American.      Creatinine, Ser  Date Value Ref Range Status  09/25/2022 1.28 (H) 0.57 - 1.00 mg/dL Final         Passed - K in normal range and within 180 days    Potassium  Date Value Ref Range Status  09/25/2022 4.3 3.5 - 5.2 mmol/L Final         Passed - Na in normal range and within 180 days    Sodium  Date Value Ref Range Status  09/25/2022 139 134 - 144 mmol/L Final         Passed - Patient is not pregnant      Passed - Last BP in normal range    BP Readings from Last 1 Encounters:  11/29/22 134/64         Passed - Valid encounter within last 6 months    Recent Outpatient Visits           4 months ago Hospital discharge follow-up   [place], MD   10 months ago Essential hypertension   [place], Deborah B, MD   1 year ago Essential hypertension   [place], Deborah B, MD   1 year ago Essential hypertension   [place], MD   1 year ago Gait disturbance   [place], MD       Future Appointments             Tomorrow Ladell Pier, MD [place]   In 4 months Turner, Eber Hong, MD [place] [facility] [place] [facility]

## 2023-01-31 ENCOUNTER — Telehealth: Payer: Self-pay | Admitting: Internal Medicine

## 2023-01-31 ENCOUNTER — Ambulatory Visit: Payer: Medicare Other

## 2023-01-31 ENCOUNTER — Ambulatory Visit: Payer: 59 | Attending: Internal Medicine | Admitting: Internal Medicine

## 2023-01-31 ENCOUNTER — Other Ambulatory Visit: Payer: Self-pay

## 2023-01-31 VITALS — BP 108/50 | HR 36 | Ht 65.0 in | Wt 163.0 lb

## 2023-01-31 DIAGNOSIS — Z79899 Other long term (current) drug therapy: Secondary | ICD-10-CM | POA: Insufficient documentation

## 2023-01-31 DIAGNOSIS — R918 Other nonspecific abnormal finding of lung field: Secondary | ICD-10-CM | POA: Diagnosis not present

## 2023-01-31 DIAGNOSIS — Z7984 Long term (current) use of oral hypoglycemic drugs: Secondary | ICD-10-CM | POA: Diagnosis not present

## 2023-01-31 DIAGNOSIS — G4700 Insomnia, unspecified: Secondary | ICD-10-CM | POA: Diagnosis not present

## 2023-01-31 DIAGNOSIS — I4892 Unspecified atrial flutter: Secondary | ICD-10-CM | POA: Diagnosis not present

## 2023-01-31 DIAGNOSIS — D631 Anemia in chronic kidney disease: Secondary | ICD-10-CM | POA: Insufficient documentation

## 2023-01-31 DIAGNOSIS — M5416 Radiculopathy, lumbar region: Secondary | ICD-10-CM | POA: Insufficient documentation

## 2023-01-31 DIAGNOSIS — R7303 Prediabetes: Secondary | ICD-10-CM

## 2023-01-31 DIAGNOSIS — N838 Other noninflammatory disorders of ovary, fallopian tube and broad ligament: Secondary | ICD-10-CM

## 2023-01-31 DIAGNOSIS — M170 Bilateral primary osteoarthritis of knee: Secondary | ICD-10-CM

## 2023-01-31 DIAGNOSIS — Z7901 Long term (current) use of anticoagulants: Secondary | ICD-10-CM | POA: Diagnosis not present

## 2023-01-31 DIAGNOSIS — R001 Bradycardia, unspecified: Secondary | ICD-10-CM

## 2023-01-31 DIAGNOSIS — D638 Anemia in other chronic diseases classified elsewhere: Secondary | ICD-10-CM

## 2023-01-31 DIAGNOSIS — I5020 Unspecified systolic (congestive) heart failure: Secondary | ICD-10-CM

## 2023-01-31 DIAGNOSIS — I130 Hypertensive heart and chronic kidney disease with heart failure and stage 1 through stage 4 chronic kidney disease, or unspecified chronic kidney disease: Secondary | ICD-10-CM | POA: Diagnosis not present

## 2023-01-31 DIAGNOSIS — N1832 Chronic kidney disease, stage 3b: Secondary | ICD-10-CM

## 2023-01-31 DIAGNOSIS — I10 Essential (primary) hypertension: Secondary | ICD-10-CM

## 2023-01-31 DIAGNOSIS — I5181 Takotsubo syndrome: Secondary | ICD-10-CM | POA: Diagnosis not present

## 2023-01-31 MED ORDER — CARVEDILOL 3.125 MG PO TABS: 3.1250 mg | ORAL_TABLET | Freq: Every day | ORAL | 1 refills | Status: DC

## 2023-01-31 MED ORDER — ROSUVASTATIN CALCIUM 10 MG PO TABS: 10.0000 mg | ORAL_TABLET | Freq: Every day | ORAL | 1 refills | Status: DC

## 2023-01-31 NOTE — Patient Instructions (Signed)
Stop gabapentin. Decrease carvedilol to 1 tablet daily. You can purchase and try melatonin 3 mg daily over-the-counter. You will be called with an appointment for the CAT scan of the lungs.

## 2023-01-31 NOTE — Progress Notes (Unsigned)
Patient ID: Anna [name], female    DOB: [birth year]  MRN: [id number]  CC: Follow-up (Systolic congestive heart failure & other chronic conditions f/u. Med refill -gabapentin/Discuss spot found in lung. )   Subjective: Anna [name] is a 87 y.o. female who presents for chronic disease management.  Her GD Archie Pattenonya is with her. Her concerns today include:  history of atrial flutter on Eliquis with history of cardioversion 2015, HTN, trivial AR, mild MR,OA knees, HL, prediabetes, CKD 3, ACD, lumbar radiculopathy, hx of vertebral fx receiving pain management through Dr. Jos[facility]yler [facility].    Referred to gynecology on last visit for large ovary seen on advanced imaging.  Patient was called but declined scheduling  an appt.  Pt does not want to go through any more procedure or pelvic exam  Lung nodules: CT of the chest done during hospitalization in October revealed Incidental findings of mediastinal and right hilar lymphadenopathy likely reactive to pneumonia  and a 1.2 x 0.7 and 0.6 cm right upper lobe nodules. Radiologist recommended repeat CT in 3 to 6 months.  We had plan to order this today. Pt never smoke but around second hand smoke from husband who past 18 yrs ago. No chronic cough or SOB  Systolic CHF/stress-induced cardiomyopathy: Seen by cardiology 11/29/2022.  She was continued on Eliquis, carvedilol 3.125 mg twice a day, Entresto 25/26 mg twice a day, spironolactone 12.5 mg daily, furosemide 20 mg daily, Farxiga 10 mg daily and Crestor 10 mg 4 times a week (but GD states she is taking every day). -GD reports pt had episode of HA, tiredness twice last mth -Not resting well through the night.  Wakes up about every 2 hrs.  Takes 1.5 tabs Vicodin and 5 mg Flexeril at bedtime.  Wants to know whether she still needs to be on gabapentin or not.  She has not had it for the past several months.  CKD stage IIIb: Last GFR was 41.  She has ranged 40s to 50s.  Due for recheck today.  Appt  with neph schedule 03/19/23  Gait disturbance/OA knees: Prescription was sent to [facility] for bedside commode and adjustable height rolling food tray on last visit. [facility]ues to be [facility].  She has history of prediabetes.  She was on metformin but that was discontinued.  Now on Farxiga more so due to CHF.  Chronic Anemia: She has chronic anemia with hemoglobin ranging from 9.2-10.9.  Last hemoglobin was 10.9 in November of last year.  She is on iron once a day. Patient Active Problem List   Diagnosis Date Noted   Elevated troponin    Acute heart failure    NSTEMI (non-ST elevated myocardial infarction) 08/17/2022   Mitral stenosis 05/07/2021   Recurrent falls 04/24/2021   History of vertebral fracture 04/24/2021   Lumbar radiculopathy 04/24/2021   Anemia due to stage 3a chronic kidney disease 03/07/2020   Stage 3a chronic kidney disease 03/07/2020   Mixed incontinence urge and stress 03/09/2018   Prediabetes 03/09/2018   OA (osteoarthritis) of knee 03/09/2018   Sinus bradycardia 12/18/2016   Estrogen deficiency 07/03/2016   Mitral regurgitation 03/01/2014   Chronic atrial flutter 03/01/2014   Permanent atrial fibrillation 12/24/2013   Essential hypertension, benign 12/24/2013   Heart murmur 12/24/2013   Hyperlipemia 12/24/2013     Current Outpatient Medications on File Prior to Visit  Medication Sig Dispense Refill   carvedilol (COREG) 3.125 MG tablet Take 1  tablet (3.125 mg total) by mouth 2 (two) times daily with a meal. 180 tablet 3   Cholecalciferol (VITAMIN D3) 10 MCG (400 UNIT) tablet Take 2 tablets (800 Units total) by mouth daily. 120 tablet 2   cyclobenzaprine (FLEXERIL) 10 MG tablet Take 0.5 tablets (5 mg total) by mouth 2 (two) times daily as needed for muscle spasms. 20 tablet 0   dapagliflozin propanediol (FARXIGA) 10 MG TABS tablet Take 1 tablet (10 mg total) by mouth daily. 90 tablet 3   diclofenac Sodium (VOLTAREN) 1 %  GEL Apply 2 grams to knees 4 times a day as needed. 400 g 1   docusate sodium (COLACE) 100 MG capsule Take 100 mg by mouth daily.      ELIQUIS 5 MG TABS tablet TAKE 1 TABLET BY MOUTH TWICE A DAY 60 tablet 11   ferrous sulfate 325 (65 FE) MG tablet Take 1 tablet (325 mg total) by mouth daily with breakfast. 90 tablet 0   furosemide (LASIX) 20 MG tablet Take 1 tablet (20 mg total) by mouth daily. 90 tablet 3   gabapentin (NEURONTIN) 100 MG capsule TAKE 1 CAPSULE BY MOUTH THREE TIMES A DAY 270 capsule 0   HYDROcodone-acetaminophen (NORCO/VICODIN) 5-325 MG tablet Take 1 tablet by mouth every 6 (six) hours as needed for moderate pain.     Misc. Devices Paramedic. Devices MISC Rollator walker 1 Device 0   Misc. Devices MISC Hospital bed 1 Device 0   naloxone [facility]) nasal spray 4 mg/0.1 mL Place 1 spray into the nose once.     Omega-3 Fatty Acids (FISH OIL CONCENTRATE PO) Take 1 capsule by mouth daily.      rosuvastatin (CRESTOR) 10 MG tablet TAKE 1 TABLET BY MOUTH EVERY MONDAY, WEDNESDAY, FRIDAY, AND SATURDAY 48 tablet 1   sacubitril-valsartan (ENTRESTO) 24-26 MG Take 1 tablet by mouth 2 (two) times daily. 180 tablet 3   spironolactone (ALDACTONE) 25 MG tablet Take 0.5 tablets (12.5 mg total) by mouth daily. 45 tablet 3   White Petrolatum-Mineral Oil (SYSTANE NIGHTTIME) OINT Place 1 drop into the left eye 4 (four) times daily.     No current facility-administered medications on file prior to visit.    No Known Allergies  Social History   Socioeconomic History   Marital status: Divorced    Spouse name: Not on file   Number of children: 3   Years of education: Not on file   Highest education level: Not on file  Occupational History   Occupation: retired  Tobacco Use   Smoking status: Never   Smokeless tobacco: Never  Vaping Use   Vaping Use: Never used  Substance and Sexual Activity   Alcohol use: No   Drug use: No   Sexual activity: Not on file   Other Topics Concern   Not on file  Social History Narrative   Not on file   Social Determinants of Health   Financial Resource Strain: Low Risk  (08/21/2022)   Overall Financial Resource Strain (CARDIA)    Difficulty of Paying Living Expenses: Not very hard  Food Insecurity: No Food Insecurity (08/17/2022)   Hunger Vital Sign    Worried About Running Out of Food in the Last Year: Never true    Ran Out of Food in the Last Year: Never true  Transportation Needs: No Transportation Needs (08/17/2022)   PRAPARE - Transportation    Lack of Transportation (Medical): No    Lack of  Transportation (Non-Medical): No  Physical Activity: Insufficiently Active (07/15/2021)   Exercise Vital Sign    Days of Exercise per Week: 5 days    Minutes of Exercise per Session: 10 min  Stress: Not on file  Social Connections: Not on file  Intimate Partner Violence: Not At Risk (08/17/2022)   Humiliation, Afraid, Rape, and Kick questionnaire    Fear of Current or Ex-Partner: No    Emotionally Abused: No    Physically Abused: No    Sexually Abused: No    Family History  Problem Relation Age of Onset   Alzheimer's disease Mother     Past Surgical History:  Procedure Laterality Date   CARDIOVERSION N/A 03/24/2014   Procedure: CARDIOVERSION;  Surgeon: Quintella Reichert, MD;  Location: [facility] ENDOSCOPY;  Service: Cardiovascular;  Laterality: N/A;   LEFT HEART CATH AND CORONARY ANGIOGRAPHY N/A 08/20/2022   Procedure: LEFT HEART CATH AND CORONARY ANGIOGRAPHY;  Surgeon: Corky Crafts, MD;  Location: [facility] INVASIVE CV LAB;  Service: Cardiovascular;  Laterality: N/A;    ROS: Review of Systems Negative except as stated above  PHYSICAL EXAM: BP (!) 108/50 (BP Location: Left Arm, Patient Position: Sitting, Cuff Size: Normal)   Pulse (!) 36   Ht 5\' 5"  (1.651 m)   Wt 163 lb (73.9 kg)   SpO2 99%   BMI 27.12 kg/m   Physical Exam  General appearance - alert, pleasant frail appearing elderly Caucasian  female sitting in wheelchair  and in no distress Mental status - normal mood, behavior, speech, dress, motor activity, and thought processes Neck - supple, no significant adenopathy Chest - clear to auscultation, no wheezes, rales or rhonchi, symmetric air entry Heart - normal rate, regular rhythm, normal S1, S2, no murmurs, rubs, clicks or gallops Extremities -trace lower extremity edema.      Latest Ref Rng & Units 09/25/2022    2:48 PM 09/13/2022    3:44 PM 09/04/2022    2:57 PM  CMP  Glucose 70 - 99 mg/dL 035  81  91   BUN 8 - 27 mg/dL 29  15  11    Creatinine 0.57 - 1.00 mg/dL 5.97  4.16  3.84   Sodium 134 - 144 mmol/L 139  138  137   Potassium 3.5 - 5.2 mmol/L 4.3  4.3  4.3   Chloride 96 - 106 mmol/L 96  100  107   CO2 20 - 29 mmol/L 27  24  23    Calcium 8.7 - 10.3 mg/dL 53.6  9.8  9.3   Total Protein 6.5 - 8.1 g/dL   6.7   Total Bilirubin 0.3 - 1.2 mg/dL   0.6   Alkaline Phos 38 - 126 U/L   65   AST 15 - 41 U/L   22   ALT 0 - 44 U/L   11    Lipid Panel     Component Value Date/Time   CHOL 69 08/18/2022 0620   CHOL 106 03/28/2022 1613   TRIG 62 08/18/2022 0620   HDL 33 (L) 08/18/2022 0620   HDL 49 03/28/2022 1613   CHOLHDL 2.1 08/18/2022 0620   VLDL 12 08/18/2022 0620   LDLCALC 24 08/18/2022 0620   LDLCALC 44 03/28/2022 1613    CBC    Component Value Date/Time   WBC 4.6 09/04/2022 1457   RBC 3.85 (L) 09/04/2022 1457   HGB 10.9 (L) 09/04/2022 1457   HGB 11.6 03/28/2022 1613   HCT 34.2 (L) 09/04/2022 1457   HCT  35.6 03/28/2022 1613   PLT 212 09/04/2022 1457   PLT 253 03/28/2022 1613   MCV 88.8 09/04/2022 1457   MCV 88 03/28/2022 1613   MCH 28.3 09/04/2022 1457   MCHC 31.9 09/04/2022 1457   RDW 14.6 09/04/2022 1457   RDW 12.7 03/28/2022 1613   LYMPHSABS 0.7 08/21/2022 0227   LYMPHSABS 1.9 12/18/2016 1452   MONOABS 0.5 08/21/2022 0227   EOSABS 0.0 08/21/2022 0227   EOSABS 0.2 12/18/2016 1452   BASOSABS 0.0 08/21/2022 0227   BASOSABS 0.1 12/18/2016 1452     ASSESSMENT AND PLAN: 1. Essential hypertension At goal.  Continue current medications listed above except for change in dose of carvedilol to once daily dosing due to significant bradycardia. - Comprehensive metabolic panel  2. Systolic congestive heart failure, unspecified HF chronicity Stable and compensated.  Continue Farxiga, furosemide 20 mg daily, spironolactone 25 mg half a tablet daily, Entresto 24/26 mg 1 tablet twice a day. Pulse rate rechecked twice his 30s to 40s. I had her decrease carvedilol to 3.125 mg once a day. - rosuvastatin (CRESTOR) 10 MG tablet; Take 1 tablet (10 mg total) by mouth daily.  Dispense: 90 tablet; Refill: 1 - carvedilol (COREG) 3.125 MG tablet; Take 1 tablet (3.125 mg total) by mouth daily.  Dispense: 90 tablet; Refill: 1  3. Stress-induced cardiomyopathy See #2 above.  4. Stage 3b chronic kidney disease Stable.  We will continue to monitor.  5. Lung nodules We have ordered CT scan without contrast for follow-up on this.  6. Primary osteoarthritis of both knees Followed by [facility] for pain management.  7. Anemia, chronic disease - CBC  8. Insomnia, unspecified type Good sleep hygiene discussed and encouraged. Patient advised not to drink any caffeinated beverages or excessive alcohol use within several hours of bedtime.  Advised to get in bed around about the same time every night.  Once in bed, turn off all lights and sounds.  If unable to fall asleep within 30 to 45 minutes of getting in bed, patient should get up and try to do something until she feels sleepy again.  At that time try getting back in bed. -Trying melatonin 3 mg over-the-counter.  Can also try sleepy time tea which can be purchased over-the-counter.  9. Large ovary Patient declines any further workup on this.  10. Bradycardia See #2 above.  11. Prediabetes - Hemoglobin A1c     Patient was given the opportunity to ask questions.  Patient verbalized  understanding of the plan and was able to repeat key elements of the plan.   This documentation was completed using Paediatric nurse.  Any transcriptional errors are unintentional.  No orders of the defined types were placed in this encounter.    Requested Prescriptions    No prescriptions requested or ordered in this encounter    No follow-ups on file.  Jonah Blue, MD, FACP

## 2023-01-31 NOTE — Telephone Encounter (Signed)
Pt's granddaughter is calling on behalf of pt. Pt was scheduled for a CT scan and when they reached out to the location they were told because pt is in a wheelchair and can't transport themselves on the bed the appointment would need to be scheduled at the hospital because referred location doesn't have the lower beds. Please follow up with [name].

## 2023-01-31 NOTE — Telephone Encounter (Signed)
Call placed to patient unable to reach message left on VM.   

## 2023-01-31 NOTE — Telephone Encounter (Signed)
Pt's grandaughter called back about having Chest exam order scheduled for hospital because of her being in wheelchair and can't get on table at appt today with imaging

## 2023-02-01 LAB — COMPREHENSIVE METABOLIC PANEL
ALT: 7 IU/L (ref 0–32)
AST: 17 IU/L (ref 0–40)
Albumin/Globulin Ratio: 1.8 (ref 1.2–2.2)
Albumin: 4.4 g/dL (ref 3.7–4.7)
Alkaline Phosphatase: 81 IU/L (ref 44–121)
BUN/Creatinine Ratio: 27 (ref 12–28)
BUN: 34 mg/dL — ABNORMAL HIGH (ref 8–27)
Bilirubin Total: 0.4 mg/dL (ref 0.0–1.2)
CO2: 23 mmol/L (ref 20–29)
Calcium: 9.9 mg/dL (ref 8.7–10.3)
Chloride: 99 mmol/L (ref 96–106)
Creatinine, Ser: 1.27 mg/dL — ABNORMAL HIGH (ref 0.57–1.00)
Globulin, Total: 2.4 g/dL (ref 1.5–4.5)
Glucose: 107 mg/dL — ABNORMAL HIGH (ref 70–99)
Potassium: 4.8 mmol/L (ref 3.5–5.2)
Sodium: 138 mmol/L (ref 134–144)
Total Protein: 6.8 g/dL (ref 6.0–8.5)
eGFR: 41 mL/min/{1.73_m2} — ABNORMAL LOW (ref >59)

## 2023-02-01 LAB — CBC
Hematocrit: 34.6 % (ref 34.0–46.6)
Hemoglobin: 11.4 g/dL (ref 11.1–15.9)
MCH: 28.5 pg (ref 26.6–33.0)
MCHC: 32.9 g/dL (ref 31.5–35.7)
MCV: 87 fL (ref 79–97)
Platelets: 240 10*3/uL (ref 150–450)
RBC: 4 x10E6/uL (ref 3.77–5.28)
RDW: 12.6 % (ref 11.7–15.4)
WBC: 5.5 10*3/uL (ref 3.4–10.8)

## 2023-02-01 LAB — HEMOGLOBIN A1C
Est. average glucose Bld gHb Est-mCnc: 148 mg/dL
Hgb A1c MFr Bld: 6.8 % — ABNORMAL HIGH (ref 4.8–5.6)

## 2023-02-01 NOTE — Progress Notes (Signed)
Let patient's granddaughter [name], know that patient's kidney function is not at 100% but stable compared to when last checked in November.  Liver function test normal.  Blood cell counts improved and in the low normal range.  Continue iron.

## 2023-02-02 ENCOUNTER — Encounter: Payer: Self-pay | Admitting: Internal Medicine

## 2023-02-03 NOTE — Telephone Encounter (Addendum)
Patient's  daughter is aware And calling call for appointment.

## 2023-02-19 ENCOUNTER — Telehealth: Payer: Self-pay | Admitting: Internal Medicine

## 2023-02-19 NOTE — Telephone Encounter (Signed)
Phone call placed to patient's granddaughter [name].  Advised that I was completing form sent to me from [facility] [employer].  I wanted to confirm that they still wanted me to complete this for the patient to be enrolled in the Program and she stated yes.  There was a question on the form that ask whether her caregiver lives with her and if not how far away.  Granddaughter stated that she does not live with the patient.  She Lives about [address] [place].

## 2023-02-21 ENCOUNTER — Ambulatory Visit (HOSPITAL_COMMUNITY)
Admission: RE | Admit: 2023-02-21 | Discharge: 2023-02-21 | Disposition: A | Discharge status: Home / Self Care | Payer: 59 | Source: Ambulatory Visit | Attending: Internal Medicine | Admitting: Internal Medicine

## 2023-02-21 ENCOUNTER — Ambulatory Visit: Payer: 59 | Attending: Internal Medicine

## 2023-02-21 DIAGNOSIS — Z0000 Encounter for general adult medical examination without abnormal findings: Secondary | ICD-10-CM | POA: Diagnosis not present

## 2023-02-21 DIAGNOSIS — R918 Other nonspecific abnormal finding of lung field: Secondary | ICD-10-CM | POA: Diagnosis present

## 2023-02-21 NOTE — Progress Notes (Signed)
Subjective:   Anna [name] is a 87 y.o. female who presents for Medicare Annual (Subsequent) preventive examination.  Review of Systems     I connected with  on  at  by telephone and verified that I am speaking with the correct person using two identifiers. I discussed the limitations, risks, security and privacy concerns of performing an evaluation and management service by telephone and the availability of in person appointments. I also discussed with the patient that there may be a patient responsible charge related to this service. The patient expressed understanding and agreed to proceed.   Patient location: Home My Location: [facility] Persons on the telephone call: Myself,Patient and [name](granddaughter)         Objective:    There were no vitals filed for this visit. There is no height or weight on file to calculate BMI.     02/21/2023    3:06 PM 08/17/2022   10:09 AM 07/15/2021    2:19 PM 04/11/2021    2:07 PM 03/15/2021   12:44 AM 03/17/2017    2:08 PM 09/03/2016    2:33 PM  Advanced Directives  Does Patient Have a Medical Advance Directive? Yes No Yes Yes No Yes Yes  Type of Advance Directive Living will;Healthcare Power of Attorney  Living will   Living will Living will  Does patient want to make changes to medical advance directive?   Yes (ED - send information to MyChart) No - Patient declined     Would patient like information on creating a medical advance directive?  No - Patient declined   No - Patient declined      Current Medications (verified) Outpatient Encounter Medications as of 02/21/2023  Medication Sig   carvedilol (COREG) 3.125 MG tablet Take 1 tablet (3.125 mg total) by mouth daily.   Cholecalciferol (VITAMIN D3) 10 MCG (400 UNIT) tablet Take 2 tablets (800 Units total) by mouth daily.   cyclobenzaprine (FLEXERIL) 10 MG tablet Take 0.5 tablets (5 mg total) by mouth 2 (two) times daily as needed for muscle spasms.    dapagliflozin propanediol (FARXIGA) 10 MG TABS tablet Take 1 tablet (10 mg total) by mouth daily.   diclofenac Sodium (VOLTAREN) 1 % GEL Apply 2 grams to knees 4 times a day as needed.   docusate sodium (COLACE) 100 MG capsule Take 100 mg by mouth daily.    ELIQUIS 5 MG TABS tablet TAKE 1 TABLET BY MOUTH TWICE A DAY   ferrous sulfate 325 (65 FE) MG tablet Take 1 tablet (325 mg total) by mouth daily with breakfast.   furosemide (LASIX) 20 MG tablet Take 1 tablet (20 mg total) by mouth daily.   HYDROcodone-acetaminophen (NORCO/VICODIN) 5-325 MG tablet Take 1 tablet by mouth every 6 (six) hours as needed for moderate pain.   Misc. Devices Event organiser. Devices MISC Rollator walker   Misc. Devices MISC Hospital bed   naloxone [facility]) nasal spray 4 mg/0.1 mL Place 1 spray into the nose once.   Omega-3 Fatty Acids (FISH OIL CONCENTRATE PO) Take 1 capsule by mouth daily.    rosuvastatin (CRESTOR) 10 MG tablet Take 1 tablet (10 mg total) by mouth daily.   sacubitril-valsartan (ENTRESTO) 24-26 MG Take 1 tablet by mouth 2 (two) times daily.   spironolactone (ALDACTONE) 25 MG tablet Take 0.5 tablets (12.5 mg total) by mouth daily.   White Petrolatum-Mineral Oil (SYSTANE NIGHTTIME) OINT Place 1 drop into the left eye 4 (  four) times daily.   No facility-administered encounter medications on file as of 02/21/2023.    Allergies (verified) Patient has no known allergies.   History: Past Medical History:  Diagnosis Date   Hyperlipidemia    Hypertension    Mitral regurgitation    mild to moderate by echo 04/2022   Mitral stenosis    mild to moderate by echo 09/2022   Osteoarthritis    Overweight(278.02)    Permanent atrial fibrillation [facility])    Past Surgical History:  Procedure Laterality Date   CARDIOVERSION N/A 03/24/2014   Procedure: CARDIOVERSION;  Surgeon: Quintella Reichert, MD;  Location: [facility] ENDOSCOPY;  Service: Cardiovascular;  Laterality: N/A;   LEFT HEART CATH AND  CORONARY ANGIOGRAPHY N/A 08/20/2022   Procedure: LEFT HEART CATH AND CORONARY ANGIOGRAPHY;  Surgeon: Corky Crafts, MD;  Location: [facility] INVASIVE CV LAB;  Service: Cardiovascular;  Laterality: N/A;   Family History  Problem Relation Age of Onset   Alzheimer's disease Mother    Social History   Socioeconomic History   Marital status: Divorced    Spouse name: Not on file   Number of children: 3   Years of education: Not on file   Highest education level: Not on file  Occupational History   Occupation: retired  Tobacco Use   Smoking status: Never   Smokeless tobacco: Never  Vaping Use   Vaping Use: Never used  Substance and Sexual Activity   Alcohol use: No   Drug use: No   Sexual activity: Not on file  Other Topics Concern   Not on file  Social History Narrative   Not on file   Social Determinants of Health   Financial Resource Strain: Low Risk  (02/21/2023)   Overall Financial Resource Strain (CARDIA)    Difficulty of Paying Living Expenses: Not hard at all  Food Insecurity: No Food Insecurity (02/21/2023)   Hunger Vital Sign    Worried About Running Out of Food in the Last Year: Never true    Ran Out of Food in the Last Year: Never true  Transportation Needs: No Transportation Needs (02/21/2023)   PRAPARE - Administrator, Civil Service (Medical): No    Lack of Transportation (Non-Medical): No  Physical Activity: Inactive (02/21/2023)   Exercise Vital Sign    Days of Exercise per Week: 0 days    Minutes of Exercise per Session: 0 min  Stress: No Stress Concern Present (02/21/2023)   [employer] of Occupational Health - Occupational Stress Questionnaire    Feeling of Stress : Not at all  Social Connections: Not on file    Tobacco Counseling Counseling given: Not Answered   Clinical Intake:  Pre-visit preparation completed: No  Pain : No/denies pain     Diabetes: No CBG done?: No  How often do you need to have someone help you when  you read instructions, pamphlets, or other written materials from your doctor or pharmacy?: 1 - Never  Diabetic?NO  Interpreter Needed?: No      Activities of Daily Living    02/21/2023    3:07 PM 08/17/2022    9:48 PM  In your present state of health, do you have any difficulty performing the following activities:  Hearing? 0 1  Vision? 1 0  Difficulty concentrating or making decisions? 0 0  Walking or climbing stairs? 1 1  Dressing or bathing? 1 1  Doing errands, shopping? 1 1  Comment  Daughter Multimedia programmer and eating ?  N   Using the Toilet? N   In the past six months, have you accidently leaked urine? Y   Do you have problems with loss of bowel control? N   Managing your Medications? N   Managing your Finances? N   Housekeeping or managing your Housekeeping? N     Patient Care Team: Marcine Matar, MD as PCP - General (Internal Medicine) Quintella Reichert, MD as PCP - Cardiology (Cardiology)  Indicate any recent Medical Services you may have received from other than [facility] providers in the past year (date may be approximate).     Assessment:   This is a routine wellness examination for Burgin.  Hearing/Vision screen No results found.  Dietary issues and exercise activities discussed: Current Exercise Habits: The patient does not participate in regular exercise at present   Goals Addressed   None    Depression Screen    02/21/2023    3:06 PM 01/31/2023    2:13 PM 01/31/2023    2:12 PM 09/27/2022    2:23 PM 03/28/2022    2:29 PM 07/20/2021    4:11 PM 07/15/2021    2:06 PM  PHQ 2/9 Scores  PHQ - 2 Score 0 0 0 0 0 0 0  PHQ- 9 Score  1  0 0 0 0    Fall Risk    02/21/2023    3:06 PM 09/27/2022    2:09 PM 03/28/2022    2:28 PM 07/20/2021    4:11 PM 07/15/2021    2:07 PM  Fall Risk   Falls in the past year? 0 0 1 1 1   Number falls in past yr: 0 0 1 1 1   Comment     07/03/21  Injury with Fall? 0 0 0 0 1  Comment     Bruise Left side leg  Risk for fall  due to : No Fall Risks No Fall Risks Impaired mobility;Impaired balance/gait Other (Comment)   Follow up   Falls evaluation completed  Falls evaluation completed    FALL RISK PREVENTION PERTAINING TO THE HOME:  Any stairs in or around the home? No  If so, are there any without handrails? No  Home free of loose throw rugs in walkways, pet beds, electrical cords, etc? Yes  Adequate lighting in your home to reduce risk of falls? Yes   ASSISTIVE DEVICES UTILIZED TO PREVENT FALLS:  Life alert? Yes  Use of a cane, walker or w/c? Yes  Grab bars in the bathroom? Yes  Shower chair or bench in shower? Yes  Elevated toilet seat or a handicapped toilet? Yes   TIMED UP AND GO:  Was the test performed? No .  Length of time to ambulate 10 feet: N/A sec.   Gait unsteady without use of assistive device, provider informed and interventions were implemented  Cognitive Function:    02/21/2023    3:08 PM  MMSE - Mini Mental State Exam  Not completed: Unable to complete        07/15/2021    2:10 PM  6CIT Screen  What Year? 0 points  What month? 0 points  What time? 0 points  Count back from 20 0 points  Months in reverse 2 points  Repeat phrase 0 points  Total Score 2 points    Immunizations Immunization History  Administered Date(s) Administered   Influenza, High Dose Seasonal PF 07/10/2018, 07/19/2019, 08/18/2022   Influenza,inj,Quad PF,6+ Mos 06/25/2016, 09/16/2017, 07/07/2020, 07/20/2021   Janssen (J&J) SARS-COV-2 Vaccination  02/07/2020   PFIZER(Purple Top)SARS-COV-2 Vaccination 03/15/2021   Pneumococcal Conjugate-13 09/16/2017   Pneumococcal Polysaccharide-23 03/28/2022   Tdap 03/17/2017    TDAP status: Up to date  Flu Vaccine status: Up to date  Pneumococcal vaccine status: Up to date  Covid-19 vaccine status: Completed vaccines  Qualifies for Shingles Vaccine? Yes   Zostavax completed Yes   Shingrix Completed?: Yes  Screening Tests Health Maintenance  Topic  Date Due   Zoster Vaccines- Shingrix (1 of 2) Never done   COVID-19 Vaccine (3 - 2023-24 season) 06/28/2022   Medicare Annual Wellness (AWV)  08/01/2022   DEXA SCAN  03/29/2023 (Originally 11/18/2002)   INFLUENZA VACCINE  05/29/2023   DTaP/Tdap/Td (2 - Td or Tdap) 03/18/2027   Pneumonia Vaccine 54+ Years old  Completed   HPV VACCINES  Aged Out    Health Maintenance  Health Maintenance Due  Topic Date Due   Zoster Vaccines- Shingrix (1 of 2) Never done   COVID-19 Vaccine (3 - 2023-24 season) 06/28/2022   Medicare Annual Wellness (AWV)  08/01/2022    Colorectal cancer screening: No longer required.   Mammogram status: No longer required due to Age.  Lung Cancer Screening: (Low Dose CT Chest recommended if Age 85-80 years, 30 pack-year currently smoking OR have quit w/in 15years.) does not qualify.   Lung Cancer Screening Referral: N/A  Additional Screening:  Hepatitis C Screening: does not qualify  Vision Screening: Recommended annual ophthalmology exams for early detection of glaucoma and other disorders of the eye. Is the patient up to date with their annual eye exam?  Yes  Who is the provider or what is the name of the office in which the patient attends annual eye exams? [facility] If pt is not established with a provider, would they like to be referred to a provider to establish care? No .   Dental Screening: Recommended annual dental exams for proper oral hygiene  Community Resource Referral / Chronic Care Management: CRR required this visit?  No   CCM required this visit?  No      Plan:     I have personally reviewed and noted the following in the patient's chart:   Medical and social history Use of alcohol, tobacco or illicit drugs  Current medications and supplements including opioid prescriptions. Patient is currently taking opioid prescriptions. Information provided to patient regarding non-opioid alternatives. Patient advised to discuss non-opioid  treatment plan with their provider. Functional ability and status Nutritional status Physical activity Advanced directives List of other physicians Hospitalizations, surgeries, and ER visits in previous 12 months Vitals Screenings to include cognitive, depression, and falls Referrals and appointments  In addition, I have reviewed and discussed with patient certain preventive protocols, quality metrics, and best practice recommendations. A written personalized care plan for preventive services as well as general preventive health recommendations were provided to patient.     Ronette Deter, CMA   02/21/2023   Nurse Notes: I spent 15 minutes on this telephone encounter AVS mailed to patinet

## 2023-02-21 NOTE — Patient Instructions (Addendum)
Anna [name] , Thank you for taking time to come for your Medicare Wellness Visit. I appreciate your ongoing commitment to your health goals. Please review the following plan we discussed and let me know if I can assist you in the future.   These are the goals we discussed:  Goals   None     This is a list of the screening recommended for you and due dates:  Health Maintenance  Topic Date Due   Zoster (Shingles) Vaccine (1 of 2) Never done   COVID-19 Vaccine (3 - 2023-24 season) 06/28/2022   DEXA scan (bone density measurement)  03/29/2023*   Flu Shot  05/29/2023   Medicare Annual Wellness Visit  02/21/2024   DTaP/Tdap/Td vaccine (2 - Td or Tdap) 03/18/2027   Pneumonia Vaccine  Completed   HPV Vaccine  Aged Out  *Topic was postponed. The date shown is not the original due date.   Health Maintenance After Age 13 After age 40, you are at a higher risk for certain long-term diseases and infections as well as injuries from falls. Falls are a major cause of broken bones and head injuries in people who are older than age 40. Getting regular preventive care can help to keep you healthy and well. Preventive care includes getting regular testing and making lifestyle changes as recommended by your health care provider. Talk with your health care provider about: Which screenings and tests you should have. A screening is a test that checks for a disease when you have no symptoms. A diet and exercise plan that is right for you. What should I know about screenings and tests to prevent falls? Screening and testing are the best ways to find a health problem early. Early diagnosis and treatment give you the best chance of managing medical conditions that are common after age 46. Certain conditions and lifestyle choices may make you more likely to have a fall. Your health care provider may recommend: Regular vision checks. Poor vision and conditions such as cataracts can make you more likely to have a fall.  If you wear glasses, make sure to get your prescription updated if your vision changes. Medicine review. Work with your health care provider to regularly review all of the medicines you are taking, including over-the-counter medicines. Ask your health care provider about any side effects that may make you more likely to have a fall. Tell your health care provider if any medicines that you take make you feel dizzy or sleepy. Strength and balance checks. Your health care provider may recommend certain tests to check your strength and balance while standing, walking, or changing positions. Foot health exam. Foot pain and numbness, as well as not wearing proper footwear, can make you more likely to have a fall. Screenings, including: Osteoporosis screening. Osteoporosis is a condition that causes the bones to get weaker and break more easily. Blood pressure screening. Blood pressure changes and medicines to control blood pressure can make you feel dizzy. Depression screening. You may be more likely to have a fall if you have a fear of falling, feel depressed, or feel unable to do activities that you used to do. Alcohol use screening. Using too much alcohol can affect your balance and may make you more likely to have a fall. Follow these instructions at home: Lifestyle Do not drink alcohol if: Your health care provider tells you not to drink. If you drink alcohol: Limit how much you have to: 0-1 drink a day for women. 0-2  drinks a day for men. Know how much alcohol is in your drink. In the [place] one drink equals one 12 oz bottle of beer (355 mL), one 5 oz glass of wine (148 mL), or one 1 oz glass of hard liquor (44 mL). Do not use any products that contain nicotine or tobacco. These products include cigarettes, chewing tobacco, and vaping devices, such as e-cigarettes. If you need help quitting, ask your health care provider. Activity  Follow a regular exercise program to stay fit. This will help  you maintain your balance. Ask your health care provider what types of exercise are appropriate for you. If you need a cane or walker, use it as recommended by your health care provider. Wear supportive shoes that have nonskid soles. Safety  Remove any tripping hazards, such as rugs, cords, and clutter. Install safety equipment such as grab bars in bathrooms and safety rails on stairs. Keep rooms and walkways well-lit. General instructions Talk with your health care provider about your risks for falling. Tell your health care provider if: You fall. Be sure to tell your health care provider about all falls, even ones that seem minor. You feel dizzy, tiredness (fatigue), or off-balance. Take over-the-counter and prescription medicines only as told by your health care provider. These include supplements. Eat a healthy diet and maintain a healthy weight. A healthy diet includes low-fat dairy products, low-fat (lean) meats, and fiber from whole grains, beans, and lots of fruits and vegetables. Stay current with your vaccines. Schedule regular health, dental, and eye exams. Summary Having a healthy lifestyle and getting preventive care can help to protect your health and wellness after age 38. Screening and testing are the best way to find a health problem early and help you avoid having a fall. Early diagnosis and treatment give you the best chance for managing medical conditions that are more common for people who are older than age 17. Falls are a major cause of broken bones and head injuries in people who are older than age 74. Take precautions to prevent a fall at home. Work with your health care provider to learn what changes you can make to improve your health and wellness and to prevent falls. This information is not intended to replace advice given to you by your health care provider. Make sure you discuss any questions you have with your health care provider. Document Revised: 03/05/2021  Document Reviewed: 03/05/2021 Elsevier Patient Education  2023 [employer].

## 2023-02-22 ENCOUNTER — Other Ambulatory Visit: Payer: Self-pay | Admitting: Internal Medicine

## 2023-02-22 DIAGNOSIS — I10 Essential (primary) hypertension: Secondary | ICD-10-CM

## 2023-02-24 NOTE — Telephone Encounter (Signed)
Forms and requested information has been successfully faxed to [facility].

## 2023-02-27 ENCOUNTER — Other Ambulatory Visit: Payer: Self-pay | Admitting: Family Medicine

## 2023-02-27 DIAGNOSIS — J849 Interstitial pulmonary disease, unspecified: Secondary | ICD-10-CM

## 2023-03-04 ENCOUNTER — Telehealth: Payer: Self-pay | Admitting: Internal Medicine

## 2023-03-04 DIAGNOSIS — M170 Bilateral primary osteoarthritis of knee: Secondary | ICD-10-CM

## 2023-03-04 DIAGNOSIS — R269 Unspecified abnormalities of gait and mobility: Secondary | ICD-10-CM

## 2023-03-04 NOTE — Telephone Encounter (Signed)
FYI

## 2023-03-04 NOTE — Telephone Encounter (Signed)
Hilda from [facility] NP called to share findings: [employer]) today   Did a PAD screening test:  Right leg 0.57 Left leg 0.36 Both reveal she has moderate circulation on her legs  Best contact: [phone]

## 2023-03-05 NOTE — Telephone Encounter (Signed)
Phone call placed to patient's caregiver [name].  I informed her of the results of screening ABI that was done by a nurse practitioner from her insurance on recent home visit.  Advised it suggest that she may have poor circulation in her legs.  I inquired whether patient has complained of any pain in the calf and/or feet at rest or with ambulation or has noted any purple or blue discoloration to the legs/feet.  She stated that patient always has pain legs and knees but she will pay more attention to see whether she complains of any pain in the calf or feet at rest and/or with ambulation.  She will also observe to see if there is any discoloration in the legs/feet.  If so she will let me know.  Advised that we usually give her referral to vascular surgeon for further evaluation.  However given patient's age, decreased mobility and lack of suggestive symptoms at this time we will hold off for now.  [name] is in agreement with this. She requested a transfer board for the patient so that she is able to transfer safely from her bed to her wheelchair.  Advised that I will send a prescription to a medical supply store for it.  If it is denied, we will have to do a face-to-face visit.  She expressed understanding.

## 2023-03-11 ENCOUNTER — Other Ambulatory Visit: Payer: Self-pay | Admitting: Internal Medicine

## 2023-03-11 DIAGNOSIS — I10 Essential (primary) hypertension: Secondary | ICD-10-CM

## 2023-03-19 ENCOUNTER — Telehealth: Payer: Self-pay | Admitting: Internal Medicine

## 2023-03-19 NOTE — Telephone Encounter (Signed)
Medication Refill - Medication:  gabapentin (NEURONTIN) 100 MG capsule    Has the patient contacted their pharmacy? No.   (Preferred Pharmacy (with phone number or street name):  [facility] [phone] [place], [place] - [address]  [address] [place] [address]  Phone: [phone] Fax: [phone]  Hours: Not open 24 hours     Has the patient been seen for an appointment in the last year OR does the patient have an upcoming appointment? Yes.    Agent: Please be advised that RX refills may take up to 3 business days. We ask that you follow-up with your pharmacy.

## 2023-03-21 NOTE — Telephone Encounter (Signed)
Spoke with patient 's granddaughter Anna [name]). Verified name & DOB  of pt.  Anna [name] is going to call grandmother to see who fill gabapentin last  and request a refill, because the patient just has been taking daily.

## 2023-04-10 ENCOUNTER — Encounter: Payer: Self-pay | Admitting: Internal Medicine

## 2023-04-10 ENCOUNTER — Ambulatory Visit (INDEPENDENT_AMBULATORY_CARE_PROVIDER_SITE_OTHER): Payer: 59 | Admitting: Internal Medicine

## 2023-04-10 VITALS — BP 120/58 | HR 51 | Ht 65.0 in | Wt 164.4 lb

## 2023-04-10 DIAGNOSIS — R9389 Abnormal findings on diagnostic imaging of other specified body structures: Secondary | ICD-10-CM

## 2023-04-10 DIAGNOSIS — Z8701 Personal history of pneumonia (recurrent): Secondary | ICD-10-CM

## 2023-04-10 NOTE — Patient Instructions (Addendum)
ICD-10-CM   1. History of pneumonia  Z87.01      You had pneumonia in October 2023.  On the latest scan April 2024 this pneumonia has completely resolved.  I am not so sure you have interstitial lung disease.  If at all you have extremely mild.  Based on the scan features the risk of progression is extremely low.  It appears your goals of care is for symptom based supportive care approach.  I fully support this  Plan - Discharged from follow-up.  Return as needed -You can consider stopping fish oil which tends to increase acid reflux risk -Make sure you get the RSV vaccine and you have staying up-to-date with all your vaccines. -Stay away from sick people and avoid human clusters

## 2023-04-10 NOTE — Progress Notes (Signed)
OV 04/10/2023  Subjective:  Patient ID: Anna [name], female , DOB: 02-May-1936 , age 87 y.o. , MRN: [id number] , ADDRESS: [address] [place] [address] PCP Anna Matar, MD Patient Care Team: Anna Matar, MD as PCP - General (Internal Medicine) Anna Reichert, MD as PCP - Cardiology (Cardiology)  This Provider for this visit: Treatment Team:  Attending Provider: Kalman Shan, MD    04/10/2023 -   Chief Complaint  Patient presents with   Consult    Consult on possible ILD, no sob according to granddaughter, CT scan.     HPI Anna [name] 87 y.o. -is a new consult for evaluation of interstitial lung disease.  She says she used to drink heavily beer every day.  She also used to smoke very little cigarettes for pleasure when she used to drink.  She quit drinking 7 years ago.  However over time she got frail and by late 2022 started using wheelchair and a walker.  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  She lives in bed to chair existence.  She can go take care of her activities of daily living such as toiletries and changing clothes and eating with the help of a walker but most of the time is just sitting in a chair also for bed.  ECOG is essentially 4.  But she is cognitively alert.  Alcohol use is in remission.  Then in October 2023 [according to patient history and independent history of the granddaughter Anna [name] and the daughter Anna [name]] she did get admitted with respiratory complaints of cough green sputum and was diagnosed with new onset systolic heart failure [echo reviewed and ejection fraction low] and also pneumonia.  CT chest shows bilateral lower lobe right greater than left lower lobe infiltrates.  She got better and went home and she is back to baseline currently  Follow-up CT scan of the chest 02/21/2023 personally visualized and the pneumonia has resolved.   However radiology is concerned there might be interstitial lung disease particularly in the upper and mid zone but patient is completely asymptomatic in her baseline ECOG 4 state.  She does not want any workup.  In my personal interpretation there is only trace ILD such as residual scarring.  Patient does not even want blood test.  She says she is fine and she just wants to have expectant follow-up and supportive care.  I am fully aligned with this.  CT sinus without contrast 02/01/2023-I personally visualized the CT chest.  I also showed it to the daughter and the granddaughter.  There is significant improvement in pneumonia.  There are some mild residual interstitial abnormalities in the upper and mid zones.  It is extremely faint.  This does not fit in with NSIP pattern.  Definitely not UIP.  It is alternative to all that.  Is extremely mild  Creatinine 1.27 mg percent February 20, 2023 Echo December 2023 with improvement in ejection fraction   tive & Impression  CLINICAL DATA:  Lung nodule.   EXAM: CT CHEST WITHOUT CONTRAST   TECHNIQUE: Multidetector CT imaging of the chest was performed following the standard protocol without IV contrast.   RADIATION DOSE REDUCTION: This exam was performed according to the departmental dose-optimization program which includes automated exposure control, adjustment of the mA and/or kV according to patient size and/or use of iterative reconstruction technique.  COMPARISON:  08/17/2022.   FINDINGS: Cardiovascular: Atherosclerotic calcification of the aorta, aortic valve and coronary arteries. Enlarged pulmonic trunk and heart. No pericardial effusion.   Mediastinum/Nodes: No pathologically enlarged mediastinal or axillary lymph nodes. Hilar regions are difficult to definitively evaluate without IV contrast. Esophagus is grossly unremarkable.   Lungs/Pleura: Biapical pleuroparenchymal scarring. Mild interstitial thickening with subpleural reticular  densities and ground-glass, upper and midlung zone predominant. Scattered pulmonary parenchymal scarring. No suspicious pulmonary nodules. No pleural fluid. Airway is unremarkable.   Upper Abdomen: Liver margin is irregular. Visualized portions of the liver and adrenal glands are otherwise grossly unremarkable. Low-attenuation lesion in the right kidney. No specific follow-up necessary. Visualized portions of the kidneys, spleen, pancreas, stomach and bowel are otherwise grossly unremarkable. No upper abdominal adenopathy.   Musculoskeletal: Degenerative changes in the spine. Old T12 compression fracture.   IMPRESSION: 1. No suspicious pulmonary nodules. 2. Suspect upper and midlung zone predominant fibrotic interstitial lung disease, possibly due to nonspecific interstitial pneumonitis. If further evaluation is desired, future evaluation with high-resolution chest CT without contrast is suggested. 3. Cirrhosis. 4. Aortic atherosclerosis (ICD10-I70.0). Coronary artery calcification. 5. Enlarged pulmonic trunk, indicative of pulmonary arterial hypertension.     Electronically Signed   By: Anna [name] M.D.   On: 02/26/2023 11:17      Latest Reference Range & Units 08/17/22 14:40 09/04/22 14:57 09/13/22 15:44 09/25/22 14:48  B Natriuretic Peptide 0.0 - 100.0 pg/mL 697.2 (H) 969.5 (H)    NT-Pro BNP 0 - 738 pg/mL   2,255 (H) 1,190 (H)  (H): Data is abnormally high  Latest Reference Range & Units 12/24/13 12:34 03/26/14 16:30 12/16/14 13:52 06/22/15 15:38 06/19/16 15:03 12/18/16 14:52 09/16/17 16:13 02/04/18 11:33 07/10/18 15:49 05/10/19 10:00 04/12/20 13:59 07/07/20 14:21 03/19/21 14:09 11/30/21 14:37 03/28/22 16:13 08/17/22 10:28 08/19/22 00:43 08/20/22 01:32 08/21/22 02:27 08/22/22 00:52 09/04/22 14:57 01/31/23 14:47  Hemoglobin 11.1 - 15.9 g/dL 91.4 (L) 78.2 (L) 95.6 (L) 11.5 (L) 11.8 11.2 12.1 11.1 10.9 (L) 10.4 (L) 11.4 11.1 12.3 10.8 (L) 11.6 10.5 (L) 9.7 (L) 9.2 (L) 9.8  (L) 10.3 (L) 10.9 (L) 11.4  (L): Data is abnormally low  has a past medical history of Hyperlipidemia, Hypertension, Mitral regurgitation, Mitral stenosis, Osteoarthritis, Overweight(278.02), and Permanent atrial fibrillation (HCC).   reports that she has never smoked. She has never used smokeless tobacco.  Past Surgical History:  Procedure Laterality Date   CARDIOVERSION N/A 03/24/2014   Procedure: CARDIOVERSION;  Surgeon: Anna Reichert, MD;  Location: [facility];  Service: Cardiovascular;  Laterality: N/A;   LEFT HEART CATH AND CORONARY ANGIOGRAPHY N/A 08/20/2022   Procedure: LEFT HEART CATH AND CORONARY ANGIOGRAPHY;  Surgeon: Corky Crafts, MD;  Location: [facility] INVASIVE CV LAB;  Service: Cardiovascular;  Laterality: N/A;    No Known Allergies  Immunization History  Administered Date(s) Administered   Influenza, High Dose Seasonal PF 07/10/2018, 07/19/2019, 08/18/2022   Influenza,inj,Quad PF,6+ Mos 06/25/2016, 09/16/2017, 07/07/2020, 07/20/2021   Janssen (J&J) SARS-COV-2 Vaccination 02/07/2020   PFIZER(Purple Top)SARS-COV-2 Vaccination 03/15/2021   Pneumococcal Conjugate-13 09/16/2017   Pneumococcal Polysaccharide-23 03/28/2022   Tdap 03/17/2017    Family History  Problem Relation Age of Onset   Alzheimer's disease Mother      Current Outpatient Medications:    carvedilol (COREG) 3.125 MG tablet, Take 1 tablet (3.125 mg total) by mouth daily., Disp: 90 tablet, Rfl: 1   Cholecalciferol (VITAMIN D3) 10 MCG (400 UNIT) tablet, Take 2 tablets (800 Units total) by mouth daily., Disp: 120  tablet, Rfl: 2   cyclobenzaprine (FLEXERIL) 10 MG tablet, Take 0.5 tablets (5 mg total) by mouth 2 (two) times daily as needed for muscle spasms., Disp: 20 tablet, Rfl: 0   dapagliflozin propanediol (FARXIGA) 10 MG TABS tablet, Take 1 tablet (10 mg total) by mouth daily., Disp: 90 tablet, Rfl: 3   diclofenac Sodium (VOLTAREN) 1 % GEL, Apply 2 grams to knees 4 times a day as needed., Disp: 400  g, Rfl: 1   docusate sodium (COLACE) 100 MG capsule, Take 100 mg by mouth daily. , Disp: , Rfl:    ELIQUIS 5 MG TABS tablet, TAKE 1 TABLET BY MOUTH TWICE A DAY, Disp: 60 tablet, Rfl: 11   ferrous sulfate 325 (65 FE) MG tablet, Take 1 tablet (325 mg total) by mouth daily with breakfast., Disp: 90 tablet, Rfl: 0   furosemide (LASIX) 20 MG tablet, Take 1 tablet (20 mg total) by mouth daily., Disp: 90 tablet, Rfl: 3   HYDROcodone-acetaminophen (NORCO/VICODIN) 5-325 MG tablet, Take 1 tablet by mouth every 6 (six) hours as needed for moderate pain., Disp: , Rfl:    Misc. Devices MISC, Passenger transport manager, Disp: 1 Device, Rfl: 0   Misc. Devices MISC, Rollator walker, Disp: 1 Device, Rfl: 0   Misc. Devices MISC, Hospital bed, Disp: 1 Device, Rfl: 0   naloxone (NARCAN) nasal spray 4 mg/0.1 mL, Place 1 spray into the nose once., Disp: , Rfl:    Omega-3 Fatty Acids (FISH OIL CONCENTRATE PO), Take 1 capsule by mouth daily. , Disp: , Rfl:    rosuvastatin (CRESTOR) 10 MG tablet, Take 1 tablet (10 mg total) by mouth daily., Disp: 90 tablet, Rfl: 1   sacubitril-valsartan (ENTRESTO) 24-26 MG, Take 1 tablet by mouth 2 (two) times daily., Disp: 180 tablet, Rfl: 3   spironolactone (ALDACTONE) 25 MG tablet, Take 0.5 tablets (12.5 mg total) by mouth daily., Disp: 45 tablet, Rfl: 3   White Petrolatum-Mineral Oil (SYSTANE NIGHTTIME) OINT, Place 1 drop into the left eye 4 (four) times daily., Disp: , Rfl:       Objective:   Vitals:   04/10/23 1544  BP: (!) 120/58  Pulse: (!) 51  SpO2: 99%  Weight: 164 lb 6.4 oz (74.6 kg)  Height: 5\' 5"  (1.651 m)    Estimated body mass index is 27.36 kg/m as calculated from the following:   Height as of this encounter: 5\' 5"  (1.651 m).   Weight as of this encounter: 164 lb 6.4 oz (74.6 kg).  @WEIGHTCHANGE @  [employer]   04/10/23 1544  Weight: 164 lb 6.4 oz (74.6 kg)     Physical Exam   General: No distress.  Sitting in a wheelchair.  Family members by her side.   Looks frail O2 at rest: no Cane present: no Sitting in wheel chair: no Frail: YES Obese: no Neuro: Alert and Oriented x 3. GCS 15. Speech normal Psych: Pleasant Resp:  Barrel Chest - no.  Wheeze - no, Crackles - no, No overt respiratory distress CVS: Normal heart sounds. Murmurs - no Ext: Stigmata of Connective Tissue Disease - no HEENT: Normal upper airway. PEERL +. No post nasal drip        Assessment:       ICD-10-CM   1. History of pneumonia  Z87.01     2. Abnormal CT of the chest  R93.89          Plan:     Patient Instructions     ICD-10-CM   1.  History of pneumonia  Z87.01      You had pneumonia in October 2023.  On the latest scan April 2024 this pneumonia has completely resolved.  I am not so sure you have interstitial lung disease.  If at all you have extremely mild.  Based on the scan features the risk of progression is extremely low.  It appears your goals of care is for symptom based supportive care approach.  I fully support this  Plan - Discharged from follow-up.  Return as needed -You can consider stopping fish oil which tends to increase acid reflux risk -Make sure you get the RSV vaccine and you have staying up-to-date with all your vaccines. -Stay away from sick people and avoid human clusters    SIGNATURE    Dr. Kalman [name], M.D., F.C.C.P,  Pulmonary and Critical Care Medicine Staff Physician, [facility] Center Director - Interstitial Lung Disease  Program  Pulmonary Fibrosis [facility] [place], [place], [address]  Pager: [phone], If no answer or between  15:00h - 7:00h: call [phone] Telephone: [phone]  4:29 PM 04/10/2023

## 2023-04-11 ENCOUNTER — Other Ambulatory Visit: Payer: Self-pay | Admitting: Internal Medicine

## 2023-04-11 DIAGNOSIS — I10 Essential (primary) hypertension: Secondary | ICD-10-CM

## 2023-04-14 NOTE — Telephone Encounter (Signed)
Unable to refill per protocol, Rx expired. Discontinued 08/24/22.  Requested Prescriptions  Pending Prescriptions Disp Refills   amlodipine-olmesartan (AZOR) 10-20 MG tablet [Pharmacy Med Name: AMLODIPINE-OLMESARTAN 10-20 MG] 90 tablet 1    Sig: TAKE 1 TABLET BY MOUTH EVERY DAY     Cardiovascular: CCB + ARB Combos Failed - 04/11/2023  5:18 PM      Failed - Cr in normal range and within 180 days    Creat  Date Value Ref Range Status  06/19/2016 1.04 (H) 0.60 - 0.93 mg/dL Final    Comment:      For patients > or = 85 years of age: The upper reference limit for Creatinine is approximately 13% higher for people identified as African-American.      Creatinine, Ser  Date Value Ref Range Status  01/31/2023 1.27 (H) 0.57 - 1.00 mg/dL Final         Passed - K in normal range and within 180 days    Potassium  Date Value Ref Range Status  01/31/2023 4.8 3.5 - 5.2 mmol/L Final         Passed - Na in normal range and within 180 days    Sodium  Date Value Ref Range Status  01/31/2023 138 134 - 144 mmol/L Final         Passed - Patient is not pregnant      Passed - Last BP in normal range    BP Readings from Last 1 Encounters:  04/10/23 (!) 120/58         Passed - Valid encounter within last 6 months    Recent Outpatient Visits           2 months ago Systolic congestive heart failure, unspecified HF chronicity (HCC)   [place] [facility] Marcine Matar, MD   6 months ago Hospital discharge follow-up   [facility] Marcine Matar, MD   1 year ago Essential hypertension   [facility] Marcine Matar, MD   1 year ago Essential hypertension   [place] [facility] Marcine Matar, MD   1 year ago Essential hypertension   [place] [facility] Marcine Matar, MD       Future Appointments             In 1 month  Laural Benes, Binnie Rail, MD [facility]   In 2 months Turner, Cornelious Bryant, MD [facility]

## 2023-04-25 ENCOUNTER — Other Ambulatory Visit: Payer: Self-pay | Admitting: Internal Medicine

## 2023-04-25 DIAGNOSIS — I10 Essential (primary) hypertension: Secondary | ICD-10-CM

## 2023-05-12 ENCOUNTER — Other Ambulatory Visit: Payer: Self-pay | Admitting: Internal Medicine

## 2023-05-12 DIAGNOSIS — I10 Essential (primary) hypertension: Secondary | ICD-10-CM

## 2023-05-12 DIAGNOSIS — I5020 Unspecified systolic (congestive) heart failure: Secondary | ICD-10-CM

## 2023-05-13 NOTE — Telephone Encounter (Signed)
Requested by interface surescripts. Medication discontinued 08/24/22.  Requested Prescriptions  Pending Prescriptions Disp Refills   rosuvastatin (CRESTOR) 10 MG tablet [Pharmacy Med Name: ROSUVASTATIN CALCIUM 10 MG TAB] 48 tablet 1    Sig: TAKE 1 TABLET BY MOUTH EVERY MONDAY, WEDNESDAY, FRIDAY, AND SATURDAY     Cardiovascular:  Antilipid - Statins 2 Failed - 05/12/2023  2:11 PM      Failed - Cr in normal range and within 360 days    Creat  Date Value Ref Range Status  06/19/2016 1.04 (H) 0.60 - 0.93 mg/dL Final    Comment:      For patients > or = 85 years of age: The upper reference limit for Creatinine is approximately 13% higher for people identified as African-American.      Creatinine, Ser  Date Value Ref Range Status  01/31/2023 1.27 (H) 0.57 - 1.00 mg/dL Final         Failed - Lipid Panel in normal range within the last 12 months    Cholesterol, Total  Date Value Ref Range Status  03/28/2022 106 100 - 199 mg/dL Final   Cholesterol  Date Value Ref Range Status  08/18/2022 69 0 - 200 mg/dL Final   LDL Chol Calc (NIH)  Date Value Ref Range Status  03/28/2022 44 0 - 99 mg/dL Final   LDL Cholesterol  Date Value Ref Range Status  08/18/2022 24 0 - 99 mg/dL Final    Comment:           Total Cholesterol/HDL:CHD Risk Coronary Heart Disease Risk Table                     Men   Women  1/2 Average Risk   3.4   3.3  Average Risk       5.0   4.4  2 X Average Risk   9.6   7.1  3 X Average Risk  23.4   11.0        Use the calculated Patient Ratio above and the CHD Risk Table to determine the patient's CHD Risk.        ATP III CLASSIFICATION (LDL):  <100     mg/dL   Optimal  782-956  mg/dL   Near or Above                    Optimal  130-159  mg/dL   Borderline  213-086  mg/dL   High  >578     mg/dL   Very High Performed at [facility], [address], [place], [place] [address]    HDL  Date Value Ref Range Status  08/18/2022 33 (L) >40 mg/dL Final   95/28/4132 49 >44 mg/dL Final   Triglycerides  Date Value Ref Range Status  08/18/2022 62 <150 mg/dL Final         Passed - Patient is not pregnant      Passed - Valid encounter within last 12 months    Recent Outpatient Visits           3 months ago Systolic congestive heart failure, unspecified HF chronicity (HCC)   [facility] Marcine Matar, MD   7 months ago Hospital discharge follow-up   [facility]ine Matar, MD   1 year ago Essential hypertension   [place] [facility] Marcine Matar, MD   1 year  ago Essential hypertension   [place] [facility] Marcine Matar, MD   1 year ago Essential hypertension   [place] [facility] Marcine Matar, MD       Future Appointments             In 2 weeks Marcine Matar, MD [facility]   In 1 month Mayford Knife, Cornelious Bryant, MD [facility], [facility]            Refused Prescriptions Disp Refills   amlodipine-olmesartan (AZOR) 10-20 MG tablet [Pharmacy Med Name: AMLODIPINE-OLMESARTAN 10-20 MG] 90 tablet 1    Sig: TAKE 1 TABLET BY MOUTH EVERY DAY     Cardiovascular: CCB + ARB Combos Failed - 05/12/2023  2:11 PM      Failed - Cr in normal range and within 180 days    Creat  Date Value Ref Range Status  06/19/2016 1.04 (H) 0.60 - 0.93 mg/dL Final    Comment:      For patients > or = 85 years of age: The upper reference limit for Creatinine is approximately 13% higher for people identified as African-American.      Creatinine, Ser  Date Value Ref Range Status  01/31/2023 1.27 (H) 0.57 - 1.00 mg/dL Final         Passed - K in normal range and within 180 days    Potassium  Date Value Ref Range Status  01/31/2023 4.8 3.5 - 5.2 mmol/L Final         Passed - Na in normal range and within  180 days    Sodium  Date Value Ref Range Status  01/31/2023 138 134 - 144 mmol/L Final         Passed - Patient is not pregnant      Passed - Last BP in normal range    BP Readings from Last 1 Encounters:  04/10/23 (!) 120/58         Passed - Valid encounter within last 6 months    Recent Outpatient Visits           3 months ago Systolic congestive heart failure, unspecified HF chronicity (HCC)   [place] [facility] Marcine Matar, MD   7 months ago Hospital discharge follow-up   [facility] Marcine Matar, MD   1 year ago Essential hypertension   [place] [facility] Marcine Matar, MD   1 year ago Essential hypertension   [place] Marcine Matar, MD   1 year ago Essential hypertension    [facility] Marcine Matar, MD       Future Appointments             In 2 weeks Marcine Matar, MD [facility]   In 1 month Turner, Cornelious Bryant, MD [facility]

## 2023-05-13 NOTE — Telephone Encounter (Signed)
Requested medication (s) are due for refill today: yes  Requested medication (s) are on the active medication list: yes  Last refill:  01/31/23 #90 1 refills   Future visit scheduled: yes in 2 weeks   Notes to clinic:  protocol failed. Last labs 03/28/22. Do you want to refill Rx?     Requested Prescriptions  Pending Prescriptions Disp Refills   rosuvastatin (CRESTOR) 10 MG tablet [Pharmacy Med Name: ROSUVASTATIN CALCIUM 10 MG TAB] 48 tablet 1    Sig: TAKE 1 TABLET BY MOUTH EVERY MONDAY, WEDNESDAY, FRIDAY, AND SATURDAY     Cardiovascular:  Antilipid - Statins 2 Failed - 05/12/2023  2:11 PM      Failed - Cr in normal range and within 360 days    Creat  Date Value Ref Range Status  06/19/2016 1.04 (H) 0.60 - 0.93 mg/dL Final    Comment:      For patients > or = 85 years of age: The upper reference limit for Creatinine is approximately 13% higher for people identified as African-American.      Creatinine, Ser  Date Value Ref Range Status  01/31/2023 1.27 (H) 0.57 - 1.00 mg/dL Final         Failed - Lipid Panel in normal range within the last 12 months    Cholesterol, Total  Date Value Ref Range Status  03/28/2022 106 100 - 199 mg/dL Final   Cholesterol  Date Value Ref Range Status  08/18/2022 69 0 - 200 mg/dL Final   LDL Chol Calc (NIH)  Date Value Ref Range Status  03/28/2022 44 0 - 99 mg/dL Final   LDL Cholesterol  Date Value Ref Range Status  08/18/2022 24 0 - 99 mg/dL Final    Comment:           Total Cholesterol/HDL:CHD Risk Coronary Heart Disease Risk Table                     Men   Women  1/2 Average Risk   3.4   3.3  Average Risk       5.0   4.4  2 X Average Risk   9.6   7.1  3 X Average Risk  23.4   11.0        Use the calculated Patient Ratio above and the CHD Risk Table to determine the patient's CHD Risk.        ATP III CLASSIFICATION (LDL):  <100     mg/dL   Optimal  161-096  mg/dL   Near or Above                    Optimal  130-159  mg/dL    Borderline  045-409  mg/dL   High  >811     mg/dL   Very High Performed at [facility], [address] [place], [place] [address]    HDL  Date Value Ref Range Status  08/18/2022 33 (L) >40 mg/dL Final  29/56/2130 49 >86 mg/dL Final   Triglycerides  Date Value Ref Range Status  08/18/2022 62 <150 mg/dL Final         Passed - Patient is not pregnant      Passed - Valid encounter within last 12 months    Recent Outpatient Visits           3 months ago Systolic congestive heart failure, unspecified HF chronicity (HCC)   [facility] Marcine Matar, MD   7 months ago Hospital discharge follow-up   [facility] Marcine Matar, MD   1 year ago Essential hypertension   [facility] Marcine Matar, MD   1 year ago Essential hypertension   [facility] Marcine Matar, MD   1 year ago Essential hypertension   [facility] Marcine Matar, MD       Future Appointments             In 2 weeks Marcine Matar, MD [facility]   In 1 month Mayford Knife, Cornelious Bryant, MD [facility]            Refused Prescriptions Disp Refills   amlodipine-olmesartan (AZOR) 10-20 MG tablet [Pharmacy Med Name: AMLODIPINE-OLMESARTAN 10-20 MG] 90 tablet 1    Sig: TAKE 1 TABLET BY MOUTH EVERY DAY     Cardiovascular: CCB + ARB Combos Failed - 05/12/2023  2:11 PM      Failed - Cr in normal range and within 180 days    Creat  Date Value Ref Range Status  06/19/2016 1.04 (H) 0.60 - 0.93 mg/dL Final    Comment:      For patients > or = 85 years of age: The upper reference limit for Creatinine is approximately 13% higher for people identified as African-American.      Creatinine, Ser  Date Value Ref Range Status  01/31/2023 1.27 (H)  0.57 - 1.00 mg/dL Final         Passed - K in normal range and within 180 days    Potassium  Date Value Ref Range Status  01/31/2023 4.8 3.5 - 5.2 mmol/L Final         Passed - Na in normal range and within 180 days    Sodium  Date Value Ref Range Status  01/31/2023 138 134 - 144 mmol/L Final         Passed - Patient is not pregnant      Passed - Last BP in normal range    BP Readings from Last 1 Encounters:  04/10/23 (!) 120/58         Passed - Valid encounter within last 6 months    Recent Outpatient Visits           3 months ago Systolic congestive heart failure, unspecified HF chronicity (HCC)   [facility] Marcine Matar, MD   7 months ago Hospital discharge follow-up   [facility] Marcine Matar, MD   1 year ago Essential hypertension   [facility] Marcine Matar, MD   1 year ago Essential hypertension   [facility] Marcine Matar, MD   1 year ago Essential hypertension   [facility] Marcine Matar, MD       Future Appointments             In 2 weeks Marcine Matar, MD [facility]   In 1 month Turner, Cornelious Bryant, MD [facility]

## 2023-06-02 ENCOUNTER — Encounter: Payer: Self-pay | Admitting: Internal Medicine

## 2023-06-02 ENCOUNTER — Ambulatory Visit: Payer: 59 | Attending: Internal Medicine | Admitting: Internal Medicine

## 2023-06-02 VITALS — BP 134/75 | HR 51 | Temp 97.7°F | Ht 65.0 in | Wt 163.0 lb

## 2023-06-02 DIAGNOSIS — Z7984 Long term (current) use of oral hypoglycemic drugs: Secondary | ICD-10-CM

## 2023-06-02 DIAGNOSIS — D638 Anemia in other chronic diseases classified elsewhere: Secondary | ICD-10-CM

## 2023-06-02 DIAGNOSIS — E119 Type 2 diabetes mellitus without complications: Secondary | ICD-10-CM | POA: Diagnosis not present

## 2023-06-02 DIAGNOSIS — I152 Hypertension secondary to endocrine disorders: Secondary | ICD-10-CM

## 2023-06-02 DIAGNOSIS — N1832 Chronic kidney disease, stage 3b: Secondary | ICD-10-CM

## 2023-06-02 DIAGNOSIS — M170 Bilateral primary osteoarthritis of knee: Secondary | ICD-10-CM | POA: Diagnosis not present

## 2023-06-02 DIAGNOSIS — I5020 Unspecified systolic (congestive) heart failure: Secondary | ICD-10-CM | POA: Diagnosis not present

## 2023-06-02 DIAGNOSIS — E1159 Type 2 diabetes mellitus with other circulatory complications: Secondary | ICD-10-CM | POA: Diagnosis not present

## 2023-06-02 LAB — POCT GLYCOSYLATED HEMOGLOBIN (HGB A1C): HbA1c, POC (controlled diabetic range): 5.9 % (ref 0.0–7.0)

## 2023-06-02 LAB — GLUCOSE, POCT (MANUAL RESULT ENTRY): POC Glucose: 130 mg/dl — AB (ref 70–99)

## 2023-06-02 MED ORDER — DICLOFENAC SODIUM 1 % EX GEL: CUTANEOUS | 3 refills | Status: DC

## 2023-06-02 MED ORDER — ROSUVASTATIN CALCIUM 10 MG PO TABS: ORAL_TABLET | ORAL | 1 refills | Status: DC

## 2023-06-02 NOTE — Progress Notes (Signed)
Patient ID: Anna [name], female    DOB: 01-May-1936  MRN: [id number]  CC: Hypertension (HTN f/u. Med refills. /)   Subjective: Anna [name] is a 87 y.o. female who presents for chronic ds management Her concerns today include:  history of atrial flutter on Eliquis with history of cardioversion 2015, systolic CHF/stress-induced cardiomyopathy HTN, trivial AR, mild MR,OA knees, HL, prediabetes, CKD 3, ACD, lumbar radiculopathy, hx of vertebral fx receiving pain management through Dr[facility] Joselyn Glassman at [facility]    Patient is alone today.  She tells me that her granddaughter [name] could not make it.  [name]'s daughter brought her to the visit.  She had seen Dr. Marchelle Gearing since last visit with me for changes on CT scan suggestive of ILD.  He told her that he is not sure that she has interstitial lung disease and based on the scan features the risk of progression is extremely low.  Advised to return as needed.  HTN/atrial flutter/systolic CHF:   Does not have her medicines with her today but states she is taking everything.  [name] sets up her medicines.  She is on Eliquis, carvedilol 3.125 mg twice a day, Entresto 25/26 mg twice a day, spironolactone 12.5 mg daily, furosemide 20 mg daily, Farxiga 10 mg daily and Crestor 10 mg 4 times a week No shortness of breath, chest pain or lower extremity edema.  No PND orthopnea.  DM: A1c 5.9/blood sugar 130  On last visit A1c was 6.8 which places her in the range for diabetes.  She is on Farxiga 10 mg daily.  Checks blood sugars but infrequently.  She feels she does okay with her eating habits.  Chronic anemia: CBC on last visit showed hemoglobin had improved from 10.9-11.4.  She denies any dizziness.  CKD 3: Followed by nephrology.  Last seen 02/2023.  Not on oral NSAIDs.  OA knees:  Denies any falls.  Reports good appetite.  Sleeping well.  HM: She reports having had both Shingrix vaccines already.  Plans to get the flu shot this  fall. Patient Active Problem List   Diagnosis Date Noted   Elevated troponin    Acute heart failure (HCC)    NSTEMI (non-ST elevated myocardial infarction) (HCC) 08/17/2022   Mitral stenosis 05/07/2021   Recurrent falls 04/24/2021   History of vertebral fracture 04/24/2021   Lumbar radiculopathy 04/24/2021   Anemia due to stage 3a chronic kidney disease (HCC) 03/07/2020   Stage 3a chronic kidney disease (HCC) 03/07/2020   Mixed incontinence urge and stress 03/09/2018   Prediabetes 03/09/2018   OA (osteoarthritis) of knee 03/09/2018   Sinus bradycardia 12/18/2016   Estrogen deficiency 07/03/2016   Mitral regurgitation 03/01/2014   Chronic atrial flutter (HCC) 03/01/2014   Permanent atrial fibrillation (HCC) 12/24/2013   Essential hypertension, benign 12/24/2013   Heart murmur 12/24/2013   Hyperlipemia 12/24/2013     Current Outpatient Medications on File Prior to Visit  Medication Sig Dispense Refill   carvedilol (COREG) 3.125 MG tablet Take 1 tablet (3.125 mg total) by mouth daily. 90 tablet 1   Cholecalciferol (VITAMIN D3) 10 MCG (400 UNIT) tablet Take 2 tablets (800 Units total) by mouth daily. 120 tablet 2   cyclobenzaprine (FLEXERIL) 10 MG tablet Take 0.5 tablets (5 mg total) by mouth 2 (two) times daily as needed for muscle spasms. 20 tablet 0   dapagliflozin propanediol (FARXIGA) 10 MG TABS tablet Take 1 tablet (10 mg total) by mouth daily. 90 tablet 3   docusate  sodium (COLACE) 100 MG capsule Take 100 mg by mouth daily.      ELIQUIS 5 MG TABS tablet TAKE 1 TABLET BY MOUTH TWICE A DAY 60 tablet 11   ferrous sulfate 325 (65 FE) MG tablet Take 1 tablet (325 mg total) by mouth daily with breakfast. 90 tablet 0   furosemide (LASIX) 20 MG tablet Take 1 tablet (20 mg total) by mouth daily. 90 tablet 3   HYDROcodone-acetaminophen (NORCO/VICODIN) 5-325 MG tablet Take 1 tablet by mouth every 6 (six) hours as needed for moderate pain.     Misc. Devices Programmer, multimedia. Devices MISC Rollator walker 1 Device 0   Misc. Devices MISC Hospital bed 1 Device 0   naloxone [facility]) nasal spray 4 mg/0.1 mL Place 1 spray into the nose once.     Omega-3 Fatty Acids (FISH OIL CONCENTRATE PO) Take 1 capsule by mouth daily.      sacubitril-valsartan (ENTRESTO) 24-26 MG Take 1 tablet by mouth 2 (two) times daily. 180 tablet 3   spironolactone (ALDACTONE) 25 MG tablet Take 0.5 tablets (12.5 mg total) by mouth daily. 45 tablet 3   White Petrolatum-Mineral Oil (SYSTANE NIGHTTIME) OINT Place 1 drop into the left eye 4 (four) times daily.     No current facility-administered medications on file prior to visit.    No Known Allergies  Social History   Socioeconomic History   Marital status: Divorced    Spouse name: Not on file   Number of children: 3   Years of education: Not on file   Highest education level: Not on file  Occupational History   Occupation: retired  Tobacco Use   Smoking status: Never   Smokeless tobacco: Never  Vaping Use   Vaping status: Never Used  Substance and Sexual Activity   Alcohol use: No   Drug use: No   Sexual activity: Not on file  Other Topics Concern   Not on file  Social History Narrative   Not on file   Social Determinants of Health   Financial Resource Strain: Low Risk  (02/21/2023)   Overall Financial Resource Strain (CARDIA)    Difficulty of Paying Living Expenses: Not hard at all  Food Insecurity: No Food Insecurity (02/21/2023)   Hunger Vital Sign    Worried About Running Out of Food in the Last Year: Never true    Ran Out of Food in the Last Year: Never true  Transportation Needs: No Transportation Needs (02/21/2023)   PRAPARE - Administrator, Civil Service (Medical): No    Lack of Transportation (Non-Medical): No  Physical Activity: Inactive (02/21/2023)   Exercise Vital Sign    Days of Exercise per Week: 0 days    Minutes of Exercise per Session: 0 min  Stress: No Stress Concern  Present (02/21/2023)   [employer] of Occupational Health - Occupational Stress Questionnaire    Feeling of Stress : Not at all  Social Connections: Not on file  Intimate Partner Violence: Not At Risk (02/21/2023)   Humiliation, Afraid, Rape, and Kick questionnaire    Fear of Current or Ex-Partner: No    Emotionally Abused: No    Physically Abused: No    Sexually Abused: No    Family History  Problem Relation Age of Onset   Alzheimer's disease Mother     Past Surgical History:  Procedure Laterality Date   CARDIOVERSION N/A 03/24/2014   Procedure: CARDIOVERSION;  Surgeon: Cornelious Bryant  Mayford Knife, MD;  Location: [facility];  Service: Cardiovascular;  Laterality: N/A;   LEFT HEART CATH AND CORONARY ANGIOGRAPHY N/A 08/20/2022   Procedure: LEFT HEART CATH AND CORONARY ANGIOGRAPHY;  Surgeon: Corky Crafts, MD;  Location: [facility];  Service: Cardiovascular;  Laterality: N/A;    ROS: Review of Systems Negative except as stated above  PHYSICAL EXAM: BP 134/75   Pulse (!) 51   Temp 97.7 F (36.5 C) (Oral)   Ht 5\' 5"  (1.651 m)   Wt 163 lb (73.9 kg)   SpO2 100%   BMI 27.12 kg/m   Wt Readings from Last 3 Encounters:  06/02/23 163 lb (73.9 kg)  04/10/23 164 lb 6.4 oz (74.6 kg)  01/31/23 163 lb (73.9 kg)    Physical Exam  General appearance - alert, well appearing, pleasant elderly female sitting in wheelchair and in no distress Mental status - normal mood, behavior, speech, dress, motor activity, and thought processes Neck - supple, no significant adenopathy Chest - clear to auscultation, no wheezes, rales or rhonchi, symmetric air entry Heart -heart sounds irregularly irregular but rate controlled. Extremities -no lower extremity edema.      Latest Ref Rng & Units 01/31/2023    2:47 PM 09/25/2022    2:48 PM 09/13/2022    3:44 PM  CMP  Glucose 70 - 99 mg/dL 191  478  81   BUN 8 - 27 mg/dL 34  29  15   Creatinine 0.57 - 1.00 mg/dL 2.95  6.21  3.08   Sodium  134 - 144 mmol/L 138  139  138   Potassium 3.5 - 5.2 mmol/L 4.8  4.3  4.3   Chloride 96 - 106 mmol/L 99  96  100   CO2 20 - 29 mmol/L 23  27  24    Calcium 8.7 - 10.3 mg/dL 9.9  65.7  9.8   Total Protein 6.0 - 8.5 g/dL 6.8     Total Bilirubin 0.0 - 1.2 mg/dL 0.4     Alkaline Phos 44 - 121 IU/L 81     AST 0 - 40 IU/L 17     ALT 0 - 32 IU/L 7      Lipid Panel     Component Value Date/Time   CHOL 69 08/18/2022 0620   CHOL 106 03/28/2022 1613   TRIG 62 08/18/2022 0620   HDL 33 (L) 08/18/2022 0620   HDL 49 03/28/2022 1613   CHOLHDL 2.1 08/18/2022 0620   VLDL 12 08/18/2022 0620   LDLCALC 24 08/18/2022 0620   LDLCALC 44 03/28/2022 1613    CBC    Component Value Date/Time   WBC 5.5 01/31/2023 1447   WBC 4.6 09/04/2022 1457   RBC 4.00 01/31/2023 1447   RBC 3.85 (L) 09/04/2022 1457   HGB 11.4 01/31/2023 1447   HCT 34.6 01/31/2023 1447   PLT 240 01/31/2023 1447   MCV 87 01/31/2023 1447   MCH 28.5 01/31/2023 1447   MCH 28.3 09/04/2022 1457   MCHC 32.9 01/31/2023 1447   MCHC 31.9 09/04/2022 1457   RDW 12.6 01/31/2023 1447   LYMPHSABS 0.7 08/21/2022 0227   LYMPHSABS 1.9 12/18/2016 1452   MONOABS 0.5 08/21/2022 0227   EOSABS 0.0 08/21/2022 0227   EOSABS 0.2 12/18/2016 1452   BASOSABS 0.0 08/21/2022 0227   BASOSABS 0.1 12/18/2016 1452    ASSESSMENT AND PLAN: 1. Diabetes mellitus treated with oral medication (HCC) At goal.  Continue Farxiga 10 mg daily.  Continue healthy eating  habits. - POCT glycosylated hemoglobin (Hb A1C) - POCT glucose (manual entry)  2. Hypertension associated with type 2 diabetes mellitus (HCC) 3. Systolic congestive heart failure, unspecified HF chronicity (HCC) Blood pressure is close to goal. CHF is compensated.  Continue  carvedilol 3.125 mg twice a day, Entresto 25/26 mg twice a day, spironolactone 12.5 mg daily, furosemide 20 mg daily, Farxiga 10 mg daily and Crestor 10 mg 4 times a week  - rosuvastatin (CRESTOR) 10 MG tablet; TAKE 1 TABLET BY  MOUTH EVERY MONDAY, WEDNESDAY, FRIDAY, AND SATURDAYStrength: 10 mg  Dispense: 36 tablet; Refill: 1  4. Primary osteoarthritis of both knees - diclofenac Sodium (VOLTAREN) 1 % GEL; Apply 2 grams to knees 4 times a day as needed.  Dispense: 400 g; Refill: 3  5. Stage 3b chronic kidney disease (HCC) Stable.  Continue to monitor.  6. Anemia, chronic disease Stable.  Continue iron supplement.    Patient was given the opportunity to ask questions.  Patient verbalized understanding of the plan and was able to repeat key elements of the plan.   This documentation was completed using Paediatric nurse.  Any transcriptional errors are unintentional.  Orders Placed This Encounter  Procedures   POCT glycosylated hemoglobin (Hb A1C)   POCT glucose (manual entry)     Requested Prescriptions   Signed Prescriptions Disp Refills   diclofenac Sodium (VOLTAREN) 1 % GEL 400 g 3    Sig: Apply 2 grams to knees 4 times a day as needed.   rosuvastatin (CRESTOR) 10 MG tablet 36 tablet 1    Sig: TAKE 1 TABLET BY MOUTH EVERY MONDAY, WEDNESDAY, FRIDAY, AND SATURDAYStrength: 10 mg    Return in about 4 months (around 10/02/2023).  Jonah Blue, MD, FACP

## 2023-06-03 ENCOUNTER — Telehealth: Payer: Self-pay | Admitting: Internal Medicine

## 2023-06-03 NOTE — Telephone Encounter (Signed)
Pt was supposed to mention getting sanitary pads from [facility] Phone  [phone]  Pt needs an order from Dr Laural Benes  Daughter called to follow up.

## 2023-06-04 NOTE — Telephone Encounter (Signed)
Spoke with patient case Water quality scientist. Clarice advised that there is no order needed and she would handle patient getting urinary pads.

## 2023-06-13 ENCOUNTER — Telehealth: Payer: Self-pay | Admitting: Internal Medicine

## 2023-06-13 NOTE — Telephone Encounter (Signed)
Copied from CRM [id number]. Topic: General - Other >> Jun 13, 2023  4:31 PM Epimenio Foot F wrote: Reason for CRM: Pt's granddaughter is calling in because she spoke with [employer] and was told they have been sending requests regarding her shipment being sent out and they said they haven't heard anything back from Dr. Laural Benes. [name], pt's niece says they need this done ASAP because the order is supposed to ship out on 06/16/23. Please follow up with [name].

## 2023-06-16 NOTE — Telephone Encounter (Signed)
Called & spoke to [place]. Anna [name] stated that she is requesting incontinence supplies (leak pads / pull-ups). Please advise.

## 2023-06-17 ENCOUNTER — Ambulatory Visit: Payer: 59 | Admitting: Physician Assistant

## 2023-06-17 ENCOUNTER — Ambulatory Visit: Payer: 59 | Admitting: Cardiology

## 2023-06-20 ENCOUNTER — Other Ambulatory Visit: Payer: Self-pay | Admitting: Internal Medicine

## 2023-06-20 ENCOUNTER — Other Ambulatory Visit: Payer: Self-pay | Admitting: Cardiology

## 2023-06-20 DIAGNOSIS — I10 Essential (primary) hypertension: Secondary | ICD-10-CM

## 2023-06-20 NOTE — Telephone Encounter (Signed)
Unable to locate any forms. Will call [employer] to request forms be re-faxed.

## 2023-06-25 NOTE — Telephone Encounter (Signed)
Called [employer] and spoke with [place]. Albin Felling confirmed that forms will be re-faxed on 06/25/2023.

## 2023-07-07 ENCOUNTER — Telehealth: Payer: Self-pay

## 2023-07-07 NOTE — Telephone Encounter (Signed)
Copied from CRM [id number]. Topic: General - Inquiry >> Jul 07, 2023  9:33 AM Marlow Baars wrote: Reason for CRM: Jomarie Longs with [employer] is re faxing forms for incontinence supplies for the patient. Please assist further

## 2023-07-09 ENCOUNTER — Ambulatory Visit: Payer: 59 | Attending: Cardiology | Admitting: Physician Assistant

## 2023-07-09 ENCOUNTER — Encounter: Payer: Self-pay | Admitting: Physician Assistant

## 2023-07-09 VITALS — BP 130/70 | HR 64 | Ht 64.0 in | Wt 154.0 lb

## 2023-07-09 DIAGNOSIS — I4821 Permanent atrial fibrillation: Secondary | ICD-10-CM | POA: Diagnosis not present

## 2023-07-09 DIAGNOSIS — I5181 Takotsubo syndrome: Secondary | ICD-10-CM | POA: Diagnosis not present

## 2023-07-09 DIAGNOSIS — I10 Essential (primary) hypertension: Secondary | ICD-10-CM | POA: Diagnosis not present

## 2023-07-09 DIAGNOSIS — I340 Nonrheumatic mitral (valve) insufficiency: Secondary | ICD-10-CM

## 2023-07-09 DIAGNOSIS — E7800 Pure hypercholesterolemia, unspecified: Secondary | ICD-10-CM

## 2023-07-09 NOTE — Progress Notes (Signed)
Cardiology Office Note:    Date:  07/09/2023  ID:  Anna [name], DOB 1936-05-23, MRN [id number] PCP: Anna Matar, MD  [facility] Providers Cardiologist:  Anna Magic, MD       Patient Profile:      Anna [name] Admitted 07/2022 with hypoxic respiratory failure in the setting of right lower lobe pneumonia, acute HFrEF and non-STEMI>> EF 25, mild nonobstructive CAD on cath [facility] 08/20/2022: LAD luminal irregularities; LVEDP 20-suspect Anna [name] TTE 08/18/2022: EF 25, mid and distal anterior septum and entire apical AK TTE 10/07/2022: EF 55-60, no RWMA, low normal RVSF, severe LAE, mild-moderate RAE, mild MR, mild-moderate MS, mean MV gradient 3, severe MAC, mild AV calcification Permanent atrial fibrillation Mitral regurgitation, mitral stenosis Hypertension Hyperlipidemia          History of Present Illness:  Discussed the use of AI scribe software for clinical note transcription with the patient, who gave verbal consent to proceed.    Anna [name] is a 87 y.o. female who returns for returns for follow up of AFib, [name], mitral valve disease.  She was last seen by Anna Favre, PA-C in February 2024.  She is here with her granddaughter.  She is wheelchair-bound but can ambulate short distances at home with a walker. This is due to knee problems and arthritis. She sleeps on her side with one pillow and denies orthopnea. She has intermittent leg swelling, which is well-controlled with Lasix taken twice weekly as needed.  She has not had significant shortness of breath, chest pain, syncope, orthopnea.     ROS: See HPI.  She has not had hematuria, melena, hematochezia.    Studies Reviewed:        Risk Assessment/Calculations:    CHA2DS2-VASc Score = 5   This indicates a 7.2% annual risk of stroke. The patient's score is based upon: CHF History: 1 HTN History: 1 Diabetes History: 0 Stroke History: 0 Vascular Disease History:  0 Age Score: 2 Gender Score: 1            Physical Exam:   VS:  BP 130/70   Pulse 64   Ht 5\' 4"  (1.626 m)   Wt 154 lb (69.9 kg)   SpO2 96%   BMI 26.43 kg/m    Wt Readings from Last 3 Encounters:  07/09/23 154 lb (69.9 kg)  06/02/23 163 lb (73.9 kg)  04/10/23 164 lb 6.4 oz (74.6 kg)    Constitutional:      Appearance: Healthy appearance. Not in distress.  Pulmonary:     Breath sounds: Normal breath sounds. No wheezing. No rales.  Cardiovascular:     Normal rate. Irregularly irregular rhythm.     Murmurs: There is no murmur.  Edema:    Peripheral edema absent.  Musculoskeletal:     Cervical back: Neck supple. Skin:    General: Skin is warm and dry.      Assessment and Plan:     Anna [name] History of non-ST elevation myocardial infarction in October 2023.  She had minimal nonobstructive CAD at that time.  EF was as low as 25% but returned to normal.  Echo in December 2023 with EF 55-60.  Overall, she is doing well.  She takes furosemide only twice weekly.  Overall volume status is controlled.  She is fairly sedentary secondary to arthritis and typically uses a wheelchair.  She sometimes uses a walker.  Functional status is difficult to assess.   -Continue Carvedilol 3.125mg  twice daily,  Farxiga 10mg  daily, Entresto 24/26mg  twice daily, Spironolactone 12.5mg  daily. -Follow-up 6 months  Hyperlipidemia LDL optimal at 24 in October 2023. -Continue Crestor 10mg  on Monday, Wednesday, Friday, and Saturday.  Permanent atrial fibrillation  Heart rate is controlled.  She is tolerating anticoagulation.  Hemoglobin stable in April 2024 at 11.4.  Creatinine stable at 1.2 02 February 2023.  Based upon weight and creatinine, she is on the appropriate dose of Eliquis.   -Continue Eliquis 5mg  twice daily, carvedilol 3.125 mg twice daily.  Hypertension Blood pressure controlled.   -Continue carvedilol 3.125 mg twice daily, Entresto 24/26 mg twice daily, spironolactone 12.5 mg  daily.  Mitral valve disease Mild-moderate regurgitation and mild to moderate mitral stenosis by echocardiogram December 2023.  She has follow-up echocardiogram scheduled for December 2024.        Dispo:  Return in about 6 months (around 01/06/2024) for Routine Follow Up with Dr. Mayford Knife.  Signed, Anna Newcomer, PA-C

## 2023-07-09 NOTE — Patient Instructions (Signed)
Medication Instructions:  Your physician recommends that you continue on your current medications as directed. Please refer to the Current Medication list given to you today. *If you need a refill on your cardiac medications before your next appointment, please call your pharmacy*   Lab Work: None ordered If you have labs (blood work) drawn today and your tests are completely normal, you will receive your results only by: [place] (if you have MyChart) OR A paper copy in the mail If you have any lab test that is abnormal or we need to change your treatment, we will call you to review the results.   Testing/Procedures: None ordered   Follow-Up: At [facility], you and your health needs are our priority.  As part of our continuing mission to provide you with exceptional heart care, we have created designated Provider Care Teams.  These Care Teams include your primary Cardiologist (physician) and Advanced Practice Providers (APPs -  Physician Assistants and Nurse Practitioners) who all work together to provide you with the care you need, when you need it.  We recommend signing up for the patient portal called "MyChart".  Sign up information is provided on this After Visit Summary.  MyChart is used to connect with patients for Virtual Visits (Telemedicine).  Patients are able to view lab/test results, encounter notes, upcoming appointments, etc.  Non-urgent messages can be sent to your provider as well.   To learn more about what you can do with MyChart, go to [email or web address].    Your next appointment:   6 month(s)  Provider:   Fransico Him, MD    Other Instructions

## 2023-07-09 NOTE — Telephone Encounter (Signed)
Incontinence forms received and placed in provider box for signature.

## 2023-07-11 NOTE — Telephone Encounter (Signed)
Incontinence forms successfully faxed to [employer] on 07/11/2023.

## 2023-07-18 ENCOUNTER — Telehealth: Payer: Self-pay

## 2023-07-18 ENCOUNTER — Other Ambulatory Visit: Payer: Self-pay | Admitting: Internal Medicine

## 2023-07-18 DIAGNOSIS — I10 Essential (primary) hypertension: Secondary | ICD-10-CM

## 2023-07-18 NOTE — Telephone Encounter (Signed)
Called & spoke to [facility]  (authorized to receive information per DPR on file). [name] confirmed that order originally sent through [employer] was left inactivated due to [place] informing [employer] that the company preference is [employer] [facility]. [employer] [facility] incontinence forms received and placed in provider box for signature. [name] aware. No further questions at this time.

## 2023-07-18 NOTE — Telephone Encounter (Signed)
Copied from CRM [id number]. Topic: General - Inquiry >> Jul 18, 2023  2:39 PM De Blanch wrote: Reason for CRM: Gavin Pound from [employer] [facility] is calling to f/u on multiple faxes sent to the office for Incontinence Supplies. Will be resending today. Please advise.

## 2023-07-23 NOTE — Telephone Encounter (Signed)
Incontinence supply order successfully completed and faxed to [facility] on 07/23/2023.

## 2023-08-22 ENCOUNTER — Other Ambulatory Visit: Payer: Self-pay | Admitting: Internal Medicine

## 2023-08-22 DIAGNOSIS — I5020 Unspecified systolic (congestive) heart failure: Secondary | ICD-10-CM

## 2023-09-23 ENCOUNTER — Other Ambulatory Visit: Payer: Self-pay

## 2023-09-23 ENCOUNTER — Other Ambulatory Visit: Payer: Self-pay | Admitting: Internal Medicine

## 2023-09-23 DIAGNOSIS — M170 Bilateral primary osteoarthritis of knee: Secondary | ICD-10-CM

## 2023-09-23 MED ORDER — DICLOFENAC SODIUM 1 % EX GEL
CUTANEOUS | 0 refills | Status: DC
  Filled 2023-09-23: qty 400, 50d supply, fill #0
  Filled 2023-09-26: qty 100, 10d supply, fill #0

## 2023-09-24 ENCOUNTER — Other Ambulatory Visit: Payer: Self-pay

## 2023-09-26 ENCOUNTER — Other Ambulatory Visit: Payer: Self-pay

## 2023-10-02 ENCOUNTER — Other Ambulatory Visit: Payer: Self-pay

## 2023-10-02 ENCOUNTER — Ambulatory Visit: Payer: 59 | Attending: Internal Medicine | Admitting: Internal Medicine

## 2023-10-02 ENCOUNTER — Encounter: Payer: Self-pay | Admitting: Internal Medicine

## 2023-10-02 VITALS — BP 158/75 | HR 47 | Temp 98.1°F | Ht 64.0 in | Wt 150.0 lb

## 2023-10-02 DIAGNOSIS — I4821 Permanent atrial fibrillation: Secondary | ICD-10-CM

## 2023-10-02 DIAGNOSIS — Z23 Encounter for immunization: Secondary | ICD-10-CM | POA: Diagnosis not present

## 2023-10-02 DIAGNOSIS — I5020 Unspecified systolic (congestive) heart failure: Secondary | ICD-10-CM

## 2023-10-02 DIAGNOSIS — Z7984 Long term (current) use of oral hypoglycemic drugs: Secondary | ICD-10-CM | POA: Diagnosis not present

## 2023-10-02 DIAGNOSIS — E119 Type 2 diabetes mellitus without complications: Secondary | ICD-10-CM | POA: Diagnosis not present

## 2023-10-02 DIAGNOSIS — E1159 Type 2 diabetes mellitus with other circulatory complications: Secondary | ICD-10-CM

## 2023-10-02 DIAGNOSIS — N1832 Chronic kidney disease, stage 3b: Secondary | ICD-10-CM

## 2023-10-02 DIAGNOSIS — M170 Bilateral primary osteoarthritis of knee: Secondary | ICD-10-CM | POA: Diagnosis not present

## 2023-10-02 DIAGNOSIS — I152 Hypertension secondary to endocrine disorders: Secondary | ICD-10-CM

## 2023-10-02 LAB — POCT GLYCOSYLATED HEMOGLOBIN (HGB A1C): HbA1c, POC (controlled diabetic range): 6.1 % (ref 0.0–7.0)

## 2023-10-02 LAB — GLUCOSE, POCT (MANUAL RESULT ENTRY): POC Glucose: 72 mg/dL (ref 70–99)

## 2023-10-02 MED ORDER — DICLOFENAC SODIUM 1 % EX GEL
CUTANEOUS | 2 refills | Status: DC
  Filled 2023-10-02: qty 400, fill #0
  Filled 2023-10-07: qty 200, 20d supply, fill #0
  Filled 2023-10-07: qty 20, 20d supply, fill #0
  Filled 2023-10-07: qty 100, 10d supply, fill #0
  Filled 2023-11-05: qty 200, 20d supply, fill #1
  Filled 2023-12-29: qty 200, 20d supply, fill #2
  Filled 2024-02-03: qty 200, 20d supply, fill #3
  Filled 2024-02-24: qty 200, 20d supply, fill #4

## 2023-10-02 NOTE — Progress Notes (Signed)
Patient ID: Anna [name], female    DOB: 1935/12/29  MRN: [id number]  CC: Diabetes (DM f/u. Franchot Erichsen Voltaren Valentino Hue flu vax. Instructed )   Subjective: Anna [name] is a 87 y.o. female who presents for chronic ds management.  [name], is with her.  Her concerns today include:  history of atrial flutter on Eliquis with history of cardioversion 2015, systolic CHF/stress-induced cardiomyopathy HTN, trivial AR, mild MR,OA knees, HL, prediabetes, CKD 3, ACD, lumbar radiculopathy, hx of vertebral fx receiving pain management through Dr. Joselyn Glassman at [facility].    Discussed the use of AI scribe software for clinical note transcription with the patient, who gave verbal consent to proceed.  History of Present Illness   The patient, with a history of diabetes, atrial fibrillation, congestive heart failure, and arthritis, presents for a routine follow-up visit. The patient's granddaughter is present for the visit.   OA knees:  The patient's chief complaint is the high cost of her diclofenac gel prescription. The patient reports that the cost of the medication has increased to forty dollars, which is a financial burden. The patient uses the gel for knee arthritis pain management.  She was previously getting 3 tubes of the diclofenac gel.  She is on Norco through pain management 7.5/325.  Reports no increase drowsiness with this medicine.  She has Narcan prescription. Gets around at home and her manual wheelchair which she is in today. Had soft fall getting out of the shower a week or 2 ago.  Her aide was with her.  She slipped while getting out of the shower but the aide was able to catch her and eased her onto the floor.  She sustained no injuries.  DM: Results for orders placed or performed in visit on 10/02/23  CBC  Result Value Ref Range   WBC 5.4 3.4 - 10.8 x10E3/uL   RBC 4.02 3.77 - 5.28 x10E6/uL   Hemoglobin 11.4 11.1 - 15.9 g/dL   Hematocrit 40.9 81.1 - 46.6 %   MCV 90 79 - 97 fL    MCH 28.4 26.6 - 33.0 pg   MCHC 31.6 31.5 - 35.7 g/dL   RDW 91.4 78.2 - 95.6 %   Platelets 268 150 - 450 x10E3/uL  Comprehensive metabolic panel  Result Value Ref Range   Glucose 106 (H) 70 - 99 mg/dL   BUN 24 8 - 27 mg/dL   Creatinine, Ser 2.13 0.57 - 1.00 mg/dL   eGFR 57 (L) >08 MV/HQI/6.96   BUN/Creatinine Ratio 25 12 - 28   Sodium 137 134 - 144 mmol/L   Potassium 4.5 3.5 - 5.2 mmol/L   Chloride 99 96 - 106 mmol/L   CO2 21 20 - 29 mmol/L   Calcium 10.1 8.7 - 10.3 mg/dL   Total Protein 6.7 6.0 - 8.5 g/dL   Albumin 4.5 3.7 - 4.7 g/dL   Globulin, Total 2.2 1.5 - 4.5 g/dL   Bilirubin Total 0.5 0.0 - 1.2 mg/dL   Alkaline Phosphatase 100 44 - 121 IU/L   AST 20 0 - 40 IU/L   ALT 7 0 - 32 IU/L  Lipid panel  Result Value Ref Range   Cholesterol, Total 100 100 - 199 mg/dL   Triglycerides 71 0 - 149 mg/dL   HDL 44 >29 mg/dL   VLDL Cholesterol Cal 15 5 - 40 mg/dL   LDL Chol Calc (NIH) 41 0 - 99 mg/dL   Chol/HDL Ratio 2.3 0.0 - 4.4 ratio  Microalbumin /  creatinine urine ratio  Result Value Ref Range   Creatinine, Urine 27.2 Not Estab. mg/dL   Microalbumin, Urine <9.6 Not Estab. ug/mL   Microalb/Creat Ratio <11 0 - 29 mg/g creat  POCT glycosylated hemoglobin (Hb A1C)  Result Value Ref Range   Hemoglobin A1C     HbA1c POC (<> result, manual entry)     HbA1c, POC (prediabetic range)     HbA1c, POC (controlled diabetic range) 6.1 0.0 - 7.0 %  POCT glucose (manual entry)  Result Value Ref Range   POC Glucose 72 70 - 99 mg/dl  On Farxiga 10mg  daily for diabetes management. BS mildly low today.  Given some cookies.  The patient's systolic blood pressure is slightly elevated at home, usually around 140. The patient denies any dizziness or lightheadedness.   HTN/atrial flutter/systolic CHF: The patient is also on a regimen of Eliquis, carvedilol, Entresto, spironolactone, and furosemide.  Takes the furosemide as needed only if she has lower extremity swelling. The patient takes  rosuvastatin four times a week for cholesterol management.     CKD 3: We have been monitoring her kidney function.  Last GFR in April of this year was 28.  Not on any oral NSAIDs.  Patient Active Problem List   Diagnosis Date Noted   Takotsubo cardiomyopathy 07/09/2023   Elevated troponin    Acute heart failure (HCC)    NSTEMI (non-ST elevated myocardial infarction) (HCC) 08/17/2022   Mitral stenosis 05/07/2021   Recurrent falls 04/24/2021   History of vertebral fracture 04/24/2021   Lumbar radiculopathy 04/24/2021   Anemia due to stage 3a chronic kidney disease (HCC) 03/07/2020   Stage 3a chronic kidney disease (HCC) 03/07/2020   Mixed incontinence urge and stress 03/09/2018   Prediabetes 03/09/2018   OA (osteoarthritis) of knee 03/09/2018   Sinus bradycardia 12/18/2016   Estrogen deficiency 07/03/2016   Mitral regurgitation 03/01/2014   Chronic atrial flutter (HCC) 03/01/2014   Permanent atrial fibrillation (HCC) 12/24/2013   Essential hypertension, benign 12/24/2013   Heart murmur 12/24/2013   Hyperlipemia 12/24/2013     Current Outpatient Medications on File Prior to Visit  Medication Sig Dispense Refill   carvedilol (COREG) 3.125 MG tablet Take 1 tablet (3.125 mg total) by mouth 2 (two) times daily with a meal. 180 tablet 1   Cholecalciferol (VITAMIN D3) 10 MCG (400 UNIT) tablet Take 2 tablets (800 Units total) by mouth daily. 120 tablet 2   cyclobenzaprine (FLEXERIL) 10 MG tablet Take 0.5 tablets (5 mg total) by mouth 2 (two) times daily as needed for muscle spasms. 20 tablet 0   dapagliflozin propanediol (FARXIGA) 10 MG TABS tablet Take 1 tablet (10 mg total) by mouth daily. 90 tablet 3   docusate sodium (COLACE) 100 MG capsule Take 100 mg by mouth daily.      ELIQUIS 5 MG TABS tablet TAKE 1 TABLET BY MOUTH TWICE A DAY 60 tablet 11   ferrous sulfate 325 (65 FE) MG tablet Take 1 tablet (325 mg total) by mouth daily with breakfast. 90 tablet 0   furosemide (LASIX) 20 MG  tablet TAKE 1 TABLET BY MOUTH EVERY DAY 90 tablet 3   HYDROcodone-acetaminophen (NORCO) 7.5-325 MG tablet Take 1 tablet by mouth 4 (four) times daily as needed.     Misc. Devices Paramedic. Devices MISC Rollator walker 1 Device 0   Misc. Devices MISC Hospital bed 1 Device 0   naloxone [facility]) nasal spray 4 mg/0.1 mL Place  1 spray into the nose once.     Omega-3 Fatty Acids (FISH OIL CONCENTRATE PO) Take 1 capsule by mouth daily.      rosuvastatin (CRESTOR) 10 MG tablet TAKE 1 TABLET BY MOUTH EVERY MONDAY, WEDNESDAY, FRIDAY, AND SATURDAYStrength: 10 mg 36 tablet 1   sacubitril-valsartan (ENTRESTO) 24-26 MG Take 1 tablet by mouth 2 (two) times daily. 180 tablet 3   spironolactone (ALDACTONE) 25 MG tablet Take 0.5 tablets (12.5 mg total) by mouth daily. 45 tablet 3   White Petrolatum-Mineral Oil (SYSTANE NIGHTTIME) OINT Place 1 drop into the left eye 4 (four) times daily.     No current facility-administered medications on file prior to visit.    No Known Allergies  Social History   Socioeconomic History   Marital status: Divorced    Spouse name: Not on file   Number of children: 3   Years of education: Not on file   Highest education level: Not on file  Occupational History   Occupation: retired  Tobacco Use   Smoking status: Never   Smokeless tobacco: Never  Vaping Use   Vaping status: Never Used  Substance and Sexual Activity   Alcohol use: No   Drug use: No   Sexual activity: Not on file  Other Topics Concern   Not on file  Social History Narrative   Not on file   Social Determinants of Health   Financial Resource Strain: Low Risk  (02/21/2023)   Overall Financial Resource Strain (CARDIA)    Difficulty of Paying Living Expenses: Not hard at all  Food Insecurity: No Food Insecurity (02/21/2023)   Hunger Vital Sign    Worried About Running Out of Food in the Last Year: Never true    Ran Out of Food in the Last Year: Never true   Transportation Needs: No Transportation Needs (02/21/2023)   PRAPARE - Administrator, Civil Service (Medical): No    Lack of Transportation (Non-Medical): No  Physical Activity: Inactive (02/21/2023)   Exercise Vital Sign    Days of Exercise per Week: 0 days    Minutes of Exercise per Session: 0 min  Stress: No Stress Concern Present (02/21/2023)   [employer] of Occupational Health - Occupational Stress Questionnaire    Feeling of Stress : Not at all  Social Connections: Not on file  Intimate Partner Violence: Not At Risk (02/21/2023)   Humiliation, Afraid, Rape, and Kick questionnaire    Fear of Current or Ex-Partner: No    Emotionally Abused: No    Physically Abused: No    Sexually Abused: No    Family History  Problem Relation Age of Onset   Alzheimer's disease Mother     Past Surgical History:  Procedure Laterality Date   CARDIOVERSION N/A 03/24/2014   Procedure: CARDIOVERSION;  Surgeon: Quintella Reichert, MD;  Location: [facility] ENDOSCOPY;  Service: Cardiovascular;  Laterality: N/A;   LEFT HEART CATH AND CORONARY ANGIOGRAPHY N/A 08/20/2022   Procedure: LEFT HEART CATH AND CORONARY ANGIOGRAPHY;  Surgeon: Corky Crafts, MD;  Location: [facility] INVASIVE CV LAB;  Service: Cardiovascular;  Laterality: N/A;    ROS: Review of Systems Negative except as stated above  PHYSICAL EXAM: BP (!) 158/75   Pulse (!) 47   Temp 98.1 F (36.7 C) (Oral)   Ht 5\' 4"  (1.626 m)   Wt 150 lb (68 kg)   SpO2 100%   BMI 25.75 kg/m   Physical Exam  General appearance - alert, well appearing, pleasant  elderly female sitting in wheelchair and in no distress Mental status -patient answers questions appropriately. Mouth - mucous membranes moist, pharynx normal without lesions Neck - supple, no significant adenopathy Chest - clear to auscultation, no wheezes, rales or rhonchi, symmetric air entry Heart -heart rate is irregularly irregular but rate controlled. Extremities -no lower  extremity edema.      Latest Ref Rng & Units 10/02/2023    4:39 PM 01/31/2023    2:47 PM 09/25/2022    2:48 PM  CMP  Glucose 70 - 99 mg/dL 784  696  295   BUN 8 - 27 mg/dL 24  34  29   Creatinine 0.57 - 1.00 mg/dL 2.84  1.32  4.40   Sodium 134 - 144 mmol/L 137  138  139   Potassium 3.5 - 5.2 mmol/L 4.5  4.8  4.3   Chloride 96 - 106 mmol/L 99  99  96   CO2 20 - 29 mmol/L 21  23  27    Calcium 8.7 - 10.3 mg/dL 10.2  9.9  72.5   Total Protein 6.0 - 8.5 g/dL 6.7  6.8    Total Bilirubin 0.0 - 1.2 mg/dL 0.5  0.4    Alkaline Phos 44 - 121 IU/L 100  81    AST 0 - 40 IU/L 20  17    ALT 0 - 32 IU/L 7  7     Lipid Panel     Component Value Date/Time   CHOL 100 10/02/2023 1639   TRIG 71 10/02/2023 1639   HDL 44 10/02/2023 1639   CHOLHDL 2.3 10/02/2023 1639   CHOLHDL 2.1 08/18/2022 0620   VLDL 12 08/18/2022 0620   LDLCALC 41 10/02/2023 1639    CBC    Component Value Date/Time   WBC 5.4 10/02/2023 1639   WBC 4.6 09/04/2022 1457   RBC 4.02 10/02/2023 1639   RBC 3.85 (L) 09/04/2022 1457   HGB 11.4 10/02/2023 1639   HCT 36.1 10/02/2023 1639   PLT 268 10/02/2023 1639   MCV 90 10/02/2023 1639   MCH 28.4 10/02/2023 1639   MCH 28.3 09/04/2022 1457   MCHC 31.6 10/02/2023 1639   MCHC 31.9 09/04/2022 1457   RDW 12.7 10/02/2023 1639   LYMPHSABS 0.7 08/21/2022 0227   LYMPHSABS 1.9 12/18/2016 1452   MONOABS 0.5 08/21/2022 0227   EOSABS 0.0 08/21/2022 0227   EOSABS 0.2 12/18/2016 1452   BASOSABS 0.0 08/21/2022 0227   BASOSABS 0.1 12/18/2016 1452    ASSESSMENT AND PLAN: 1. Diabetes mellitus treated with oral medication (HCC) At goal. Continue Farxiga - POCT glycosylated hemoglobin (Hb A1C) - POCT glucose (manual entry) - CBC - Comprehensive metabolic panel - Lipid panel - Microalbumin / creatinine urine ratio  2. Primary osteoarthritis of both knees On Norco through pain specialist but still finds Voltaren gel to be helpful.   - diclofenac Sodium (VOLTAREN) 1 % GEL; Apply 2  grams to knees 4 times a day as needed.  Dispense: 400 g; Refill: 2  3. Hypertension associated with type 2 diabetes mellitus (HCC) Not at goal but improve on repeat check today.  [name] to check BP 1-2 x a wk and send me results/call with results.  Continue carvedilol 3.25 mg twice a day Entresto, spironolactone.  Pulse rate to low to increase dose of Coreg.    4. Systolic congestive heart failure, unspecified HF chronicity (HCC) Stable continue carvedilol 3.25 mg twice a day, Entresto 25/26 mg twice a day, spironolactone 12.5 mg  daily, furosemide 20 mg daily, Farxiga 10 mg daily  5. Stage 3b chronic kidney disease (HCC) We will continue to monitor.  Chemistry ordered today.  6. Permanent atrial fibrillation (HCC) Stable on Eliquis and carvedilol  7. Need for COVID-19 vaccine Advised patient to get a COVID booster at any outside pharmacy  8. Encounter for immunization - Flu Vaccine Trivalent High Dose (Fluad)     Patient was given the opportunity to ask questions.  Patient verbalized understanding of the plan and was able to repeat key elements of the plan.   This documentation was completed using Paediatric nurse.  Any transcriptional errors are unintentional.  Orders Placed This Encounter  Procedures   Flu Vaccine Trivalent High Dose (Fluad)   CBC   Comprehensive metabolic panel   Lipid panel   Microalbumin / creatinine urine ratio   POCT glycosylated hemoglobin (Hb A1C)   POCT glucose (manual entry)     Requested Prescriptions   Signed Prescriptions Disp Refills   diclofenac Sodium (VOLTAREN) 1 % GEL 400 g 2    Sig: Apply 2 grams to knees 4 times a day as needed.    Return in about 4 months (around 01/31/2024).  Jonah Blue, MD, FACP

## 2023-10-03 ENCOUNTER — Telehealth: Payer: Self-pay | Admitting: Cardiology

## 2023-10-03 ENCOUNTER — Encounter: Payer: Self-pay | Admitting: Cardiology

## 2023-10-03 NOTE — Telephone Encounter (Signed)
Error

## 2023-10-03 NOTE — Telephone Encounter (Signed)
Pt granddaughter called in stating pt was told to take fluid as needed instead of everyday due to kidney function not well. She stated she was not sure who made this change but wanted to make sure this was notated.

## 2023-10-04 LAB — COMPREHENSIVE METABOLIC PANEL
ALT: 7 [IU]/L (ref 0–32)
AST: 20 [IU]/L (ref 0–40)
Albumin: 4.5 g/dL (ref 3.7–4.7)
Alkaline Phosphatase: 100 [IU]/L (ref 44–121)
BUN/Creatinine Ratio: 25 (ref 12–28)
BUN: 24 mg/dL (ref 8–27)
Bilirubin Total: 0.5 mg/dL (ref 0.0–1.2)
CO2: 21 mmol/L (ref 20–29)
Calcium: 10.1 mg/dL (ref 8.7–10.3)
Chloride: 99 mmol/L (ref 96–106)
Creatinine, Ser: 0.97 mg/dL (ref 0.57–1.00)
Globulin, Total: 2.2 g/dL (ref 1.5–4.5)
Glucose: 106 mg/dL — ABNORMAL HIGH (ref 70–99)
Potassium: 4.5 mmol/L (ref 3.5–5.2)
Sodium: 137 mmol/L (ref 134–144)
Total Protein: 6.7 g/dL (ref 6.0–8.5)
eGFR: 57 mL/min/{1.73_m2} — ABNORMAL LOW (ref >59)

## 2023-10-04 LAB — MICROALBUMIN / CREATININE URINE RATIO: Creatinine, Urine: 27.2 mg/dL

## 2023-10-04 LAB — CBC
Hematocrit: 36.1 % (ref 34.0–46.6)
Hemoglobin: 11.4 g/dL (ref 11.1–15.9)
MCH: 28.4 pg (ref 26.6–33.0)
MCHC: 31.6 g/dL (ref 31.5–35.7)
MCV: 90 fL (ref 79–97)
Platelets: 268 10*3/uL (ref 150–450)
RBC: 4.02 x10E6/uL (ref 3.77–5.28)
RDW: 12.7 % (ref 11.7–15.4)
WBC: 5.4 10*3/uL (ref 3.4–10.8)

## 2023-10-04 LAB — LIPID PANEL
Chol/HDL Ratio: 2.3 {ratio} (ref 0.0–4.4)
Cholesterol, Total: 100 mg/dL (ref 100–199)
HDL: 44 mg/dL (ref >39)
LDL Chol Calc (NIH): 41 mg/dL (ref 0–99)
Triglycerides: 71 mg/dL (ref 0–149)
VLDL Cholesterol Cal: 15 mg/dL (ref 5–40)

## 2023-10-05 ENCOUNTER — Encounter: Payer: Self-pay | Admitting: Internal Medicine

## 2023-10-06 ENCOUNTER — Telehealth: Payer: Self-pay

## 2023-10-06 MED ORDER — FUROSEMIDE 20 MG PO TABS: 20.0000 mg | ORAL_TABLET | ORAL | 3 refills | Status: DC | PRN

## 2023-10-06 NOTE — Telephone Encounter (Signed)
Updated lasix orders to as needed.

## 2023-10-06 NOTE — Telephone Encounter (Signed)
Pt's granddaughter Anna [name] given lab results per notes of Dr. Laural Benes on 10/03/23. [name] verbalized understanding.  Marcine Matar, MD 10/03/2023  1:31 PM EST     Let patient or her granddaughter Anna [name] know that kidney function is not at 100% but improved from when it was last checked in April.  Liver function test normal.  Blood cell count in the low normal range but stable.  Continue iron supplement.  Cholesterol levels are normal.

## 2023-10-06 NOTE — Addendum Note (Signed)
Addended by: Luellen Pucker on: 10/06/2023 04:25 PM   Modules accepted: Orders

## 2023-10-06 NOTE — Telephone Encounter (Signed)
Call back to grand-daughter Anna [name] (DPR), Anna [name] wants to advise that her grandmother Anna [name] has only been taking her lasix PRN. Patient is now wheelchair bound and has issues with incontinence. Since she is not short or breath or having difficulty with swollen legs, Anna [name] states patient has rarely taken her lasix in several months. She is requesting that lasix order be updated to PRN. Last creatinine was 10/02/23 and it was 0.97. Anna [name] endorses that patient has been taking her spironolactone as ordered. Anna [name] also endorses that patient was seen last week at PCP office and HR was 47, BP was 158/75.

## 2023-10-07 ENCOUNTER — Telehealth: Payer: Self-pay | Admitting: *Deleted

## 2023-10-07 ENCOUNTER — Other Ambulatory Visit: Payer: Self-pay

## 2023-10-07 ENCOUNTER — Ambulatory Visit (HOSPITAL_COMMUNITY)
Admission: RE | Admit: 2023-10-07 | Discharge: 2023-10-07 | Discharge status: Home / Self Care | Payer: 59 | Source: Ambulatory Visit | Attending: Physician Assistant | Admitting: Physician Assistant

## 2023-10-07 DIAGNOSIS — I3481 Nonrheumatic mitral (valve) annulus calcification: Secondary | ICD-10-CM | POA: Insufficient documentation

## 2023-10-07 DIAGNOSIS — I420 Dilated cardiomyopathy: Secondary | ICD-10-CM | POA: Diagnosis present

## 2023-10-07 DIAGNOSIS — I342 Nonrheumatic mitral (valve) stenosis: Secondary | ICD-10-CM | POA: Insufficient documentation

## 2023-10-07 DIAGNOSIS — I10 Essential (primary) hypertension: Secondary | ICD-10-CM | POA: Diagnosis not present

## 2023-10-07 LAB — ECHOCARDIOGRAM COMPLETE
AR max vel: 1.94 cm2
AV Area VTI: 2.05 cm2
AV Area mean vel: 1.87 cm2
AV Mean grad: 4 mm[Hg]
AV Peak grad: 6.8 mm[Hg]
Ao pk vel: 1.31 m/s
Area-P 1/2: 3.76 cm2
S' Lateral: 3.2 cm

## 2023-10-07 NOTE — Telephone Encounter (Signed)
Noted  

## 2023-10-07 NOTE — Telephone Encounter (Signed)
Pt granddaughter [name] given lab results per notes of Dr. Laural Benes from 10/03/23 on 10/07/23. Pt granddaughter verbalized understanding.

## 2023-10-14 ENCOUNTER — Telehealth: Payer: Self-pay

## 2023-10-14 NOTE — Telephone Encounter (Signed)
Pt given lab results per notes of Dr. Laural Benes on 10/14/23. Pt verbalized understanding.   Marcine Matar, MD 10/03/2023  1:31 PM EST     Let patient or her granddaughter [name] know that kidney function is not at 100% but improved from when it was last checked in April.  Liver function test normal.  Blood cell count in the low normal range but stable.  Continue iron supplement.  Cholesterol levels are normal.

## 2023-10-20 ENCOUNTER — Telehealth: Payer: Self-pay

## 2023-10-20 NOTE — Telephone Encounter (Signed)
Grand daughter made aware and appreciative for call return.

## 2023-10-20 NOTE — Telephone Encounter (Signed)
Let patient's granddaughter know that I received the blood pressure readings.  They look good.  No changes in medications.

## 2023-10-20 NOTE — Telephone Encounter (Signed)
Copied from CRM [id number]. Topic: General - Other >> Oct 20, 2023 12:51 PM Shon Hale wrote: Reason for CRM: [name] calling in with BP readings for pt per Dr. Henriette Combs request.   Two weeks - 12/7 - 12/19 12/7: 130/51  12/8: 122/41  12/9: 126/49 12/10: 136/62 12/11: 133/33  12/12: 120/49 12/13: 124/48 am 109/35 pm      12/14: 124/48 12/15: 122/49       12/16: 116/49 12/17: 116/46     12/18: 113/46 am 109/51 pm 12/19: 114/42 am 157/52 pm   [name] can be contacted for any further information

## 2023-10-24 ENCOUNTER — Other Ambulatory Visit: Payer: Self-pay | Admitting: Cardiology

## 2023-11-05 ENCOUNTER — Other Ambulatory Visit: Payer: Self-pay

## 2023-11-06 ENCOUNTER — Other Ambulatory Visit: Payer: Self-pay

## 2023-11-27 ENCOUNTER — Other Ambulatory Visit: Payer: Self-pay | Admitting: Internal Medicine

## 2023-12-07 ENCOUNTER — Other Ambulatory Visit: Payer: Self-pay | Admitting: Internal Medicine

## 2023-12-07 DIAGNOSIS — I5020 Unspecified systolic (congestive) heart failure: Secondary | ICD-10-CM

## 2023-12-12 ENCOUNTER — Inpatient Hospital Stay (HOSPITAL_COMMUNITY)
Admission: EM | Admit: 2023-12-12 | Discharge: 2023-12-16 | DRG: 418 | Disposition: A | Discharge status: Home Health | Payer: 59 | Attending: Internal Medicine | Admitting: Internal Medicine

## 2023-12-12 ENCOUNTER — Encounter (HOSPITAL_COMMUNITY): Payer: Self-pay | Admitting: Emergency Medicine

## 2023-12-12 ENCOUNTER — Emergency Department (HOSPITAL_COMMUNITY): Payer: 59

## 2023-12-12 DIAGNOSIS — K5750 Diverticulosis of both small and large intestine without perforation or abscess without bleeding: Secondary | ICD-10-CM | POA: Diagnosis present

## 2023-12-12 DIAGNOSIS — K810 Acute cholecystitis: Secondary | ICD-10-CM | POA: Diagnosis present

## 2023-12-12 DIAGNOSIS — N189 Chronic kidney disease, unspecified: Secondary | ICD-10-CM

## 2023-12-12 DIAGNOSIS — E872 Acidosis, unspecified: Secondary | ICD-10-CM | POA: Diagnosis present

## 2023-12-12 DIAGNOSIS — K819 Cholecystitis, unspecified: Principal | ICD-10-CM

## 2023-12-12 DIAGNOSIS — Z79899 Other long term (current) drug therapy: Secondary | ICD-10-CM

## 2023-12-12 DIAGNOSIS — K8000 Calculus of gallbladder with acute cholecystitis without obstruction: Secondary | ICD-10-CM | POA: Diagnosis not present

## 2023-12-12 DIAGNOSIS — N179 Acute kidney failure, unspecified: Secondary | ICD-10-CM

## 2023-12-12 DIAGNOSIS — I130 Hypertensive heart and chronic kidney disease with heart failure and stage 1 through stage 4 chronic kidney disease, or unspecified chronic kidney disease: Secondary | ICD-10-CM | POA: Diagnosis present

## 2023-12-12 DIAGNOSIS — I4821 Permanent atrial fibrillation: Secondary | ICD-10-CM | POA: Diagnosis present

## 2023-12-12 DIAGNOSIS — I5022 Chronic systolic (congestive) heart failure: Secondary | ICD-10-CM

## 2023-12-12 DIAGNOSIS — N838 Other noninflammatory disorders of ovary, fallopian tube and broad ligament: Secondary | ICD-10-CM

## 2023-12-12 DIAGNOSIS — I447 Left bundle-branch block, unspecified: Secondary | ICD-10-CM | POA: Diagnosis present

## 2023-12-12 DIAGNOSIS — E785 Hyperlipidemia, unspecified: Secondary | ICD-10-CM | POA: Diagnosis present

## 2023-12-12 DIAGNOSIS — R7303 Prediabetes: Secondary | ICD-10-CM | POA: Diagnosis present

## 2023-12-12 DIAGNOSIS — E871 Hypo-osmolality and hyponatremia: Secondary | ICD-10-CM | POA: Diagnosis not present

## 2023-12-12 DIAGNOSIS — I5032 Chronic diastolic (congestive) heart failure: Secondary | ICD-10-CM

## 2023-12-12 DIAGNOSIS — I5181 Takotsubo syndrome: Secondary | ICD-10-CM | POA: Diagnosis present

## 2023-12-12 DIAGNOSIS — N182 Chronic kidney disease, stage 2 (mild): Secondary | ICD-10-CM

## 2023-12-12 DIAGNOSIS — Z7901 Long term (current) use of anticoagulants: Secondary | ICD-10-CM

## 2023-12-12 DIAGNOSIS — G894 Chronic pain syndrome: Secondary | ICD-10-CM

## 2023-12-12 DIAGNOSIS — N1831 Chronic kidney disease, stage 3a: Secondary | ICD-10-CM | POA: Diagnosis present

## 2023-12-12 DIAGNOSIS — I2510 Atherosclerotic heart disease of native coronary artery without angina pectoris: Secondary | ICD-10-CM | POA: Diagnosis present

## 2023-12-12 HISTORY — DX: Cardiac arrhythmia, unspecified: I49.9

## 2023-12-12 HISTORY — DX: Takotsubo syndrome: I51.81

## 2023-12-12 HISTORY — DX: Atherosclerotic heart disease of native coronary artery without angina pectoris: I25.10

## 2023-12-12 LAB — CBC WITH DIFFERENTIAL/PLATELET
Abs Immature Granulocytes: 0.04 10*3/uL (ref 0.00–0.07)
Basophils Absolute: 0 10*3/uL (ref 0.0–0.1)
Basophils Relative: 1 %
Eosinophils Absolute: 0 10*3/uL (ref 0.0–0.5)
Eosinophils Relative: 1 %
HCT: 35.7 % — ABNORMAL LOW (ref 36.0–46.0)
Hemoglobin: 11.3 g/dL — ABNORMAL LOW (ref 12.0–15.0)
Immature Granulocytes: 1 %
Lymphocytes Relative: 13 %
Lymphs Abs: 1.1 10*3/uL (ref 0.7–4.0)
MCH: 28.8 pg (ref 26.0–34.0)
MCHC: 31.7 g/dL (ref 30.0–36.0)
MCV: 90.8 fL (ref 80.0–100.0)
Monocytes Absolute: 0.4 10*3/uL (ref 0.1–1.0)
Monocytes Relative: 4 %
Neutro Abs: 6.4 10*3/uL (ref 1.7–7.7)
Neutrophils Relative %: 80 %
Platelets: 220 10*3/uL (ref 150–400)
RBC: 3.93 MIL/uL (ref 3.87–5.11)
RDW: 13.5 % (ref 11.5–15.5)
WBC: 7.9 10*3/uL (ref 4.0–10.5)
nRBC: 0 % (ref 0.0–0.2)

## 2023-12-12 LAB — COMPREHENSIVE METABOLIC PANEL
ALT: 23 U/L (ref 0–44)
AST: 46 U/L — ABNORMAL HIGH (ref 15–41)
Albumin: 4 g/dL (ref 3.5–5.0)
Alkaline Phosphatase: 111 U/L (ref 38–126)
Anion gap: 13 (ref 5–15)
BUN: 17 mg/dL (ref 8–23)
CO2: 22 mmol/L (ref 22–32)
Calcium: 9.8 mg/dL (ref 8.9–10.3)
Chloride: 101 mmol/L (ref 98–111)
Creatinine, Ser: 1.15 mg/dL — ABNORMAL HIGH (ref 0.44–1.00)
GFR, Estimated: 46 mL/min — ABNORMAL LOW (ref >60)
Glucose, Bld: 151 mg/dL — ABNORMAL HIGH (ref 70–99)
Potassium: 4.2 mmol/L (ref 3.5–5.1)
Sodium: 136 mmol/L (ref 135–145)
Total Bilirubin: 1.7 mg/dL — ABNORMAL HIGH (ref 0.0–1.2)
Total Protein: 7.1 g/dL (ref 6.5–8.1)

## 2023-12-12 LAB — LIPASE, BLOOD: Lipase: 25 U/L (ref 11–51)

## 2023-12-12 MED ORDER — IOHEXOL 350 MG/ML SOLN
75.0000 mL | Freq: Once | INTRAVENOUS | Status: AC | PRN
  Administered 2023-12-12: 75 mL via INTRAVENOUS

## 2023-12-12 MED ORDER — ONDANSETRON HCL 4 MG/2ML IJ SOLN
4.0000 mg | Freq: Once | INTRAMUSCULAR | Status: AC
  Administered 2023-12-12: 4 mg via INTRAVENOUS
  Filled 2023-12-12: qty 2

## 2023-12-12 MED ORDER — FENTANYL CITRATE PF 50 MCG/ML IJ SOSY
25.0000 ug | PREFILLED_SYRINGE | Freq: Once | INTRAMUSCULAR | Status: AC
  Administered 2023-12-12: 25 ug via INTRAVENOUS
  Filled 2023-12-12: qty 1

## 2023-12-12 NOTE — ED Provider Triage Note (Signed)
Emergency Medicine Provider Triage Evaluation Note  DORTHY MAGNUSSEN , a 88 y.o. female  was evaluated in triage.  Pt complains of abdominal pain.  Began this AM, nausea, decreased appetite. Right sided. Has had hard stool, no diarrhea, no vomiting, no fever, no dysuria. Has not been able to urinate today but also does not feel like she needs to.  Pain is severe. Never had it before. Has not tried to eat since it started today. No hx of surgeries.   Review of Systems  Positive: See above Negative: See above  Physical Exam  BP (!) 134/96 (BP Location: Right Arm)   Pulse 60   Temp 97.7 F (36.5 C)   Resp 18   SpO2 98%  Gen:   Awake, in pain   Resp:  Normal effort  MSK:   Moves extremities without difficulty, normal distal pulses Other:  +guarding, RUQ abdominal pain, +Murphy's   Medical Decision Making  Medically screening exam initiated at 3:42 PM.  Appropriate orders placed.  DANNIELLE BASKINS was informed that the remainder of the evaluation will be completed by another provider, this initial triage assessment does not replace that evaluation, and the importance of remaining in the ED until their evaluation is complete.  Ordered labs, ct, [place], pain medications.    Alvira Monday, MD 12/12/23 1544

## 2023-12-12 NOTE — ED Triage Notes (Signed)
Pt BIB by EMS for RUQ pain and urinary retention. Reports last UOP yesterday and dark colored.Tenderness on exam. Family states pt had firmer bowel movements than normal but no constipation. Reports nausea but denies vomiting or diarrhea.

## 2023-12-13 ENCOUNTER — Encounter (HOSPITAL_COMMUNITY): Payer: Self-pay | Admitting: Internal Medicine

## 2023-12-13 ENCOUNTER — Inpatient Hospital Stay (HOSPITAL_COMMUNITY): Payer: 59

## 2023-12-13 ENCOUNTER — Other Ambulatory Visit: Payer: Self-pay

## 2023-12-13 DIAGNOSIS — Z01810 Encounter for preprocedural cardiovascular examination: Secondary | ICD-10-CM

## 2023-12-13 DIAGNOSIS — Z7901 Long term (current) use of anticoagulants: Secondary | ICD-10-CM | POA: Diagnosis not present

## 2023-12-13 DIAGNOSIS — K8050 Calculus of bile duct without cholangitis or cholecystitis without obstruction: Secondary | ICD-10-CM | POA: Diagnosis not present

## 2023-12-13 DIAGNOSIS — N838 Other noninflammatory disorders of ovary, fallopian tube and broad ligament: Secondary | ICD-10-CM

## 2023-12-13 DIAGNOSIS — I5181 Takotsubo syndrome: Secondary | ICD-10-CM | POA: Diagnosis not present

## 2023-12-13 DIAGNOSIS — N182 Chronic kidney disease, stage 2 (mild): Secondary | ICD-10-CM

## 2023-12-13 DIAGNOSIS — Z7189 Other specified counseling: Secondary | ICD-10-CM | POA: Diagnosis not present

## 2023-12-13 DIAGNOSIS — I5022 Chronic systolic (congestive) heart failure: Secondary | ICD-10-CM | POA: Diagnosis not present

## 2023-12-13 DIAGNOSIS — I4821 Permanent atrial fibrillation: Secondary | ICD-10-CM | POA: Diagnosis present

## 2023-12-13 DIAGNOSIS — E785 Hyperlipidemia, unspecified: Secondary | ICD-10-CM | POA: Diagnosis present

## 2023-12-13 DIAGNOSIS — I110 Hypertensive heart disease with heart failure: Secondary | ICD-10-CM | POA: Diagnosis not present

## 2023-12-13 DIAGNOSIS — G894 Chronic pain syndrome: Secondary | ICD-10-CM | POA: Diagnosis present

## 2023-12-13 DIAGNOSIS — N189 Chronic kidney disease, unspecified: Secondary | ICD-10-CM

## 2023-12-13 DIAGNOSIS — I130 Hypertensive heart and chronic kidney disease with heart failure and stage 1 through stage 4 chronic kidney disease, or unspecified chronic kidney disease: Secondary | ICD-10-CM | POA: Diagnosis present

## 2023-12-13 DIAGNOSIS — I447 Left bundle-branch block, unspecified: Secondary | ICD-10-CM | POA: Diagnosis present

## 2023-12-13 DIAGNOSIS — I2510 Atherosclerotic heart disease of native coronary artery without angina pectoris: Secondary | ICD-10-CM | POA: Diagnosis present

## 2023-12-13 DIAGNOSIS — R7303 Prediabetes: Secondary | ICD-10-CM | POA: Diagnosis present

## 2023-12-13 DIAGNOSIS — K810 Acute cholecystitis: Secondary | ICD-10-CM | POA: Diagnosis present

## 2023-12-13 DIAGNOSIS — Z79899 Other long term (current) drug therapy: Secondary | ICD-10-CM | POA: Diagnosis not present

## 2023-12-13 DIAGNOSIS — E871 Hypo-osmolality and hyponatremia: Secondary | ICD-10-CM | POA: Diagnosis not present

## 2023-12-13 DIAGNOSIS — N1831 Chronic kidney disease, stage 3a: Secondary | ICD-10-CM | POA: Diagnosis present

## 2023-12-13 DIAGNOSIS — K5750 Diverticulosis of both small and large intestine without perforation or abscess without bleeding: Secondary | ICD-10-CM | POA: Diagnosis present

## 2023-12-13 DIAGNOSIS — K8000 Calculus of gallbladder with acute cholecystitis without obstruction: Secondary | ICD-10-CM | POA: Insufficient documentation

## 2023-12-13 DIAGNOSIS — E872 Acidosis, unspecified: Secondary | ICD-10-CM | POA: Diagnosis present

## 2023-12-13 DIAGNOSIS — K819 Cholecystitis, unspecified: Secondary | ICD-10-CM | POA: Diagnosis not present

## 2023-12-13 DIAGNOSIS — I5032 Chronic diastolic (congestive) heart failure: Secondary | ICD-10-CM

## 2023-12-13 DIAGNOSIS — K838 Other specified diseases of biliary tract: Secondary | ICD-10-CM | POA: Diagnosis not present

## 2023-12-13 DIAGNOSIS — I509 Heart failure, unspecified: Secondary | ICD-10-CM | POA: Diagnosis not present

## 2023-12-13 LAB — CBC
HCT: 32.5 % — ABNORMAL LOW (ref 36.0–46.0)
Hemoglobin: 10.3 g/dL — ABNORMAL LOW (ref 12.0–15.0)
MCH: 28.9 pg (ref 26.0–34.0)
MCHC: 31.7 g/dL (ref 30.0–36.0)
MCV: 91 fL (ref 80.0–100.0)
Platelets: 207 10*3/uL (ref 150–400)
RBC: 3.57 MIL/uL — ABNORMAL LOW (ref 3.87–5.11)
RDW: 13.6 % (ref 11.5–15.5)
WBC: 12.7 10*3/uL — ABNORMAL HIGH (ref 4.0–10.5)
nRBC: 0 % (ref 0.0–0.2)

## 2023-12-13 LAB — COMPREHENSIVE METABOLIC PANEL
ALT: 60 U/L — ABNORMAL HIGH (ref 0–44)
AST: 141 U/L — ABNORMAL HIGH (ref 15–41)
Albumin: 3.3 g/dL — ABNORMAL LOW (ref 3.5–5.0)
Alkaline Phosphatase: 121 U/L (ref 38–126)
Anion gap: 12 (ref 5–15)
BUN: 17 mg/dL (ref 8–23)
CO2: 22 mmol/L (ref 22–32)
Calcium: 9.3 mg/dL (ref 8.9–10.3)
Chloride: 98 mmol/L (ref 98–111)
Creatinine, Ser: 1.23 mg/dL — ABNORMAL HIGH (ref 0.44–1.00)
GFR, Estimated: 43 mL/min — ABNORMAL LOW (ref >60)
Glucose, Bld: 141 mg/dL — ABNORMAL HIGH (ref 70–99)
Potassium: 4.3 mmol/L (ref 3.5–5.1)
Sodium: 132 mmol/L — ABNORMAL LOW (ref 135–145)
Total Bilirubin: 3.7 mg/dL — ABNORMAL HIGH (ref 0.0–1.2)
Total Protein: 6.1 g/dL — ABNORMAL LOW (ref 6.5–8.1)

## 2023-12-13 MED ORDER — DICLOFENAC SODIUM 1 % EX GEL: 2.0000 g | Freq: Four times a day (QID) | CUTANEOUS | Status: DC | PRN

## 2023-12-13 MED ORDER — ACETAMINOPHEN 325 MG PO TABS: 650.0000 mg | ORAL_TABLET | Freq: Four times a day (QID) | ORAL | Status: DC | PRN

## 2023-12-13 MED ORDER — SODIUM CHLORIDE 0.9 % IV SOLN
2.0000 g | Freq: Once | INTRAVENOUS | Status: AC
  Administered 2023-12-13: 2 g via INTRAVENOUS
  Filled 2023-12-13: qty 20

## 2023-12-13 MED ORDER — DICLOFENAC SODIUM 1 % EX GEL
2.0000 g | Freq: Four times a day (QID) | CUTANEOUS | Status: DC | PRN
  Administered 2023-12-13 – 2023-12-15 (×5): 2 g via TOPICAL
  Filled 2023-12-13: qty 100

## 2023-12-13 MED ORDER — ONDANSETRON HCL 4 MG PO TABS: 4.0000 mg | ORAL_TABLET | Freq: Four times a day (QID) | ORAL | Status: DC | PRN

## 2023-12-13 MED ORDER — LACTATED RINGERS IV BOLUS
500.0000 mL | Freq: Once | INTRAVENOUS | Status: AC
  Administered 2023-12-13: 500 mL via INTRAVENOUS

## 2023-12-13 MED ORDER — LACTATED RINGERS IV SOLN: INTRAVENOUS | Status: AC

## 2023-12-13 MED ORDER — SODIUM CHLORIDE 0.9% FLUSH: 3.0000 mL | INTRAVENOUS | Status: DC | PRN

## 2023-12-13 MED ORDER — CARVEDILOL 3.125 MG PO TABS: 3.1250 mg | ORAL_TABLET | Freq: Two times a day (BID) | ORAL | Status: DC

## 2023-12-13 MED ORDER — SODIUM CHLORIDE 0.9% FLUSH
3.0000 mL | Freq: Two times a day (BID) | INTRAVENOUS | Status: DC
  Administered 2023-12-14 – 2023-12-16 (×4): 3 mL via INTRAVENOUS

## 2023-12-13 MED ORDER — ARTIFICIAL TEARS OPHTHALMIC OINT
1.0000 | TOPICAL_OINTMENT | Freq: Four times a day (QID) | OPHTHALMIC | Status: DC
  Administered 2023-12-14 – 2023-12-16 (×9): 1 via OPHTHALMIC
  Filled 2023-12-13 (×2): qty 3.5

## 2023-12-13 MED ORDER — SODIUM CHLORIDE 0.9 % IV SOLN: 250.0000 mL | INTRAVENOUS | Status: AC | PRN

## 2023-12-13 MED ORDER — ROSUVASTATIN CALCIUM 5 MG PO TABS: 10.0000 mg | ORAL_TABLET | Freq: Every day | ORAL | Status: DC

## 2023-12-13 MED ORDER — SODIUM CHLORIDE 0.9 % IV SOLN
500.0000 mg | Freq: Once | INTRAVENOUS | Status: AC
  Administered 2023-12-13: 500 mg via INTRAVENOUS
  Filled 2023-12-13: qty 5

## 2023-12-13 MED ORDER — METRONIDAZOLE 500 MG/100ML IV SOLN
500.0000 mg | Freq: Two times a day (BID) | INTRAVENOUS | Status: DC
Start: 2023-12-13 — End: 2023-12-20
  Administered 2023-12-13 – 2023-12-16 (×7): 500 mg via INTRAVENOUS
  Filled 2023-12-13 (×7): qty 100

## 2023-12-13 MED ORDER — ROSUVASTATIN CALCIUM 5 MG PO TABS
10.0000 mg | ORAL_TABLET | ORAL | Status: DC
  Administered 2023-12-13: 10 mg via ORAL
  Filled 2023-12-13: qty 2

## 2023-12-13 MED ORDER — ONDANSETRON HCL 4 MG/2ML IJ SOLN
4.0000 mg | Freq: Four times a day (QID) | INTRAMUSCULAR | Status: DC | PRN
  Administered 2023-12-13: 4 mg via INTRAVENOUS
  Filled 2023-12-13: qty 2

## 2023-12-13 MED ORDER — CEFTRIAXONE SODIUM 2 G IJ SOLR
2.0000 g | INTRAMUSCULAR | Status: DC
  Administered 2023-12-13 – 2023-12-15 (×3): 2 g via INTRAVENOUS
  Filled 2023-12-13 (×3): qty 20

## 2023-12-13 MED ORDER — ACETAMINOPHEN 650 MG RE SUPP: 650.0000 mg | Freq: Four times a day (QID) | RECTAL | Status: DC | PRN

## 2023-12-13 MED ORDER — DAPAGLIFLOZIN PROPANEDIOL 10 MG PO TABS: 10.0000 mg | ORAL_TABLET | Freq: Every day | ORAL | Status: DC

## 2023-12-13 MED ORDER — SENNOSIDES-DOCUSATE SODIUM 8.6-50 MG PO TABS: 1.0000 | ORAL_TABLET | Freq: Every evening | ORAL | Status: DC | PRN

## 2023-12-13 MED ORDER — HYDROCODONE-ACETAMINOPHEN 7.5-325 MG PO TABS
1.0000 | ORAL_TABLET | Freq: Four times a day (QID) | ORAL | Status: DC | PRN
  Administered 2023-12-13 – 2023-12-16 (×7): 1 via ORAL
  Filled 2023-12-13 (×7): qty 1

## 2023-12-13 MED ORDER — FERROUS SULFATE 325 (65 FE) MG PO TABS
325.0000 mg | ORAL_TABLET | Freq: Every day | ORAL | Status: DC
  Administered 2023-12-13: 325 mg via ORAL
  Filled 2023-12-13: qty 1

## 2023-12-13 MED ORDER — SACUBITRIL-VALSARTAN 24-26 MG PO TABS: 1.0000 | ORAL_TABLET | Freq: Two times a day (BID) | ORAL | Status: DC

## 2023-12-13 MED ORDER — GADOBUTROL 1 MMOL/ML IV SOLN
8.0000 mL | Freq: Once | INTRAVENOUS | Status: AC | PRN
  Administered 2023-12-13: 8 mL via INTRAVENOUS

## 2023-12-13 MED ORDER — MORPHINE SULFATE (PF) 2 MG/ML IV SOLN
1.0000 mg | INTRAVENOUS | Status: DC | PRN
  Administered 2023-12-13 – 2023-12-15 (×3): 1 mg via INTRAVENOUS
  Filled 2023-12-13 (×3): qty 1

## 2023-12-13 MED ORDER — CYCLOBENZAPRINE HCL 5 MG PO TABS
5.0000 mg | ORAL_TABLET | Freq: Two times a day (BID) | ORAL | Status: DC | PRN
  Administered 2023-12-13 – 2023-12-15 (×3): 5 mg via ORAL
  Filled 2023-12-13 (×3): qty 1

## 2023-12-13 MED ORDER — SPIRONOLACTONE 12.5 MG HALF TABLET: 12.5000 mg | ORAL_TABLET | Freq: Every day | ORAL | Status: DC

## 2023-12-13 NOTE — Consult Note (Signed)
Reason for Consult: ? Choledocholithiasis Referring Physician: [facility]  [name] HPI: This is an 88 year old female with a PMH of afib on anticoagulation,CKD, CHF with an EF of 55-60%, mitral regurg, aortic regurg, and osteoarthritis admitted for complaints of RUQ pain.  Imaging revealed that the patient has an acute cholecystitis and there was evidence of biliary ductal dilation with the CT scan.  An MRCP was performed, but the images were degraded secondary to her respirations.  There was no clear evidence of choledocholithiasis, but the image quality was poor.  The liver enzymes at the time of admission only showed a mild elevation in her AST at 46, but today they were more elevated:  AST 141, ALT 60, TB 3.7.  Currently she feels well and Cardiology cleared her for procedures and surgery.  Past Medical History:  Diagnosis Date   Hyperlipidemia    Hypertension    Mild CAD    Mitral regurgitation    trivial   Mitral stenosis    mild   Osteoarthritis    Overweight(278.02)    Permanent atrial fibrillation (HCC)    Takotsubo cardiomyopathy     Past Surgical History:  Procedure Laterality Date   CARDIOVERSION N/A 03/24/2014   Procedure: CARDIOVERSION;  Surgeon: Quintella Reichert, MD;  Location: [facility] ENDOSCOPY;  Service: Cardiovascular;  Laterality: N/A;   LEFT HEART CATH AND CORONARY ANGIOGRAPHY N/A 08/20/2022   Procedure: LEFT HEART CATH AND CORONARY ANGIOGRAPHY;  Surgeon: Corky Crafts, MD;  Location: [facility] INVASIVE CV LAB;  Service: Cardiovascular;  Laterality: N/A;    Family History  Problem Relation Age of Onset   Alzheimer's disease Mother     Social History:  reports that she has never smoked. She has never used smokeless tobacco. She reports that she does not drink alcohol and does not use drugs.  Allergies: No Known Allergies  Medications: Scheduled:  sodium chloride flush  3 mL Intravenous Q12H   sodium chloride flush  3 mL Intravenous Q12H    Continuous:  sodium chloride     cefTRIAXone (ROCEPHIN)  IV     lactated ringers 75 mL/hr at 12/13/23 0847   metronidazole Stopped (12/13/23 0735)    Results for orders placed or performed during the hospital encounter of 12/12/23 (from the past 24 hours)  CBC with Differential     Status: Abnormal   Collection Time: 12/12/23  3:41 PM  Result Value Ref Range   WBC 7.9 4.0 - 10.5 K/uL   RBC 3.93 3.87 - 5.11 MIL/uL   Hemoglobin 11.3 (L) 12.0 - 15.0 g/dL   HCT 16.1 (L) 09.6 - 04.5 %   MCV 90.8 80.0 - 100.0 fL   MCH 28.8 26.0 - 34.0 pg   MCHC 31.7 30.0 - 36.0 g/dL   RDW 40.9 81.1 - 91.4 %   Platelets 220 150 - 400 K/uL   nRBC 0.0 0.0 - 0.2 %   Neutrophils Relative % 80 %   Neutro Abs 6.4 1.7 - 7.7 K/uL   Lymphocytes Relative 13 %   Lymphs Abs 1.1 0.7 - 4.0 K/uL   Monocytes Relative 4 %   Monocytes Absolute 0.4 0.1 - 1.0 K/uL   Eosinophils Relative 1 %   Eosinophils Absolute 0.0 0.0 - 0.5 K/uL   Basophils Relative 1 %   Basophils Absolute 0.0 0.0 - 0.1 K/uL   Immature Granulocytes 1 %   Abs Immature Granulocytes 0.04 0.00 - 0.07 K/uL  Comprehensive metabolic panel  Status: Abnormal   Collection Time: 12/12/23  3:41 PM  Result Value Ref Range   Sodium 136 135 - 145 mmol/L   Potassium 4.2 3.5 - 5.1 mmol/L   Chloride 101 98 - 111 mmol/L   CO2 22 22 - 32 mmol/L   Glucose, Bld 151 (H) 70 - 99 mg/dL   BUN 17 8 - 23 mg/dL   Creatinine, Ser 1.30 (H) 0.44 - 1.00 mg/dL   Calcium 9.8 8.9 - 86.5 mg/dL   Total Protein 7.1 6.5 - 8.1 g/dL   Albumin 4.0 3.5 - 5.0 g/dL   AST 46 (H) 15 - 41 U/L   ALT 23 0 - 44 U/L   Alkaline Phosphatase 111 38 - 126 U/L   Total Bilirubin 1.7 (H) 0.0 - 1.2 mg/dL   GFR, Estimated 46 (L) >60 mL/min   Anion gap 13 5 - 15  Lipase, blood     Status: None   Collection Time: 12/12/23  3:41 PM  Result Value Ref Range   Lipase 25 11 - 51 U/L  Culture, blood (Routine X 2) w Reflex to ID Panel     Status: None (Preliminary result)   Collection Time:  12/13/23  5:08 AM   Specimen: BLOOD  Result Value Ref Range   Specimen Description BLOOD BLOOD RIGHT ARM    Special Requests      BOTTLES DRAWN AEROBIC AND ANAEROBIC Blood Culture adequate volume   Culture      NO GROWTH < 12 HOURS Performed at [facility], [address], [place], [place] [address]    Report Status PENDING   Comprehensive metabolic panel     Status: Abnormal   Collection Time: 12/13/23  5:09 AM  Result Value Ref Range   Sodium 132 (L) 135 - 145 mmol/L   Potassium 4.3 3.5 - 5.1 mmol/L   Chloride 98 98 - 111 mmol/L   CO2 22 22 - 32 mmol/L   Glucose, Bld 141 (H) 70 - 99 mg/dL   BUN 17 8 - 23 mg/dL   Creatinine, Ser 6.29 (H) 0.44 - 1.00 mg/dL   Calcium 9.3 8.9 - 52.8 mg/dL   Total Protein 6.1 (L) 6.5 - 8.1 g/dL   Albumin 3.3 (L) 3.5 - 5.0 g/dL   AST 413 (H) 15 - 41 U/L   ALT 60 (H) 0 - 44 U/L   Alkaline Phosphatase 121 38 - 126 U/L   Total Bilirubin 3.7 (H) 0.0 - 1.2 mg/dL   GFR, Estimated 43 (L) >60 mL/min   Anion gap 12 5 - 15  CBC     Status: Abnormal   Collection Time: 12/13/23  5:09 AM  Result Value Ref Range   WBC 12.7 (H) 4.0 - 10.5 K/uL   RBC 3.57 (L) 3.87 - 5.11 MIL/uL   Hemoglobin 10.3 (L) 12.0 - 15.0 g/dL   HCT 24.4 (L) 01.0 - 27.2 %   MCV 91.0 80.0 - 100.0 fL   MCH 28.9 26.0 - 34.0 pg   MCHC 31.7 30.0 - 36.0 g/dL   RDW 53.6 64.4 - 03.4 %   Platelets 207 150 - 400 K/uL   nRBC 0.0 0.0 - 0.2 %  Culture, blood (Routine X 2) w Reflex to ID Panel     Status: None (Preliminary result)   Collection Time: 12/13/23  5:09 AM   Specimen: BLOOD  Result Value Ref Range   Specimen Description BLOOD BLOOD RIGHT ARM    Special Requests  AEROBIC BOTTLE ONLY Blood Culture results may not be optimal due to an inadequate volume of blood received in culture bottles   Culture      NO GROWTH < 12 HOURS Performed at [facility], [address] [place], [place] [address]    Report Status PENDING      US PELVIS (TRANSABDOMINAL ONLY) Result  Date: 12/13/2023 CLINICAL DATA:  [id number] Cyst of left ovary [id number] EXAM: TRANSABDOMINAL ULTRASOUND OF PELVIS TECHNIQUE: Transabdominal ultrasound examination of the pelvis was performed including evaluation of the uterus, ovaries, adnexal regions, and pelvic cul-de-sac. COMPARISON:  MRI abdomen from earlier the same day and CT scan abdomen and pelvis from yesterday 12/12/2023. FINDINGS: Transvaginal exam could not be performed as patient was unable to tolerate/positioned to do the transvaginal exam. Uterus Measurements: 2.1 x 2.8 x 7.4 cm = volume: 22.8 mL. Small/atrophic uterus exhibiting diffuse myometrial calcifications, within normal limits for patient's age. No fibroids or other mass visualized. Endometrium Thickness: Not well seen but measures less than 2 mm in thickness. No discrete focal abnormality visualized. Right ovary Not visualized.  No large adnexal mass seen. Left ovary Not visualized. No large adnexal mass seen. Other findings:  No abnormal free fluid. IMPRESSION: 1. Limited transabdominal exam. 2. Bilateral ovaries are not visualized. No large adnexal mass seen on the provided images. 3. Small/atrophic uterus exhibiting myometrial calcifications, within normal limits for patient's age. No focal mass. Electronically Signed   By: Jules Schick M.D.   On: 12/13/2023 08:42   MR ABDOMEN MRCP W WO CONTAST Result Date: 12/13/2023 CLINICAL DATA:  88 year old female with history of acute cholecystitis. Biliary tract dilatation noted on prior CT examination. EXAM: MRI ABDOMEN WITHOUT AND WITH CONTRAST (INCLUDING MRCP) TECHNIQUE: Multiplanar multisequence MR imaging of the abdomen was performed both before and after the administration of intravenous contrast. Heavily T2-weighted images of the biliary and pancreatic ducts were obtained, and three-dimensional MRCP images were rendered by post processing. CONTRAST:  8mL GADAVIST GADOBUTROL 1 MMOL/ML IV SOLN COMPARISON:  None Available. FINDINGS: Comment:  Portions of today's examination are limited by substantial patient respiratory motion. Lower chest: Cardiomegaly with biatrial dilatation. Trace bilateral pleural effusions. Hepatobiliary: No definite suspicious cystic or solid hepatic lesions. Gallbladder is severely distended. Amorphous T2 hypointense material lying dependently in the gallbladder likely reflects biliary sludge and/or tiny gallstones. Gallbladder wall appears thickened and edematous with trace volume of pericholecystic fluid. MRCP images are severely limited by patient respiratory motion, essentially nondiagnostic. With this limitation in mind, no definite intra or extrahepatic biliary ductal dilatation is noted on today's examination. Pancreas: No pancreatic mass. No definite pancreatic ductal dilatation noted. No peripancreatic fluid collections or inflammatory changes. Spleen:  Unremarkable. Adrenals/Urinary Tract: T1 hypointense, T2 hyperintense, nonenhancing lesions in the right kidney measuring up to 2.6 cm in the upper pole, compatible with simple (Bosniak class 1) cysts (no imaging follow-up recommended). No hydroureteronephrosis in the visualized portions of the abdomen. Bilateral adrenal glands are normal in appearance. Stomach/Bowel: Visualized portions are unremarkable. Vascular/Lymphatic: No aneurysm identified in the visualized abdominal vasculature. No lymphadenopathy noted in the abdomen. Other: No significant volume of ascites noted in the visualized portions of the peritoneal cavity. Musculoskeletal: No aggressive appearing osseous lesions are noted in the visualized portions of the skeleton. IMPRESSION: 1. Study is severely limited by patient respiratory motion. Despite the limitations of today's examination, the findings are highly concerning for acute cholecystitis. Surgical consultation is recommended. 2. No definite choledocholithiasis or findings of biliary tract obstruction. 3.  Cardiomegaly with biatrial dilatation.  Electronically Signed   By: Trudie Reed M.D.   On: 12/13/2023 06:40   CT ABDOMEN PELVIS W CONTRAST Result Date: 12/12/2023 CLINICAL DATA:  Abdominal pain, acute (Ped 0-17y). Right upper quadrant abdominal pain EXAM: CT ABDOMEN AND PELVIS WITH CONTRAST TECHNIQUE: Multidetector CT imaging of the abdomen and pelvis was performed using the standard protocol following bolus administration of intravenous contrast. RADIATION DOSE REDUCTION: This exam was performed according to the departmental dose-optimization program which includes automated exposure control, adjustment of the mA and/or kV according to patient size and/or use of iterative reconstruction technique. CONTRAST:  75mL OMNIPAQUE IOHEXOL 350 MG/ML SOLN COMPARISON:  Sonogram 3:58 p.m. FINDINGS: Lower chest: Mild interstitial pulmonary edema. Mild global cardiomegaly with particular right atrial enlargement. Hepatobiliary: Cholelithiasis noted. The gallbladder is dilated and there is pericholecystic inflammatory stranding and trace free fluid in keeping with changes of acute calculus cholecystitis. There is mild intra and extrahepatic biliary ductal dilation to the level of the ampulla, however, a distal obstructing lesion or intraluminal stone is not identified on this limited examination. The liver is otherwise unremarkable. Pancreas: Atrophic but otherwise unremarkable. Spleen: Unremarkable Adrenals/Urinary Tract: The adrenal glands are unremarkable. The kidneys are normal in position. Moderate bilateral renal cortical atrophy with asymmetric cortical scarring involving the upper pole the right kidney. No enhancing intrarenal mass. Multiple simple cortical cysts are seen within the right kidney for which no follow-up imaging is recommended. No hydronephrosis. No intrarenal or ureteral calculi. The bladder is unremarkable. Stomach/Bowel: Severe sigmoid diverticulosis. The stomach, small bowel, and large bowel are otherwise unremarkable. Appendix normal.  No free intraperitoneal gas or fluid. Vascular/Lymphatic: Extensive aortoiliac atherosclerotic calcification. No aortic aneurysm. Shotty periportal adenopathy is nonspecific, possibly reactive in nature. No frankly pathologic adenopathy within the abdomen and pelvis. Reproductive: The left ovary is replaced with a heterogeneously enhancing solid lobulated mass measuring 2.6 x 4.4 cm, stable since prior exam when measured in similar fashion. Uterus and right adnexa are unremarkable. Other: No abdominal wall hernia Musculoskeletal: Osseous structures are age-appropriate. Remote T12 compression deformity is unchanged without associated retrolisthesis or retropulsion. No acute bone abnormality. IMPRESSION: 1. Acute calculus cholecystitis. 2. Mild intra and extrahepatic biliary ductal dilation to the level of the ampulla, however, a distal obstructing lesion or intraluminal stone is not identified on this limited examination. Correlation with liver function tests may be helpful. If indicated, this could be further assessed with MRCP or intraoperative cholangiography. 3. Severe sigmoid diverticulosis. 4. Stable 4.4 cm solid lobulated mass replacing the left ovary. While this may represent a benign ovarian neoplasm, gynecologic consultation is recommended for further management. 5. Mild global cardiomegaly with particular right atrial enlargement. Mild interstitial pulmonary edema. Aortic Atherosclerosis (ICD10-I70.0). Electronically Signed   By: Helyn Numbers M.D.   On: 12/12/2023 20:08   US Abdomen Limited RUQ (LIVER/GB) Result Date: 12/12/2023 CLINICAL DATA:  Right upper quadrant abdominal pain EXAM: ULTRASOUND ABDOMEN LIMITED RIGHT UPPER QUADRANT COMPARISON:  CT 08/17/2022. FINDINGS: Gallbladder: Dilated gallbladder. Slight wall thickening of 4 mm. Layering small stones. No adjacent fluid. No reported sonographic Murphy's sign. Common bile duct: Diameter: Common duct upper 9 mm, upper limits of normal for the  patient's the patient's age. Liver: No focal lesion identified. Within normal limits in parenchymal echogenicity. Portal vein is patent on color Doppler imaging with normal direction of blood flow towards the liver. Other: Note is made of a right-sided renal cyst which appears simple measuring up to 2.7 cm. Small right-sided pleural effusion.  IMPRESSION: Dilated gallbladder with layering stones. Slight wall thickening. Please correlate for other clinical findings of acute cholecystitis and if confirmatory study is needed, HIDA scan may be of some benefit. Common duct at 9 mm, upper limits of normal for the the patient's age. Small right-sided pleural effusion. Electronically Signed   By: Karen Kays M.D.   On: 12/12/2023 16:31    ROS:  As stated above in the HPI otherwise negative.  Blood pressure (!) 121/48, pulse (!) 58, temperature 99.1 F (37.3 C), temperature source Oral, resp. rate 19, SpO2 93%.    PE: Gen: NAD, Alert and Oriented HEENT:  [place]/AT, EOMI Lungs: CTA Bilaterally CV: Irreg, irreg ABD: Soft, mild tenderness in the RUQ, +BS Ext: No C/C/E  Assessment/Plan: 1) Acute cholecystitis. 2) ? Choledocholithiasis. 3) elevated liver enzymes.   The elevation in her liver enzymes can be from an acute cholecystitis, however, it is not clear if she does have CBD stone(s).  The image quality was poor.  She is currently stable and afebrile.  Her WBC is mildly elevated, but there is no current suggestion of an ascending cholangitis.  Plan: 1) Maintain NPO status. 2) Follow liver enzymes.  If the blood work in the AM shows an increasingly obstructive pattern, an EUS/ERCP will be performed. 3) Continue with antibiotics.  Donevin Sainsbury D 12/13/2023, 10:50 AM

## 2023-12-13 NOTE — Progress Notes (Signed)
Anna [name]   NOTE [name] [name]:[id number]  DOB: 20-Dec-1935  DOA: 12/12/2023  PCP: Anna [name], Anna [name],Anna AM   LOS: 0 days  Code Status: Full code From: Home  current Dispo: Probably home   88 year old white female with known chronic a flutter CHA2DS2-VASc >5 on Eliquis status post cardioversion 2015 Mitral regurg and stenosis HTN HLD, prediabetes, CKD 3,  lumbar radicular pain 2/2 vertebral fracture pain management Dr. Joselyn Glassman HFrEF improved EF 55-60%  Previously admitted 10/21--08/24/2022 multifocal pneumonia and then stress cardiomyopathy NSTEMI with resulting acute HFrEF/Takotsubo cardiomyopathy-EF 25%-at that hospital stay Also noted have mediastinal right hilar lymphadenopathy, and subsequently followed in cardiology office last on 07/09/2023 has been on GDMT Coreg Farxiga Entresto Aldactone Eliquis Last echocardiogram EF 55-60% normal function indeterminant diastolic pattern  2/14 [facility] eval right upper quadrant pain, retention of urine poor urine output Hypertensive blood pressure 200s systolic heart rate 50-60 Sodium 136 potassium 0.2 AST 46 ALT bilirubin 1.7--WBC 7 hemoglobin 11.3 platelet 220 Blood cultures obtained RUQ [place] = dilated gallbladder with layering stones slight wall thickening bile duct upper limits normal 9 mm CT ABD pelvis acute calculus cholecystitis mild intra and extrahepatic duct dilatation no distal obstructing lesion found Stable 4.4 lobulated mass replacing left ovary--concern for possible GYN malignancy MRCP limited by respiratory motion-imaging still concerning for acute cholecystitis  General Surgery consulted, recommending cardiology clearance and possible need for surgery GI Dr. Elnoria Howard consulted?  Need for ERCP/lap chole GYN consulted for left ovarian mass --- they performed transvaginal ultrasound 2/15 showing poor visualization bilateral ovaries no large adnexal mass    Eliquis held on admission, antibiotics ceftriaxone Flagyl  started Became hypotensive given a bolus of fluids in the emergency room additionally    Plan  Sepsis on admission acute cholecystitis?  Choledocholithiasis with upper range CBD General Surgery aware of patient and requesting input from cardiology prior to clearance for surgery--- GI also aware of patient with regards to?  ERCP need LR 75 cc/H, n.p.o. Norco 1 tab Q4 as needed pain, severe pain morphine 1 mg every 2 as needed GDMT for cardiac needs on hold given hypotension subsequent to admission Continue ceftriaxone Flagyl intra-abdominal coverage Zofran for nausea Chronic A-fib CHADVASCs is 5--on Eliquis status post cardioversion 2015 Slow ventricular response A-fib Eliquis held on admission--- will need to initiate weight-based heparin if no surgery tomorrow once Eliquis washes out given elevated CHADS2 score Resume Coreg 3.125 when able Monitor on telemetry and if rate becomes uncontrolled may need cardiology assistance with regards to management HTN mitral valve regurg HF improved EF 55-60% Hypotension from likely sepsis from cholecystitis as above Holding at this time Entresto 1 tab twice daily Aldactone 12.5 daily Lasix 20H as needed from home Holding [place] for now Careful volume repletion strategy with IV fluid as above CKD 3 AA Fluids as above-creatinine seems to be at baseline Left ovarian mass lobulated on left ovary Discussed with Dr. Constance Goltz gynecology Transvaginal ultrasound performed which was nondiagnostic Follow Ca1 2 5 General Surgery and gynecology to determine together if they can visualize the ovaries at time of operation if patient goes for lap chole Lumbar radicular pain with vertebral pain chronically follows with Dr. Joselyn Glassman at [facility] medical Continue Flexeril 5 mg twice daily   Discussed with granddaughter Anna [name] on telephone giving best update as possible with regards to planning- cleared for surgery by cardiology-d general surgery will coordinate this  discussion with family I have also explained to them the implication  of ovarian mass and possibility of this being cancer-I have explained that operations sometimes have risk of seeding but it does not seem that there is spread outside of the ovary We will watch and see how she does over the next several days   DVT prophylaxis: SCD  Status is: Inpatient Remains inpatient appropriate because:   May require surgery and multiple consultant input as above  Subjective: She is awake pleasant coherent tells me abdominal pain is better She has no overt fever She thinks that the muscle relaxant helped her most Currently she is n.p.o.  Objective + exam Vitals:   12/13/23 0625 12/13/23 0630 12/13/23 0645 12/13/23 0700  BP:  (!) 121/41 (!) 123/52 (!) 125/44  Pulse:  (!) 52 63 (!) 56  Resp:  19 20 18   Temp: 99.1 F (37.3 C)     TempSrc: Oral     SpO2:  94% 100% 95%   There were no vitals filed for this visit.  Examination: Awake coherent no distress S1-S2 seems to be sinus, A-fib with slow ventricular response Chest is clear no wheeze rales rhonchi No lower extremity edema Abdomen is tender in right upper quadrant I do not appreciate any JVD Neuro is intact she is moving 4 limbs equally without deficit I do not appreciate gross edema in lower extremities  Data Reviewed: reviewed   CBC    Component Value Date/Time   WBC 12.7 (H) 12/13/2023 0509   RBC 3.57 (L) 12/13/2023 0509   HGB 10.3 (L) 12/13/2023 0509   HGB 11.4 10/02/2023 1639   HCT 32.5 (L) 12/13/2023 0509   HCT 36.1 10/02/2023 1639   PLT 207 12/13/2023 0509   PLT 268 10/02/2023 1639   MCV 91.0 12/13/2023 0509   MCV 90 10/02/2023 1639   MCH 28.9 12/13/2023 0509   MCHC 31.7 12/13/2023 0509   RDW 13.6 12/13/2023 0509   RDW 12.7 10/02/2023 1639   LYMPHSABS 1.1 12/12/2023 1541   LYMPHSABS 1.9 12/18/2016 1452   MONOABS 0.4 12/12/2023 1541   EOSABS 0.0 12/12/2023 1541   EOSABS 0.2 12/18/2016 1452   BASOSABS 0.0  12/12/2023 1541   BASOSABS 0.1 12/18/2016 1452      Latest Ref Rng & Units [name]    5:09 AM 12/12/2023    3:41 PM 10/02/2023    4:39 PM  CMP  Glucose 70 - 99 mg/dL 161  096  045   BUN 8 - 23 mg/dL 17  17  24    Creatinine 0.44 - 1.00 mg/dL 4.09  8.11  9.14   Sodium 135 - 145 mmol/L 132  136  137   Potassium 3.5 - 5.1 mmol/L 4.3  4.2  4.5   Chloride 98 - 111 mmol/L 98  101  99   CO2 22 - 32 mmol/L 22  22  21    Calcium 8.9 - 10.3 mg/dL 9.3  9.8  78.2   Total Protein 6.5 - 8.1 g/dL 6.1  7.1  6.7   Total Bilirubin 0.0 - 1.2 mg/dL 3.7  1.7  0.5   Alkaline Phos 38 - 126 U/L 121  111  100   AST 15 - 41 U/L 141  46  20   ALT 0 - 44 U/L 60  23  7     Scheduled Meds:  carvedilol  3.125 mg Oral BID WC   diclofenac Sodium  2 g Topical QID   ferrous sulfate  325 mg Oral Q breakfast   rosuvastatin  10 mg Oral  Once per day on Monday Wednesday Friday Saturday   sodium chloride flush  3 mL Intravenous Q12H   sodium chloride flush  3 mL Intravenous Q12H   Continuous Infusions:  sodium chloride     cefTRIAXone (ROCEPHIN)  IV     lactated ringers     metronidazole 500 mg (12/13/23 0450)    Time 46  Rhetta Mura, Anna  [facility]

## 2023-12-13 NOTE — Consult Note (Addendum)
Reason for Consult/Chief Complaint: acute cholecystitis Consultant: Pilar Plate, MD  Anna [name] is an 88 y.o. female.   HPI: 53F with acute onset abdominal pain that began 12/12/23 and requested her daughter Jonathon Resides) call EMS. Denies n/v. Last BM 2/13. No prior abdominal surgery.   Past Medical History:  Diagnosis Date   Hyperlipidemia    Hypertension    Mitral regurgitation    mild to moderate by echo 04/2022   Mitral stenosis    mild to moderate by echo 09/2022   Osteoarthritis    Overweight(278.02)    Permanent atrial fibrillation [facility])     Past Surgical History:  Procedure Laterality Date   CARDIOVERSION N/A 03/24/2014   Procedure: CARDIOVERSION;  Surgeon: Quintella Reichert, MD;  [facility] ENDOS[facility];  Service: Cardiovascular;  Laterality: N/A;   LEFT HEART CATH AND CORONARY ANGIOGRAPHY N/A 08/20/2022   Procedure: LEFT HEART CATH AND CORONARY ANGIOGRAPHY;  Surgeon: Corky Crafts, MD;  Location: [facility] INVASIVE CV LAB;  Service: Cardiovascular;  Laterality: N/A;    Family History  Problem Relation Age of Onset   Alzheimer's disease Mother     Social History:  reports that she has never smoked. She has never used smokeless tobacco. She reports that she does not drink alcohol and does not use drugs.  Allergies: No Known Allergies  Medications: I have reviewed the patient's current medications.  Results for orders placed or performed during the hospital encounter of 12/12/23 (from the past 48 hours)  CBC with Differential     Status: Abnormal   Collection Time: 12/12/23  3:41 PM  Result Value Ref Range   WBC 7.9 4.0 - 10.5 K/uL   RBC 3.93 3.87 - 5.11 MIL/uL   Hemoglobin 11.3 (L) 12.0 - 15.0 g/dL   HCT 86.5 (L) 78.4 - 69.6 %   MCV 90.8 80.0 - 100.0 fL   MCH 28.8 26.0 - 34.0 pg   MCHC 31.7 30.0 - 36.0 g/dL   RDW 29.5 28.4 - 13.2 %   Platelets 220 150 - 400 K/uL   nRBC 0.0 0.0 - 0.2 %   Neutrophils Relative % 80 %   Neutro Abs 6.4 1.7 - 7.7 K/uL    Lymphocytes Relative 13 %   Lymphs Abs 1.1 0.7 - 4.0 K/uL   Monocytes Relative 4 %   Monocytes Absolute 0.4 0.1 - 1.0 K/uL   Eosinophils Relative 1 %   Eosinophils Absolute 0.0 0.0 - 0.5 K/uL   Basophils Relative 1 %   Basophils Absolute 0.0 0.0 - 0.1 K/uL   Immature Granulocytes 1 %   Abs Immature Granulocytes 0.04 0.00 - 0.07 K/uL    Comment: Performed at [facility], [address], [place], [place] [address]  Comprehensive metabolic panel     Status: Abnormal   Collection Time: 12/12/23  3:41 PM  Result Value Ref Range   Sodium 136 135 - 145 mmol/L   Potassium 4.2 3.5 - 5.1 mmol/L   Chloride 101 98 - 111 mmol/L   CO2 22 22 - 32 mmol/L   Glucose, Bld 151 (H) 70 - 99 mg/dL    Comment: Glucose reference range applies only to samples taken after fasting for at least 8 hours.   BUN 17 8 - 23 mg/dL   Creatinine, Ser 2.72 (H) 0.44 - 1.00 mg/dL   Calcium 9.8 8.9 - 53.6 mg/dL   Total Protein 7.1 6.5 - 8.1 g/dL   Albumin 4.0 3.5 - 5.0 g/dL  AST 46 (H) 15 - 41 U/L   ALT 23 0 - 44 U/L   Alkaline Phosphatase 111 38 - 126 U/L   Total Bilirubin 1.7 (H) 0.0 - 1.2 mg/dL   GFR, Estimated 46 (L) >60 mL/min    Comment: (NOTE) Calculated using the CKD-EPI Creatinine Equation (2021)    Anion gap 13 5 - 15    Comment: Performed at [facility], [address], [place], [place] [address]  Lipase, blood     Status: None   Collection Time: 12/12/23  3:41 PM  Result Value Ref Range   Lipase 25 11 - 51 U/L    Comment: Performed at [facility], [address] [place], [place] [address]  Comprehensive metabolic panel     Status: Abnormal   Collection Time: 12/13/23  5:09 AM  Result Value Ref Range   Sodium 132 (L) 135 - 145 mmol/L   Potassium 4.3 3.5 - 5.1 mmol/L   Chloride 98 98 - 111 mmol/L   CO2 22 22 - 32 mmol/L   Glucose, Bld 141 (H) 70 - 99 mg/dL    Comment: Glucose reference range applies only to samples taken after fasting for at least 8 hours.   BUN 17 8 - 23  mg/dL   Creatinine, Ser 0.98 (H) 0.44 - 1.00 mg/dL   Calcium 9.3 8.9 - 11.9 mg/dL   Total Protein 6.1 (L) 6.5 - 8.1 g/dL   Albumin 3.3 (L) 3.5 - 5.0 g/dL   AST 147 (H) 15 - 41 U/L   ALT 60 (H) 0 - 44 U/L   Alkaline Phosphatase 121 38 - 126 U/L   Total Bilirubin 3.7 (H) 0.0 - 1.2 mg/dL   GFR, Estimated 43 (L) >60 mL/min    Comment: (NOTE) Calculated using the CKD-EPI Creatinine Equation (2021)    Anion gap 12 5 - 15    Comment: Performed at [facility], [address], [place], [place] [address]  CBC     Status: Abnormal   Collection Time: 12/13/23  5:09 AM  Result Value Ref Range   WBC 12.7 (H) 4.0 - 10.5 K/uL   RBC 3.57 (L) 3.87 - 5.11 MIL/uL   Hemoglobin 10.3 (L) 12.0 - 15.0 g/dL   HCT 21.3 (L) 08.6 - 57.8 %   MCV 91.0 80.0 - 100.0 fL   MCH 28.9 26.0 - 34.0 pg   MCHC 31.7 30.0 - 36.0 g/dL   RDW 46.9 62.9 - 52.8 %   Platelets 207 150 - 400 K/uL   nRBC 0.0 0.0 - 0.2 %    Comment: Performed at [facility], [address] [place], [place] [address]    CT ABDOMEN PELVIS W CONTRAST Result Date: 12/12/2023 CLINICAL DATA:  Abdominal pain, acute (Ped 0-17y). Right upper quadrant abdominal pain EXAM: CT ABDOMEN AND PELVIS WITH CONTRAST TECHNIQUE: Multidetector CT imaging of the abdomen and pelvis was performed using the standard protocol following bolus administration of intravenous contrast. RADIATION DOSE REDUCTION: This exam was performed according to the departmental dose-optimization program which includes automated exposure control, adjustment of the mA and/or kV according to patient size and/or use of iterative reconstruction technique. CONTRAST:  75mL OMNIPAQUE IOHEXOL 350 MG/ML SOLN COMPARISON:  Sonogram 3:58 p.m. FINDINGS: Lower chest: Mild interstitial pulmonary edema. Mild global cardiomegaly with particular right atrial enlargement. Hepatobiliary: Cholelithiasis noted. The gallbladder is dilated and there is pericholecystic inflammatory stranding and trace free  fluid in keeping with changes of  acute calculus cholecystitis. There is mild intra and extrahepatic biliary ductal dilation to the level of the ampulla, however, a distal obstructing lesion or intraluminal stone is not identified on this limited examination. The liver is otherwise unremarkable. Pancreas: Atrophic but otherwise unremarkable. Spleen: Unremarkable Adrenals/Urinary Tract: The adrenal glands are unremarkable. The kidneys are normal in position. Moderate bilateral renal cortical atrophy with asymmetric cortical scarring involving the upper pole the right kidney. No enhancing intrarenal mass. Multiple simple cortical cysts are seen within the right kidney for which no follow-up imaging is recommended. No hydronephrosis. No intrarenal or ureteral calculi. The bladder is unremarkable. Stomach/Bowel: Severe sigmoid diverticulosis. The stomach, small bowel, and large bowel are otherwise unremarkable. Appendix normal. No free intraperitoneal gas or fluid. Vascular/Lymphatic: Extensive aortoiliac atherosclerotic calcification. No aortic aneurysm. Shotty periportal adenopathy is nonspecific, possibly reactive in nature. No frankly pathologic adenopathy within the abdomen and pelvis. Reproductive: The left ovary is replaced with a heterogeneously enhancing solid lobulated mass measuring 2.6 x 4.4 cm, stable since prior exam when measured in similar fashion. Uterus and right adnexa are unremarkable. Other: No abdominal wall hernia Musculoskeletal: Osseous structures are age-appropriate. Remote T12 compression deformity is unchanged without associated retrolisthesis or retropulsion. No acute bone abnormality. IMPRESSION: 1. Acute calculus cholecystitis. 2. Mild intra and extrahepatic biliary ductal dilation to the level of the ampulla, however, a distal obstructing lesion or intraluminal stone is not identified on this limited examination. Correlation with liver function tests may be helpful. If indicated, this  could be further assessed with MRCP or intraoperative cholangiography. 3. Severe sigmoid diverticulosis. 4. Stable 4.4 cm solid lobulated mass replacing the left ovary. While this may represent a benign ovarian neoplasm, gynecologic consultation is recommended for further management. 5. Mild global cardiomegaly with particular right atrial enlargement. Mild interstitial pulmonary edema. Aortic Atherosclerosis (ICD10-I70.0). Electronically Signed   By: Helyn Numbers M.D.   On: 12/12/2023 20:08   US Abdomen Limited RUQ (LIVER/GB) Result Date: 12/12/2023 CLINICAL DATA:  Right upper quadrant abdominal pain EXAM: ULTRASOUND ABDOMEN LIMITED RIGHT UPPER QUADRANT COMPARISON:  CT 08/17/2022. FINDINGS: Gallbladder: Dilated gallbladder. Slight wall thickening of 4 mm. Layering small stones. No adjacent fluid. No reported sonographic Murphy's sign. Common bile duct: Diameter: Common duct upper 9 mm, upper limits of normal for the patient's the patient's age. Liver: No focal lesion identified. Within normal limits in parenchymal echogenicity. Portal vein is patent on color Doppler imaging with normal direction of blood flow towards the liver. Other: Note is made of a right-sided renal cyst which appears simple measuring up to 2.7 cm. Small right-sided pleural effusion. IMPRESSION: Dilated gallbladder with layering stones. Slight wall thickening. Please correlate for other clinical findings of acute cholecystitis and if confirmatory study is needed, HIDA scan may be of some benefit. Common duct at 9 mm, upper limits of normal for the the patient's age. Small right-sided pleural effusion. Electronically Signed   By: Karen Kays M.D.   On: 12/12/2023 16:31    ROS 10 point review of systems is negative except as listed above in HPI.   Physical Exam Blood pressure (!) 120/48, pulse (!) 55, temperature 99.1 F (37.3 C), temperature source Oral, resp. rate 18, SpO2 94%. Constitutional: well-developed,  well-nourished HEENT: pupils equal, round, reactive to light, 2mm b/l, moist conjunctiva, external inspection of ears and nose normal, hearing intact Oropharynx: normal oropharyngeal mucosa, poor dentition Neck: no thyromegaly, trachea midline, no midline cervical tenderness to palpation Chest: breath sounds equal bilaterally, normal respiratory effort, no midline  or lateral chest wall tenderness to palpation/deformity Abdomen: soft, NT, no bruising, no hepatosplenomegaly Extremities: 2+ radial and pedal pulses bilaterally, intact motor and sensation bilateral UE and LE, no peripheral edema Skin: warm, dry, no rashes Psych: normal memory, normal mood/affect     Assessment/Plan: Acute cholecystitis, probable choledocholithiasis - MRI with significant motion artifact, but Tbili 3.7. Appreciate GI recs. Will need to d/w patient and family risks and benefits of surgery as she is higher than standard surgical risk. Would recommend cardiology consultation for risk stratification and possible optimization. Continue abx. If operative risk deemed prohibitive or is patient declines surgery, would recommend IR cholecystostomy tube placement and biliary decompression, though this would not be considered a standard treatment strategy. Would also recommend palliative consultation pre-operatively to discuss goals of care and fill out MOST form.   FEN - CLD DVT - SCDs, LMWH Dispo -  per primary     Diamantina Monks, MD General and Trauma Surgery [facility] Surgery

## 2023-12-13 NOTE — ED Provider Notes (Signed)
[facility]PT [facility] Emergency Department Provider Note MRN:  [id number]  Arrival date & time: 12/13/23     Chief Complaint   Abdominal Pain   History of Present Illness   Anna [name] is a 88 y.o. year-old female with a history of A-fib presenting to the ED with chief complaint of abdominal pain.  Right upper quadrant abdominal pain for the past few days, not going away, moderate to severe, denies fever.  Review of Systems  A thorough review of systems was obtained and all systems are negative except as noted in the HPI and PMH.   Patient's Health History    Past Medical History:  Diagnosis Date   Hyperlipidemia    Hypertension    Mitral regurgitation    mild to moderate by echo 04/2022   Mitral stenosis    mild to moderate by echo 09/2022   Osteoarthritis    Overweight(278.02)    Permanent atrial fibrillation [facility])     Past Surgical History:  Procedure Laterality Date   CARDIOVERSION N/A 03/24/2014   Procedure: CARDIOVERSION;  Surgeon: Quintella Reichert, MD;  Location[facility] ENDOSCOPY;  Service: Cardiovascular;  Laterality: N/A;   LEFT HEART CATH AND CORONARY ANGIOGRAPHY N/A 08/20/2022   Procedure: LEFT HEART CATH AND CORONARY ANGIOGRAPHY;  Surgeon: Corky Crafts, MD;  Location: [facility] INVASIVE CV LAB;  Service: Cardiovascular;  Laterality: N/A;    Family History  Problem Relation Age of Onset   Alzheimer's disease Mother     Social History   Socioeconomic History   Marital status: Divorced    Spouse name: Not on file   Number of children: 3   Years of education: Not on file   Highest education level: Not on file  Occupational History   Occupation: retired  Tobacco Use   Smoking status: Never   Smokeless tobacco: Never  Vaping Use   Vaping status: Never Used  Substance and Sexual Activity   Alcohol use: No   Drug use: No   Sexual activity: Not on file  Other Topics Concern   Not on file  Social History Narrative   Not on file   Social  Drivers of Health   Financial Resource Strain: Low Risk  (02/21/2023)   Overall Financial Resource Strain (CARDIA)    Difficulty of Paying Living Expenses: Not hard at all  Food Insecurity: No Food Insecurity (02/21/2023)   Hunger Vital Sign    Worried About Running Out of Food in the Last Year: Never true    Ran Out of Food in the Last Year: Never true  Transportation Needs: No Transportation Needs (02/21/2023)   PRAPARE - Administrator, Civil Service (Medical): No    Lack of Transportation (Non-Medical): No  Physical Activity: Inactive (02/21/2023)   Exercise Vital Sign    Days of Exercise per Week: 0 days    Minutes of Exercise per Session: 0 min  Stress: No Stress Concern Present (02/21/2023)   [employer] of Occupational Health - Occupational Stress Questionnaire    Feeling of Stress : Not at all  Social Connections: Not on file  Intimate Partner Violence: Not At Risk (02/21/2023)   Humiliation, Afraid, Rape, and Kick questionnaire    Fear of Current or Ex-Partner: No    Emotionally Abused: No    Physically Abused: No    Sexually Abused: No     Physical Exam   Vitals:   12/13/23 0045 12/13/23 0205  BP:  (!) 130/53  Pulse:  Marland Kitchen)  58  Resp:  16  Temp: 98.4 F (36.9 C)   SpO2:  95%    CONSTITUTIONAL: Chronically ill appearing, NAD NEURO/PSYCH:  Alert and oriented x 3, no focal deficits EYES:  eyes equal and reactive ENT/NECK:  no LAD, no JVD CARDIO: Regular rate, well-perfused, normal S1 and S2 PULM:  CTAB no wheezing or rhonchi GI/GU: Moderate right upper quadrant tenderness to palpation MSK/SPINE:  No gross deformities, no edema SKIN:  no rash, atraumatic   *Additional and/or pertinent findings included in MDM below  Diagnostic and Interventional Summary    EKG Interpretation Date/Time:  Friday December 12 2023 15:51:43 EST Ventricular Rate:  50 PR Interval:    QRS Duration:  136 QT Interval:  444 QTC Calculation: 404 R Axis:   -67  Text  Interpretation: Atrial fibrillation with slow ventricular response Left axis deviation Non-specific intra-ventricular conduction block Minimal voltage criteria for LVH, may be normal variant ( Cornell product ) Possible Lateral infarct , age undetermined Abnormal ECG When compared with ECG of 04-Sep-2022 14:20, No significant change since last tracing Confirmed by Alvira Monday ([id number]) on 12/12/2023 3:54:27 PM       Labs Reviewed  CBC WITH DIFFERENTIAL/PLATELET - Abnormal; Notable for the following components:      Result Value   Hemoglobin 11.3 (*)    HCT 35.7 (*)    All other components within normal limits  COMPREHENSIVE METABOLIC PANEL - Abnormal; Notable for the following components:   Glucose, Bld 151 (*)    Creatinine, Ser 1.15 (*)    AST 46 (*)    Total Bilirubin 1.7 (*)    GFR, Estimated 46 (*)    All other components within normal limits  LIPASE, BLOOD  URINALYSIS, W/ REFLEX TO CULTURE (INFECTION SUSPECTED)    CT ABDOMEN PELVIS W CONTRAST  Final Result    US Abdomen Limited RUQ (LIVER/GB)  Final Result      Medications  azithromycin (ZITHROMAX) 500 mg in sodium chloride 0.9 % 250 mL IVPB (500 mg Intravenous New Bag/Given 12/13/23 0237)  fentaNYL (SUBLIMAZE) injection 25 mcg (25 mcg Intravenous Given 12/12/23 1548)  ondansetron (ZOFRAN) injection 4 mg (4 mg Intravenous Given 12/12/23 1548)  iohexol (OMNIPAQUE) 350 MG/ML injection 75 mL (75 mLs Intravenous Contrast Given 12/12/23 1836)  cefTRIAXone (ROCEPHIN) 2 g in sodium chloride 0.9 % 100 mL IVPB (2 g Intravenous New Bag/Given 12/13/23 0206)     Procedures  /  Critical Care .Critical Care  Performed by: Sabas Sous, MD Authorized by: Sabas Sous, MD   Critical care provider statement:    Critical care time (minutes):  35   Critical care was necessary to treat or prevent imminent or life-threatening deterioration of the following conditions: Acute cholecystitis.   Critical care was time spent personally by  me on the following activities:  Development of treatment plan with patient or surrogate, discussions with consultants, evaluation of patient's response to treatment, examination of patient, ordering and review of laboratory studies, ordering and review of radiographic studies, ordering and performing treatments and interventions, pulse oximetry, re-evaluation of patient's condition and review of old charts   ED Course and Medical Decision Making  Initial Impression and Ddx Differential diagnosis includes pancreatitis, biliary colic, cholecystitis, SBO, atypical presentation of ACS felt to be less likely  Past medical/surgical history that increases complexity of ED encounter: History of A-fib  Interpretation of Diagnostics I personally reviewed the EKG and my interpretation is as follows: A-fib  Labs reveal minimal LFT  elevation  Patient Reassessment and Ultimate Disposition/Management     CT concerning for cholecystitis.  Dr. Bedelia Person of general surgery consulted, recommending hospitalist admission.  Patient management required discussion with the following services or consulting groups:  Hospitalist Service and General/Trauma Surgery  Complexity of Problems Addressed Acute illness or injury that poses threat of life of bodily function  Additional Data Reviewed and Analyzed Further history obtained from: Prior labs/imaging results  Additional Factors Impacting ED Encounter Risk Consideration of hospitalization  Anna Sow. Pilar Plate, MD [facility] Emergency Medicine [facility] [email or web address]  Final Clinical Impressions(s) / ED Diagnoses     ICD-10-CM   1. Cholecystitis  K81.9       ED Discharge Orders     None        Discharge Instructions Discussed with and Provided to Patient:   Discharge Instructions   None      Sabas Sous, MD 12/13/23 [phone]

## 2023-12-13 NOTE — H&P (Addendum)
History and Physical    Anna [name] DOB: 1936/09/07 DOA: 12/12/2023  PCP: Marcine Matar, MD   Patient coming from: Home   Chief Complaint:  Chief Complaint  Patient presents with   Abdominal Pain   ED TRIAGE note:Pt BIB by EMS for RUQ pain and urinary retention. Reports last UOP yesterday and dark colored.Tenderness on exam. Family states pt had firmer bowel movements than normal but no constipation. Reports nausea but denies vomiting or diarrhea.   HPI:  Anna [name] is a 88 y.o. female with medical history significant of permanent atrial fibrillation, diastolic heart failure preserved EF 55 to 60%, prediabetes, CKD stage II, stress-induced cardiomyopathy, mitral regurgitation, aortic regurgitation, lumbar radiculopathy, osteoarthritis, history of vertebral fracture currently following pain management presented to emergency department complaining of right upper quadrant abdominal pain, poor urinary output and constipation. She denies any fever, chill, nausea, vomiting and diarrhea. Patient is complaining about right-sided abdominal pain pointing mostly towards the lower abdomen now. Patient does not have any other complaint at this time.  ED Course:  At presentation to ED patient found hypertensive blood pressure 206/92 which has been improved to 130/53, heart rate in between 50-60, respiratory rate 16-18 and O2 sat 95 to 98% room air.  CBC unremarkable stable H&H 11.3 and 35.7. CMP showing elevated blood glucose 151, elevated creatinine 1.15, elevated bilirubin 1.7, elevated AST 47. Normal lipase level 25. Pending UA.  CT abdomen pelvis showed: IMPRESSION: 1. Acute calculus cholecystitis. 2. Mild intra and extrahepatic biliary ductal dilation to the level of the ampulla, however, a distal obstructing lesion or intraluminal stone is not identified on this limited examination. Correlation with liver function tests may be helpful. If indicated, this  could be further assessed with MRCP or intraoperative cholangiography. 3. Severe sigmoid diverticulosis. 4. Stable 4.4 cm solid lobulated mass replacing the left ovary. While this may represent a benign ovarian neoplasm, gynecologic consultation is recommended for further management. 5. Mild global cardiomegaly with particular right atrial enlargement. Mild interstitial pulmonary edema.   Right upper quadrant ultrasound showed dilated gallbladder with layering of the stone.Slight wall thickening.  Please correlate for other clinical findings of acute cholecystitis and if confirmatory study is needed, HIDA scan may be of some benefit.  Common duct at 9 mm, upper limits of normal for the the patient's age.   Dr. Pilar Plate spoke with general surgery Dr. Bedelia Person recommended hospitalist as admission. In the ED patient has been treated with IV ceftriaxone and azithromycin.  Hospitalist has been contacted for further evaluation management of acute cholecystitis and abdominal pain.  Significant labs in the ED: Lab Orders         Culture, blood (Routine X 2) w Reflex to ID Panel         CBC with Differential         Comprehensive metabolic panel         Urinalysis, w/ Reflex to Culture (Infection Suspected) -Urine, Clean Catch         Lipase, blood         Comprehensive metabolic panel         CBC         CA 125       Review of Systems:  Review of Systems  Constitutional:  Negative for chills, fever and weight loss.  Cardiovascular:  Negative for chest pain and palpitations.  Gastrointestinal:  Positive for abdominal pain. Negative for constipation, diarrhea, heartburn, nausea and vomiting.  Genitourinary:  Negative for dysuria, flank pain, frequency and urgency.  Musculoskeletal:  Negative for back pain and myalgias.  Neurological:  Negative for dizziness.  Psychiatric/Behavioral:  The patient is not nervous/anxious.   All other systems reviewed and are negative.   Past Medical  History:  Diagnosis Date   Hyperlipidemia    Hypertension    Mitral regurgitation    mild to moderate by echo 04/2022   Mitral stenosis    mild to moderate by echo 09/2022   Osteoarthritis    Overweight(278.02)    Permanent atrial fibrillation [facility])     Past Surgical History:  Procedure Laterality Date   CARDIOVERSION N/A 03/24/2014   Procedure: CARDIOVERSION;  Surgeon: Quintella Reichert, MD;  Location: [facility] ENDOSCOPY;  Service: Cardiovascular;  Laterality: N/A;   LEFT HEART CATH AND CORONARY ANGIOGRAPHY N/A 08/20/2022   Procedure: LEFT HEART CATH AND CORONARY ANGIOGRAPHY;  Surgeon: Corky Crafts, MD;  Location: [facility] INVASIVE CV LAB;  Service: Cardiovascular;  Laterality: N/A;     reports that she has never smoked. She has never used smokeless tobacco. She reports that she does not drink alcohol and does not use drugs.  No Known Allergies  Family History  Problem Relation Age of Onset   Alzheimer's disease Mother     Prior to Admission medications   Medication Sig Start Date End Date Taking? Authorizing Provider  carvedilol (COREG) 3.125 MG tablet Take 1 tablet (3.125 mg total) by mouth 2 (two) times daily with a meal. 08/22/23   Marcine Matar, MD  Cholecalciferol (VITAMIN D3) 10 MCG (400 UNIT) tablet Take 2 tablets (800 Units total) by mouth daily. 08/26/22   Marcine Matar, MD  cyclobenzaprine (FLEXERIL) 10 MG tablet Take 0.5 tablets (5 mg total) by mouth 2 (two) times daily as needed for muscle spasms. 03/30/21   Marcine Matar, MD  dapagliflozin propanediol (FARXIGA) 10 MG TABS tablet TAKE 1 TABLET BY MOUTH EVERY DAY 10/24/23   Quintella Reichert, MD  diclofenac Sodium (VOLTAREN) 1 % GEL Apply 2 grams to knees 4 times a day as needed. 10/02/23   Marcine Matar, MD  docusate sodium (COLACE) 100 MG capsule Take 100 mg by mouth daily.     [provider]  ELIQUIS 5 MG TABS tablet TAKE 1 TABLET BY MOUTH TWICE A DAY 12/05/22   Quintella Reichert, MD  ferrous sulfate  325 (65 FE) MG tablet TAKE 1 TABLET BY MOUTH EVERY DAY WITH BREAKFAST 11/27/23   Marcine Matar, MD  furosemide (LASIX) 20 MG tablet Take 1 tablet (20 mg total) by mouth as needed. 10/06/23   Chandrasekhar, Rondel Jumbo, MD  HYDROcodone-acetaminophen (NORCO) 7.5-325 MG tablet Take 1 tablet by mouth 4 (four) times daily as needed. 09/05/23   [provider]  Misc. Devices MISC Electric Wheelchair 03/29/21   Marcine Matar, MD  Misc. Devices MISC Rollator walker 03/29/21   Marcine Matar, MD  Misc. Devices [facility] bed 03/29/21   Marcine Matar, MD  naloxone [facility]) nasal spray 4 mg/0.1 mL Place 1 spray into the nose once.    [provider]  Omega-3 Fatty Acids (FISH OIL CONCENTRATE PO) Take 1 capsule by mouth daily.     [provider]  rosuvastatin (CRESTOR) 10 MG tablet TAKE 1 TABLET BY MOUTH EVERY MONDAY, WEDNESDAY, FRIDAY, AND SATURDAY 12/08/23   Marcine Matar, MD  sacubitril-valsartan (ENTRESTO) 24-26 MG TAKE 1 TABLET BY MOUTH TWICE A DAY 10/24/23   Turner,  Cornelious Bryant, MD  spironolactone (ALDACTONE) 25 MG tablet TAKE 1/2 TABLET BY MOUTH EVERY DAY 10/24/23   Turner, Cornelious Bryant, MD  White Petrolatum-Mineral Oil (SYSTANE NIGHTTIME) OINT Place 1 drop into the left eye 4 (four) times daily.    [provider]     Physical Exam: Vitals:   12/13/23 0500 12/13/23 0615 12/13/23 0625 12/13/23 0630  BP:  (!) 120/48  (!) 121/41  Pulse: 63 (!) 55  (!) 52  Resp: 17 18  19   Temp:   99.1 F (37.3 C)   TempSrc:   Oral   SpO2: 94% 94%  94%    Physical Exam Vitals reviewed.  Constitutional:      Appearance: She is ill-appearing.  Cardiovascular:     Rate and Rhythm: Normal rate and regular rhythm.  Pulmonary:     Effort: Pulmonary effort is normal.     Breath sounds: Normal breath sounds.  Abdominal:     General: Bowel sounds are normal. There is no distension.     Palpations: Abdomen is soft. There is no shifting dullness, hepatomegaly or  splenomegaly.     Tenderness: There is abdominal tenderness in the right lower quadrant. There is no guarding or rebound.  Skin:    General: Skin is dry.     Capillary Refill: Capillary refill takes less than 2 seconds.  Neurological:     Mental Status: She is alert and oriented to person, place, and time.  Psychiatric:        Mood and Affect: Mood normal.      Labs on Admission: I have personally reviewed following labs and imaging studies  CBC: Recent Labs  Lab 12/12/23 1541 12/13/23 0509  WBC 7.9 12.7*  NEUTROABS 6.4  --   HGB 11.3* 10.3*  HCT 35.7* 32.5*  MCV 90.8 91.0  PLT 220 207   Basic Metabolic Panel: Recent Labs  Lab 12/12/23 1541 12/13/23 0509  NA 136 132*  K 4.2 4.3  CL 101 98  CO2 22 22  GLUCOSE 151* 141*  BUN 17 17  CREATININE 1.15* 1.23*  CALCIUM 9.8 9.3   GFR: CrCl cannot be calculated (Unknown ideal weight.). Liver Function Tests: Recent Labs  Lab 12/12/23 1541 12/13/23 0509  AST 46* 141*  ALT 23 60*  ALKPHOS 111 121  BILITOT 1.7* 3.7*  PROT 7.1 6.1*  ALBUMIN 4.0 3.3*   Recent Labs  Lab 12/12/23 1541  LIPASE 25   No results for input(s): "AMMONIA" in the last 168 hours. Coagulation Profile: No results for input(s): "INR", "PROTIME" in the last 168 hours. Cardiac Enzymes: No results for input(s): "CKTOTAL", "CKMB", "CKMBINDEX", "TROPONINI", "TROPONINIHS" in the last 168 hours. BNP (last 3 results) No results for input(s): "BNP" in the last 8760 hours. HbA1C: No results for input(s): "HGBA1C" in the last 72 hours. CBG: No results for input(s): "GLUCAP" in the last 168 hours. Lipid Profile: No results for input(s): "CHOL", "HDL", "LDLCALC", "TRIG", "CHOLHDL", "LDLDIRECT" in the last 72 hours. Thyroid Function Tests: No results for input(s): "TSH", "T4TOTAL", "FREET4", "T3FREE", "THYROIDAB" in the last 72 hours. Anemia Panel: No results for input(s): "VITAMINB12", "FOLATE", "FERRITIN", "TIBC", "IRON", "RETICCTPCT" in the last 72  hours. Urine analysis:    Component Value Date/Time   COLORURINE YELLOW 08/20/2022 1807   APPEARANCEUR HAZY (A) 08/20/2022 1807   LABSPEC 1.029 08/20/2022 1807   PHURINE 5.0 08/20/2022 1807   GLUCOSEU NEGATIVE 08/20/2022 1807   HGBUR NEGATIVE 08/20/2022 1807   BILIRUBINUR NEGATIVE 08/20/2022 1807  KETONESUR NEGATIVE 08/20/2022 1807   PROTEINUR NEGATIVE 08/20/2022 1807   NITRITE NEGATIVE 08/20/2022 1807   LEUKOCYTESUR NEGATIVE 08/20/2022 1807    Radiological Exams on Admission: I have personally reviewed images MR ABDOMEN MRCP W WO CONTAST Result Date: 12/13/2023 CLINICAL DATA:  88 year old female with history of acute cholecystitis. Biliary tract dilatation noted on prior CT examination. EXAM: MRI ABDOMEN WITHOUT AND WITH CONTRAST (INCLUDING MRCP) TECHNIQUE: Multiplanar multisequence MR imaging of the abdomen was performed both before and after the administration of intravenous contrast. Heavily T2-weighted images of the biliary and pancreatic ducts were obtained, and three-dimensional MRCP images were rendered by post processing. CONTRAST:  8mL GADAVIST GADOBUTROL 1 MMOL/ML IV SOLN COMPARISON:  None Available. FINDINGS: Comment: Portions of today's examination are limited by substantial patient respiratory motion. Lower chest: Cardiomegaly with biatrial dilatation. Trace bilateral pleural effusions. Hepatobiliary: No definite suspicious cystic or solid hepatic lesions. Gallbladder is severely distended. Amorphous T2 hypointense material lying dependently in the gallbladder likely reflects biliary sludge and/or tiny gallstones. Gallbladder wall appears thickened and edematous with trace volume of pericholecystic fluid. MRCP images are severely limited by patient respiratory motion, essentially nondiagnostic. With this limitation in mind, no definite intra or extrahepatic biliary ductal dilatation is noted on today's examination. Pancreas: No pancreatic mass. No definite pancreatic ductal  dilatation noted. No peripancreatic fluid collections or inflammatory changes. Spleen:  Unremarkable. Adrenals/Urinary Tract: T1 hypointense, T2 hyperintense, nonenhancing lesions in the right kidney measuring up to 2.6 cm in the upper pole, compatible with simple (Bosniak class 1) cysts (no imaging follow-up recommended). No hydroureteronephrosis in the visualized portions of the abdomen. Bilateral adrenal glands are normal in appearance. Stomach/Bowel: Visualized portions are unremarkable. Vascular/Lymphatic: No aneurysm identified in the visualized abdominal vasculature. No lymphadenopathy noted in the abdomen. Other: No significant volume of ascites noted in the visualized portions of the peritoneal cavity. Musculoskeletal: No aggressive appearing osseous lesions are noted in the visualized portions of the skeleton. IMPRESSION: 1. Study is severely limited by patient respiratory motion. Despite the limitations of today's examination, the findings are highly concerning for acute cholecystitis. Surgical consultation is recommended. 2. No definite choledocholithiasis or findings of biliary tract obstruction. 3. Cardiomegaly with biatrial dilatation. Electronically Signed   By: Trudie Reed M.D.   On: 12/13/2023 06:40   CT ABDOMEN PELVIS W CONTRAST Result Date: 12/12/2023 CLINICAL DATA:  Abdominal pain, acute (Ped 0-17y). Right upper quadrant abdominal pain EXAM: CT ABDOMEN AND PELVIS WITH CONTRAST TECHNIQUE: Multidetector CT imaging of the abdomen and pelvis was performed using the standard protocol following bolus administration of intravenous contrast. RADIATION DOSE REDUCTION: This exam was performed according to the departmental dose-optimization program which includes automated exposure control, adjustment of the mA and/or kV according to patient size and/or use of iterative reconstruction technique. CONTRAST:  75mL OMNIPAQUE IOHEXOL 350 MG/ML SOLN COMPARISON:  Sonogram 3:58 p.m. FINDINGS: Lower chest:  Mild interstitial pulmonary edema. Mild global cardiomegaly with particular right atrial enlargement. Hepatobiliary: Cholelithiasis noted. The gallbladder is dilated and there is pericholecystic inflammatory stranding and trace free fluid in keeping with changes of acute calculus cholecystitis. There is mild intra and extrahepatic biliary ductal dilation to the level of the ampulla, however, a distal obstructing lesion or intraluminal stone is not identified on this limited examination. The liver is otherwise unremarkable. Pancreas: Atrophic but otherwise unremarkable. Spleen: Unremarkable Adrenals/Urinary Tract: The adrenal glands are unremarkable. The kidneys are normal in position. Moderate bilateral renal cortical atrophy with asymmetric cortical scarring involving the upper pole the right  kidney. No enhancing intrarenal mass. Multiple simple cortical cysts are seen within the right kidney for which no follow-up imaging is recommended. No hydronephrosis. No intrarenal or ureteral calculi. The bladder is unremarkable. Stomach/Bowel: Severe sigmoid diverticulosis. The stomach, small bowel, and large bowel are otherwise unremarkable. Appendix normal. No free intraperitoneal gas or fluid. Vascular/Lymphatic: Extensive aortoiliac atherosclerotic calcification. No aortic aneurysm. Shotty periportal adenopathy is nonspecific, possibly reactive in nature. No frankly pathologic adenopathy within the abdomen and pelvis. Reproductive: The left ovary is replaced with a heterogeneously enhancing solid lobulated mass measuring 2.6 x 4.4 cm, stable since prior exam when measured in similar fashion. Uterus and right adnexa are unremarkable. Other: No abdominal wall hernia Musculoskeletal: Osseous structures are age-appropriate. Remote T12 compression deformity is unchanged without associated retrolisthesis or retropulsion. No acute bone abnormality. IMPRESSION: 1. Acute calculus cholecystitis. 2. Mild intra and extrahepatic  biliary ductal dilation to the level of the ampulla, however, a distal obstructing lesion or intraluminal stone is not identified on this limited examination. Correlation with liver function tests may be helpful. If indicated, this could be further assessed with MRCP or intraoperative cholangiography. 3. Severe sigmoid diverticulosis. 4. Stable 4.4 cm solid lobulated mass replacing the left ovary. While this may represent a benign ovarian neoplasm, gynecologic consultation is recommended for further management. 5. Mild global cardiomegaly with particular right atrial enlargement. Mild interstitial pulmonary edema. Aortic Atherosclerosis (ICD10-I70.0). Electronically Signed   By: Helyn Numbers M.D.   On: 12/12/2023 20:08   US Abdomen Limited RUQ (LIVER/GB) Result Date: 12/12/2023 CLINICAL DATA:  Right upper quadrant abdominal pain EXAM: ULTRASOUND ABDOMEN LIMITED RIGHT UPPER QUADRANT COMPARISON:  CT 08/17/2022. FINDINGS: Gallbladder: Dilated gallbladder. Slight wall thickening of 4 mm. Layering small stones. No adjacent fluid. No reported sonographic Murphy's sign. Common bile duct: Diameter: Common duct upper 9 mm, upper limits of normal for the patient's the patient's age. Liver: No focal lesion identified. Within normal limits in parenchymal echogenicity. Portal vein is patent on color Doppler imaging with normal direction of blood flow towards the liver. Other: Note is made of a right-sided renal cyst which appears simple measuring up to 2.7 cm. Small right-sided pleural effusion. IMPRESSION: Dilated gallbladder with layering stones. Slight wall thickening. Please correlate for other clinical findings of acute cholecystitis and if confirmatory study is needed, HIDA scan may be of some benefit. Common duct at 9 mm, upper limits of normal for the the patient's age. Small right-sided pleural effusion. Electronically Signed   By: Karen Kays M.D.   On: 12/12/2023 16:31     EKG: My personal interpretation of  EKG shows: EKG showing atrial fibrillation with slow ventricular response heart rate 50.   Assessment/Plan: Principal Problem:   Acute cholecystitis due to biliary calculus Active Problems:   Permanent atrial fibrillation (HCC)   Ovarian mass, left   Stage 3a chronic kidney disease (HCC)   Takotsubo cardiomyopathy   CKD (chronic kidney disease), stage II   Chronic diastolic CHF preserved EF 55 to 60% (congestive heart failure) (HCC)   Chronic pain syndrome    Assessment and Plan: Acute gallstone cholecystitis -Patient presenting with right upper quadrant abdominal pain for last few days which has been progressively getting worse.  Denies any nausea, vomiting, fever and chill.  Complaining about concentrated urine. - Initial presentation to ED patient is hemodynamically stable. -CMP showed elevated AST 47, elevated bilirubin 1.7.  Normal lipase level. - Right upper quadrant ultrasound showed distended gallbladder with layering stone.  Slight wall thickening.  Dilated common bile duct 9 mm. -CT abdomen pelvis showed acute calculus cholecystitis.  Mild intra next hepatic biliary dilation.  Recommended MRCP or intraoperative cholangiography. -ED physician Dr. Pilar Plate spoke with general surgery Dr.Lovick requested to admit patient under medicine service - In ED patient has been given ceftriaxone and azithromycin - Obtaining blood cultures. - Starting IV ceftriaxone 1 g daily and IV metronidazole 500 mg twice daily - Consulted [facility] Dr. Elnoria Howard will await for recommendation - Obtaining MRCP. -Continue pain control with Tylenol, Norco and morphine as needed for mild, moderate and severe pain. -Keeping patient n.p.o. - Holding oral Eliquis in case patient will undergo any surgical intervention like ERCP versus cholecystostomy tube placement. Addendum - MRCP showing acute cholecystitis.  No definite choledocholithiasis or biliary tract obstruction. -General Surgery Dr. Bedelia Person already has been  on board.   Left-sided ovarian mass CT abdomen pelvis showed incidental finding of 4.4 cm solid lobulated mass of the left ovary concern for benign versus ovarian neoplasm.  Radiology recommended gynecology consult. -CA125. - Consulted inpatient gynecology Dr. Charlotta Newton to evaluation in the daytime.  Permanent atrial fibrillation -Continue Coreg 3.125 mg twice daily - Holding Eliquis for possible intra-abdominal surgical intervention. -Continue cardiac monitoring  Diastolic heart failure preserved EF 55 to 60% History of Takotsubo cardiomyopathy -When patient first came to the ED blood pressure was in the high range went up to 201/116.  Currently patient is MAP at 63 and blood pressure is low 109/54. Giving 500 mL of LR bolus and continue LR 75 cc/h - Continue Coreg 3.125 mg twice daily. - Holding Entresto and Aldactone in the setting of hypotension. -Holding Jardiance as patient is NPO.  Prediabetic -Holding Jardiance since patient is n.p.o.  CKD stage II/IIIa -Creatinine around 1.02-1.2 and baseline GFR around 54-60..  Creatinine 1.15 today.  Continue to monitor renal function. -Pending UA. -Avoid nephrotoxic agent, continue to monitor urine output and renally adjust medications.  Chronic pain syndrome Chronic osteoarthritis of bilateral knee -Continue home Norco 4 times daily as needed.  DVT prophylaxis:  SCDs.  Can resume Eliquis after surgical intervention. Code Status:  Full Code Diet: Currently NPO.  Oral medication and ice chips are allowed Family Communication: No one present at bedside now Disposition Plan: Pending MRCP result.  Waiting for formal with general surgery, gastroenterology and gynecology evaluation recommendation.  Need to resume Eliquis once appropriate. Consults: General Surgery, gastroenterology and gynecology Admission status:   Inpatient, Telemetry bed  Severity of Illness: The appropriate patient status for this patient is INPATIENT. Inpatient status  is judged to be reasonable and necessary in order to provide the required intensity of service to ensure the patient's safety. The patient's presenting symptoms, physical exam findings, and initial radiographic and laboratory data in the context of their chronic comorbidities is felt to place them at high risk for further clinical deterioration. Furthermore, it is not anticipated that the patient will be medically stable for discharge from the hospital within 2 midnights of admission.   * I certify that at the point of admission it is my clinical judgment that the patient will require inpatient hospital care spanning beyond 2 midnights from the point of admission due to high intensity of service, high risk for further deterioration and high frequency of surveillance required.Marland Kitchen    Tereasa Coop, MD [facility]  How to contact the [facility] Attending or Consulting provider 7A - 7P or covering provider during after hours 7P -7A, for this patient.  Check the care team in  [facility] and look for a) attending/consulting [facility] provider listed and b) the [facility] team listed Log into [email or web address] and use [place]'s universal password to access. If you do not have the password, please contact the hospital operator. Locate the [facility] provider you are looking for under [facility] and page to a number that you can be directly reached. If you still have difficulty reaching the provider, please page the [facility] (Director on Call) for the Hospitalists listed on amion for assistance.  12/13/2023, 6:44 AM

## 2023-12-13 NOTE — Plan of Care (Signed)
  Problem: Health Behavior/Discharge Planning: Goal: Ability to manage health-related needs will improve Outcome: Progressing   Problem: Coping: Goal: Level of anxiety will decrease Outcome: Progressing   Problem: Elimination: Goal: Will not experience complications related to bowel motility Outcome: Progressing   

## 2023-12-13 NOTE — H&P (View-Only) (Signed)
Reason for Consult: ? Choledocholithiasis Referring Physician: [facility]  [name] HPI: This is an 88 year old female with a PMH of afib on anticoagulation,CKD, CHF with an EF of 55-60%, mitral regurg, aortic regurg, and osteoarthritis admitted for complaints of RUQ pain.  Imaging revealed that the patient has an acute cholecystitis and there was evidence of biliary ductal dilation with the CT scan.  An MRCP was performed, but the images were degraded secondary to her respirations.  There was no clear evidence of choledocholithiasis, but the image quality was poor.  The liver enzymes at the time of admission only showed a mild elevation in her AST at 46, but today they were more elevated:  AST 141, ALT 60, TB 3.7.  Currently she feels well and Cardiology cleared her for procedures and surgery.  Past Medical History:  Diagnosis Date   Hyperlipidemia    Hypertension    Mild CAD    Mitral regurgitation    trivial   Mitral stenosis    mild   Osteoarthritis    Overweight(278.02)    Permanent atrial fibrillation (HCC)    Takotsubo cardiomyopathy     Past Surgical History:  Procedure Laterality Date   CARDIOVERSION N/A 03/24/2014   Procedure: CARDIOVERSION;  Surgeon: Quintella Reichert, MD;  Location: [facility] ENDOSCOPY;  Service: Cardiovascular;  Laterality: N/A;   LEFT HEART CATH AND CORONARY ANGIOGRAPHY N/A 08/20/2022   Procedure: LEFT HEART CATH AND CORONARY ANGIOGRAPHY;  Surgeon: Corky Crafts, MD;  Location: [facility] INVASIVE CV LAB;  Service: Cardiovascular;  Laterality: N/A;    Family History  Problem Relation Age of Onset   Alzheimer's disease Mother     Social History:  reports that she has never smoked. She has never used smokeless tobacco. She reports that she does not drink alcohol and does not use drugs.  Allergies: No Known Allergies  Medications: Scheduled:  sodium chloride flush  3 mL Intravenous Q12H   sodium chloride flush  3 mL Intravenous Q12H    Continuous:  sodium chloride     cefTRIAXone (ROCEPHIN)  IV     lactated ringers 75 mL/hr at 12/13/23 0847   metronidazole Stopped (12/13/23 0735)    Results for orders placed or performed during the hospital encounter of 12/12/23 (from the past 24 hours)  CBC with Differential     Status: Abnormal   Collection Time: 12/12/23  3:41 PM  Result Value Ref Range   WBC 7.9 4.0 - 10.5 K/uL   RBC 3.93 3.87 - 5.11 MIL/uL   Hemoglobin 11.3 (L) 12.0 - 15.0 g/dL   HCT 16.1 (L) 09.6 - 04.5 %   MCV 90.8 80.0 - 100.0 fL   MCH 28.8 26.0 - 34.0 pg   MCHC 31.7 30.0 - 36.0 g/dL   RDW 40.9 81.1 - 91.4 %   Platelets 220 150 - 400 K/uL   nRBC 0.0 0.0 - 0.2 %   Neutrophils Relative % 80 %   Neutro Abs 6.4 1.7 - 7.7 K/uL   Lymphocytes Relative 13 %   Lymphs Abs 1.1 0.7 - 4.0 K/uL   Monocytes Relative 4 %   Monocytes Absolute 0.4 0.1 - 1.0 K/uL   Eosinophils Relative 1 %   Eosinophils Absolute 0.0 0.0 - 0.5 K/uL   Basophils Relative 1 %   Basophils Absolute 0.0 0.0 - 0.1 K/uL   Immature Granulocytes 1 %   Abs Immature Granulocytes 0.04 0.00 - 0.07 K/uL  Comprehensive metabolic panel  Status: Abnormal   Collection Time: 12/12/23  3:41 PM  Result Value Ref Range   Sodium 136 135 - 145 mmol/L   Potassium 4.2 3.5 - 5.1 mmol/L   Chloride 101 98 - 111 mmol/L   CO2 22 22 - 32 mmol/L   Glucose, Bld 151 (H) 70 - 99 mg/dL   BUN 17 8 - 23 mg/dL   Creatinine, Ser 1.30 (H) 0.44 - 1.00 mg/dL   Calcium 9.8 8.9 - 86.5 mg/dL   Total Protein 7.1 6.5 - 8.1 g/dL   Albumin 4.0 3.5 - 5.0 g/dL   AST 46 (H) 15 - 41 U/L   ALT 23 0 - 44 U/L   Alkaline Phosphatase 111 38 - 126 U/L   Total Bilirubin 1.7 (H) 0.0 - 1.2 mg/dL   GFR, Estimated 46 (L) >60 mL/min   Anion gap 13 5 - 15  Lipase, blood     Status: None   Collection Time: 12/12/23  3:41 PM  Result Value Ref Range   Lipase 25 11 - 51 U/L  Culture, blood (Routine X 2) w Reflex to ID Panel     Status: None (Preliminary result)   Collection Time:  12/13/23  5:08 AM   Specimen: BLOOD  Result Value Ref Range   Specimen Description BLOOD BLOOD RIGHT ARM    Special Requests      BOTTLES DRAWN AEROBIC AND ANAEROBIC Blood Culture adequate volume   Culture      NO GROWTH < 12 HOURS Performed at [facility], [address], [place], [place] [address]    Report Status PENDING   Comprehensive metabolic panel     Status: Abnormal   Collection Time: 12/13/23  5:09 AM  Result Value Ref Range   Sodium 132 (L) 135 - 145 mmol/L   Potassium 4.3 3.5 - 5.1 mmol/L   Chloride 98 98 - 111 mmol/L   CO2 22 22 - 32 mmol/L   Glucose, Bld 141 (H) 70 - 99 mg/dL   BUN 17 8 - 23 mg/dL   Creatinine, Ser 6.29 (H) 0.44 - 1.00 mg/dL   Calcium 9.3 8.9 - 52.8 mg/dL   Total Protein 6.1 (L) 6.5 - 8.1 g/dL   Albumin 3.3 (L) 3.5 - 5.0 g/dL   AST 413 (H) 15 - 41 U/L   ALT 60 (H) 0 - 44 U/L   Alkaline Phosphatase 121 38 - 126 U/L   Total Bilirubin 3.7 (H) 0.0 - 1.2 mg/dL   GFR, Estimated 43 (L) >60 mL/min   Anion gap 12 5 - 15  CBC     Status: Abnormal   Collection Time: 12/13/23  5:09 AM  Result Value Ref Range   WBC 12.7 (H) 4.0 - 10.5 K/uL   RBC 3.57 (L) 3.87 - 5.11 MIL/uL   Hemoglobin 10.3 (L) 12.0 - 15.0 g/dL   HCT 24.4 (L) 01.0 - 27.2 %   MCV 91.0 80.0 - 100.0 fL   MCH 28.9 26.0 - 34.0 pg   MCHC 31.7 30.0 - 36.0 g/dL   RDW 53.6 64.4 - 03.4 %   Platelets 207 150 - 400 K/uL   nRBC 0.0 0.0 - 0.2 %  Culture, blood (Routine X 2) w Reflex to ID Panel     Status: None (Preliminary result)   Collection Time: 12/13/23  5:09 AM   Specimen: BLOOD  Result Value Ref Range   Specimen Description BLOOD BLOOD RIGHT ARM    Special Requests  AEROBIC BOTTLE ONLY Blood Culture results may not be optimal due to an inadequate volume of blood received in culture bottles   Culture      NO GROWTH < 12 HOURS Performed at [facility], [address] [place], [place] [address]    Report Status PENDING      US PELVIS (TRANSABDOMINAL ONLY) Result  Date: 12/13/2023 CLINICAL DATA:  [id number] Cyst of left ovary [id number] EXAM: TRANSABDOMINAL ULTRASOUND OF PELVIS TECHNIQUE: Transabdominal ultrasound examination of the pelvis was performed including evaluation of the uterus, ovaries, adnexal regions, and pelvic cul-de-sac. COMPARISON:  MRI abdomen from earlier the same day and CT scan abdomen and pelvis from yesterday 12/12/2023. FINDINGS: Transvaginal exam could not be performed as patient was unable to tolerate/positioned to do the transvaginal exam. Uterus Measurements: 2.1 x 2.8 x 7.4 cm = volume: 22.8 mL. Small/atrophic uterus exhibiting diffuse myometrial calcifications, within normal limits for patient's age. No fibroids or other mass visualized. Endometrium Thickness: Not well seen but measures less than 2 mm in thickness. No discrete focal abnormality visualized. Right ovary Not visualized.  No large adnexal mass seen. Left ovary Not visualized. No large adnexal mass seen. Other findings:  No abnormal free fluid. IMPRESSION: 1. Limited transabdominal exam. 2. Bilateral ovaries are not visualized. No large adnexal mass seen on the provided images. 3. Small/atrophic uterus exhibiting myometrial calcifications, within normal limits for patient's age. No focal mass. Electronically Signed   By: Jules Schick M.D.   On: 12/13/2023 08:42   MR ABDOMEN MRCP W WO CONTAST Result Date: 12/13/2023 CLINICAL DATA:  88 year old female with history of acute cholecystitis. Biliary tract dilatation noted on prior CT examination. EXAM: MRI ABDOMEN WITHOUT AND WITH CONTRAST (INCLUDING MRCP) TECHNIQUE: Multiplanar multisequence MR imaging of the abdomen was performed both before and after the administration of intravenous contrast. Heavily T2-weighted images of the biliary and pancreatic ducts were obtained, and three-dimensional MRCP images were rendered by post processing. CONTRAST:  8mL GADAVIST GADOBUTROL 1 MMOL/ML IV SOLN COMPARISON:  None Available. FINDINGS: Comment:  Portions of today's examination are limited by substantial patient respiratory motion. Lower chest: Cardiomegaly with biatrial dilatation. Trace bilateral pleural effusions. Hepatobiliary: No definite suspicious cystic or solid hepatic lesions. Gallbladder is severely distended. Amorphous T2 hypointense material lying dependently in the gallbladder likely reflects biliary sludge and/or tiny gallstones. Gallbladder wall appears thickened and edematous with trace volume of pericholecystic fluid. MRCP images are severely limited by patient respiratory motion, essentially nondiagnostic. With this limitation in mind, no definite intra or extrahepatic biliary ductal dilatation is noted on today's examination. Pancreas: No pancreatic mass. No definite pancreatic ductal dilatation noted. No peripancreatic fluid collections or inflammatory changes. Spleen:  Unremarkable. Adrenals/Urinary Tract: T1 hypointense, T2 hyperintense, nonenhancing lesions in the right kidney measuring up to 2.6 cm in the upper pole, compatible with simple (Bosniak class 1) cysts (no imaging follow-up recommended). No hydroureteronephrosis in the visualized portions of the abdomen. Bilateral adrenal glands are normal in appearance. Stomach/Bowel: Visualized portions are unremarkable. Vascular/Lymphatic: No aneurysm identified in the visualized abdominal vasculature. No lymphadenopathy noted in the abdomen. Other: No significant volume of ascites noted in the visualized portions of the peritoneal cavity. Musculoskeletal: No aggressive appearing osseous lesions are noted in the visualized portions of the skeleton. IMPRESSION: 1. Study is severely limited by patient respiratory motion. Despite the limitations of today's examination, the findings are highly concerning for acute cholecystitis. Surgical consultation is recommended. 2. No definite choledocholithiasis or findings of biliary tract obstruction. 3.  Cardiomegaly with biatrial dilatation.  Electronically Signed   By: Trudie Reed M.D.   On: 12/13/2023 06:40   CT ABDOMEN PELVIS W CONTRAST Result Date: 12/12/2023 CLINICAL DATA:  Abdominal pain, acute (Ped 0-17y). Right upper quadrant abdominal pain EXAM: CT ABDOMEN AND PELVIS WITH CONTRAST TECHNIQUE: Multidetector CT imaging of the abdomen and pelvis was performed using the standard protocol following bolus administration of intravenous contrast. RADIATION DOSE REDUCTION: This exam was performed according to the departmental dose-optimization program which includes automated exposure control, adjustment of the mA and/or kV according to patient size and/or use of iterative reconstruction technique. CONTRAST:  75mL OMNIPAQUE IOHEXOL 350 MG/ML SOLN COMPARISON:  Sonogram 3:58 p.m. FINDINGS: Lower chest: Mild interstitial pulmonary edema. Mild global cardiomegaly with particular right atrial enlargement. Hepatobiliary: Cholelithiasis noted. The gallbladder is dilated and there is pericholecystic inflammatory stranding and trace free fluid in keeping with changes of acute calculus cholecystitis. There is mild intra and extrahepatic biliary ductal dilation to the level of the ampulla, however, a distal obstructing lesion or intraluminal stone is not identified on this limited examination. The liver is otherwise unremarkable. Pancreas: Atrophic but otherwise unremarkable. Spleen: Unremarkable Adrenals/Urinary Tract: The adrenal glands are unremarkable. The kidneys are normal in position. Moderate bilateral renal cortical atrophy with asymmetric cortical scarring involving the upper pole the right kidney. No enhancing intrarenal mass. Multiple simple cortical cysts are seen within the right kidney for which no follow-up imaging is recommended. No hydronephrosis. No intrarenal or ureteral calculi. The bladder is unremarkable. Stomach/Bowel: Severe sigmoid diverticulosis. The stomach, small bowel, and large bowel are otherwise unremarkable. Appendix normal.  No free intraperitoneal gas or fluid. Vascular/Lymphatic: Extensive aortoiliac atherosclerotic calcification. No aortic aneurysm. Shotty periportal adenopathy is nonspecific, possibly reactive in nature. No frankly pathologic adenopathy within the abdomen and pelvis. Reproductive: The left ovary is replaced with a heterogeneously enhancing solid lobulated mass measuring 2.6 x 4.4 cm, stable since prior exam when measured in similar fashion. Uterus and right adnexa are unremarkable. Other: No abdominal wall hernia Musculoskeletal: Osseous structures are age-appropriate. Remote T12 compression deformity is unchanged without associated retrolisthesis or retropulsion. No acute bone abnormality. IMPRESSION: 1. Acute calculus cholecystitis. 2. Mild intra and extrahepatic biliary ductal dilation to the level of the ampulla, however, a distal obstructing lesion or intraluminal stone is not identified on this limited examination. Correlation with liver function tests may be helpful. If indicated, this could be further assessed with MRCP or intraoperative cholangiography. 3. Severe sigmoid diverticulosis. 4. Stable 4.4 cm solid lobulated mass replacing the left ovary. While this may represent a benign ovarian neoplasm, gynecologic consultation is recommended for further management. 5. Mild global cardiomegaly with particular right atrial enlargement. Mild interstitial pulmonary edema. Aortic Atherosclerosis (ICD10-I70.0). Electronically Signed   By: Helyn Numbers M.D.   On: 12/12/2023 20:08   US Abdomen Limited RUQ (LIVER/GB) Result Date: 12/12/2023 CLINICAL DATA:  Right upper quadrant abdominal pain EXAM: ULTRASOUND ABDOMEN LIMITED RIGHT UPPER QUADRANT COMPARISON:  CT 08/17/2022. FINDINGS: Gallbladder: Dilated gallbladder. Slight wall thickening of 4 mm. Layering small stones. No adjacent fluid. No reported sonographic Murphy's sign. Common bile duct: Diameter: Common duct upper 9 mm, upper limits of normal for the  patient's the patient's age. Liver: No focal lesion identified. Within normal limits in parenchymal echogenicity. Portal vein is patent on color Doppler imaging with normal direction of blood flow towards the liver. Other: Note is made of a right-sided renal cyst which appears simple measuring up to 2.7 cm. Small right-sided pleural effusion.  IMPRESSION: Dilated gallbladder with layering stones. Slight wall thickening. Please correlate for other clinical findings of acute cholecystitis and if confirmatory study is needed, HIDA scan may be of some benefit. Common duct at 9 mm, upper limits of normal for the the patient's age. Small right-sided pleural effusion. Electronically Signed   By: Karen Kays M.D.   On: 12/12/2023 16:31    ROS:  As stated above in the HPI otherwise negative.  Blood pressure (!) 121/48, pulse (!) 58, temperature 99.1 F (37.3 C), temperature source Oral, resp. rate 19, SpO2 93%.    PE: Gen: NAD, Alert and Oriented HEENT:  [place]/AT, EOMI Lungs: CTA Bilaterally CV: Irreg, irreg ABD: Soft, mild tenderness in the RUQ, +BS Ext: No C/C/E  Assessment/Plan: 1) Acute cholecystitis. 2) ? Choledocholithiasis. 3) elevated liver enzymes.   The elevation in her liver enzymes can be from an acute cholecystitis, however, it is not clear if she does have CBD stone(s).  The image quality was poor.  She is currently stable and afebrile.  Her WBC is mildly elevated, but there is no current suggestion of an ascending cholangitis.  Plan: 1) Maintain NPO status. 2) Follow liver enzymes.  If the blood work in the AM shows an increasingly obstructive pattern, an EUS/ERCP will be performed. 3) Continue with antibiotics.  Donevin Sainsbury D 12/13/2023, 10:50 AM

## 2023-12-13 NOTE — ED Notes (Signed)
 Patient transported to [place]

## 2023-12-13 NOTE — ED Notes (Signed)
Patient informed need for urine sample. Assisted patient to bedpan and patient was unable to provide sample.

## 2023-12-13 NOTE — Consult Note (Addendum)
Cardiology Consultation   Patient ID: Anna [name] MRN: [id number]; DOB: [birth year]  Admit date: 12/12/2023 Date of Consult: 12/13/2023  PCP:  Marcine Matar, MD   [facility] Providers Cardiologist:  Armanda Magic, MD        Patient Profile:   Anna [name] is a 88 y.o. 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 Mahala Menghini.  History of Present Illness:   Ms. Burkman has had permanent atrial fibrillation for many years, not on higher dose AVN blocking agents due to hx of bradycardia per notes (on carvedilol 3.125mg  BID PTA). In 07/2022 she had a NSTEMI with cath showing mild nonobstructive CAD (minimal luminal irregularities without measurable stenoses) and mildly elevated LVEDP. Echo had shown EF 25% with WMA felt c/w Takotsubo cardiomyopathy. She was treated with GDMT with normalization of LVEF. Repeat echo 09/2023 to follow MV disease showed EF 55-60%, elevated LVEDP, normal RV/PASP, severe BAE, trivial MR, mild MS, dilated IVC.  She presented to the ED this admission with RUQ pain, urinary retention with dark colored urine, nausea and abdominal tenderness. [place] + CT + MRCP concerning for acute cholecystitis. She also had severe diverticulosis and 4.4cm stable ovarian mass, mild cardiomegaly with mild interstitial pulmonary edema. In ED, initial BP 206/92 in setting of abdominal pain. She received pain medication, Zofran, abx, IV fluids with subsequent decline in BP to 106/40. CA-125 and pelvic US also pending to eval ovarian mass. Cardiology asked to see for risk stratification. Gen surg also recommended palliative consultation to discuss GOC and MOST  form.  She denies any recent CP and feels her heart has been doing "good." She reports chronic DOE which she attributes to not being very active. In recent years she has had knee problems that have prevented her from walking very far without having to sit down. She denies any recent change in this. No orthopnea, PND, syncope, palpitations.    Past Medical History:  Diagnosis Date   Hyperlipidemia    Hypertension    Mild CAD    Mitral regurgitation    trivial   Mitral stenosis    mild   Osteoarthritis    Overweight(278.02)    Permanent atrial fibrillation (HCC)    Takotsubo cardiomyopathy     Past Surgical History:  Procedure Laterality Date   CARDIOVERSION N/A 03/24/2014   Procedure: CARDIOVERSION;  Surgeon: Quintella Reichert, MD;  Location: [facility];  Service: Cardiovascular;  Laterality: N/A;   LEFT HEART CATH AND CORONARY ANGIOGRAPHY N/A 08/20/2022   Procedure: LEFT HEART CATH AND CORONARY ANGIOGRAPHY;  Surgeon: Corky Crafts, MD;  Location: [facility] INVASIVE CV LAB;  Service: Cardiovascular;  Laterality: N/A;     Home Medications:  Prior to Admission medications   Medication Sig Start Date End Date Taking? Authorizing Provider  carvedilol (COREG) 3.125 MG tablet Take 1 tablet (3.125 mg total) by mouth 2 (two) times daily with a meal. 08/22/23  Yes Marcine Matar, MD  Cholecalciferol (VITAMIN D3) 10 MCG (400 UNIT) tablet Take 2 tablets (800 Units total) by mouth daily. 08/26/22  Yes Marcine Matar, MD  cyclobenzaprine (FLEXERIL) 10 MG tablet Take 0.5 tablets (5 mg total) by mouth 2 (  two) times daily as needed for muscle spasms. 03/30/21  Yes Marcine Matar, MD  dapagliflozin propanediol (FARXIGA) 10 MG TABS tablet TAKE 1 TABLET BY MOUTH EVERY DAY 10/24/23  Yes Turner, Cornelious Bryant, MD  diclofenac Sodium (VOLTAREN) 1 % GEL Apply 2 grams to knees 4 times a day as needed. Patient taking differently: Apply 2 g topically 4 (four) times daily as needed (for knee pain). Apply 2  grams to knees 4 times a day as needed. 10/02/23  Yes Marcine Matar, MD  docusate sodium (COLACE) 100 MG capsule Take 100 mg by mouth daily.    Yes [provider]  ELIQUIS 5 MG TABS tablet TAKE 1 TABLET BY MOUTH TWICE A DAY 12/05/22  Yes Turner, Traci R, MD  ferrous sulfate 325 (65 FE) MG tablet TAKE 1 TABLET BY MOUTH EVERY DAY WITH BREAKFAST 11/27/23  Yes Marcine Matar, MD  furosemide (LASIX) 20 MG tablet Take 1 tablet (20 mg total) by mouth as needed. Patient taking differently: Take 20 mg by mouth as needed for edema. 10/06/23  Yes Chandrasekhar, Mahesh A, MD  HYDROcodone-acetaminophen (NORCO) 7.5-325 MG tablet Take 1 tablet by mouth 4 (four) times daily as needed. 09/05/23  Yes [provider]  Omega-3 Fatty Acids (FISH OIL CONCENTRATE PO) Take 1 capsule by mouth daily.    Yes [provider]  rosuvastatin (CRESTOR) 10 MG tablet TAKE 1 TABLET BY MOUTH EVERY MONDAY, WEDNESDAY, FRIDAY, AND SATURDAY 12/08/23  Yes Marcine Matar, MD  sacubitril-valsartan (ENTRESTO) 24-26 MG TAKE 1 TABLET BY MOUTH TWICE A DAY 10/24/23  Yes Turner, Cornelious Bryant, MD  spironolactone (ALDACTONE) 25 MG tablet TAKE 1/2 TABLET BY MOUTH EVERY DAY 10/24/23  Yes Turner, Cornelious Bryant, MD  White Petrolatum-Mineral Oil (SYSTANE NIGHTTIME) OINT Place 1 drop into the left eye 4 (four) times daily.   Yes [provider]  Misc. Devices MISC Electric Wheelchair 03/29/21   Marcine Matar, MD  Misc. Devices MISC Rollator walker 03/29/21   Marcine Matar, MD  Misc. Devices [facility] bed 03/29/21   Marcine Matar, MD  naloxone [facility]) nasal spray 4 mg/0.1 mL Place 1 spray into the nose once.    [provider]    Inpatient Medications: Scheduled Meds:  ferrous sulfate  325 mg Oral Q breakfast   rosuvastatin  10 mg Oral Once per day on Monday Wednesday Friday Saturday   sodium chloride flush  3 mL Intravenous Q12H   sodium chloride flush  3 mL Intravenous Q12H   Continuous  Infusions:  sodium chloride     cefTRIAXone (ROCEPHIN)  IV     lactated ringers     metronidazole Stopped (12/13/23 0735)   PRN Meds: sodium chloride, acetaminophen **OR** acetaminophen, cyclobenzaprine, diclofenac Sodium, HYDROcodone-acetaminophen, morphine injection, ondansetron **OR** ondansetron (ZOFRAN) IV, senna-docusate, sodium chloride flush  Allergies:   No Known Allergies  Social History:   Social History   Socioeconomic History   Marital status: Divorced    Spouse name: Not on file   Number of children: 3   Years of education: Not on file   Highest education level: Not on file  Occupational History   Occupation: retired  Tobacco Use   Smoking status: Never   Smokeless tobacco: Never  Vaping Use   Vaping status: Never Used  Substance and Sexual Activity   Alcohol use: No   Drug use: No   Sexual activity: Not on file  Other Topics Concern   Not on file  Social History Narrative   Not on file   Social Drivers of Health   Financial Resource Strain: Low Risk  (02/21/2023)   Overall Financial Resource Strain (CARDIA)    Difficulty of Paying Living Expenses: Not hard at all  Food Insecurity: No Food Insecurity (02/21/2023)   Hunger Vital Sign    Worried About Running Out of Food in the Last Year: Never true    Ran Out of Food in the Last Year: Never true  Transportation Needs: No Transportation Needs (02/21/2023)   PRAPARE - Administrator, Civil Service (Medical): No    Lack of Transportation (Non-Medical): No  Physical Activity: Inactive (02/21/2023)   Exercise Vital Sign    Days of Exercise per Week: 0 days    Minutes of Exercise per Session: 0 min  Stress: No Stress Concern Present (02/21/2023)   [employer] of Occupational Health - Occupational Stress Questionnaire    Feeling of Stress : Not at all  Social Connections: Not on file  Intimate Partner Violence: Not At Risk (02/21/2023)   Humiliation, Afraid, Rape, and Kick questionnaire     Fear of Current or Ex-Partner: No    Emotionally Abused: No    Physically Abused: No    Sexually Abused: No    Family History:   Family History  Problem Relation Age of Onset   Alzheimer's disease Mother      ROS:  Please see the history of present illness.   All other ROS reviewed and negative.     Physical Exam/Data:   Vitals:   12/13/23 0700 12/13/23 0715 12/13/23 0730 12/13/23 0745  BP: (!) 125/44 (!) 122/44 (!) 116/42 (!) 106/40  Pulse: (!) 56 62 (!) 59 (!) 57  Resp: 18 15 17 14   Temp:      TempSrc:      SpO2: 95% 94% 95% 93%    Intake/Output Summary (Last 24 hours) at 12/13/2023 0843 Last data filed at 12/13/2023 0735 Gross per 24 hour  Intake 448.71 ml  Output --  Net 448.71 ml      10/02/2023    3:46 PM 07/09/2023    2:20 PM 06/02/2023    3:28 PM  Last 3 Weights  Weight (lbs) 150 lb 154 lb 163 lb  Weight (kg) 68.04 kg 69.854 kg 73.936 kg     There is no height or weight on file to calculate BMI.  General: Well developed elderly WF in no acute distress. Head: Normocephalic, atraumatic, sclera non-icteric, no xanthomas, nares are without discharge. Neck: Negative for carotid bruits. JVP not elevated. Lungs: Clear bilaterally to auscultation without wheezes, rales, or rhonchi. Breathing is unlabored. Heart: Irregularly irregular, low normal HR, S1 S2 without murmurs, rubs, or gallops.  Abdomen: Soft, non-tender, non-distended with normoactive bowel sounds. No rebound/guarding. Extremities: No clubbing or cyanosis. No edema. Distal pedal pulses are 2+ and equal bilaterally. Neuro: Alert and oriented X 3. Moves all extremities spontaneously. Psych:  Responds to questions appropriately with a normal affect.   EKG:  The EKG was personally reviewed and demonstrates:  atrial fibrillation with SVR 50bpm, NSVICD/LBBB pattern nonspecific STTW changes with U waves noted - QRS wider than 2023 though prior TWI appear improved  Telemetry:  Telemetry was personally reviewed  and demonstrates:  AF HR mid 40s-mid 50s  Relevant CV Studies: 2d echo 07/2023  1. Left ventricular ejection fraction, by estimation, is 55 to 60%. The  left ventricle has normal function. The left ventricle has no regional  wall motion abnormalities. Left ventricular diastolic parameters are  indeterminate. Elevated left ventricular  end-diastolic pressure.   2. Right ventricular systolic function is normal. The right ventricular  size is normal. There is normal pulmonary artery systolic pressure.   3. Left atrial size was severely dilated.   4. Right atrial size was severely dilated.   5. The mitral valve is degenerative. Trivial mitral valve regurgitation.  Mild mitral stenosis. The mean mitral valve gradient is 4.8 mmHg. Moderate  mitral annular calcification.   6. The aortic valve is normal in structure. Aortic valve regurgitation is  not visualized. No aortic stenosis is present.   7. The inferior vena cava is dilated in size with >50% respiratory  variability, suggesting right atrial pressure of 8 mmHg.   Laboratory Data:  High Sensitivity Troponin:  No results for input(s): "TROPONINIHS" in the last 720 hours.   Chemistry Recent Labs  Lab 12/12/23 1541 12/13/23 0509  NA 136 132*  K 4.2 4.3  CL 101 98  CO2 22 22  GLUCOSE 151* 141*  BUN 17 17  CREATININE 1.15* 1.23*  CALCIUM 9.8 9.3  GFRNONAA 46* 43*  ANIONGAP 13 12    Recent Labs  Lab 12/12/23 1541 12/13/23 0509  PROT 7.1 6.1*  ALBUMIN 4.0 3.3*  AST 46* 141*  ALT 23 60*  ALKPHOS 111 121  BILITOT 1.7* 3.7*   Lipids No results for input(s): "CHOL", "TRIG", "HDL", "LABVLDL", "LDLCALC", "CHOLHDL" in the last 168 hours.  Hematology Recent Labs  Lab 12/12/23 1541 12/13/23 0509  WBC 7.9 12.7*  RBC 3.93 3.57*  HGB 11.3* 10.3*  HCT 35.7* 32.5*  MCV 90.8 91.0  MCH 28.8 28.9  MCHC 31.7 31.7  RDW 13.5 13.6  PLT 220 207   Thyroid No results for input(s): "TSH", "FREET4" in the last 168 hours.  BNPNo  results for input(s): "BNP", "PROBNP" in the last 168 hours.  DDimer No results for input(s): "DDIMER" in the last 168 hours.   Radiology/Studies:  MR ABDOMEN MRCP W WO CONTAST Result Date: 12/13/2023 CLINICAL DATA:  88 year old female with history of acute cholecystitis. Biliary tract dilatation noted on prior CT examination. EXAM: MRI ABDOMEN WITHOUT AND WITH CONTRAST (INCLUDING MRCP) TECHNIQUE: Multiplanar multisequence MR imaging of the abdomen was performed both before and after the administration of intravenous contrast. Heavily T2-weighted images of the biliary and pancreatic ducts were obtained, and three-dimensional MRCP images were rendered by post processing. CONTRAST:  8mL GADAVIST GADOBUTROL 1 MMOL/ML IV SOLN COMPARISON:  None Available. FINDINGS: Comment: Portions of today's examination are limited by substantial patient respiratory motion. Lower chest: Cardiomegaly with biatrial dilatation. Trace bilateral pleural effusions. Hepatobiliary: No definite suspicious cystic or solid hepatic lesions. Gallbladder is severely distended. Amorphous T2 hypointense material lying dependently in the gallbladder likely reflects biliary sludge and/or tiny gallstones. Gallbladder wall appears thickened and edematous with trace volume of pericholecystic fluid. MRCP images are severely limited by patient respiratory motion, essentially nondiagnostic. With this limitation in mind, no definite intra or extrahepatic biliary ductal dilatation is noted on today's examination. Pancreas: No pancreatic mass. No definite pancreatic ductal dilatation noted. No peripancreatic fluid collections or inflammatory changes. Spleen:  Unremarkable. Adrenals/Urinary Tract: T1 hypointense, T2 hyperintense, nonenhancing lesions in the right kidney measuring up to 2.6 cm in the upper pole, compatible with simple (Bosniak class 1) cysts (no imaging follow-up recommended). No hydroureteronephrosis in the visualized portions of the  abdomen. Bilateral adrenal glands are normal in appearance. Stomach/Bowel: Visualized portions are unremarkable. Vascular/Lymphatic: No  aneurysm identified in the visualized abdominal vasculature. No lymphadenopathy noted in the abdomen. Other: No significant volume of ascites noted in the visualized portions of the peritoneal cavity. Musculoskeletal: No aggressive appearing osseous lesions are noted in the visualized portions of the skeleton. IMPRESSION: 1. Study is severely limited by patient respiratory motion. Despite the limitations of today's examination, the findings are highly concerning for acute cholecystitis. Surgical consultation is recommended. 2. No definite choledocholithiasis or findings of biliary tract obstruction. 3. Cardiomegaly with biatrial dilatation. Electronically Signed   By: Trudie Reed M.D.   On: 12/13/2023 06:40   CT ABDOMEN PELVIS W CONTRAST Result Date: 12/12/2023 CLINICAL DATA:  Abdominal pain, acute (Ped 0-17y). Right upper quadrant abdominal pain EXAM: CT ABDOMEN AND PELVIS WITH CONTRAST TECHNIQUE: Multidetector CT imaging of the abdomen and pelvis was performed using the standard protocol following bolus administration of intravenous contrast. RADIATION DOSE REDUCTION: This exam was performed according to the departmental dose-optimization program which includes automated exposure control, adjustment of the mA and/or kV according to patient size and/or use of iterative reconstruction technique. CONTRAST:  75mL OMNIPAQUE IOHEXOL 350 MG/ML SOLN COMPARISON:  Sonogram 3:58 p.m. FINDINGS: Lower chest: Mild interstitial pulmonary edema. Mild global cardiomegaly with particular right atrial enlargement. Hepatobiliary: Cholelithiasis noted. The gallbladder is dilated and there is pericholecystic inflammatory stranding and trace free fluid in keeping with changes of acute calculus cholecystitis. There is mild intra and extrahepatic biliary ductal dilation to the level of the  ampulla, however, a distal obstructing lesion or intraluminal stone is not identified on this limited examination. The liver is otherwise unremarkable. Pancreas: Atrophic but otherwise unremarkable. Spleen: Unremarkable Adrenals/Urinary Tract: The adrenal glands are unremarkable. The kidneys are normal in position. Moderate bilateral renal cortical atrophy with asymmetric cortical scarring involving the upper pole the right kidney. No enhancing intrarenal mass. Multiple simple cortical cysts are seen within the right kidney for which no follow-up imaging is recommended. No hydronephrosis. No intrarenal or ureteral calculi. The bladder is unremarkable. Stomach/Bowel: Severe sigmoid diverticulosis. The stomach, small bowel, and large bowel are otherwise unremarkable. Appendix normal. No free intraperitoneal gas or fluid. Vascular/Lymphatic: Extensive aortoiliac atherosclerotic calcification. No aortic aneurysm. Shotty periportal adenopathy is nonspecific, possibly reactive in nature. No frankly pathologic adenopathy within the abdomen and pelvis. Reproductive: The left ovary is replaced with a heterogeneously enhancing solid lobulated mass measuring 2.6 x 4.4 cm, stable since prior exam when measured in similar fashion. Uterus and right adnexa are unremarkable. Other: No abdominal wall hernia Musculoskeletal: Osseous structures are age-appropriate. Remote T12 compression deformity is unchanged without associated retrolisthesis or retropulsion. No acute bone abnormality. IMPRESSION: 1. Acute calculus cholecystitis. 2. Mild intra and extrahepatic biliary ductal dilation to the level of the ampulla, however, a distal obstructing lesion or intraluminal stone is not identified on this limited examination. Correlation with liver function tests may be helpful. If indicated, this could be further assessed with MRCP or intraoperative cholangiography. 3. Severe sigmoid diverticulosis. 4. Stable 4.4 cm solid lobulated mass  replacing the left ovary. While this may represent a benign ovarian neoplasm, gynecologic consultation is recommended for further management. 5. Mild global cardiomegaly with particular right atrial enlargement. Mild interstitial pulmonary edema. Aortic Atherosclerosis (ICD10-I70.0). Electronically Signed   By: Helyn Numbers M.D.   On: 12/12/2023 20:08   US Abdomen Limited RUQ (LIVER/GB) Result Date: 12/12/2023 CLINICAL DATA:  Right upper quadrant abdominal pain EXAM: ULTRASOUND ABDOMEN LIMITED RIGHT UPPER QUADRANT COMPARISON:  CT 08/17/2022. FINDINGS: Gallbladder: Dilated gallbladder. Slight wall thickening of  4 mm. Layering small stones. No adjacent fluid. No reported sonographic Murphy's sign. Common bile duct: Diameter: Common duct upper 9 mm, upper limits of normal for the patient's the patient's age. Liver: No focal lesion identified. Within normal limits in parenchymal echogenicity. Portal vein is patent on color Doppler imaging with normal direction of blood flow towards the liver. Other: Note is made of a right-sided renal cyst which appears simple measuring up to 2.7 cm. Small right-sided pleural effusion. IMPRESSION: Dilated gallbladder with layering stones. Slight wall thickening. Please correlate for other clinical findings of acute cholecystitis and if confirmatory study is needed, HIDA scan may be of some benefit. Common duct at 9 mm, upper limits of normal for the the patient's age. Small right-sided pleural effusion. Electronically Signed   By: Karen Kays M.D.   On: 12/12/2023 16:31     Assessment and Plan:   1. Preoperative risk stratification with h/o Takotsubo cardiomyopathy with chronic HFimpEF with small right pleural effusion, and mild interstitial pulmonary edema noted in setting of marked HTN on arrival - RCRI is 1 indicating 6.0% risk of CV event given hx of CHF, unable to complete over 4 METS due to chronic unchanged dyspnea with exertion in setting of generalized debilitation  and knee problems, but clinically denies any recent change in baseline functional capacity or cardiac symptoms - patient initially hypertensive on arrival in setting of abdominal pain which may have contributed to some fluid development as noted, but BPs now trending soft so will hold GDMT - had fairly recent echo which showed stable LVEF and valvular findings - EKG shows atrial fib with progression to LBBB pattern (previous NSIVCD) though overall similar to prior if not improved given resolution of some of her TWI - will discuss further recs with MD  2. Minimal CAD by cath 07/2022 - does not require ASA for this finding given concomitant Eliquis PTA - can hold statin acutely with liver function trending  3. Permanent atrial fibrillation with slow ventricular response, hx of bradycardia - given hx of bradycardia, HR 50 bpm on arrival (mid 40s overnight) and BP trending soft, hold carvedilol for now - Eliquis on hold for possible surgery  4. Mild mitral stenosis by echo 09/2023 - stable from previous study in 2023, not yet at a point where we need to consider transition off DOAC  Remainder per primary team - CKD 2-3a - mild anemia - ovarian mass workup underway - hyponatremia   Risk Assessment/Risk Scores:        New York Heart Association (NYHA) Functional Class NYHA Class II  CHA2DS2-VASc Score =     This indicates a  % annual risk of stroke. The patient's score is based upon:      For questions or updates, please contact [facility] Please consult [email or web address] for contact info under    Signed, Laurann Montana, PA-C  12/13/2023 8:43 AM

## 2023-12-14 ENCOUNTER — Inpatient Hospital Stay (HOSPITAL_COMMUNITY): Payer: 59

## 2023-12-14 ENCOUNTER — Encounter (HOSPITAL_COMMUNITY)
Admission: EM | Disposition: A | Discharge status: Home Health | Payer: Self-pay | Source: Home / Self Care | Attending: Internal Medicine

## 2023-12-14 ENCOUNTER — Inpatient Hospital Stay (HOSPITAL_COMMUNITY): Payer: 59 | Admitting: Anesthesiology

## 2023-12-14 ENCOUNTER — Encounter (HOSPITAL_COMMUNITY): Payer: Self-pay | Admitting: Internal Medicine

## 2023-12-14 DIAGNOSIS — K8050 Calculus of bile duct without cholangitis or cholecystitis without obstruction: Secondary | ICD-10-CM | POA: Diagnosis not present

## 2023-12-14 DIAGNOSIS — G894 Chronic pain syndrome: Secondary | ICD-10-CM

## 2023-12-14 DIAGNOSIS — I4821 Permanent atrial fibrillation: Secondary | ICD-10-CM | POA: Diagnosis not present

## 2023-12-14 DIAGNOSIS — K838 Other specified diseases of biliary tract: Secondary | ICD-10-CM | POA: Diagnosis not present

## 2023-12-14 DIAGNOSIS — I5181 Takotsubo syndrome: Secondary | ICD-10-CM

## 2023-12-14 DIAGNOSIS — K8000 Calculus of gallbladder with acute cholecystitis without obstruction: Secondary | ICD-10-CM | POA: Diagnosis not present

## 2023-12-14 DIAGNOSIS — I5032 Chronic diastolic (congestive) heart failure: Secondary | ICD-10-CM | POA: Diagnosis not present

## 2023-12-14 HISTORY — PX: ESOPHAGOGASTRODUODENOSCOPY (EGD) WITH PROPOFOL: SHX5813

## 2023-12-14 HISTORY — PX: EUS: SHX5427

## 2023-12-14 LAB — CBC WITH DIFFERENTIAL/PLATELET
Abs Immature Granulocytes: 0.02 10*3/uL (ref 0.00–0.07)
Basophils Absolute: 0 10*3/uL (ref 0.0–0.1)
Basophils Relative: 1 %
Eosinophils Absolute: 0.1 10*3/uL (ref 0.0–0.5)
Eosinophils Relative: 2 %
HCT: 27.8 % — ABNORMAL LOW (ref 36.0–46.0)
Hemoglobin: 9.1 g/dL — ABNORMAL LOW (ref 12.0–15.0)
Immature Granulocytes: 0 %
Lymphocytes Relative: 17 %
Lymphs Abs: 0.9 10*3/uL (ref 0.7–4.0)
MCH: 29.3 pg (ref 26.0–34.0)
MCHC: 32.7 g/dL (ref 30.0–36.0)
MCV: 89.4 fL (ref 80.0–100.0)
Monocytes Absolute: 0.5 10*3/uL (ref 0.1–1.0)
Monocytes Relative: 10 %
Neutro Abs: 3.6 10*3/uL (ref 1.7–7.7)
Neutrophils Relative %: 70 %
Platelets: 159 10*3/uL (ref 150–400)
RBC: 3.11 MIL/uL — ABNORMAL LOW (ref 3.87–5.11)
RDW: 14.1 % (ref 11.5–15.5)
WBC: 5.2 10*3/uL (ref 4.0–10.5)
nRBC: 0 % (ref 0.0–0.2)

## 2023-12-14 LAB — COMPREHENSIVE METABOLIC PANEL
ALT: 47 U/L — ABNORMAL HIGH (ref 0–44)
AST: 74 U/L — ABNORMAL HIGH (ref 15–41)
Albumin: 2.9 g/dL — ABNORMAL LOW (ref 3.5–5.0)
Alkaline Phosphatase: 133 U/L — ABNORMAL HIGH (ref 38–126)
Anion gap: 15 (ref 5–15)
BUN: 20 mg/dL (ref 8–23)
CO2: 21 mmol/L — ABNORMAL LOW (ref 22–32)
Calcium: 9.7 mg/dL (ref 8.9–10.3)
Chloride: 99 mmol/L (ref 98–111)
Creatinine, Ser: 1.41 mg/dL — ABNORMAL HIGH (ref 0.44–1.00)
GFR, Estimated: 36 mL/min — ABNORMAL LOW (ref >60)
Glucose, Bld: 75 mg/dL (ref 70–99)
Potassium: 4.1 mmol/L (ref 3.5–5.1)
Sodium: 135 mmol/L (ref 135–145)
Total Bilirubin: 3.5 mg/dL — ABNORMAL HIGH (ref 0.0–1.2)
Total Protein: 5.4 g/dL — ABNORMAL LOW (ref 6.5–8.1)

## 2023-12-14 SURGERY — UPPER ENDOSCOPIC ULTRASOUND (EUS) LINEAR
Anesthesia: General

## 2023-12-14 MED ORDER — SACUBITRIL-VALSARTAN 24-26 MG PO TABS
1.0000 | ORAL_TABLET | Freq: Two times a day (BID) | ORAL | Status: DC
  Administered 2023-12-14 – 2023-12-16 (×4): 1 via ORAL
  Filled 2023-12-14 (×3): qty 1

## 2023-12-14 MED ORDER — GLUCAGON HCL RDNA (DIAGNOSTIC) 1 MG IJ SOLR
INTRAMUSCULAR | Status: AC
  Filled 2023-12-14: qty 2

## 2023-12-14 MED ORDER — LACTATED RINGERS IV SOLN: INTRAVENOUS | Status: DC | PRN

## 2023-12-14 MED ORDER — ONDANSETRON HCL 4 MG/2ML IJ SOLN
INTRAMUSCULAR | Status: DC | PRN
  Administered 2023-12-14: 4 mg via INTRAVENOUS

## 2023-12-14 MED ORDER — DICLOFENAC SUPPOSITORY 100 MG
RECTAL | Status: AC
  Filled 2023-12-14: qty 1

## 2023-12-14 MED ORDER — FENTANYL CITRATE (PF) 100 MCG/2ML IJ SOLN
INTRAMUSCULAR | Status: AC
  Filled 2023-12-14: qty 2

## 2023-12-14 MED ORDER — SUGAMMADEX SODIUM 200 MG/2ML IV SOLN
INTRAVENOUS | Status: DC | PRN
  Administered 2023-12-14: 150 mg via INTRAVENOUS

## 2023-12-14 MED ORDER — LIDOCAINE 2% (20 MG/ML) 5 ML SYRINGE
INTRAMUSCULAR | Status: DC | PRN
  Administered 2023-12-14: 40 mg via INTRAVENOUS

## 2023-12-14 MED ORDER — ROCURONIUM BROMIDE 10 MG/ML (PF) SYRINGE
PREFILLED_SYRINGE | INTRAVENOUS | Status: DC | PRN
  Administered 2023-12-14: 40 mg via INTRAVENOUS

## 2023-12-14 MED ORDER — CARVEDILOL 3.125 MG PO TABS
3.1250 mg | ORAL_TABLET | Freq: Two times a day (BID) | ORAL | Status: DC
  Administered 2023-12-16 (×2): 3.125 mg via ORAL
  Filled 2023-12-14 (×4): qty 1

## 2023-12-14 MED ORDER — FENTANYL CITRATE (PF) 250 MCG/5ML IJ SOLN
INTRAMUSCULAR | Status: DC | PRN
  Administered 2023-12-14 (×2): 50 ug via INTRAVENOUS

## 2023-12-14 MED ORDER — CIPROFLOXACIN IN D5W 400 MG/200ML IV SOLN
INTRAVENOUS | Status: AC
  Filled 2023-12-14: qty 200

## 2023-12-14 MED ORDER — SPIRONOLACTONE 12.5 MG HALF TABLET
12.5000 mg | ORAL_TABLET | Freq: Every day | ORAL | Status: DC
  Administered 2023-12-14 – 2023-12-16 (×3): 12.5 mg via ORAL
  Filled 2023-12-14 (×2): qty 1

## 2023-12-14 MED ORDER — PROPOFOL 10 MG/ML IV BOLUS
INTRAVENOUS | Status: DC | PRN
  Administered 2023-12-14: 50 mg via INTRAVENOUS

## 2023-12-14 MED ORDER — PHENYLEPHRINE 80 MCG/ML (10ML) SYRINGE FOR IV PUSH (FOR BLOOD PRESSURE SUPPORT)
PREFILLED_SYRINGE | INTRAVENOUS | Status: DC | PRN
  Administered 2023-12-14: 80 ug via INTRAVENOUS

## 2023-12-14 NOTE — Anesthesia Preprocedure Evaluation (Signed)
Anesthesia Evaluation  Patient identified by MRN, date of birth, ID band Patient awake    Reviewed: Allergy & Precautions, H&P , NPO status , Patient's Chart, lab work & pertinent test results, reviewed documented beta blocker date and time   Airway Mallampati: II  TM Distance: >3 FB Neck ROM: Full    Dental no notable dental hx. (+) Edentulous Upper, Edentulous Lower, Dental Advisory Given   Pulmonary neg pulmonary ROS   Pulmonary exam normal breath sounds clear to auscultation       Cardiovascular hypertension, Pt. on medications and Pt. on home beta blockers + CAD and +CHF  + dysrhythmias Atrial Fibrillation + Valvular Problems/Murmurs  Rhythm:Regular Rate:Normal     Neuro/Psych negative neurological ROS  negative psych ROS   GI/Hepatic negative GI ROS, Neg liver ROS,,,  Endo/Other  negative endocrine ROS    Renal/GU Renal InsufficiencyRenal disease  negative genitourinary   Musculoskeletal  (+) Arthritis , Osteoarthritis,    Abdominal   Peds  Hematology  (+) Blood dyscrasia, anemia   Anesthesia Other Findings   Reproductive/Obstetrics negative OB ROS                             Anesthesia Physical Anesthesia Plan  ASA: 3  Anesthesia Plan: General   Post-op Pain Management: Minimal or no pain anticipated   Induction: Intravenous  PONV Risk Score and Plan: 3 and Ondansetron, Dexamethasone and Treatment may vary due to age or medical condition  Airway Management Planned: Oral ETT  Additional Equipment:   Intra-op Plan:   Post-operative Plan: Extubation in OR  Informed Consent: I have reviewed the patients History and Physical, chart, labs and discussed the procedure including the risks, benefits and alternatives for the proposed anesthesia with the patient or authorized representative who has indicated his/her understanding and acceptance.     Dental advisory  given  Plan Discussed with: CRNA  Anesthesia Plan Comments:        Anesthesia Quick Evaluation

## 2023-12-14 NOTE — Progress Notes (Signed)
Progress Note   Patient: Anna [name]:[id number] DOB: 01-10-1936 DOA: 12/12/2023     1 DOS: the patient was seen and examined on 12/14/2023   Brief hospital course: Taken from H&P.  Anna [name] is a 88 y.o. female with medical history significant of permanent atrial fibrillation, diastolic heart failure preserved EF 55 to 60%, prediabetes, CKD stage II, stress-induced cardiomyopathy, mitral regurgitation, aortic regurgitation, lumbar radiculopathy, osteoarthritis, history of vertebral fracture currently following pain management presented to emergency department complaining of right upper quadrant abdominal pain, poor urinary output and constipation. She denies any fever, chill, nausea, vomiting and diarrhea.  On presentation had significantly elevated blood pressure at 206/92 which was later improved.  CBC unremarkable, CMP with creatinine at 1.15, T. bili 1.7, AST 47, normal lipase at 25. CT abdomen and pelvis with concern of acute calculus cholecystitis with mild intra and extrahepatic biliary ductal dilation.  Also noted to have a stable 4.4 cm solid lobulated left ovarian mass with recommendation to follow-up with gynecology.  CA125 was ordered  RUQ ultrasound with dilated gallbladder, layering of stone, slight wall thickening.  Gastroenterology and general surgery was consulted.  Started on ceftriaxone and Flagyl.  2/16: Hemodynamically stable, progressively worsening creatinine, currently at 1.41, mild metabolic acidosis with bicarb at 21, T. bili 3.5, alkaline phosphatase 133 and some improvement in AST and ALT.  Preliminary blood cultures negative in 12 hours. ERCP with concern of acute cholecystitis, no definitive choledocholithiasis. GI for possible ERCP today. Cardiology cleared for surgery, Eliquis is being held.  Current plan is for cholecystectomy on Monday.    Assessment and Plan: * Acute cholecystitis due to biliary calculus General Surgery and GI is on board.   Going for ERCP today. Likely will go to the OR tomorrow. -Continue with ceftriaxone and Flagyl -Continue with supportive care  Permanent atrial fibrillation (HCC) -Continue home Coreg -Holding Eliquis for surgical intervention -Continue to monitor  Chronic diastolic CHF preserved EF 55 to 60% (congestive heart failure) (HCC) History of Takotsubo cardiomyopathy Cardiology clearance was obtained for surgery -Continue Coreg and restarting Entresto and spironolactone  Chronic pain syndrome Chronic osteoarthritis of bilateral knee -Continue home Norco 4 times daily as needed.  CKD (chronic kidney disease), stage II Renal function seems stable. -Monitor renal function -Avoid nephrotoxin  Ovarian mass, left CT abdomen pelvis showed incidental finding of 4.4 cm solid lobulated mass of the left ovary concern for benign versus ovarian neoplasm.  Radiology recommended gynecology consult. Transabdominal pelvic ultrasound did not show any mass -CA125. - Consulted inpatient gynecology Dr. Charlotta Newton   Subjective: Patient continued to have some right upper quadrant pain.  Physical Exam: Vitals:   12/14/23 1300 12/14/23 1315 12/14/23 1330 12/14/23 1353  BP: (!) 157/61 (!) 159/55 (!) 162/65 (!) 166/72  Pulse: (!) 59 (!) 41 (!) 47 (!) 40  Resp: 14 16 12 16   Temp:   97.7 F (36.5 C) 97.8 F (36.6 C)  TempSrc:    Oral  SpO2: 95% 99% 95% 93%  Weight:       General.  Frail elderly lady, in no acute distress. Pulmonary.  Lungs clear bilaterally, normal respiratory effort. CV.  Regular rate and rhythm, no JVD, rub or murmur. Abdomen.  Soft, RUQ tenderness, nondistended, BS positive. CNS.  Alert and oriented .  No focal neurologic deficit. Extremities.  No edema, no cyanosis, pulses intact and symmetrical.  Data Reviewed: Prior data reviewed  Family Communication: Discussed with daughter at bedside  Disposition: Status is: Inpatient Remains  inpatient appropriate because: Severity of  illness  Planned Discharge Destination: Home  Time spent: 50 minutes  This record has been created using Conservation officer, historic buildings. Errors have been sought and corrected,but may not always be located. Such creation errors do not reflect on the standard of care.   Author: Arnetha Courser, MD 12/14/2023 4:17 PM  For on call review [email or web address].

## 2023-12-14 NOTE — Transfer of Care (Signed)
Immediate Anesthesia Transfer of Care Note  Patient: Anna [name]  Procedure(s) Performed: UPPER ENDOSCOPIC ULTRASOUND (EUS) LINEAR ESOPHAGOGASTRODUODENOSCOPY (EGD) WITH PROPOFOL  Patient Location: PACU  Anesthesia Type:General  Level of Consciousness: awake  Airway & Oxygen Therapy: Patient Spontanous Breathing  Post-op Assessment: Report given to RN and Post -op Vital signs reviewed and stable  Post vital signs: Reviewed and stable  Last Vitals:  Vitals Value Taken Time  BP 151/77 12/14/23 1249  Temp    Pulse 53 12/14/23 1253  Resp 15 12/14/23 1253  SpO2 93 % 12/14/23 1253  Vitals shown include unfiled device data.  Last Pain:  Vitals:   12/14/23 1121  TempSrc: Temporal  PainSc: 0-No pain      Patients Stated Pain Goal: 0 (12/14/23 0949)  Complications: No notable events documented.

## 2023-12-14 NOTE — Plan of Care (Signed)
  Problem: Education: Goal: Knowledge of General Education information will improve Description: Including pain rating scale, medication(s)/side effects and non-pharmacologic comfort measures Outcome: Progressing   Problem: Health Behavior/Discharge Planning: Goal: Ability to manage health-related needs will improve Outcome: Progressing   Problem: Clinical Measurements: Goal: Diagnostic test results will improve Outcome: Progressing   Problem: Coping: Goal: Level of anxiety will decrease Outcome: Progressing   

## 2023-12-14 NOTE — Assessment & Plan Note (Signed)
Renal function seems stable. -Monitor renal function -Avoid nephrotoxin

## 2023-12-14 NOTE — Hospital Course (Signed)
Taken from H&P.  Anna [name] is a 88 y.o. female with medical history significant of permanent atrial fibrillation, diastolic heart failure preserved EF 55 to 60%, prediabetes, CKD stage II, stress-induced cardiomyopathy, mitral regurgitation, aortic regurgitation, lumbar radiculopathy, osteoarthritis, history of vertebral fracture currently following pain management presented to emergency department complaining of right upper quadrant abdominal pain, poor urinary output and constipation. She denies any fever, chill, nausea, vomiting and diarrhea.  On presentation had significantly elevated blood pressure at 206/92 which was later improved.  CBC unremarkable, CMP with creatinine at 1.15, T. bili 1.7, AST 47, normal lipase at 25. CT abdomen and pelvis with concern of acute calculus cholecystitis with mild intra and extrahepatic biliary ductal dilation.  Also noted to have a stable 4.4 cm solid lobulated left ovarian mass with recommendation to follow-up with gynecology.  CA125 was ordered  RUQ ultrasound with dilated gallbladder, layering of stone, slight wall thickening.  Gastroenterology and general surgery was consulted.  Started on ceftriaxone and Flagyl.  2/16: Hemodynamically stable, progressively worsening creatinine, currently at 1.41, mild metabolic acidosis with bicarb at 21, T. bili 3.5, alkaline phosphatase 133 and some improvement in AST and ALT.  Preliminary blood cultures negative in 12 hours. ERCP with concern of acute cholecystitis, no definitive choledocholithiasis. GI for possible ERCP today. Cardiology cleared for surgery, Eliquis is being held.  Current plan is for cholecystectomy on Monday.  2/17: Hemodynamically stable, slight worsening of alkaline phosphatase to 162, T. bili at 2, improving creatinine at 1.21.  Going for laparoscopic cholecystectomy today.  2/18: Vital stable.  S/p laparoscopic cholecystectomy, from surgical standpoint she is stable and they cleared for  discharge.  Labs with mild hyponatremia at 132 and worsening bicarb at 17, alkaline phosphatase 174 and T. bili 1.6, slight worsening of creatinine at 1.51-giving some more IV fluid.  PT/OT recommended home health.  Patient wants to go home today due to upcoming inclement weather.  Agree to stay to get some IV fluid till later in the afternoon and then go home.  Surgery discontinued antibiotics, no need to continue at this time.  Patient already had pain medications which are being managed by pain management so no new prescriptions were provided.  She was giving some Zofran to use as needed.  Patient was instructed to keep herself well-hydrated, talked with granddaughter who will be her caregiver.  She will continue on current medications and follow-up with her providers closely for further management.

## 2023-12-14 NOTE — Assessment & Plan Note (Signed)
Chronic osteoarthritis of bilateral knee -Continue home Norco 4 times daily as needed.

## 2023-12-14 NOTE — Assessment & Plan Note (Addendum)
History of Takotsubo cardiomyopathy Cardiology clearance was obtained for surgery -Continue Coreg and restarting Entresto and spironolactone

## 2023-12-14 NOTE — Progress Notes (Signed)
Subjective/Chief Complaint: Pt with better abd pain Labs noted GI for possible ERCP today   Objective: Vital signs in last 24 hours: Temp:  [97.5 F (36.4 C)-98.5 F (36.9 C)] 97.6 F (36.4 C) (02/16 0740) Pulse Rate:  [49-64] 64 (02/16 0740) Resp:  [15-21] 18 (02/16 0740) BP: (112-148)/(41-64) 140/55 (02/16 0740) SpO2:  [91 %-96 %] 93 % (02/16 0740) Weight:  [72 kg-73.5 kg] 73.5 kg (02/16 0659) Last BM Date : 12/11/23  Intake/Output from previous day: 02/15 0701 - 02/16 0700 In: 1230.5 [I.V.:817.3; IV Piggyback:413.2] Out: -  Intake/Output this shift: No intake/output data recorded.  PE:  Constitutional: No acute distress, conversant, appears states age. Eyes: Anicteric sclerae, moist conjunctiva, no lid lag Lungs: Clear to auscultation bilaterally, normal respiratory effort CV: regular rate and rhythm, no murmurs, no peripheral edema, pedal pulses 2+ GI: Soft, no masses or hepatosplenomegaly, tender to palpation epigastrum and RUQ Skin: No rashes, palpation reveals normal turgor Psychiatric: appropriate judgment and insight, oriented to person, place, and time   Lab Results:  Recent Labs    12/13/23 0509 12/14/23 0243  WBC 12.7* 5.2  HGB 10.3* 9.1*  HCT 32.5* 27.8*  PLT 207 159   BMET Recent Labs    12/13/23 0509 12/14/23 0243  NA 132* 135  K 4.3 4.1  CL 98 99  CO2 22 21*  GLUCOSE 141* 75  BUN 17 20  CREATININE 1.23* 1.41*  CALCIUM 9.3 9.7   PT/INR No results for input(s): "LABPROT", "INR" in the last 72 hours. ABG No results for input(s): "PHART", "HCO3" in the last 72 hours.  Invalid input(s): "PCO2", "PO2"  Studies/Results: US PELVIS (TRANSABDOMINAL ONLY) Result Date: 12/13/2023 CLINICAL DATA:  [id number] Cyst of left ovary [id number] EXAM: TRANSABDOMINAL ULTRASOUND OF PELVIS TECHNIQUE: Transabdominal ultrasound examination of the pelvis was performed including evaluation of the uterus, ovaries, adnexal regions, and pelvic cul-de-sac.  COMPARISON:  MRI abdomen from earlier the same day and CT scan abdomen and pelvis from yesterday 12/12/2023. FINDINGS: Transvaginal exam could not be performed as patient was unable to tolerate/positioned to do the transvaginal exam. Uterus Measurements: 2.1 x 2.8 x 7.4 cm = volume: 22.8 mL. Small/atrophic uterus exhibiting diffuse myometrial calcifications, within normal limits for patient's age. No fibroids or other mass visualized. Endometrium Thickness: Not well seen but measures less than 2 mm in thickness. No discrete focal abnormality visualized. Right ovary Not visualized.  No large adnexal mass seen. Left ovary Not visualized. No large adnexal mass seen. Other findings:  No abnormal free fluid. IMPRESSION: 1. Limited transabdominal exam. 2. Bilateral ovaries are not visualized. No large adnexal mass seen on the provided images. 3. Small/atrophic uterus exhibiting myometrial calcifications, within normal limits for patient's age. No focal mass. Electronically Signed   By: Jules Schick M.D.   On: 12/13/2023 08:42   MR ABDOMEN MRCP W WO CONTAST Result Date: 12/13/2023 CLINICAL DATA:  88 year old female with history of acute cholecystitis. Biliary tract dilatation noted on prior CT examination. EXAM: MRI ABDOMEN WITHOUT AND WITH CONTRAST (INCLUDING MRCP) TECHNIQUE: Multiplanar multisequence MR imaging of the abdomen was performed both before and after the administration of intravenous contrast. Heavily T2-weighted images of the biliary and pancreatic ducts were obtained, and three-dimensional MRCP images were rendered by post processing. CONTRAST:  8mL GADAVIST GADOBUTROL 1 MMOL/ML IV SOLN COMPARISON:  None Available. FINDINGS: Comment: Portions of today's examination are limited by substantial patient respiratory motion. Lower chest: Cardiomegaly with biatrial dilatation. Trace bilateral pleural effusions. Hepatobiliary: No definite suspicious  cystic or solid hepatic lesions. Gallbladder is severely  distended. Amorphous T2 hypointense material lying dependently in the gallbladder likely reflects biliary sludge and/or tiny gallstones. Gallbladder wall appears thickened and edematous with trace volume of pericholecystic fluid. MRCP images are severely limited by patient respiratory motion, essentially nondiagnostic. With this limitation in mind, no definite intra or extrahepatic biliary ductal dilatation is noted on today's examination. Pancreas: No pancreatic mass. No definite pancreatic ductal dilatation noted. No peripancreatic fluid collections or inflammatory changes. Spleen:  Unremarkable. Adrenals/Urinary Tract: T1 hypointense, T2 hyperintense, nonenhancing lesions in the right kidney measuring up to 2.6 cm in the upper pole, compatible with simple (Bosniak class 1) cysts (no imaging follow-up recommended). No hydroureteronephrosis in the visualized portions of the abdomen. Bilateral adrenal glands are normal in appearance. Stomach/Bowel: Visualized portions are unremarkable. Vascular/Lymphatic: No aneurysm identified in the visualized abdominal vasculature. No lymphadenopathy noted in the abdomen. Other: No significant volume of ascites noted in the visualized portions of the peritoneal cavity. Musculoskeletal: No aggressive appearing osseous lesions are noted in the visualized portions of the skeleton. IMPRESSION: 1. Study is severely limited by patient respiratory motion. Despite the limitations of today's examination, the findings are highly concerning for acute cholecystitis. Surgical consultation is recommended. 2. No definite choledocholithiasis or findings of biliary tract obstruction. 3. Cardiomegaly with biatrial dilatation. Electronically Signed   By: Trudie Reed M.D.   On: 12/13/2023 06:40   CT ABDOMEN PELVIS W CONTRAST Result Date: 12/12/2023 CLINICAL DATA:  Abdominal pain, acute (Ped 0-17y). Right upper quadrant abdominal pain EXAM: CT ABDOMEN AND PELVIS WITH CONTRAST TECHNIQUE:  Multidetector CT imaging of the abdomen and pelvis was performed using the standard protocol following bolus administration of intravenous contrast. RADIATION DOSE REDUCTION: This exam was performed according to the departmental dose-optimization program which includes automated exposure control, adjustment of the mA and/or kV according to patient size and/or use of iterative reconstruction technique. CONTRAST:  75mL OMNIPAQUE IOHEXOL 350 MG/ML SOLN COMPARISON:  Sonogram 3:58 p.m. FINDINGS: Lower chest: Mild interstitial pulmonary edema. Mild global cardiomegaly with particular right atrial enlargement. Hepatobiliary: Cholelithiasis noted. The gallbladder is dilated and there is pericholecystic inflammatory stranding and trace free fluid in keeping with changes of acute calculus cholecystitis. There is mild intra and extrahepatic biliary ductal dilation to the level of the ampulla, however, a distal obstructing lesion or intraluminal stone is not identified on this limited examination. The liver is otherwise unremarkable. Pancreas: Atrophic but otherwise unremarkable. Spleen: Unremarkable Adrenals/Urinary Tract: The adrenal glands are unremarkable. The kidneys are normal in position. Moderate bilateral renal cortical atrophy with asymmetric cortical scarring involving the upper pole the right kidney. No enhancing intrarenal mass. Multiple simple cortical cysts are seen within the right kidney for which no follow-up imaging is recommended. No hydronephrosis. No intrarenal or ureteral calculi. The bladder is unremarkable. Stomach/Bowel: Severe sigmoid diverticulosis. The stomach, small bowel, and large bowel are otherwise unremarkable. Appendix normal. No free intraperitoneal gas or fluid. Vascular/Lymphatic: Extensive aortoiliac atherosclerotic calcification. No aortic aneurysm. Shotty periportal adenopathy is nonspecific, possibly reactive in nature. No frankly pathologic adenopathy within the abdomen and pelvis.  Reproductive: The left ovary is replaced with a heterogeneously enhancing solid lobulated mass measuring 2.6 x 4.4 cm, stable since prior exam when measured in similar fashion. Uterus and right adnexa are unremarkable. Other: No abdominal wall hernia Musculoskeletal: Osseous structures are age-appropriate. Remote T12 compression deformity is unchanged without associated retrolisthesis or retropulsion. No acute bone abnormality. IMPRESSION: 1. Acute calculus cholecystitis. 2. Mild intra and  extrahepatic biliary ductal dilation to the level of the ampulla, however, a distal obstructing lesion or intraluminal stone is not identified on this limited examination. Correlation with liver function tests may be helpful. If indicated, this could be further assessed with MRCP or intraoperative cholangiography. 3. Severe sigmoid diverticulosis. 4. Stable 4.4 cm solid lobulated mass replacing the left ovary. While this may represent a benign ovarian neoplasm, gynecologic consultation is recommended for further management. 5. Mild global cardiomegaly with particular right atrial enlargement. Mild interstitial pulmonary edema. Aortic Atherosclerosis (ICD10-I70.0). Electronically Signed   By: Helyn Numbers M.D.   On: 12/12/2023 20:08   US Abdomen Limited RUQ (LIVER/GB) Result Date: 12/12/2023 CLINICAL DATA:  Right upper quadrant abdominal pain EXAM: ULTRASOUND ABDOMEN LIMITED RIGHT UPPER QUADRANT COMPARISON:  CT 08/17/2022. FINDINGS: Gallbladder: Dilated gallbladder. Slight wall thickening of 4 mm. Layering small stones. No adjacent fluid. No reported sonographic Murphy's sign. Common bile duct: Diameter: Common duct upper 9 mm, upper limits of normal for the patient's the patient's age. Liver: No focal lesion identified. Within normal limits in parenchymal echogenicity. Portal vein is patent on color Doppler imaging with normal direction of blood flow towards the liver. Other: Note is made of a right-sided renal cyst which  appears simple measuring up to 2.7 cm. Small right-sided pleural effusion. IMPRESSION: Dilated gallbladder with layering stones. Slight wall thickening. Please correlate for other clinical findings of acute cholecystitis and if confirmatory study is needed, HIDA scan may be of some benefit. Common duct at 9 mm, upper limits of normal for the the patient's age. Small right-sided pleural effusion. Electronically Signed   By: Karen Kays M.D.   On: 12/12/2023 16:31    Anti-infectives: Anti-infectives (From admission, onward)    Start     Dose/Rate Route Frequency Ordered Stop   12/13/23 2200  cefTRIAXone (ROCEPHIN) 2 g in sodium chloride 0.9 % 100 mL IVPB        2 g 200 mL/hr over 30 Minutes Intravenous Every 24 hours 12/13/23 0306 12/20/23 2159   12/13/23 0315  metroNIDAZOLE (FLAGYL) IVPB 500 mg        500 mg 100 mL/hr over 60 Minutes Intravenous Every 12 hours 12/13/23 0306 12/20/23 0314   12/13/23 0200  cefTRIAXone (ROCEPHIN) 2 g in sodium chloride 0.9 % 100 mL IVPB        2 g 200 mL/hr over 30 Minutes Intravenous  Once 12/13/23 0149 12/13/23 0236   12/13/23 0200  azithromycin (ZITHROMAX) 500 mg in sodium chloride 0.9 % 250 mL IVPB        500 mg 250 mL/hr over 60 Minutes Intravenous  Once 12/13/23 0149 12/13/23 0414       Assessment/Plan: 58f Acute cholecystitis  probable choledocholithiasis   Possible ERCP today per Dr. Elnoria Howard Cards cleared for surgery.  Eliquis on hold Will likely plano n OR Monday if pt tol ERCP well Following      LOS: 1 day    Axel Filler 12/14/2023

## 2023-12-14 NOTE — Assessment & Plan Note (Addendum)
CT abdomen pelvis showed incidental finding of 4.4 cm solid lobulated mass of the left ovary concern for benign versus ovarian neoplasm.  Radiology recommended gynecology consult. Transabdominal pelvic ultrasound did not show any mass -CA125. - Consulted inpatient gynecology Dr. Charlotta Newton

## 2023-12-14 NOTE — Op Note (Signed)
[facility] Patient Name: Anna [name] Procedure Date : 12/14/2023 MRN: [id number] Attending MD: Jeani Hawking , MD, [id number] Date of Birth: [birth year] CSN: [id number] Age: 88 Admit Type: Inpatient Procedure:                Upper EUS Indications:              Common bile duct dilation (acquired) seen on CT scan Providers:                Jeani Hawking, MD, Fransisca Connors, Eliberto Ivory, RN,                            Rozetta Nunnery, Technician Referring MD:              Medicines:                Propofol per Anesthesia Complications:            No immediate complications. Estimated Blood Loss:     Estimated blood loss: none. Procedure:                Pre-Anesthesia Assessment:                           - Prior to the procedure, a History and Physical                            was performed, and patient medications and                            allergies were reviewed. The patient's tolerance of                            previous anesthesia was also reviewed. The risks                            and benefits of the procedure and the sedation                            options and risks were discussed with the patient.                            All questions were answered, and informed consent                            was obtained. Prior Anticoagulants: The patient has                            taken Eliquis (apixaban), last dose was 1 day prior                            to procedure. ASA Grade Assessment: III - A patient                            with severe systemic disease. After reviewing the  risks and benefits, the patient was deemed in                            satisfactory condition to undergo the procedure.                           - Sedation was administered by an anesthesia                            professional. Deep sedation was attained.                           After obtaining informed consent, the endoscope was                             passed under direct vision. Throughout the                            procedure, the patient's blood pressure, pulse, and                            oxygen saturations were monitored continuously. The                            [id number] ([id number] Olympus linear ultrasound scope                            was introduced through the mouth, and advanced to                            the second part of duodenum. The upper EUS was                            accomplished without difficulty. The patient                            tolerated the procedure well. Scope In: Scope Out: Findings:      ENDOSONOGRAPHIC FINDING: :      Moderate hyperechoic material consistent with sludge was visualized       endosonographically in the gallbladder body. The gallbladder wall was       thickened at 4.1-4.6 mm consistent with her diagnosis of acute       cholecystitis. The CBD was clearly visualized and it was tracked in its       entirety. There was NO evidence of any stones or sludge. The CBD was       measured to be 3 mm in average. A duodenal diverticulum was noted in the       ampullary region. Impression:               - Hyperechoic material consistent with sludge was                            visualized endosonographically in the gallbladder  body as well as a thickened gallbladder wall. This                            was consistent with an acute cholecystitis.                           - No specimens collected. Recommendation:           - Return patient to hospital ward for ongoing care.                           - Clear liquid diet.                           - Lap chole per Surgery. Procedure Code(s):        --- Professional ---                           [phone], Esophagogastroduodenoscopy, flexible,                            transoral; with endoscopic ultrasound examination                            limited to the esophagus, stomach or duodenum, and                             adjacent structures Diagnosis Code(s):        --- Professional ---                           K83.8, Other specified diseases of biliary tract CPT copyright 2022 American Medical Association. All rights reserved. The codes documented in this report are preliminary and upon coder review may  be revised to meet current compliance requirements. Jeani Hawking, MD Jeani Hawking, MD 12/14/2023 12:52:37 PM This report has been signed electronically. Number of Addenda: 0

## 2023-12-14 NOTE — Assessment & Plan Note (Signed)
-  Continue home Coreg -Holding Eliquis for surgical intervention -Continue to monitor

## 2023-12-14 NOTE — Anesthesia Postprocedure Evaluation (Signed)
Anesthesia Post Note  Patient: Anna [name]  Procedure(s) Performed: UPPER ENDOSCOPIC ULTRASOUND (EUS) LINEAR ESOPHAGOGASTRODUODENOSCOPY (EGD) WITH PROPOFOL     Patient location during evaluation: PACU Anesthesia Type: General Level of consciousness: awake and alert Pain management: pain level controlled Vital Signs Assessment: post-procedure vital signs reviewed and stable Respiratory status: spontaneous breathing, nonlabored ventilation and respiratory function stable Cardiovascular status: blood pressure returned to baseline and stable Postop Assessment: no apparent nausea or vomiting Anesthetic complications: no  No notable events documented.  Last Vitals:  Vitals:   12/14/23 1330 12/14/23 1353  BP: (!) 162/65 (!) 166/72  Pulse: (!) 47 (!) 40  Resp: 12   Temp: 36.5 C   SpO2: 95% 93%    Last Pain:  Vitals:   12/14/23 1330  TempSrc:   PainSc: 0-No pain                 Nyron Mozer,W. EDMOND

## 2023-12-14 NOTE — Interval H&P Note (Signed)
History and Physical Interval Note:  12/14/2023 11:22 AM  Anna [name]  has presented today for surgery, with the diagnosis of choledocholithiasis.  The various methods of treatment have been discussed with the patient and family. After consideration of risks, benefits and other options for treatment, the patient has consented to  Procedure(s): UPPER ENDOSCOPIC ULTRASOUND (EUS) LINEAR (N/A) ENDOSCOPIC RETROGRADE CHOLANGIOPANCREATOGRAPHY (ERCP) (N/A) as a surgical intervention.  The patient's history has been reviewed, patient examined, no change in status, stable for surgery.  I have reviewed the patient's chart and labs.  Questions were answered to the patient's satisfaction.     Ramonita Koenig D

## 2023-12-14 NOTE — Plan of Care (Signed)

## 2023-12-14 NOTE — Assessment & Plan Note (Signed)
General Surgery and GI is on board.  Going for ERCP today. Likely will go to the OR tomorrow. -Continue with ceftriaxone and Flagyl -Continue with supportive care

## 2023-12-14 NOTE — Anesthesia Procedure Notes (Addendum)
Procedure Name: Intubation Date/Time: 12/14/2023 11:59 AM  Performed by: Dairl Ponder, CRNAPre-anesthesia Checklist: Patient identified, Emergency Drugs available, Suction available and Patient being monitored Patient Re-evaluated:Patient Re-evaluated prior to induction Oxygen Delivery Method: Circle System Utilized Preoxygenation: Pre-oxygenation with 100% oxygen Induction Type: IV induction Ventilation: Mask ventilation without difficulty Laryngoscope Size: Mac and 3 Grade View: Grade I Tube type: Oral Tube size: 7.0 mm Number of attempts: 1 Airway Equipment and Method: Stylet and Oral airway Placement Confirmation: ETT inserted through vocal cords under direct vision, positive ETCO2 and breath sounds checked- equal and bilateral Secured at: 22 cm Tube secured with: Tape Dental Injury: Teeth and Oropharynx as per pre-operative assessment

## 2023-12-15 ENCOUNTER — Other Ambulatory Visit: Payer: Self-pay

## 2023-12-15 ENCOUNTER — Encounter (HOSPITAL_COMMUNITY): Payer: Self-pay | Admitting: Internal Medicine

## 2023-12-15 ENCOUNTER — Encounter (HOSPITAL_COMMUNITY)
Admission: EM | Disposition: A | Discharge status: Home Health | Payer: Self-pay | Source: Home / Self Care | Attending: Internal Medicine

## 2023-12-15 ENCOUNTER — Inpatient Hospital Stay (HOSPITAL_COMMUNITY): Payer: 59

## 2023-12-15 DIAGNOSIS — I4821 Permanent atrial fibrillation: Secondary | ICD-10-CM | POA: Diagnosis not present

## 2023-12-15 DIAGNOSIS — I5032 Chronic diastolic (congestive) heart failure: Secondary | ICD-10-CM | POA: Diagnosis not present

## 2023-12-15 DIAGNOSIS — I2510 Atherosclerotic heart disease of native coronary artery without angina pectoris: Secondary | ICD-10-CM

## 2023-12-15 DIAGNOSIS — I5181 Takotsubo syndrome: Secondary | ICD-10-CM | POA: Diagnosis not present

## 2023-12-15 DIAGNOSIS — I110 Hypertensive heart disease with heart failure: Secondary | ICD-10-CM

## 2023-12-15 DIAGNOSIS — K8000 Calculus of gallbladder with acute cholecystitis without obstruction: Secondary | ICD-10-CM | POA: Diagnosis not present

## 2023-12-15 DIAGNOSIS — N1831 Chronic kidney disease, stage 3a: Secondary | ICD-10-CM

## 2023-12-15 DIAGNOSIS — K819 Cholecystitis, unspecified: Secondary | ICD-10-CM

## 2023-12-15 DIAGNOSIS — I509 Heart failure, unspecified: Secondary | ICD-10-CM

## 2023-12-15 DIAGNOSIS — I5022 Chronic systolic (congestive) heart failure: Secondary | ICD-10-CM

## 2023-12-15 HISTORY — PX: CHOLECYSTECTOMY: SHX55

## 2023-12-15 LAB — CBC WITH DIFFERENTIAL/PLATELET
Abs Immature Granulocytes: 0.01 10*3/uL (ref 0.00–0.07)
Basophils Absolute: 0 10*3/uL (ref 0.0–0.1)
Basophils Relative: 1 %
Eosinophils Absolute: 0.2 10*3/uL (ref 0.0–0.5)
Eosinophils Relative: 4 %
HCT: 30.9 % — ABNORMAL LOW (ref 36.0–46.0)
Hemoglobin: 10.1 g/dL — ABNORMAL LOW (ref 12.0–15.0)
Immature Granulocytes: 0 %
Lymphocytes Relative: 21 %
Lymphs Abs: 1.1 10*3/uL (ref 0.7–4.0)
MCH: 28.9 pg (ref 26.0–34.0)
MCHC: 32.7 g/dL (ref 30.0–36.0)
MCV: 88.3 fL (ref 80.0–100.0)
Monocytes Absolute: 0.6 10*3/uL (ref 0.1–1.0)
Monocytes Relative: 11 %
Neutro Abs: 3.3 10*3/uL (ref 1.7–7.7)
Neutrophils Relative %: 63 %
Platelets: 180 10*3/uL (ref 150–400)
RBC: 3.5 MIL/uL — ABNORMAL LOW (ref 3.87–5.11)
RDW: 13.9 % (ref 11.5–15.5)
WBC: 5.2 10*3/uL (ref 4.0–10.5)
nRBC: 0 % (ref 0.0–0.2)

## 2023-12-15 LAB — COMPREHENSIVE METABOLIC PANEL
ALT: 39 U/L (ref 0–44)
AST: 51 U/L — ABNORMAL HIGH (ref 15–41)
Albumin: 2.9 g/dL — ABNORMAL LOW (ref 3.5–5.0)
Alkaline Phosphatase: 162 U/L — ABNORMAL HIGH (ref 38–126)
Anion gap: 11 (ref 5–15)
BUN: 22 mg/dL (ref 8–23)
CO2: 21 mmol/L — ABNORMAL LOW (ref 22–32)
Calcium: 9.1 mg/dL (ref 8.9–10.3)
Chloride: 102 mmol/L (ref 98–111)
Creatinine, Ser: 1.21 mg/dL — ABNORMAL HIGH (ref 0.44–1.00)
GFR, Estimated: 44 mL/min — ABNORMAL LOW (ref >60)
Glucose, Bld: 90 mg/dL (ref 70–99)
Potassium: 3.9 mmol/L (ref 3.5–5.1)
Sodium: 134 mmol/L — ABNORMAL LOW (ref 135–145)
Total Bilirubin: 2 mg/dL — ABNORMAL HIGH (ref 0.0–1.2)
Total Protein: 5.7 g/dL — ABNORMAL LOW (ref 6.5–8.1)

## 2023-12-15 LAB — CA 125: Cancer Antigen (CA) 125: 21.4 U/mL (ref 0.0–38.1)

## 2023-12-15 SURGERY — LAPAROSCOPIC CHOLECYSTECTOMY
Anesthesia: General | Site: Abdomen

## 2023-12-15 MED ORDER — PROPOFOL 10 MG/ML IV BOLUS
INTRAVENOUS | Status: DC | PRN
  Administered 2023-12-15: 60 mg via INTRAVENOUS

## 2023-12-15 MED ORDER — CHLORHEXIDINE GLUCONATE 0.12 % MT SOLN: 15.0000 mL | Freq: Once | OROMUCOSAL | Status: AC

## 2023-12-15 MED ORDER — CHLORHEXIDINE GLUCONATE CLOTH 2 % EX PADS
6.0000 | MEDICATED_PAD | Freq: Once | CUTANEOUS | Status: AC
  Administered 2023-12-14: 6 via TOPICAL

## 2023-12-15 MED ORDER — FENTANYL CITRATE (PF) 250 MCG/5ML IJ SOLN
INTRAMUSCULAR | Status: AC
  Filled 2023-12-15: qty 5

## 2023-12-15 MED ORDER — SODIUM CHLORIDE 0.9 % IR SOLN
Status: DC | PRN
  Administered 2023-12-15: 1000 mL

## 2023-12-15 MED ORDER — PROPOFOL 10 MG/ML IV BOLUS
INTRAVENOUS | Status: AC
  Filled 2023-12-15: qty 20

## 2023-12-15 MED ORDER — HYDRALAZINE HCL 20 MG/ML IJ SOLN
10.0000 mg | INTRAMUSCULAR | Status: DC | PRN
  Administered 2023-12-15 (×2): 10 mg via INTRAVENOUS
  Filled 2023-12-15: qty 1

## 2023-12-15 MED ORDER — BUPIVACAINE-EPINEPHRINE 0.25% -1:200000 IJ SOLN
INTRAMUSCULAR | Status: DC | PRN
Start: 2023-12-15 — End: 2023-12-15
  Administered 2023-12-15: 20 mL

## 2023-12-15 MED ORDER — ACETAMINOPHEN 500 MG PO TABS
ORAL_TABLET | ORAL | Status: AC
  Administered 2023-12-15: 1000 mg via ORAL
  Filled 2023-12-15: qty 2

## 2023-12-15 MED ORDER — CEFAZOLIN SODIUM-DEXTROSE 2-3 GM-%(50ML) IV SOLR
INTRAVENOUS | Status: DC | PRN
  Administered 2023-12-15: 2 g via INTRAVENOUS

## 2023-12-15 MED ORDER — ROCURONIUM BROMIDE 10 MG/ML (PF) SYRINGE
PREFILLED_SYRINGE | INTRAVENOUS | Status: DC | PRN
  Administered 2023-12-15: 30 mg via INTRAVENOUS

## 2023-12-15 MED ORDER — INDOCYANINE GREEN 25 MG IV SOLR
2.5000 mg | Freq: Once | INTRAVENOUS | Status: AC
  Administered 2023-12-15: 2.5 mg via INTRAVENOUS

## 2023-12-15 MED ORDER — SUGAMMADEX SODIUM 200 MG/2ML IV SOLN
INTRAVENOUS | Status: DC | PRN
  Administered 2023-12-15: 200 mg via INTRAVENOUS

## 2023-12-15 MED ORDER — FENTANYL CITRATE (PF) 100 MCG/2ML IJ SOLN
INTRAMUSCULAR | Status: AC
  Filled 2023-12-15: qty 2

## 2023-12-15 MED ORDER — ONDANSETRON HCL 4 MG/2ML IJ SOLN
INTRAMUSCULAR | Status: AC
  Filled 2023-12-15: qty 2

## 2023-12-15 MED ORDER — CEFAZOLIN SODIUM 1 G IJ SOLR
INTRAMUSCULAR | Status: AC
  Filled 2023-12-15: qty 20

## 2023-12-15 MED ORDER — DEXAMETHASONE SODIUM PHOSPHATE 10 MG/ML IJ SOLN
INTRAMUSCULAR | Status: DC | PRN
  Administered 2023-12-15: 5 mg via INTRAVENOUS

## 2023-12-15 MED ORDER — ONDANSETRON HCL 4 MG/2ML IJ SOLN
INTRAMUSCULAR | Status: DC | PRN
  Administered 2023-12-15: 4 mg via INTRAVENOUS

## 2023-12-15 MED ORDER — LIDOCAINE 2% (20 MG/ML) 5 ML SYRINGE
INTRAMUSCULAR | Status: AC
  Filled 2023-12-15: qty 5

## 2023-12-15 MED ORDER — LACTATED RINGERS IV SOLN: INTRAVENOUS | Status: DC

## 2023-12-15 MED ORDER — HYDRALAZINE HCL 20 MG/ML IJ SOLN
INTRAMUSCULAR | Status: AC
  Filled 2023-12-15: qty 1

## 2023-12-15 MED ORDER — LIDOCAINE 2% (20 MG/ML) 5 ML SYRINGE
INTRAMUSCULAR | Status: DC | PRN
  Administered 2023-12-15: 40 mg via INTRAVENOUS

## 2023-12-15 MED ORDER — PHENYLEPHRINE 80 MCG/ML (10ML) SYRINGE FOR IV PUSH (FOR BLOOD PRESSURE SUPPORT)
PREFILLED_SYRINGE | INTRAVENOUS | Status: AC
  Filled 2023-12-15: qty 10

## 2023-12-15 MED ORDER — ACETAMINOPHEN 500 MG PO TABS: 1000.0000 mg | ORAL_TABLET | Freq: Once | ORAL | Status: AC

## 2023-12-15 MED ORDER — PHENYLEPHRINE 80 MCG/ML (10ML) SYRINGE FOR IV PUSH (FOR BLOOD PRESSURE SUPPORT)
PREFILLED_SYRINGE | INTRAVENOUS | Status: DC | PRN
Start: 2023-12-15 — End: 2023-12-15
  Administered 2023-12-15: 80 ug via INTRAVENOUS

## 2023-12-15 MED ORDER — ROCURONIUM BROMIDE 10 MG/ML (PF) SYRINGE
PREFILLED_SYRINGE | INTRAVENOUS | Status: AC
  Filled 2023-12-15: qty 10

## 2023-12-15 MED ORDER — CHLORHEXIDINE GLUCONATE CLOTH 2 % EX PADS
6.0000 | MEDICATED_PAD | Freq: Once | CUTANEOUS | Status: AC
  Administered 2023-12-15: 6 via TOPICAL

## 2023-12-15 MED ORDER — ORAL CARE MOUTH RINSE: 15.0000 mL | Freq: Once | OROMUCOSAL | Status: AC

## 2023-12-15 MED ORDER — BUPIVACAINE-EPINEPHRINE (PF) 0.25% -1:200000 IJ SOLN
INTRAMUSCULAR | Status: AC
  Filled 2023-12-15: qty 30

## 2023-12-15 MED ORDER — FENTANYL CITRATE (PF) 100 MCG/2ML IJ SOLN
25.0000 ug | INTRAMUSCULAR | Status: DC | PRN
  Administered 2023-12-15 (×2): 25 ug via INTRAVENOUS
  Administered 2023-12-15: 50 ug via INTRAVENOUS

## 2023-12-15 MED ORDER — DEXAMETHASONE SODIUM PHOSPHATE 10 MG/ML IJ SOLN
INTRAMUSCULAR | Status: AC
  Filled 2023-12-15: qty 1

## 2023-12-15 MED ORDER — FENTANYL CITRATE (PF) 250 MCG/5ML IJ SOLN
INTRAMUSCULAR | Status: DC | PRN
  Administered 2023-12-15 (×2): 50 ug via INTRAVENOUS

## 2023-12-15 MED ORDER — 0.9 % SODIUM CHLORIDE (POUR BTL) OPTIME
TOPICAL | Status: DC | PRN
  Administered 2023-12-15: 1000 mL

## 2023-12-15 MED ORDER — CHLORHEXIDINE GLUCONATE 0.12 % MT SOLN
OROMUCOSAL | Status: AC
  Administered 2023-12-15: 15 mL via OROMUCOSAL
  Filled 2023-12-15: qty 15

## 2023-12-15 SURGICAL SUPPLY — 32 items
APPLIER CLIP 5 13 M/L LIGAMAX5 (MISCELLANEOUS) ×1
BAG COUNTER SPONGE SURGICOUNT (BAG) ×1 IMPLANT
CANISTER SUCT 3000ML PPV (MISCELLANEOUS) ×1 IMPLANT
CHLORAPREP W/TINT 26 (MISCELLANEOUS) ×1 IMPLANT
CLIP APPLIE 5 13 M/L LIGAMAX5 (MISCELLANEOUS) ×1 IMPLANT
COVER SURGICAL LIGHT HANDLE (MISCELLANEOUS) ×1 IMPLANT
DERMABOND ADVANCED .7 DNX12 (GAUZE/BANDAGES/DRESSINGS) ×1 IMPLANT
ELECT REM PT RETURN 9FT ADLT (ELECTROSURGICAL) ×1
ELECTRODE REM PT RTRN 9FT ADLT (ELECTROSURGICAL) ×1 IMPLANT
GLOVE SURG SIGNA 7.5 PF LTX (GLOVE) ×1 IMPLANT
GOWN STRL REUS W/ TWL LRG LVL3 (GOWN DISPOSABLE) ×2 IMPLANT
GOWN STRL REUS W/ TWL XL LVL3 (GOWN DISPOSABLE) ×1 IMPLANT
IRRIG SUCT STRYKERFLOW 2 WTIP (MISCELLANEOUS) ×1
IRRIGATION SUCT STRKRFLW 2 WTP (MISCELLANEOUS) ×1 IMPLANT
KIT BASIN OR (CUSTOM PROCEDURE TRAY) ×1 IMPLANT
KIT TURNOVER KIT B (KITS) ×1 IMPLANT
NS IRRIG 1000ML POUR BTL (IV SOLUTION) ×1 IMPLANT
PAD ARMBOARD 7.5X6 YLW CONV (MISCELLANEOUS) ×1 IMPLANT
SCISSORS LAP 5X35 DISP (ENDOMECHANICALS) ×1 IMPLANT
SET TUBE SMOKE EVAC HIGH FLOW (TUBING) ×1 IMPLANT
SLEEVE Z-THREAD 5X100MM (TROCAR) ×2 IMPLANT
SPECIMEN JAR SMALL (MISCELLANEOUS) ×1 IMPLANT
SUT MNCRL AB 4-0 PS2 18 (SUTURE) ×1 IMPLANT
SYS BAG RETRIEVAL 10MM (BASKET) ×1
SYSTEM BAG RETRIEVAL 10MM (BASKET) ×1 IMPLANT
TOWEL GREEN STERILE (TOWEL DISPOSABLE) ×1 IMPLANT
TOWEL GREEN STERILE FF (TOWEL DISPOSABLE) ×1 IMPLANT
TRAY LAPAROSCOPIC MC (CUSTOM PROCEDURE TRAY) ×1 IMPLANT
TROCAR BALLN 12MMX100 BLUNT (TROCAR) ×1 IMPLANT
TROCAR Z-THREAD OPTICAL 5X100M (TROCAR) ×1 IMPLANT
WARMER LAPAROSCOPE (MISCELLANEOUS) ×1 IMPLANT
WATER STERILE IRR 1000ML POUR (IV SOLUTION) ×1 IMPLANT

## 2023-12-15 NOTE — Op Note (Signed)
LAPAROSCOPIC CHOLECYSTECTOMY  Procedure Note  Anna [name] 12/15/2023   Pre-op Diagnosis: cholecystitis with cholelithiasis     Post-op Diagnosis: same  Procedure(s): LAPAROSCOPIC CHOLECYSTECTOMY  Surgeon(s): Abigail Miyamoto, MD  Anesthesia: General  Staff:  Circulator: Rogers Seeds, RN Scrub Person: Virgel Bouquet, RN; Carmela Rima  Estimated Blood Loss: Minimal               Specimens: sent to path  Findings: The patient was found of a very large, floppy, thick-walled gallbladder consistent with chronic cholecystitis.  I can visualize the left ovary which appeared polycystic.  No other intra-abdominal pathology was identified  Procedure: The patient was brought to the operating identifies correct patient.  She is placed upon the operating table and general anesthesia was induced.  Her abdomen was prepped and draped in the usual sterile fashion.  Using a #15 blade, I made a small incision vertical above the umbilicus.  I carried this down to the fascia which was then opened with a scalpel.  I then gained entrance to the peritoneal cavity under direct vision.  A 0 Vicryl pursing suture was placed around the fascial opening.  The [place] port was placed through the opening and insufflation of the abdomen was began.  I next placed a 5 mm trocar in the patient's epigastrium and 2 more the right upper quadrant all under direct vision.  The gallbladder is found to be large and floppy and thick-walled.  Adhesions to the gallbladder were taken down both bluntly with electrocautery.  A small bridging vein over the top of the cystic duct was identified and clipped proximally distally and transected.  The cystic duct was then identified and a critical window was achieved around it.  It was clipped 3 times proximally once distally and transected.  The cystic artery and posterior branch were then identified and clipped proximally distally and transected as well.  The gallbladder was then  slowly dissected free from the liver bed with electrocautery.  Once it was free from the liver bed I achieved hemostasis with the cautery.  I then placed the gallbladder in an Endosac and removed it through the incision at the umbilicus.  I next visualized the left ovary.  It was only slightly enlarged and was polycystic in appearance.  There were no pelvic or peritoneal nodules to suggest malignancy.  At this point I again evaluate the liver bed and hemostasis was achieved.  All trocars were then removed under direct vision and the abdomen was deflated.  The 0 Vicryl at the umbilicus was tied in place closing the fascial defect.  All incisions were then anesthetized Marcaine and closed with 4-0 Monocryl sutures.  Dermabond was then applied.  The patient tolerated the procedure well.  All the counts were correct at the end of the procedure.  The patient was then extubated in the operating room and taken in a stable condition to the recovery room.          Abigail Miyamoto   Date: 12/15/2023  Time: 1:53 PM

## 2023-12-15 NOTE — Transfer of Care (Signed)
Immediate Anesthesia Transfer of Care Note  Patient: Anna [name]  Procedure(s) Performed: LAPAROSCOPIC CHOLECYSTECTOMY (Abdomen)  Patient Location: PACU  Anesthesia Type:General  Level of Consciousness: awake and drowsy  Airway & Oxygen Therapy: Patient Spontanous Breathing and Patient connected to face mask oxygen  Post-op Assessment: Report given to RN, Post -op Vital signs reviewed and stable, and Patient moving all extremities X 4  Post vital signs: Reviewed and stable  Last Vitals:  Vitals Value Taken Time  BP 161/55 12/15/23 1409  Temp    Pulse 61 12/15/23 1412  Resp 18 12/15/23 1412  SpO2 98 % 12/15/23 1412  Vitals shown include unfiled device data.  Last Pain:  Vitals:   12/15/23 1200  TempSrc: Oral  PainSc: 0-No pain      Patients Stated Pain Goal: 0 (12/14/23 0949)  Complications: No notable events documented.

## 2023-12-15 NOTE — Anesthesia Procedure Notes (Signed)
Procedure Name: Intubation Date/Time: 12/15/2023 1:15 PM  Performed by: Eulah Pont, CRNAPre-anesthesia Checklist: Patient identified, Emergency Drugs available, Suction available and Patient being monitored Patient Re-evaluated:Patient Re-evaluated prior to induction Oxygen Delivery Method: Circle System Utilized Preoxygenation: Pre-oxygenation with 100% oxygen Induction Type: IV induction Ventilation: Mask ventilation without difficulty Laryngoscope Size: Miller and 2 Grade View: Grade I Tube type: Oral Tube size: 7.0 mm Number of attempts: 1 Airway Equipment and Method: Stylet and Oral airway Placement Confirmation: ETT inserted through vocal cords under direct vision, positive ETCO2 and breath sounds checked- equal and bilateral Secured at: 21 cm Tube secured with: Tape Dental Injury: Teeth and Oropharynx as per pre-operative assessment

## 2023-12-15 NOTE — Assessment & Plan Note (Signed)
General Surgery and GI is on board.   Going for laparoscopic cholecystectomy today -Continue with ceftriaxone and Flagyl -Continue with supportive care

## 2023-12-15 NOTE — Anesthesia Preprocedure Evaluation (Addendum)
Anesthesia Evaluation  Patient identified by MRN, date of birth, ID band Patient awake    Reviewed: Allergy & Precautions, NPO status , Patient's Chart, lab work & pertinent test results, reviewed documented beta blocker date and time   Airway Mallampati: III  TM Distance: >3 FB Neck ROM: Full    Dental  (+) Edentulous Upper, Edentulous Lower, Dental Advisory Given   Pulmonary neg pulmonary ROS   Pulmonary exam normal breath sounds clear to auscultation       Cardiovascular hypertension, Pt. on home beta blockers and Pt. on medications + CAD, + Past MI and +CHF  Normal cardiovascular exam+ dysrhythmias (eliquis) Atrial Fibrillation + Valvular Problems/Murmurs (mild MS)  Rhythm:Regular Rate:Normal  TTE 2024 1. Left ventricular ejection fraction, by estimation, is 55 to 60%. The  left ventricle has normal function. The left ventricle has no regional  wall motion abnormalities. Left ventricular diastolic parameters are  indeterminate. Elevated left ventricular  end-diastolic pressure.   2. Right ventricular systolic function is normal. The right ventricular  size is normal. There is normal pulmonary artery systolic pressure.   3. Left atrial size was severely dilated.   4. Right atrial size was severely dilated.   5. The mitral valve is degenerative. Trivial mitral valve regurgitation.  Mild mitral stenosis. The mean mitral valve gradient is 4.8 mmHg. Moderate  mitral annular calcification.   6. The aortic valve is normal in structure. Aortic valve regurgitation is  not visualized. No aortic stenosis is present.   7. The inferior vena cava is dilated in size with >50% respiratory  variability, suggesting right atrial pressure of 8 mmHg.   Cath   LV end diastolic pressure is mildly elevated.  LVEDP 20 mm Hg.   There is no aortic valve stenosis.   Mild, nonobstructive CAD. Likely Takotsubo cardiomyopathy based on the RWMA noted  on echo     Neuro/Psych negative neurological ROS  negative psych ROS   GI/Hepatic negative GI ROS, Neg liver ROS,,,  Endo/Other  negative endocrine ROS    Renal/GU Renal InsufficiencyRenal diseaseLab Results      Component                Value               Date                      NA                       134 (L)             12/15/2023                CL                       102                 12/15/2023                K                        3.9                 12/15/2023                CO2  21 (L)              12/15/2023                BUN                      22                  12/15/2023                CREATININE               1.21 (H)            12/15/2023                GFRNONAA                 44 (L)              12/15/2023                CALCIUM                  9.1                 12/15/2023                PHOS                     4.6                 08/21/2022                ALBUMIN                  2.9 (L)             12/15/2023                GLUCOSE                  90                  12/15/2023             negative genitourinary   Musculoskeletal  (+) Arthritis ,    Abdominal   Peds  Hematology  (+) Blood dyscrasia, anemia   Anesthesia Other Findings   Reproductive/Obstetrics                             Anesthesia Physical Anesthesia Plan  ASA: 3  Anesthesia Plan: General   Post-op Pain Management: Tylenol PO (pre-op)*   Induction: Intravenous  PONV Risk Score and Plan: 3 and Dexamethasone, Ondansetron and Treatment may vary due to age or medical condition  Airway Management Planned: Oral ETT  Additional Equipment:   Intra-op Plan:   Post-operative Plan: Extubation in OR  Informed Consent: I have reviewed the patients History and Physical, chart, labs and discussed the procedure including the risks, benefits and alternatives for the proposed anesthesia with the patient or authorized  representative who has indicated his/her understanding and acceptance.     Dental advisory given  Plan Discussed with: CRNA  Anesthesia Plan Comments:        Anesthesia Quick Evaluation

## 2023-12-15 NOTE — Assessment & Plan Note (Signed)
CT abdomen pelvis showed incidental finding of 4.4 cm solid lobulated mass of the left ovary concern for benign versus ovarian neoplasm.  Radiology recommended gynecology consult. Transabdominal pelvic ultrasound did not show any mass -CA125-normal - Consulted inpatient gynecology Dr. Charlotta Newton

## 2023-12-15 NOTE — Progress Notes (Signed)
Patient ID: Anna [name], female   DOB: 1936-05-12, 88 y.o.   MRN: [id number]   Pre Procedure note for inpatients:   Anna [name] has been scheduled for LAPAROSCOPIC CHOLECYSTECTOMY WITH ICG CHOLANGIOGRAPHY today. I discussed the procedure in detail.  We discussed the risks and benefits of a laparoscopic cholecystectomy and possible cholangiogram including, but not limited to bleeding, infection, injury to surrounding structures such as the intestine or liver, bile leak, retained gallstones, need to convert to an open procedure, prolonged diarrhea, blood clots such as  DVT, common bile duct injury, anesthesia risks, and possible need for additional procedures. We discussed the typical post-operative recovery course.   The patient has been seen and labs reviewed. There are no changes in the patient's condition to prevent proceeding with the planned procedure today.  Recent labs:  Lab Results  Component Value Date   WBC 5.2 12/15/2023   HGB 10.1 (L) 12/15/2023   HCT 30.9 (L) 12/15/2023   PLT 180 12/15/2023   GLUCOSE 90 12/15/2023   CHOL 100 10/02/2023   TRIG 71 10/02/2023   HDL 44 10/02/2023   LDLCALC 41 10/02/2023   ALT 39 12/15/2023   AST 51 (H) 12/15/2023   NA 134 (L) 12/15/2023   K 3.9 12/15/2023   CL 102 12/15/2023   CREATININE 1.21 (H) 12/15/2023   BUN 22 12/15/2023   CO2 21 (L) 12/15/2023   TSH 0.317 (L) 08/23/2022   HGBA1C 6.1 10/02/2023    Abigail Miyamoto, MD 12/15/2023 11:20 AM

## 2023-12-15 NOTE — Assessment & Plan Note (Addendum)
Mild AKI on admission, creatinine seems improving. -Monitor renal function -Avoid nephrotoxin

## 2023-12-15 NOTE — Progress Notes (Addendum)
Progress Note   Patient: Anna [name] ZOX:[id number] DOB: Mar 07, 1936 DOA: 12/12/2023     2 DOS: the patient was seen and examined on 12/15/2023   Brief hospital course: Taken from H&P.  Anna [name] is a 88 y.o. female with medical history significant of permanent atrial fibrillation, diastolic heart failure preserved EF 55 to 60%, prediabetes, CKD stage II, stress-induced cardiomyopathy, mitral regurgitation, aortic regurgitation, lumbar radiculopathy, osteoarthritis, history of vertebral fracture currently following pain management presented to emergency department complaining of right upper quadrant abdominal pain, poor urinary output and constipation. She denies any fever, chill, nausea, vomiting and diarrhea.  On presentation had significantly elevated blood pressure at 206/92 which was later improved.  CBC unremarkable, CMP with creatinine at 1.15, T. bili 1.7, AST 47, normal lipase at 25. CT abdomen and pelvis with concern of acute calculus cholecystitis with mild intra and extrahepatic biliary ductal dilation.  Also noted to have a stable 4.4 cm solid lobulated left ovarian mass with recommendation to follow-up with gynecology.  CA125 was ordered  RUQ ultrasound with dilated gallbladder, layering of stone, slight wall thickening.  Gastroenterology and general surgery was consulted.  Started on ceftriaxone and Flagyl.  2/16: Hemodynamically stable, progressively worsening creatinine, currently at 1.41, mild metabolic acidosis with bicarb at 21, T. bili 3.5, alkaline phosphatase 133 and some improvement in AST and ALT.  Preliminary blood cultures negative in 12 hours. ERCP with concern of acute cholecystitis, no definitive choledocholithiasis. GI for possible ERCP today. Cardiology cleared for surgery, Eliquis is being held.  Current plan is for cholecystectomy on Monday.  2/17: Hemodynamically stable, slight worsening of alkaline phosphatase to 162, T. bili at 2, improving creatinine  at 1.21.  Going for laparoscopic cholecystectomy today.   Assessment and Plan: * Acute cholecystitis due to biliary calculus General Surgery and GI is on board.   Going for laparoscopic cholecystectomy today -Continue with ceftriaxone and Flagyl -Continue with supportive care  Permanent atrial fibrillation (HCC) -Continue home Coreg -Holding Eliquis for surgical intervention -Continue to monitor  Chronic diastolic CHF preserved EF 55 to 60% (congestive heart failure) (HCC) History of Takotsubo cardiomyopathy Cardiology clearance was obtained for surgery -Continue Coreg and restarting Entresto and spironolactone  Chronic pain syndrome Chronic osteoarthritis of bilateral knee -Continue home Norco 4 times daily as needed.  CKD (chronic kidney disease), stage II Mild AKI on admission, creatinine seems improving. -Monitor renal function -Avoid nephrotoxin  Ovarian mass, left CT abdomen pelvis showed incidental finding of 4.4 cm solid lobulated mass of the left ovary concern for benign versus ovarian neoplasm.  Radiology recommended gynecology consult. Transabdominal pelvic ultrasound did not show any mass -CA125-normal - Consulted inpatient gynecology Dr. Charlotta Newton   Subjective: Patient was seen and examined today.  No obvious abdominal pain but RUQ remain mildly tender.  Awaiting surgery.  Physical Exam: Vitals:   12/15/23 1409 12/15/23 1415 12/15/23 1434 12/15/23 1439  BP: (!) 161/55 (!) 161/61 (!) 184/57   Pulse: 62 65    Resp: 17 18    Temp: 97.7 F (36.5 C)   (!) 97.3 F (36.3 C)  TempSrc:      SpO2: 98% 97%    Weight:       General.  Frail elderly lady, in no acute distress. Pulmonary.  Lungs clear bilaterally, normal respiratory effort. CV.  Regular rate and rhythm, no JVD, rub or murmur. Abdomen.  Soft, mild RUQ tenderness, nondistended, BS positive. CNS.  Alert and oriented .  No focal neurologic deficit. Extremities.  No edema, no cyanosis, pulses intact and  symmetrical.  Data Reviewed: Prior data reviewed  Family Communication:   Disposition: Status is: Inpatient Remains inpatient appropriate because: Severity of illness  Planned Discharge Destination: Home  Time spent: 45 minutes  This record has been created using Conservation officer, historic buildings. Errors have been sought and corrected,but may not always be located. Such creation errors do not reflect on the standard of care.   Author: Arnetha Courser, MD 12/15/2023 2:56 PM  For on call review [email or web address].

## 2023-12-15 NOTE — Plan of Care (Signed)

## 2023-12-15 NOTE — Anesthesia Postprocedure Evaluation (Signed)
Anesthesia Post Note  Patient: Anna [name]  Procedure(s) Performed: LAPAROSCOPIC CHOLECYSTECTOMY (Abdomen)     Patient location during evaluation: PACU Anesthesia Type: General Level of consciousness: awake and alert Pain management: pain level controlled Vital Signs Assessment: post-procedure vital signs reviewed and stable Respiratory status: spontaneous breathing, nonlabored ventilation, respiratory function stable and patient connected to nasal cannula oxygen Cardiovascular status: blood pressure returned to baseline and stable Postop Assessment: no apparent nausea or vomiting Anesthetic complications: no  No notable events documented.  Last Vitals:  Vitals:   12/15/23 1551 12/15/23 1600  BP:    Pulse: (!) 49 (!) 50  Resp:    Temp:    SpO2: 94% 94%    Last Pain:  Vitals:   12/15/23 1551  TempSrc:   PainSc: Asleep   Pain Goal: Patients Stated Pain Goal: 0 (12/14/23 0949)                 Mancel Lardizabal L Delane Stalling

## 2023-12-16 ENCOUNTER — Encounter (HOSPITAL_COMMUNITY): Payer: Self-pay | Admitting: Surgery

## 2023-12-16 DIAGNOSIS — K819 Cholecystitis, unspecified: Secondary | ICD-10-CM

## 2023-12-16 DIAGNOSIS — I5032 Chronic diastolic (congestive) heart failure: Secondary | ICD-10-CM | POA: Diagnosis not present

## 2023-12-16 DIAGNOSIS — I4821 Permanent atrial fibrillation: Secondary | ICD-10-CM | POA: Diagnosis not present

## 2023-12-16 DIAGNOSIS — Z7189 Other specified counseling: Secondary | ICD-10-CM | POA: Diagnosis not present

## 2023-12-16 DIAGNOSIS — K8000 Calculus of gallbladder with acute cholecystitis without obstruction: Secondary | ICD-10-CM | POA: Diagnosis not present

## 2023-12-16 DIAGNOSIS — N179 Acute kidney failure, unspecified: Secondary | ICD-10-CM

## 2023-12-16 DIAGNOSIS — N189 Chronic kidney disease, unspecified: Secondary | ICD-10-CM

## 2023-12-16 DIAGNOSIS — I5181 Takotsubo syndrome: Secondary | ICD-10-CM | POA: Diagnosis not present

## 2023-12-16 DIAGNOSIS — Z515 Encounter for palliative care: Secondary | ICD-10-CM

## 2023-12-16 DIAGNOSIS — N182 Chronic kidney disease, stage 2 (mild): Secondary | ICD-10-CM | POA: Diagnosis not present

## 2023-12-16 LAB — COMPREHENSIVE METABOLIC PANEL
ALT: 31 U/L (ref 0–44)
AST: 49 U/L — ABNORMAL HIGH (ref 15–41)
Albumin: 3 g/dL — ABNORMAL LOW (ref 3.5–5.0)
Alkaline Phosphatase: 174 U/L — ABNORMAL HIGH (ref 38–126)
Anion gap: 14 (ref 5–15)
BUN: 23 mg/dL (ref 8–23)
CO2: 17 mmol/L — ABNORMAL LOW (ref 22–32)
Calcium: 9.2 mg/dL (ref 8.9–10.3)
Chloride: 101 mmol/L (ref 98–111)
Creatinine, Ser: 1.51 mg/dL — ABNORMAL HIGH (ref 0.44–1.00)
GFR, Estimated: 33 mL/min — ABNORMAL LOW (ref >60)
Glucose, Bld: 147 mg/dL — ABNORMAL HIGH (ref 70–99)
Potassium: 4.5 mmol/L (ref 3.5–5.1)
Sodium: 132 mmol/L — ABNORMAL LOW (ref 135–145)
Total Bilirubin: 1.6 mg/dL — ABNORMAL HIGH (ref 0.0–1.2)
Total Protein: 6.1 g/dL — ABNORMAL LOW (ref 6.5–8.1)

## 2023-12-16 LAB — CBC WITH DIFFERENTIAL/PLATELET
Abs Immature Granulocytes: 0.04 10*3/uL (ref 0.00–0.07)
Basophils Absolute: 0 10*3/uL (ref 0.0–0.1)
Basophils Relative: 0 %
Eosinophils Absolute: 0 10*3/uL (ref 0.0–0.5)
Eosinophils Relative: 0 %
HCT: 36.5 % (ref 36.0–46.0)
Hemoglobin: 11.9 g/dL — ABNORMAL LOW (ref 12.0–15.0)
Immature Granulocytes: 1 %
Lymphocytes Relative: 10 %
Lymphs Abs: 0.7 10*3/uL (ref 0.7–4.0)
MCH: 29 pg (ref 26.0–34.0)
MCHC: 32.6 g/dL (ref 30.0–36.0)
MCV: 88.8 fL (ref 80.0–100.0)
Monocytes Absolute: 0.3 10*3/uL (ref 0.1–1.0)
Monocytes Relative: 4 %
Neutro Abs: 6.6 10*3/uL (ref 1.7–7.7)
Neutrophils Relative %: 85 %
Platelets: 238 10*3/uL (ref 150–400)
RBC: 4.11 MIL/uL (ref 3.87–5.11)
RDW: 14.4 % (ref 11.5–15.5)
WBC: 7.6 10*3/uL (ref 4.0–10.5)
nRBC: 0 % (ref 0.0–0.2)

## 2023-12-16 MED ORDER — ETOMIDATE 2 MG/ML IV SOLN
INTRAVENOUS | Status: AC
  Filled 2023-12-16: qty 10

## 2023-12-16 MED ORDER — LACTATED RINGERS IV SOLN: INTRAVENOUS | Status: DC

## 2023-12-16 MED ORDER — ONDANSETRON HCL 4 MG PO TABS: 4.0000 mg | ORAL_TABLET | Freq: Four times a day (QID) | ORAL | 0 refills | Status: AC | PRN

## 2023-12-16 NOTE — Plan of Care (Signed)
   Problem: Education: Goal: Knowledge of General Education information will improve Description Including pain rating scale, medication(s)/side effects and non-pharmacologic comfort measures Outcome: Progressing   Problem: Health Behavior/Discharge Planning: Goal: Ability to manage health-related needs will improve Outcome: Progressing

## 2023-12-16 NOTE — Discharge Instructions (Signed)

## 2023-12-16 NOTE — Evaluation (Signed)
Physical Therapy Evaluation Patient Details Name: Anna [name] MRN: [id number] DOB: 12-09-1935 Today's Date: 12/16/2023  History of Present Illness  88 y.o. female presents to [facility] hospital on 12/13/2023 with RUQ pain and urinary retention. CT abdomen with acute calculus cholecystitis. Upper EUS on 2/16. Laparoscopic cholecystectomy on 2/17. PMH includes PAF, diastolic heart failure, prediabetes, CKD II, cardiomyopathy, MR, OA.  Clinical Impression  Pt presents to PT with deficits in functional mobility, gait, balance, strength, power, ROM. Pt reports having bilateral knee OA, and now with increased stiffness and weakness 2/2 immobility during this hospitalization. Pt requires assistance to stand and to transfer out of bed to a recliner, demonstrating a persistent posterior lean in standing. PT was able to contact the pt's daughter, Elease Hashimoto, who reports the pt does have physical assistance available to transfer to her wheelchair. Pt and family hoping to discharge home. HHPT will be beneficial at this time.        If plan is discharge home, recommend the following: A little help with walking and/or transfers;A little help with bathing/dressing/bathroom;Assistance with cooking/housework;Assist for transportation;Help with stairs or ramp for entrance   Can travel by private vehicle        Equipment Recommendations None recommended by PT  Recommendations for Other Services       Functional Status Assessment Patient has had a recent decline in their functional status and demonstrates the ability to make significant improvements in function in a reasonable and predictable amount of time.     Precautions / Restrictions Precautions Precautions: Fall Recall of Precautions/Restrictions: Intact Precaution/Restrictions Comments: bilateral knee OA Restrictions Weight Bearing Restrictions Per Provider Order: No      Mobility  Bed Mobility Overal bed mobility: Needs Assistance Bed Mobility:  Supine to Sit     Supine to sit: Min assist, HOB elevated, Used rails          Transfers Overall transfer level: Needs assistance Equipment used: Rolling walker (2 wheels), 1 person hand held assist Transfers: Sit to/from Stand, Bed to chair/wheelchair/BSC Sit to Stand: Mod assist     Squat pivot transfers: Min assist     General transfer comment: modA to stand with support of RW for 4 attempts. minA swaut pivot from bed to recliner with minA    Ambulation/Gait             Pre-gait activities: pt takes 2 shuffling steps forward and backward from bedside. maxA for backward steps due to posterior lean and instability    Stairs            Wheelchair Mobility     Tilt Bed    Modified Rankin (Stroke Patients Only)       Balance Overall balance assessment: Needs assistance Sitting-balance support: No upper extremity supported, Feet supported Sitting balance-Leahy Scale: Fair     Standing balance support: Bilateral upper extremity supported, Reliant on assistive device for balance Standing balance-Leahy Scale: Poor                               Pertinent Vitals/Pain Pain Assessment Pain Assessment: Faces Faces Pain Scale: Hurts little more Pain Location: knees Pain Descriptors / Indicators: Aching Pain Intervention(s): Monitored during session    Home Living Family/patient expects to be discharged to:: Private residence Living Arrangements: Children Available Help at Discharge: Family;Personal care attendant (aide from 9-1 6 days a week, assist with walking) Type of Home: House Home Access: Ramped entrance  Home Layout: One level Home Equipment: Agricultural consultant (2 wheels);BSC/3in1;Wheelchair - manual;Grab bars - toilet;Grab bars - tub/shower;Hand held shower head      Prior Function Prior Level of Function : Needs assist             Mobility Comments: pt independently transfers to a manual wheelchair and is able to  mobilize within the home at wheelchair level. Pt does ambulate with support of RW and wheelchair follow when her aide is present. ADLs Comments: pt has PRN assistance from aide for bathing and dressing. Pt reports managing her own meds. Assist for transportation. Enjoys shopping     Extremity/Trunk Assessment   Upper Extremity Assessment Upper Extremity Assessment: Generalized weakness    Lower Extremity Assessment Lower Extremity Assessment: Generalized weakness    Cervical / Trunk Assessment Cervical / Trunk Assessment: Kyphotic  Communication   Communication Communication: No apparent difficulties    Cognition Arousal: Alert Behavior During Therapy: WFL for tasks assessed/performed   PT - Cognitive impairments: Awareness                         Following commands: Intact       Cueing Cueing Techniques: Verbal cues     General Comments General comments (skin integrity, edema, etc.): VSS on RA    Exercises General Exercises - Lower Extremity Ankle Circles/Pumps: AROM, Both, 5 reps Heel Slides: AROM, Both, 5 reps   Assessment/Plan    PT Assessment Patient needs continued PT services  PT Problem List Decreased strength;Decreased range of motion;Decreased activity tolerance;Decreased balance;Decreased mobility;Decreased knowledge of use of DME;Decreased safety awareness       PT Treatment Interventions DME instruction;Gait training;Therapeutic activities;Functional mobility training;Therapeutic exercise;Balance training;Neuromuscular re-education;Patient/family education;Wheelchair mobility training    PT Goals (Current goals can be found in the Care Plan section)  Acute Rehab PT Goals Patient Stated Goal: to return home PT Goal Formulation: With patient Time For Goal Achievement: 12/30/23 Potential to Achieve Goals: Fair    Frequency Min 1X/week     Co-evaluation               AM-PAC PT "6 Clicks" Mobility  Outcome Measure Help needed  turning from your back to your side while in a flat bed without using bedrails?: A Little Help needed moving from lying on your back to sitting on the side of a flat bed without using bedrails?: A Little Help needed moving to and from a bed to a chair (including a wheelchair)?: A Little Help needed standing up from a chair using your arms (e.g., wheelchair or bedside chair)?: A Lot Help needed to walk in hospital room?: Total Help needed climbing 3-5 steps with a railing? : Total 6 Click Score: 13    End of Session Equipment Utilized During Treatment: Gait belt Activity Tolerance: Patient tolerated treatment well Patient left: in chair;with call bell/phone within reach;with chair alarm set Nurse Communication: Mobility status PT Visit Diagnosis: Other abnormalities of gait and mobility (R26.89);Muscle weakness (generalized) (M62.81)    Time: 1610-9604 PT Time Calculation (min) (ACUTE ONLY): 34 min   Charges:   PT Evaluation $PT Eval Low Complexity: 1 Low   PT General Charges $$ ACUTE PT VISIT: 1 Visit         Arlyss Gandy, PT, DPT Acute Rehabilitation Office [phone]   Arlyss Gandy 12/16/2023, 10:51 AM

## 2023-12-16 NOTE — Discharge Summary (Signed)
Physician Discharge Summary   Patient: Anna [name] MRN: [id number] DOB: 1936/01/30  Admit date:     12/12/2023  Discharge date: 12/16/23  Discharge Physician: Arnetha Courser   PCP: Marcine Matar, MD   Recommendations at discharge:  Please obtain CBC and CMP on follow-up Follow-up with primary care provider Follow-up with general surgery  Discharge Diagnoses: Principal Problem:   Acute cholecystitis due to biliary calculus Active Problems:   Permanent atrial fibrillation (HCC)   Takotsubo cardiomyopathy   Chronic diastolic CHF preserved EF 55 to 60% (congestive heart failure) (HCC)   Chronic pain syndrome   CKD (chronic kidney disease), stage II   Stage 3a chronic kidney disease (HCC)   Ovarian mass, left   Acute cholecystitis   Hospital Course: Taken from H&P.  Anna [name] is a 88 y.o. female with medical history significant of permanent atrial fibrillation, diastolic heart failure preserved EF 55 to 60%, prediabetes, CKD stage II, stress-induced cardiomyopathy, mitral regurgitation, aortic regurgitation, lumbar radiculopathy, osteoarthritis, history of vertebral fracture currently following pain management presented to emergency department complaining of right upper quadrant abdominal pain, poor urinary output and constipation. She denies any fever, chill, nausea, vomiting and diarrhea.  On presentation had significantly elevated blood pressure at 206/92 which was later improved.  CBC unremarkable, CMP with creatinine at 1.15, T. bili 1.7, AST 47, normal lipase at 25. CT abdomen and pelvis with concern of acute calculus cholecystitis with mild intra and extrahepatic biliary ductal dilation.  Also noted to have a stable 4.4 cm solid lobulated left ovarian mass with recommendation to follow-up with gynecology.  CA125 was ordered  RUQ ultrasound with dilated gallbladder, layering of stone, slight wall thickening.  Gastroenterology and general surgery was consulted.   Started on ceftriaxone and Flagyl.  2/16: Hemodynamically stable, progressively worsening creatinine, currently at 1.41, mild metabolic acidosis with bicarb at 21, T. bili 3.5, alkaline phosphatase 133 and some improvement in AST and ALT.  Preliminary blood cultures negative in 12 hours. ERCP with concern of acute cholecystitis, no definitive choledocholithiasis. GI for possible ERCP today. Cardiology cleared for surgery, Eliquis is being held.  Current plan is for cholecystectomy on Monday.  2/17: Hemodynamically stable, slight worsening of alkaline phosphatase to 162, T. bili at 2, improving creatinine at 1.21.  Going for laparoscopic cholecystectomy today.  2/18: Vital stable.  S/p laparoscopic cholecystectomy, from surgical standpoint she is stable and they cleared for discharge.  Labs with mild hyponatremia at 132 and worsening bicarb at 17, alkaline phosphatase 174 and T. bili 1.6, slight worsening of creatinine at 1.51-giving some more IV fluid.  PT/OT recommended home health.  Patient wants to go home today due to upcoming inclement weather.  Agree to stay to get some IV fluid till later in the afternoon and then go home.  Surgery discontinued antibiotics, no need to continue at this time.  Patient already had pain medications which are being managed by pain management so no new prescriptions were provided.  She was giving some Zofran to use as needed.  Patient was instructed to keep herself well-hydrated, talked with granddaughter who will be her caregiver.  She will continue on current medications and follow-up with her providers closely for further management.  Assessment and Plan: * Acute cholecystitis due to biliary calculus General Surgery and GI is on board.   Going for laparoscopic cholecystectomy today -Received Flagyl and ceftriaxone which were discontinued on 2/18 and no need for further antibiotics per surgery -Continue with supportive care  Permanent atrial fibrillation  (HCC) -Continue home Coreg -Can resume Eliquis at home -Continue to monitor  Chronic diastolic CHF preserved EF 55 to 60% (congestive heart failure) (HCC) History of Takotsubo cardiomyopathy Cardiology clearance was obtained for surgery -Continue Coreg and restarting Entresto and spironolactone  Chronic pain syndrome Chronic osteoarthritis of bilateral knee -Continue home Norco 4 times daily as needed.  CKD (chronic kidney disease), stage II Mild AKI on admission, creatinine seems improving. -Monitor renal function -Avoid nephrotoxin  Ovarian mass, left CT abdomen pelvis showed incidental finding of 4.4 cm solid lobulated mass of the left ovary concern for benign versus ovarian neoplasm.  Radiology recommended gynecology consult. Transabdominal pelvic ultrasound did not show any mass -CA125-normal Surgeon did look for ovaries during cholecystectomy and they appear as polycystic ovaries.  Patient can follow-up with outpatient gynecology for further assistance.   Pain control - [employer] Controlled Substance Reporting System database was reviewed. and patient was instructed, not to drive, operate heavy machinery, perform activities at heights, swimming or participation in water activities or provide baby-sitting services while on Pain, Sleep and Anxiety Medications; until their outpatient Physician has advised to do so again. Also recommended to not to take more than prescribed Pain, Sleep and Anxiety Medications.  Consultants: General Surgery Procedures performed: Laparoscopic cholecystectomy Disposition: Home health Diet recommendation:  Discharge Diet Orders (From admission, onward)     Start     Ordered   12/16/23 0000  Diet - low sodium heart healthy        12/16/23 1303           Cardiac diet DISCHARGE MEDICATION: Allergies as of 12/16/2023   No Known Allergies      Medication List     TAKE these medications    carvedilol 3.125 MG tablet Commonly known  as: COREG Take 1 tablet (3.125 mg total) by mouth 2 (two) times daily with a meal.   cyclobenzaprine 10 MG tablet Commonly known as: FLEXERIL Take 0.5 tablets (5 mg total) by mouth 2 (two) times daily as needed for muscle spasms.   diclofenac Sodium 1 % Gel Commonly known as: VOLTAREN Apply 2 grams to knees 4 times a day as needed. What changed:  how much to take how to take this when to take this reasons to take this   docusate sodium 100 MG capsule Commonly known as: COLACE Take 100 mg by mouth daily.   Eliquis 5 MG Tabs tablet Generic drug: apixaban TAKE 1 TABLET BY MOUTH TWICE A DAY   Entresto 24-26 MG Generic drug: sacubitril-valsartan TAKE 1 TABLET BY MOUTH TWICE A DAY   Farxiga 10 MG Tabs tablet Generic drug: dapagliflozin propanediol TAKE 1 TABLET BY MOUTH EVERY DAY   ferrous sulfate 325 (65 FE) MG tablet TAKE 1 TABLET BY MOUTH EVERY DAY WITH BREAKFAST   FISH OIL CONCENTRATE PO Take 1 capsule by mouth daily.   furosemide 20 MG tablet Commonly known as: LASIX Take 1 tablet (20 mg total) by mouth as needed. What changed: reasons to take this   HYDROcodone-acetaminophen 7.5-325 MG tablet Commonly known as: NORCO Take 1 tablet by mouth 4 (four) times daily as needed.   Misc. Tax inspector. Devices Misc Rollator walker   Misc. Devices Misc Hospital bed   naloxone 4 MG/0.1ML Liqd nasal spray kit Commonly known as: NARCAN Place 1 spray into the nose once.   ondansetron 4 MG tablet Commonly known as: ZOFRAN Take 1 tablet (4 mg total) by  mouth every 6 (six) hours as needed for nausea.   rosuvastatin 10 MG tablet Commonly known as: CRESTOR TAKE 1 TABLET BY MOUTH EVERY MONDAY, WEDNESDAY, FRIDAY, AND SATURDAY   spironolactone 25 MG tablet Commonly known as: ALDACTONE TAKE 1/2 TABLET BY MOUTH EVERY DAY   Systane Nighttime Oint Place 1 drop into the left eye 4 (four) times daily.   Vitamin D3 10 MCG (400 UNIT) tablet Take  2 tablets (800 Units total) by mouth daily.        Follow-up Information     Maczis, Hedda Slade, PA-C Follow up on 01/08/2024.   Specialty: General Surgery Why: 3:45pm, Arrive 30 minutes prior to your appointment time, Please bring your insurance card and photo ID Contact information: [address] [place] [address] [facility] [place] [address] [phone]         Marcine Matar, MD. Schedule an appointment as soon as possible for a visit in 1 week(s).   Specialty: Internal Medicine Contact information: [address] [place] [address] [phone]                Discharge Exam: Ceasar Mons Weights   12/14/23 7253 12/15/23 0437 12/16/23 0428  Weight: 73.5 kg 73 kg 73.8 kg   General.  Frail elderly lady, in no acute distress. Pulmonary.  Lungs clear bilaterally, normal respiratory effort. CV.  Regular rate and rhythm, no JVD, rub or murmur. Abdomen.  Soft, nontender, nondistended, BS positive. CNS.  Alert and oriented .  No focal neurologic deficit. Extremities.  No edema, no cyanosis, pulses intact and symmetrical.   Condition at discharge: stable  The results of significant diagnostics from this hospitalization (including imaging, microbiology, ancillary and laboratory) are listed below for reference.   Imaging Studies: MR 3D Recon At Scanner Result Date: 12/15/2023 CLINICAL DATA:  88 year old female with history of acute cholecystitis. Biliary tract dilatation noted on prior CT examination. EXAM: MRI ABDOMEN WITHOUT AND WITH CONTRAST (INCLUDING MRCP) TECHNIQUE: Multiplanar multisequence MR imaging of the abdomen was performed both before and after the administration of intravenous contrast. Heavily T2-weighted images of the biliary and pancreatic ducts were obtained, and three-dimensional MRCP images were rendered by post processing. CONTRAST:  8mL GADAVIST GADOBUTROL 1 MMOL/ML IV SOLN COMPARISON:  None Available. FINDINGS: Comment:  Portions of today's examination are limited by substantial patient respiratory motion. Lower chest: Cardiomegaly with biatrial dilatation. Trace bilateral pleural effusions. Hepatobiliary: No definite suspicious cystic or solid hepatic lesions. Gallbladder is severely distended. Amorphous T2 hypointense material lying dependently in the gallbladder likely reflects biliary sludge and/or tiny gallstones. Gallbladder wall appears thickened and edematous with trace volume of pericholecystic fluid. MRCP images are severely limited by patient respiratory motion, essentially nondiagnostic. With this limitation in mind, no definite intra or extrahepatic biliary ductal dilatation is noted on today's examination. Pancreas: No pancreatic mass. No definite pancreatic ductal dilatation noted. No peripancreatic fluid collections or inflammatory changes. Spleen:  Unremarkable. Adrenals/Urinary Tract: T1 hypointense, T2 hyperintense, nonenhancing lesions in the right kidney measuring up to 2.6 cm in the upper pole, compatible with simple (Bosniak class 1) cysts (no imaging follow-up recommended). No hydroureteronephrosis in the visualized portions of the abdomen. Bilateral adrenal glands are normal in appearance. Stomach/Bowel: Visualized portions are unremarkable. Vascular/Lymphatic: No aneurysm identified in the visualized abdominal vasculature. No lymphadenopathy noted in the abdomen. Other: No significant volume of ascites noted in the visualized portions of the peritoneal cavity. Musculoskeletal: No aggressive appearing osseous lesions are noted in the  visualized portions of the skeleton. IMPRESSION: 1. Study is severely limited by patient respiratory motion. Despite the limitations of today's examination, the findings are highly concerning for acute cholecystitis. Surgical consultation is recommended. 2. No definite choledocholithiasis or findings of biliary tract obstruction. 3. Cardiomegaly with biatrial dilatation.  Electronically Signed   By: Trudie Reed M.D.   On: 12/15/2023 07:57   US PELVIS (TRANSABDOMINAL ONLY) Result Date: 12/13/2023 CLINICAL DATA:  [id number] Cyst of left ovary [id number] EXAM: TRANSABDOMINAL ULTRASOUND OF PELVIS TECHNIQUE: Transabdominal ultrasound examination of the pelvis was performed including evaluation of the uterus, ovaries, adnexal regions, and pelvic cul-de-sac. COMPARISON:  MRI abdomen from earlier the same day and CT scan abdomen and pelvis from yesterday 12/12/2023. FINDINGS: Transvaginal exam could not be performed as patient was unable to tolerate/positioned to do the transvaginal exam. Uterus Measurements: 2.1 x 2.8 x 7.4 cm = volume: 22.8 mL. Small/atrophic uterus exhibiting diffuse myometrial calcifications, within normal limits for patient's age. No fibroids or other mass visualized. Endometrium Thickness: Not well seen but measures less than 2 mm in thickness. No discrete focal abnormality visualized. Right ovary Not visualized.  No large adnexal mass seen. Left ovary Not visualized. No large adnexal mass seen. Other findings:  No abnormal free fluid. IMPRESSION: 1. Limited transabdominal exam. 2. Bilateral ovaries are not visualized. No large adnexal mass seen on the provided images. 3. Small/atrophic uterus exhibiting myometrial calcifications, within normal limits for patient's age. No focal mass. Electronically Signed   By: Jules Schick M.D.   On: 12/13/2023 08:42   MR ABDOMEN MRCP W WO CONTAST Result Date: 12/13/2023 CLINICAL DATA:  88 year old female with history of acute cholecystitis. Biliary tract dilatation noted on prior CT examination. EXAM: MRI ABDOMEN WITHOUT AND WITH CONTRAST (INCLUDING MRCP) TECHNIQUE: Multiplanar multisequence MR imaging of the abdomen was performed both before and after the administration of intravenous contrast. Heavily T2-weighted images of the biliary and pancreatic ducts were obtained, and three-dimensional MRCP images were rendered by post  processing. CONTRAST:  8mL GADAVIST GADOBUTROL 1 MMOL/ML IV SOLN COMPARISON:  None Available. FINDINGS: Comment: Portions of today's examination are limited by substantial patient respiratory motion. Lower chest: Cardiomegaly with biatrial dilatation. Trace bilateral pleural effusions. Hepatobiliary: No definite suspicious cystic or solid hepatic lesions. Gallbladder is severely distended. Amorphous T2 hypointense material lying dependently in the gallbladder likely reflects biliary sludge and/or tiny gallstones. Gallbladder wall appears thickened and edematous with trace volume of pericholecystic fluid. MRCP images are severely limited by patient respiratory motion, essentially nondiagnostic. With this limitation in mind, no definite intra or extrahepatic biliary ductal dilatation is noted on today's examination. Pancreas: No pancreatic mass. No definite pancreatic ductal dilatation noted. No peripancreatic fluid collections or inflammatory changes. Spleen:  Unremarkable. Adrenals/Urinary Tract: T1 hypointense, T2 hyperintense, nonenhancing lesions in the right kidney measuring up to 2.6 cm in the upper pole, compatible with simple (Bosniak class 1) cysts (no imaging follow-up recommended). No hydroureteronephrosis in the visualized portions of the abdomen. Bilateral adrenal glands are normal in appearance. Stomach/Bowel: Visualized portions are unremarkable. Vascular/Lymphatic: No aneurysm identified in the visualized abdominal vasculature. No lymphadenopathy noted in the abdomen. Other: No significant volume of ascites noted in the visualized portions of the peritoneal cavity. Musculoskeletal: No aggressive appearing osseous lesions are noted in the visualized portions of the skeleton. IMPRESSION: 1. Study is severely limited by patient respiratory motion. Despite the limitations of today's examination, the findings are highly concerning for acute cholecystitis. Surgical consultation is recommended. 2. No  definite choledocholithiasis  or findings of biliary tract obstruction. 3. Cardiomegaly with biatrial dilatation. Electronically Signed   By: Trudie Reed M.D.   On: 12/13/2023 06:40   CT ABDOMEN PELVIS W CONTRAST Result Date: 12/12/2023 CLINICAL DATA:  Abdominal pain, acute (Ped 0-17y). Right upper quadrant abdominal pain EXAM: CT ABDOMEN AND PELVIS WITH CONTRAST TECHNIQUE: Multidetector CT imaging of the abdomen and pelvis was performed using the standard protocol following bolus administration of intravenous contrast. RADIATION DOSE REDUCTION: This exam was performed according to the departmental dose-optimization program which includes automated exposure control, adjustment of the mA and/or kV according to patient size and/or use of iterative reconstruction technique. CONTRAST:  75mL OMNIPAQUE IOHEXOL 350 MG/ML SOLN COMPARISON:  Sonogram 3:58 p.m. FINDINGS: Lower chest: Mild interstitial pulmonary edema. Mild global cardiomegaly with particular right atrial enlargement. Hepatobiliary: Cholelithiasis noted. The gallbladder is dilated and there is pericholecystic inflammatory stranding and trace free fluid in keeping with changes of acute calculus cholecystitis. There is mild intra and extrahepatic biliary ductal dilation to the level of the ampulla, however, a distal obstructing lesion or intraluminal stone is not identified on this limited examination. The liver is otherwise unremarkable. Pancreas: Atrophic but otherwise unremarkable. Spleen: Unremarkable Adrenals/Urinary Tract: The adrenal glands are unremarkable. The kidneys are normal in position. Moderate bilateral renal cortical atrophy with asymmetric cortical scarring involving the upper pole the right kidney. No enhancing intrarenal mass. Multiple simple cortical cysts are seen within the right kidney for which no follow-up imaging is recommended. No hydronephrosis. No intrarenal or ureteral calculi. The bladder is unremarkable. Stomach/Bowel:  Severe sigmoid diverticulosis. The stomach, small bowel, and large bowel are otherwise unremarkable. Appendix normal. No free intraperitoneal gas or fluid. Vascular/Lymphatic: Extensive aortoiliac atherosclerotic calcification. No aortic aneurysm. Shotty periportal adenopathy is nonspecific, possibly reactive in nature. No frankly pathologic adenopathy within the abdomen and pelvis. Reproductive: The left ovary is replaced with a heterogeneously enhancing solid lobulated mass measuring 2.6 x 4.4 cm, stable since prior exam when measured in similar fashion. Uterus and right adnexa are unremarkable. Other: No abdominal wall hernia Musculoskeletal: Osseous structures are age-appropriate. Remote T12 compression deformity is unchanged without associated retrolisthesis or retropulsion. No acute bone abnormality. IMPRESSION: 1. Acute calculus cholecystitis. 2. Mild intra and extrahepatic biliary ductal dilation to the level of the ampulla, however, a distal obstructing lesion or intraluminal stone is not identified on this limited examination. Correlation with liver function tests may be helpful. If indicated, this could be further assessed with MRCP or intraoperative cholangiography. 3. Severe sigmoid diverticulosis. 4. Stable 4.4 cm solid lobulated mass replacing the left ovary. While this may represent a benign ovarian neoplasm, gynecologic consultation is recommended for further management. 5. Mild global cardiomegaly with particular right atrial enlargement. Mild interstitial pulmonary edema. Aortic Atherosclerosis (ICD10-I70.0). Electronically Signed   By: Helyn Numbers M.D.   On: 12/12/2023 20:08   US Abdomen Limited RUQ (LIVER/GB) Result Date: 12/12/2023 CLINICAL DATA:  Right upper quadrant abdominal pain EXAM: ULTRASOUND ABDOMEN LIMITED RIGHT UPPER QUADRANT COMPARISON:  CT 08/17/2022. FINDINGS: Gallbladder: Dilated gallbladder. Slight wall thickening of 4 mm. Layering small stones. No adjacent fluid. No  reported sonographic Murphy's sign. Common bile duct: Diameter: Common duct upper 9 mm, upper limits of normal for the patient's the patient's age. Liver: No focal lesion identified. Within normal limits in parenchymal echogenicity. Portal vein is patent on color Doppler imaging with normal direction of blood flow towards the liver. Other: Note is made of a right-sided renal cyst which appears simple measuring up to  2.7 cm. Small right-sided pleural effusion. IMPRESSION: Dilated gallbladder with layering stones. Slight wall thickening. Please correlate for other clinical findings of acute cholecystitis and if confirmatory study is needed, HIDA scan may be of some benefit. Common duct at 9 mm, upper limits of normal for the the patient's age. Small right-sided pleural effusion. Electronically Signed   By: Karen Kays M.D.   On: 12/12/2023 16:31    Microbiology: Results for orders placed or performed during the hospital encounter of 12/12/23  Culture, blood (Routine X 2) w Reflex to ID Panel     Status: None (Preliminary result)   Collection Time: 12/13/23  5:08 AM   Specimen: BLOOD RIGHT ARM  Result Value Ref Range Status   Specimen Description BLOOD RIGHT ARM  Final   Special Requests   Final    BOTTLES DRAWN AEROBIC AND ANAEROBIC Blood Culture adequate volume   Culture   Final    NO GROWTH 3 DAYS Performed at [facility], [address] [place], [place] [address]    Report Status PENDING  Incomplete  Culture, blood (Routine X 2) w Reflex to ID Panel     Status: None (Preliminary result)   Collection Time: 12/13/23  5:09 AM   Specimen: BLOOD RIGHT ARM  Result Value Ref Range Status   Specimen Description BLOOD RIGHT ARM  Final   Special Requests   Final    AEROBIC BOTTLE ONLY Blood Culture results may not be optimal due to an inadequate volume of blood received in culture bottles   Culture   Final    NO GROWTH 3 DAYS Performed at [facility], [address]  [place], [place] [address]    Report Status PENDING  Incomplete    Labs: CBC: Recent Labs  Lab 12/12/23 1541 12/13/23 0509 12/14/23 0243 12/15/23 0245 12/16/23 0355  WBC 7.9 12.7* 5.2 5.2 7.6  NEUTROABS 6.4  --  3.6 3.3 6.6  HGB 11.3* 10.3* 9.1* 10.1* 11.9*  HCT 35.7* 32.5* 27.8* 30.9* 36.5  MCV 90.8 91.0 89.4 88.3 88.8  PLT 220 207 159 180 238   Basic Metabolic Panel: Recent Labs  Lab 12/12/23 1541 12/13/23 0509 12/14/23 0243 12/15/23 0245 12/16/23 0355  NA 136 132* 135 134* 132*  K 4.2 4.3 4.1 3.9 4.5  CL 101 98 99 102 101  CO2 22 22 21* 21* 17*  GLUCOSE 151* 141* 75 90 147*  BUN 17 17 20 22 23   CREATININE 1.15* 1.23* 1.41* 1.21* 1.51*  CALCIUM 9.8 9.3 9.7 9.1 9.2   Liver Function Tests: Recent Labs  Lab 12/12/23 1541 12/13/23 0509 12/14/23 0243 12/15/23 0245 12/16/23 0355  AST 46* 141* 74* 51* 49*  ALT 23 60* 47* 39 31  ALKPHOS 111 121 133* 162* 174*  BILITOT 1.7* 3.7* 3.5* 2.0* 1.6*  PROT 7.1 6.1* 5.4* 5.7* 6.1*  ALBUMIN 4.0 3.3* 2.9* 2.9* 3.0*   CBG: No results for input(s): "GLUCAP" in the last 168 hours.  Discharge time spent: greater than 30 minutes.  This record has been created using Conservation officer, historic buildings. Errors have been sought and corrected,but may not always be located. Such creation errors do not reflect on the standard of care.   Signed: Arnetha Courser, MD [facility] 12/16/2023

## 2023-12-16 NOTE — TOC Initial Note (Signed)
Transition of Care [facility]) - Initial/Assessment Note    Patient Details  Name: Anna [name] MRN: [id number] Date of Birth: Dec 10, 1935  Transition of Care [facility]) CM/SW Contact:    Leone Haven, RN Phone Number: 12/16/2023, 1:38 PM  Clinical Narrative:                 From home , has PCP and insurance on file, states has no HH services in place at this time or DME at home.  States family member will transport them home at [employer] and family is support system, .  Pta self ambulatory.  NCM offered choice to daughter for [facility], HHPT, HHOT, she does not have a preference .  NCM made referral to [facility] with [facility], he is able to take referral.  Soc will begin 24 to 48 hrs post dc.   Expected Discharge Plan: Home w Home Health Services Barriers to Discharge: No Barriers Identified   Patient Goals and CMS Choice Patient states their goals for this hospitalization and ongoing recovery are:: return home CMS Medicare.gov Compare Post Acute Care list provided to:: Patient Represenative (must comment) Choice offered to / list presented to : Adult Children      Expected Discharge Plan and Services   Discharge Planning Services: CM Consult Post Acute Care Choice: Home Health Living arrangements for the past 2 months: Single Family Home Expected Discharge Date: 12/16/23               DME Arranged: N/A DME Agency: NA       HH Arranged: RN, OT, PT, Disease Management HH Agency: [facility] Date HH Agency Contacted: 12/16/23 Time HH Agency Contacted: [phone] Representative spoke with at [facility] Agency: Clifton Custard  Prior Living Arrangements/Services Living arrangements for the past 2 months: Single Family Home Lives with:: Adult Children Patient language and need for interpreter reviewed:: Yes Do you feel safe going back to the place where you live?: Yes      Need for Family Participation in Patient Care: Yes (Comment) Care giver support system in place?: Yes (comment)   Criminal  Activity/Legal Involvement Pertinent to Current Situation/Hospitalization: No - Comment as needed  Activities of Daily Living   ADL Screening (condition at time of admission) Independently performs ADLs?: No Does the patient have a NEW difficulty with bathing/dressing/toileting/self-feeding that is expected to last >3 days?: No Does the patient have a NEW difficulty with getting in/out of bed, walking, or climbing stairs that is expected to last >3 days?: Yes (Initiates electronic notice to provider for possible PT consult) Does the patient have a NEW difficulty with communication that is expected to last >3 days?: No Is the patient deaf or have difficulty hearing?: No Does the patient have difficulty seeing, even when wearing glasses/contacts?: No Does the patient have difficulty concentrating, remembering, or making decisions?: No  Permission Sought/Granted Permission sought to share information with : Case Manager Permission granted to share information with : Yes, Verbal Permission Granted              Emotional Assessment       Orientation: : Oriented to Self, Oriented to  Time, Oriented to Situation, Oriented to Place Alcohol / Substance Use: Not Applicable Psych Involvement: No (comment)  Admission diagnosis:  Acute cholecystitis [K81.0] Cholecystitis [K81.9] Patient Active Problem List   Diagnosis Date Noted   Acute cholecystitis due to biliary calculus 12/13/2023   Ovarian mass, left 12/13/2023   Acute cholecystitis 12/13/2023   CKD (chronic kidney  disease), stage II 12/13/2023   Chronic diastolic CHF preserved EF 55 to 60% (congestive heart failure) (HCC) 12/13/2023   Chronic pain syndrome 12/13/2023   Takotsubo cardiomyopathy 07/09/2023   Elevated troponin    Acute heart failure [facility])    NSTEMI (non-ST elevated myocardial infarction) (HCC) 08/17/2022   Mitral stenosis 05/07/2021   Recurrent falls 04/24/2021   History of vertebral fracture 04/24/2021   Lumbar  radiculopathy 04/24/2021   Anemia due to stage 3a chronic kidney disease (HCC) 03/07/2020   Stage 3a chronic kidney disease (HCC) 03/07/2020   Mixed incontinence urge and stress 03/09/2018   Prediabetes 03/09/2018   OA (osteoarthritis) of knee 03/09/2018   Sinus bradycardia 12/18/2016   Estrogen deficiency 07/03/2016   Mitral regurgitation 03/01/2014   Chronic atrial flutter (HCC) 03/01/2014   Permanent atrial fibrillation (HCC) 12/24/2013   Essential hypertension, benign 12/24/2013   Heart murmur 12/24/2013   Hyperlipemia 12/24/2013   PCP:  Marcine Matar, MD Pharmacy:   [facility] [phone] [place], [place] - [address] [place] [address] Phone: [phone] Fax: [phone]     Social Drivers of Health (SDOH) Social History: SDOH Screenings   Food Insecurity: No Food Insecurity (12/13/2023)  Housing: Low Risk  (12/13/2023)  Transportation Needs: No Transportation Needs (12/13/2023)  Utilities: Not At Risk (12/13/2023)  Alcohol Screen: Low Risk  (02/21/2023)  Depression (PHQ2-9): Low Risk  (06/02/2023)  Financial Resource Strain: Low Risk  (02/21/2023)  Physical Activity: Inactive (02/21/2023)  Social Connections: Moderately Isolated (12/16/2023)  Stress: No Stress Concern Present (02/21/2023)  Tobacco Use: Low Risk  (12/14/2023)   SDOH Interventions:     Readmission Risk Interventions     No data to display

## 2023-12-16 NOTE — Progress Notes (Signed)
1 Day Post-Op  Subjective: Feels well this morning.  Denies pain.  Eating oatmeal for breakfast with no issues.  Voiding well.  Wants to go home  ROS: See above, otherwise other systems negative  Objective: Vital signs in last 24 hours: Temp:  [97.3 F (36.3 C)-98.3 F (36.8 C)] 98.2 F (36.8 C) (02/18 0727) Pulse Rate:  [49-77] 67 (02/18 0727) Resp:  [17-18] 18 (02/18 0727) BP: (107-184)/(49-64) 131/50 (02/18 0727) SpO2:  [91 %-98 %] 96 % (02/18 0727) FiO2 (%):  [21 %] 21 % (02/17 1411) Weight:  [73.8 kg] 73.8 kg (02/18 0428) Last BM Date : 12/11/23  Intake/Output from previous day: 02/17 0701 - 02/18 0700 In: 727 [P.O.:177; I.V.:500; IV Piggyback:50] Out: 210 [Urine:200; Blood:10] Intake/Output this shift: No intake/output data recorded.  PE: Abd: soft, appropriately tender, +BS, ND, incisions c/d/I with ecchymosis noted at her umbilical incision  Lab Results:  Recent Labs    12/15/23 0245 12/16/23 0355  WBC 5.2 7.6  HGB 10.1* 11.9*  HCT 30.9* 36.5  PLT 180 238   BMET Recent Labs    12/15/23 0245 12/16/23 0355  NA 134* 132*  K 3.9 4.5  CL 102 101  CO2 21* 17*  GLUCOSE 90 147*  BUN 22 23  CREATININE 1.21* 1.51*  CALCIUM 9.1 9.2   PT/INR No results for input(s): "LABPROT", "INR" in the last 72 hours. CMP     Component Value Date/Time   NA 132 (L) 12/16/2023 0355   NA 137 10/02/2023 1639   K 4.5 12/16/2023 0355   CL 101 12/16/2023 0355   CO2 17 (L) 12/16/2023 0355   GLUCOSE 147 (H) 12/16/2023 0355   BUN 23 12/16/2023 0355   BUN 24 10/02/2023 1639   CREATININE 1.51 (H) 12/16/2023 0355   CREATININE 1.04 (H) 06/19/2016 1503   CALCIUM 9.2 12/16/2023 0355   PROT 6.1 (L) 12/16/2023 0355   PROT 6.7 10/02/2023 1639   ALBUMIN 3.0 (L) 12/16/2023 0355   ALBUMIN 4.5 10/02/2023 1639   AST 49 (H) 12/16/2023 0355   ALT 31 12/16/2023 0355   ALKPHOS 174 (H) 12/16/2023 0355   BILITOT 1.6 (H) 12/16/2023 0355   BILITOT 0.5 10/02/2023 1639   GFRNONAA 33  (L) 12/16/2023 0355   GFRNONAA 56 (L) 10/26/2015 1204   GFRAA 56 (L) 07/07/2020 1421   GFRAA 64 10/26/2015 1204   Lipase     Component Value Date/Time   LIPASE 25 12/12/2023 1541       Studies/Results: No results found.  Anti-infectives: Anti-infectives (From admission, onward)    Start     Dose/Rate Route Frequency Ordered Stop   12/13/23 2200  cefTRIAXone (ROCEPHIN) 2 g in sodium chloride 0.9 % 100 mL IVPB        2 g 200 mL/hr over 30 Minutes Intravenous Every 24 hours 12/13/23 0306 12/20/23 2159   12/13/23 0315  metroNIDAZOLE (FLAGYL) IVPB 500 mg        500 mg 100 mL/hr over 60 Minutes Intravenous Every 12 hours 12/13/23 0306 12/20/23 0314   12/13/23 0200  cefTRIAXone (ROCEPHIN) 2 g in sodium chloride 0.9 % 100 mL IVPB        2 g 200 mL/hr over 30 Minutes Intravenous  Once 12/13/23 0149 12/13/23 0236   12/13/23 0200  azithromycin (ZITHROMAX) 500 mg in sodium chloride 0.9 % 250 mL IVPB        500 mg 250 mL/hr over 60 Minutes Intravenous  Once 12/13/23 0149 12/13/23 0414  Assessment/Plan POD 1, s/p lap chole, Dr. Magnus Ivan 2/17 for cholecystitis -s/p ERCP for sludge in CBD -tolerating solid diet with no issues -pain is well controlled -may resume Eliquis today -LFTs overall down trending, Cr bump slightly to 1.51. -doing well surgically.  Surgically stable for DC once medically stable for DC. -follow up being arranged.  FEN - soft VTE - may resume Eliquis ID - no further needed  Multiple medical problems, per primary service.    LOS: 3 days    Letha Cape , [facility] Surgery 12/16/2023, 7:48 AM Please see Amion for pager number during day hours 7:00am-4:30pm or 7:00am -11:30am on weekends

## 2023-12-16 NOTE — Evaluation (Signed)
Occupational Therapy Evaluation Patient Details Name: Anna [name] MRN: [id number] DOB: [birth year] Today's Date: 12/16/2023   History of Present Illness   88 y.o. female presents to [facility] hospital on 12/13/2023 with RUQ pain and urinary retention. CT abdomen with acute calculus cholecystitis. Upper EUS on 2/16. Laparoscopic cholecystectomy on 2/17. PMH includes PAF, diastolic heart failure, prediabetes, CKD II, cardiomyopathy, MR, OA.     Clinical Impressions Patient admitted for the diagnosis above.  PTA she lives at home with her daughter, who is a PT at a local SNF.  In addition, her other daughter is currently staying at the home as well as she transitions to another home.  PTA she uses a Wheelchair for mobility, has an aide 6x/wk that assists with basic transfers and provides any needed ADL support.  Currently she complains of chronic B knee pain, which is limiting ADL and transfer status.  She is needing closer to Mod A for lower body ADL and up to Mod A for simple transfers.  Her plan is to return home, I don't believe [facility] OT would provide much benefit given her prior level of supports.  OT will continue efforts in the acute setting while she is here.          If plan is discharge home, recommend the following:   Assist for transportation;A little help with walking and/or transfers;A little help with bathing/dressing/bathroom;Assistance with cooking/housework     Functional Status Assessment   Patient has had a recent decline in their functional status and demonstrates the ability to make significant improvements in function in a reasonable and predictable amount of time.     Equipment Recommendations   None recommended by OT     Recommendations for Other Services         Precautions/Restrictions   Precautions Precautions: Fall Recall of Precautions/Restrictions: Intact Precaution/Restrictions Comments: bilateral knee OA Restrictions Weight Bearing Restrictions Per  Provider Order: No     Mobility Bed Mobility               General bed mobility comments: up in recliner from PT    Transfers Overall transfer level: Needs assistance Equipment used: Rolling walker (2 wheels) Transfers: Sit to/from Stand Sit to Stand: Mod assist                  Balance Overall balance assessment: Modified Independent Sitting-balance support: No upper extremity supported, Feet supported Sitting balance-Leahy Scale: Fair     Standing balance support: Reliant on assistive device for balance Standing balance-Leahy Scale: Poor                             ADL either performed or assessed with clinical judgement   ADL       Grooming: Wash/dry hands;Wash/dry face;Sitting;Contact guard assist           Upper Body Dressing : Minimal assistance;Sitting       Toilet Transfer: Moderate assistance;Stand-pivot;Regular Toilet                   Vision Patient Visual Report: No change from baseline       Perception Perception: Not tested       Praxis Praxis: Not tested       Pertinent Vitals/Pain Pain Assessment Pain Assessment: Faces Faces Pain Scale: Hurts little more Pain Location: knees Pain Descriptors / Indicators: Aching, Sore, Tightness Pain Intervention(s): Monitored during session     Extremity/Trunk  Assessment Upper Extremity Assessment Upper Extremity Assessment: Generalized weakness   Lower Extremity Assessment Lower Extremity Assessment: Defer to PT evaluation   Cervical / Trunk Assessment Cervical / Trunk Assessment: Kyphotic   Communication Communication Communication: No apparent difficulties   Cognition Arousal: Alert Behavior During Therapy: WFL for tasks assessed/performed Cognition: No apparent impairments                               Following commands: Intact       Cueing  General Comments   Cueing Techniques: Verbal cues      Exercises     Shoulder  Instructions      Home Living Family/patient expects to be discharged to:: Private residence Living Arrangements: Children Available Help at Discharge: Family;Personal care attendant Type of Home: House Home Access: Ramped entrance     Home Layout: One level     Bathroom Shower/Tub: Producer, television/film/video: Handicapped height Bathroom Accessibility: Yes How Accessible: Accessible via walker Home Equipment: Rolling Walker (2 wheels);BSC/3in1;Wheelchair - manual;Grab bars - toilet;Grab bars - tub/shower;Hand held shower head;Shower seat;Adaptive equipment Adaptive Equipment: Long-handled sponge;Reacher        Prior Functioning/Environment Prior Level of Function : Needs assist             Mobility Comments: pt independently transfers to a manual wheelchair and is able to mobilize within the home at wheelchair level. Pt does ambulate with support of RW and wheelchair follow when her aide is present. ADLs Comments: pt has PRN assistance from aide for bathing and dressing. Pt reports managing her own meds. Assist for transportation.    OT Problem List: Decreased strength;Decreased range of motion;Decreased activity tolerance;Impaired balance (sitting and/or standing)   OT Treatment/Interventions: Self-care/ADL training;Therapeutic activities;Patient/family education;Balance training;DME and/or AE instruction      OT Goals(Current goals can be found in the care plan section)   Acute Rehab OT Goals Patient Stated Goal: Return home OT Goal Formulation: With patient Time For Goal Achievement: 12/30/23 Potential to Achieve Goals: Good ADL Goals Pt Will Perform Grooming: with set-up;sitting Pt Will Perform Upper Body Dressing: with set-up;sitting Pt Will Transfer to Toilet: with min assist;stand pivot transfer;bedside commode   OT Frequency:  Min 1X/week    Co-evaluation              AM-PAC OT "6 Clicks" Daily Activity     Outcome Measure Help from  another person eating meals?: None Help from another person taking care of personal grooming?: A Little Help from another person toileting, which includes using toliet, bedpan, or urinal?: A Lot Help from another person bathing (including washing, rinsing, drying)?: A Lot Help from another person to put on and taking off regular upper body clothing?: A Little Help from another person to put on and taking off regular lower body clothing?: A Lot 6 Click Score: 16   End of Session Equipment Utilized During Treatment: Rolling walker (2 wheels) Nurse Communication: Mobility status  Activity Tolerance: Patient tolerated treatment well Patient left: in chair;with call bell/phone within reach;with chair alarm set  OT Visit Diagnosis: Unsteadiness on feet (R26.81);Pain Pain - part of body: Knee                Time: 1610-9604 OT Time Calculation (min): 19 min Charges:  OT General Charges $OT Visit: 1 Visit OT Evaluation $OT Eval Moderate Complexity: 1 Mod  12/16/2023  RP, OTR/L  Acute Rehabilitation Services  Office:  [phone]   Suzanna Obey 12/16/2023, 11:46 AM

## 2023-12-16 NOTE — Consult Note (Signed)
Consultation Note Date: 12/16/2023   Patient Name: Anna [name]  DOB: [birth year]  MRN: [id number]  Age / Sex: 88 y.o., female  PCP: Marcine Matar, MD Referring Physician: Arnetha Courser, MD  Reason for Consultation: Establishing goals of care  HPI/Patient Profile: 88 y.o. female  with past medical history of *** admitted on 12/12/2023 with ***.   Anna [name] is a 88 y.o. female with medical history significant of permanent atrial fibrillation, diastolic heart failure preserved EF 55 to 60%, prediabetes, CKD stage II, stress-induced cardiomyopathy, mitral regurgitation, aortic regurgitation, lumbar radiculopathy, osteoarthritis, history of vertebral fracture currently following pain management presented to emergency department complaining of right upper quadrant abdominal pain, poor urinary output and constipation. She denies any fever, chill, nausea, vomiting and diarrhea.   On presentation had significantly elevated blood pressure at 206/92 which was later improved.  CBC unremarkable, CMP with creatinine at 1.15, T. bili 1.7, AST 47, normal lipase at 25. CT abdomen and pelvis with concern of acute calculus cholecystitis with mild intra and extrahepatic biliary ductal dilation.  Also noted to have a stable 4.4 cm solid lobulated left ovarian mass with recommendation to follow-up with gynecology.  CA125 was ordered   RUQ ultrasound with dilated gallbladder, layering of stone, slight wall thickening.   Gastroenterology and general surgery was consulted.  Started on ceftriaxone and Flagyl.   2/16: Hemodynamically stable, progressively worsening creatinine, currently at 1.41, mild metabolic acidosis with bicarb at 21, T. bili 3.5, alkaline phosphatase 133 and some improvement in AST and ALT.  Preliminary blood cultures negative in 12 hours. ERCP with concern of acute cholecystitis, no definitive  choledocholithiasis. GI for possible ERCP today. Cardiology cleared for surgery, Eliquis is being held.  Current plan is for cholecystectomy on Monday.   2/17: Hemodynamically stable, slight worsening of alkaline phosphatase to 162, T. bili at 2, improving creatinine at 1.21.  Going for laparoscopic cholecystectomy today.   2/18: Vital stable.  S/p laparoscopic cholecystectomy, from surgical standpoint she is stable and they cleared for discharge.  Labs with mild hyponatremia at 132 and worsening bicarb at 17, alkaline phosphatase 174 and T. bili 1.6, slight worsening of creatinine at 1.51-giving some more IV fluid.  PT/OT recommended home health.  Patient wants to go home today due to upcoming inclement weather.  Agree to stay to get some IV fluid till later in the afternoon and then go home.   Surgery discontinued antibiotics, no need to continue at this time.  Patient already had pain medications which are being managed by pain management so no new prescriptions were provided.  She was giving some Zofran to use as needed.   Patient was instructed to keep herself well-hydrated, talked with granddaughter who will be her caregiver.  She will continue on current medications and follow-up with her providers closely for further management.     Clinical Assessment and Goals of Care:  This NP Lorinda Creed reviewed medical records, received report from team, assessed the patient and then meet at the patient's  bedside  to discuss diagnosis, prognosis, GOC, EOL wishes disposition and options.   Concept of Palliative Care was introduced as specialized medical care for people and their families living with serious illness.  If focuses on providing relief from the symptoms and stress of a serious illness.  The goal is to improve quality of life for both the patient and the family.  Spoke to daughter Arline Asp Parrich/ HPOA by telephone  Created space and opportunity for patient  and family to explore thoughts  and feelings regarding current medical situation     A  discussion was had today regarding advanced directives.  Concepts specific to code status, artifical feeding and hydration, continued IV antibiotics and rehospitalization was had.  The difference between a aggressive medical intervention path  and a palliative comfort care path for this patient at this time was had.  Values and goals of care important to patient and family were attempted to be elicited.   MOST form    Natural trajectory and expectations at EOL were discussed.  Questions and concerns addressed.  Patient  encouraged to call with questions or concerns.     PMT will continue to support holistically.             HCPOA    SUMMARY OF RECOMMENDATIONS    Code Status/Advance Care Planning: Full code    Educated patient/family to consider DNR/DNI status understanding evidenced based poor outcomes in similar hospitalized patient, as the cause of arrest is likely associated with advanced chronic illness rather than an easily reversible acute cardio-pulmonary event.      Palliative Prophylaxis:  Bowel Regimen, Delirium Protocol, and Frequent Pain Assessment  Additional Recommendations (Limitations, Scope, Preferences): Avoid Hospitalization  Psycho-social/Spiritual:  Desire for further Chaplaincy support:no Additional Recommendations: Education on Hospice  Prognosis:  Unable to determine  Discharge Planning: Home with Home Health      Primary Diagnoses: Present on Admission:  Permanent atrial fibrillation (HCC)  Takotsubo cardiomyopathy  (Resolved) Acute cholecystitis  Stage 3a chronic kidney disease (HCC)   I have reviewed the medical record, interviewed the patient and family, and examined the patient. The following aspects are pertinent.  Past Medical History:  Diagnosis Date   Dysrhythmia    Hyperlipidemia    Hypertension    Mild CAD    Mitral regurgitation    trivial   Mitral stenosis     mild   Osteoarthritis    Overweight(278.02)    Permanent atrial fibrillation (HCC)    Takotsubo cardiomyopathy    Social History   Socioeconomic History   Marital status: Divorced    Spouse name: Not on file   Number of children: 3   Years of education: Not on file   Highest education level: Not on file  Occupational History   Occupation: retired  Tobacco Use   Smoking status: Never   Smokeless tobacco: Never  Vaping Use   Vaping status: Never Used  Substance and Sexual Activity   Alcohol use: No   Drug use: No   Sexual activity: Not on file  Other Topics Concern   Not on file  Social History Narrative   Not on file   Social Drivers of Health   Financial Resource Strain: Low Risk  (02/21/2023)   Overall Financial Resource Strain (CARDIA)    Difficulty of Paying Living Expenses: Not hard at all  Food Insecurity: No Food Insecurity (12/13/2023)   Hunger Vital Sign    Worried About Running Out of Food in the  Last Year: Never true    Ran Out of Food in the Last Year: Never true  Transportation Needs: No Transportation Needs (12/13/2023)   PRAPARE - Administrator, Civil Service (Medical): No    Lack of Transportation (Non-Medical): No  Physical Activity: Inactive (02/21/2023)   Exercise Vital Sign    Days of Exercise per Week: 0 days    Minutes of Exercise per Session: 0 min  Stress: No Stress Concern Present (02/21/2023)   [employer] of Occupational Health - Occupational Stress Questionnaire    Feeling of Stress : Not at all  Social Connections: Not on file   Family History  Problem Relation Age of Onset   Alzheimer's disease Mother    Scheduled Meds:  artificial tears  1 Application Left Eye QID   carvedilol  3.125 mg Oral BID WC   sacubitril-valsartan  1 tablet Oral BID   sodium chloride flush  3 mL Intravenous Q12H   sodium chloride flush  3 mL Intravenous Q12H   spironolactone  12.5 mg Oral Daily   Continuous Infusions:  lactated ringers      PRN Meds:.acetaminophen **OR** acetaminophen, cyclobenzaprine, diclofenac Sodium, hydrALAZINE, HYDROcodone-acetaminophen, morphine injection, ondansetron **OR** ondansetron (ZOFRAN) IV, senna-docusate, sodium chloride flush Medications Prior to Admission:  Prior to Admission medications   Medication Sig Start Date End Date Taking? Authorizing Provider  carvedilol (COREG) 3.125 MG tablet Take 1 tablet (3.125 mg total) by mouth 2 (two) times daily with a meal. 08/22/23  Yes Marcine Matar, MD  Cholecalciferol (VITAMIN D3) 10 MCG (400 UNIT) tablet Take 2 tablets (800 Units total) by mouth daily. 08/26/22  Yes Marcine Matar, MD  cyclobenzaprine (FLEXERIL) 10 MG tablet Take 0.5 tablets (5 mg total) by mouth 2 (two) times daily as needed for muscle spasms. 03/30/21  Yes Marcine Matar, MD  dapagliflozin propanediol (FARXIGA) 10 MG TABS tablet TAKE 1 TABLET BY MOUTH EVERY DAY 10/24/23  Yes Turner, Cornelious Bryant, MD  diclofenac Sodium (VOLTAREN) 1 % GEL Apply 2 grams to knees 4 times a day as needed. Patient taking differently: Apply 2 g topically 4 (four) times daily as needed (for knee pain). Apply 2 grams to knees 4 times a day as needed. 10/02/23  Yes Marcine Matar, MD  docusate sodium (COLACE) 100 MG capsule Take 100 mg by mouth daily.    Yes [provider]  ELIQUIS 5 MG TABS tablet TAKE 1 TABLET BY MOUTH TWICE A DAY 12/05/22  Yes Turner, Traci R, MD  ferrous sulfate 325 (65 FE) MG tablet TAKE 1 TABLET BY MOUTH EVERY DAY WITH BREAKFAST 11/27/23  Yes Marcine Matar, MD  furosemide (LASIX) 20 MG tablet Take 1 tablet (20 mg total) by mouth as needed. Patient taking differently: Take 20 mg by mouth as needed for edema. 10/06/23  Yes Chandrasekhar, Mahesh A, MD  HYDROcodone-acetaminophen (NORCO) 7.5-325 MG tablet Take 1 tablet by mouth 4 (four) times daily as needed. 09/05/23  Yes [provider]  Omega-3 Fatty Acids (FISH OIL CONCENTRATE PO) Take 1 capsule by mouth daily.     Yes [provider]  rosuvastatin (CRESTOR) 10 MG tablet TAKE 1 TABLET BY MOUTH EVERY MONDAY, WEDNESDAY, FRIDAY, AND SATURDAY 12/08/23  Yes Marcine Matar, MD  sacubitril-valsartan (ENTRESTO) 24-26 MG TAKE 1 TABLET BY MOUTH TWICE A DAY 10/24/23  Yes Quintella Reichert, MD  spironolactone (ALDACTONE) 25 MG tablet TAKE 1/2 TABLET BY MOUTH EVERY DAY 10/24/23  Yes Turner, Traci R,  MD  White Petrolatum-Mineral Oil (SYSTANE NIGHTTIME) OINT Place 1 drop into the left eye 4 (four) times daily.   Yes [provider]  Misc. Devices MISC Electric Wheelchair 03/29/21   Marcine Matar, MD  Misc. Devices MISC Rollator walker 03/29/21   Marcine Matar, MD  Misc. Devices [facility] bed 03/29/21   Marcine Matar, MD  naloxone [facility]) nasal spray 4 mg/0.1 mL Place 1 spray into the nose once.    [provider]   No Known Allergies Review of Systems  Physical Exam  Vital Signs: BP (!) 131/50   Pulse 69   Temp 98.2 F (36.8 C) (Oral)   Resp 18   Wt 73.8 kg   SpO2 96%   BMI 27.93 kg/m  Pain Scale: 0-10 POSS *See Group Information*: S-Acceptable,Sleep, easy to arouse Pain Score: 0-No pain   SpO2: SpO2: 96 % O2 Device:SpO2: 96 % O2 Flow Rate: .O2 Flow Rate (L/min): 6 L/min  IO: Intake/output summary:  Intake/Output Summary (Last 24 hours) at 12/16/2023 0919 Last data filed at 12/16/2023 0841 Gross per 24 hour  Intake 787 ml  Output 210 ml  Net 577 ml    LBM: Last BM Date : 12/11/23 Baseline Weight: Weight: 72 kg Most recent weight: Weight: 73.8 kg     Palliative Assessment/Data:     Time 75  minutes  Signed by: Lorinda Creed, NP   Please contact Palliative Medicine Team phone at [phone] for questions and concerns.  For individual provider: See Loretha Stapler

## 2023-12-16 NOTE — TOC Transition Note (Signed)
Transition of Care [facility]) - Discharge Note   Patient Details  Name: Anna [name] MRN: [id number] Date of Birth: 1935-12-05  Transition [facility] CM/SW Contact:  Leone Haven, RN Phone Number: 12/16/2023, 1:40 PM   Clinical Narrative:    For dc , she is set up with [facility].  Daughter will transport her home.   Final next level of care: Home w Home Health Services Barriers to Discharge: No Barriers Identified   Patient Goals and CMS Choice Patient states their goals for this hospitalization and ongoing recovery are:: return home CMS Medicare.gov Compare Post Acute Care list provided to:: Patient Represenative (must comment) Choice offered to / list presented to : Adult Children      Discharge Placement                       Discharge Plan and Services Additional resources added to the After Visit Summary for     Discharge Planning Services: CM Consult Post Acute Care Choice: Home Health          DME Arranged: N/A DME Agency: NA       HH Arranged: RN, OT, PT, Disease Management HH Agency: [facility] Date [facility] Agency Contacted: 12/16/23 Time HH Agency Contacted: [phone] Representative spoke with at [facility] Agency: Clifton Custard  Social Drivers of Health (SDOH) Interventions SDOH Screenings   Food Insecurity: No Food Insecurity (12/13/2023)  Housing: Low Risk  (12/13/2023)  Transportation Needs: No Transportation Needs (12/13/2023)  Utilities: Not At Risk (12/13/2023)  Alcohol Screen: Low Risk  (02/21/2023)  Depression (PHQ2-9): Low Risk  (06/02/2023)  Financial Resource Strain: Low Risk  (02/21/2023)  Physical Activity: Inactive (02/21/2023)  Social Connections: Moderately Isolated (12/16/2023)  Stress: No Stress Concern Present (02/21/2023)  Tobacco Use: Low Risk  (12/14/2023)     Readmission Risk Interventions     No data to display

## 2023-12-17 ENCOUNTER — Telehealth: Payer: Self-pay

## 2023-12-17 ENCOUNTER — Other Ambulatory Visit: Payer: Self-pay | Admitting: Cardiology

## 2023-12-17 ENCOUNTER — Telehealth: Payer: Self-pay | Admitting: Cardiology

## 2023-12-17 DIAGNOSIS — I4821 Permanent atrial fibrillation: Secondary | ICD-10-CM

## 2023-12-17 LAB — SURGICAL PATHOLOGY

## 2023-12-17 NOTE — Telephone Encounter (Signed)
Patient's granddaughter Osvaldo Human called in with concerns of the patient being recently discharged from the hospital and had previously not been taking her apixaban.  When they arrived home after discharge they noted that she had been totally out of her apixaban and they had concerns over blood clotting.  Patient stated that she had called in 2 separate occasions to try to get prescription refilled [facility] local [facility] but had not been notified of medication being ready.  After further discussion [facility] her apixaban 5 mg twice daily as previously directed.  Granddaughter will call back if she is unable to get the medication from the pharmacy due to weather or if [facility] is out of stock of [facility]aban and she will call the office tomorrow and attempt to pick up samples at that time.

## 2023-12-17 NOTE — Transitions of Care (Post Inpatient/ED Visit) (Signed)
   12/17/2023  Name: Anna [name] MRN: [id number] DOB: 1935/12/28  Today's TOC FU Call Status: Today's TOC FU Call Status:: Unsuccessful Call (1st Attempt) Unsuccessful Call (1st Attempt) Date: 12/17/23  Attempted to reach the patient regarding the most recent Inpatient/ED visit.  Follow Up Plan: Additional outreach attempts will be made to reach the patient to complete the Transitions of Care (Post Inpatient/ED visit) call.   Signature  Robyne Peers, RN

## 2023-12-17 NOTE — Telephone Encounter (Signed)
Copied from CRM [id number]. Topic: General - Other >> Dec 17, 2023 11:22 AM Prudencio Pair wrote: Reason for CRM: Patient's granddaughter, [name], returning call from [place]. CB #: [phone].

## 2023-12-18 ENCOUNTER — Telehealth: Payer: Self-pay

## 2023-12-18 LAB — CULTURE, BLOOD (ROUTINE X 2)
Culture: NO GROWTH
Culture: NO GROWTH
Special Requests: ADEQUATE

## 2023-12-18 NOTE — Transitions of Care (Post Inpatient/ED Visit) (Signed)
   12/18/2023  Name: Anna [name] MRN: [id number] DOB: Dec 05, 1935  Today's TOC FU Call Status: Today's TOC FU Call Status:: Successful TOC FU Call Completed TOC FU Call Complete Date: 12/18/23 Patient's Name and Date of Birth confirmed.  Transition Care Management Follow-up Telephone Call Date of Discharge: 12/16/23 Type of Discharge: Inpatient Admission Primary Inpatient Discharge Diagnosis:: acute cholecystitis due ot biliary calculus How have you been since you were released from the hospital?:  (I spoke to her granddaughter,[name]. She said her grandmother is doing really well, except she is weak from the surgery. She said her grandmother reports some abdominal pain and she has not moved her bowels yet but [name] said they are " working on that.") Any questions or concerns?: Yes Patient Questions/Concerns:: [name] reports a " knot" like cyst on the patient's right wrist Patient Questions/Concerns Addressed: Other: (the information was added to the appointment notes)  Items Reviewed: Did you receive and understand the discharge instructions provided?: Yes Medications obtained,verified, and reconciled?: Partial Review Completed Reason for Partial Mediation Review: [name] said they have all of the medications except the eliquis which she plans to pick up today if the weather is okay otherwise, she will get it tomorrow.  [name] did not have any questions about the med regime and did not need to review the med list. Any new allergies since your discharge?: No Dietary orders reviewed?: Yes Type of Diet Ordered:: heart healthy, low sodium Do you have support at home?: Yes People in Home: grandchild(ren) Name of Support/Comfort Primary Source: She lives alone but her granddaughter, [name], is staying with her post hospitalization.  Medications Reviewed Today: Medications Reviewed Today   Medications were not reviewed in this encounter     Home Care and Equipment/Supplies: Were Home Health  Services Ordered?: Yes Name of Home Health Agency:: [facility] Has Agency set up a time to come to your home?: No EMR reviewed for Home Health Orders:  [name] and the phone number for [facility] and will call to check on the status of the referral.  She understands there may be a delay in the start of care due to the weather/snow) Any new equipment or medical supplies ordered?: No  Functional Questionnaire: Do you need assistance with bathing/showering or dressing?: Yes ([name] assists as needed.. She receives PCS 4 hours/day x 6 days/ week.) Do you need assistance with meal preparation?: Yes Do you need assistance with eating?: No Do you have difficulty maintaining continence: Yes (Needs assistance getting to the commode.  She had been independent with this,) Do you need assistance with getting out of bed/getting out of a chair/moving?: Yes (Needs assistance with transfers. using the wheelchair for mobility due to current weakness) Do you have difficulty managing or taking your medications?: Yes (family is assisting)  Follow up appointments reviewed: PCP Follow-up appointment confirmed?: Yes Date of PCP follow-up appointment?: 12/25/23 Follow-up Provider: Corene Cornea, PA Specialist Hospital Follow-up appointment confirmed?: Yes Date of Specialist follow-up appointment?: 01/08/24 Follow-Up Specialty Provider:: surgeon; 01/12/2024- cardiology Do you need transportation to your follow-up appointment?: No [name] will drive and accompany her to appts.) Do you understand care options if your condition(s) worsen?: Yes-patient verbalized understanding    SIGNATURE Robyne Peers, RN

## 2023-12-19 ENCOUNTER — Telehealth: Payer: Self-pay | Admitting: Cardiology

## 2023-12-19 ENCOUNTER — Telehealth: Payer: Self-pay

## 2023-12-19 NOTE — Telephone Encounter (Signed)
Pt c/o medication issue:  1. Name of Medication: Omega-3 Fatty Acids (FISH OIL CONCENTRATE PO)   2. How are you currently taking this medication (dosage and times per day)?   3. Are you having a reaction (difficulty breathing--STAT)?   4. What is your medication issue? Patient's granddaughter is calling to get clarification on this medication and if the patient is still to take.

## 2023-12-19 NOTE — Telephone Encounter (Signed)
Spoke with pt's daughter (per DPR) regarding her prescription for Omega-3 Fatty Acids (Fish Oil Concentrate PO). Explained that the fish oil works in combination with other cholesterol medications to help lower cholesterol. Pt's daughter verbalized understanding and stated that her mother would continue taking the medication.

## 2023-12-19 NOTE — Telephone Encounter (Signed)
Verbal orders given for patient.  Just FYI nurse states that patient has not has a bowl movement since before her surgery. Nurse informed patient to get Miralax and she was discharged with stool softners.       Copied from CRM [id number]. Topic: Clinical - Home Health Verbal Orders >> Dec 19, 2023  2:41 PM Turkey B wrote: Caller/Agency: Judeth Cornfield from [facility] Number: [phone]  Service Requested: Skilled Nursing Frequency: 1x3 Any new concerns about the patient? no

## 2023-12-24 NOTE — Progress Notes (Unsigned)
 Patient ID: Anna [name], female   DOB: 01-01-1936, 88 y.o.   MRN: [id number]   After hospitalization 2/14 to 12/16/2023  From discharge summary: Please obtain CBC and CMP on follow-up Follow-up with primary care provider Follow-up with general surgery   Discharge Diagnoses: Principal Problem:   Acute cholecystitis due to biliary calculus Active Problems:   Permanent atrial fibrillation (HCC)   Takotsubo cardiomyopathy   Chronic diastolic CHF preserved EF 55 to 60% (congestive heart failure) (HCC)   Chronic pain syndrome   CKD (chronic kidney disease), stage II   Stage 3a chronic kidney disease (HCC)   Ovarian mass, left   Acute cholecystitis     Hospital Course: Taken from H&P.   Anna [name] is a 88 y.o. female with medical history significant of permanent atrial fibrillation, diastolic heart failure preserved EF 55 to 60%, prediabetes, CKD stage II, stress-induced cardiomyopathy, mitral regurgitation, aortic regurgitation, lumbar radiculopathy, osteoarthritis, history of vertebral fracture currently following pain management presented to emergency department complaining of right upper quadrant abdominal pain, poor urinary output and constipation. She denies any fever, chill, nausea, vomiting and diarrhea.   On presentation had significantly elevated blood pressure at 206/92 which was later improved.  CBC unremarkable, CMP with creatinine at 1.15, T. bili 1.7, AST 47, normal lipase at 25. CT abdomen and pelvis with concern of acute calculus cholecystitis with mild intra and extrahepatic biliary ductal dilation.  Also noted to have a stable 4.4 cm solid lobulated left ovarian mass with recommendation to follow-up with gynecology.  CA125 was ordered   RUQ ultrasound with dilated gallbladder, layering of stone, slight wall thickening.   Gastroenterology and general surgery was consulted.  Started on ceftriaxone and Flagyl.   2/16: Hemodynamically stable, progressively worsening  creatinine, currently at 1.41, mild metabolic acidosis with bicarb at 21, T. bili 3.5, alkaline phosphatase 133 and some improvement in AST and ALT.  Preliminary blood cultures negative in 12 hours. ERCP with concern of acute cholecystitis, no definitive choledocholithiasis. GI for possible ERCP today. Cardiology cleared for surgery, Eliquis is being held.  Current plan is for cholecystectomy on Monday.   2/17: Hemodynamically stable, slight worsening of alkaline phosphatase to 162, T. bili at 2, improving creatinine at 1.21.  Going for laparoscopic cholecystectomy today.   2/18: Vital stable.  S/p laparoscopic cholecystectomy, from surgical standpoint she is stable and they cleared for discharge.  Labs with mild hyponatremia at 132 and worsening bicarb at 17, alkaline phosphatase 174 and T. bili 1.6, slight worsening of creatinine at 1.51-giving some more IV fluid.  PT/OT recommended home health.  Patient wants to go home today due to upcoming inclement weather.  Agree to stay to get some IV fluid till later in the afternoon and then go home.   Surgery discontinued antibiotics, no need to continue at this time.  Patient already had pain medications which are being managed by pain management so no new prescriptions were provided.  She was giving some Zofran to use as needed.   Patient was instructed to keep herself well-hydrated, talked with granddaughter who will be her caregiver.  She will continue on current medications and follow-up with her providers closely for further management.   Assessment and Plan: * Acute cholecystitis due to biliary calculus General Surgery and GI is on board.   Going for laparoscopic cholecystectomy today -Received Flagyl and ceftriaxone which were discontinued on 2/18 and no need for further antibiotics per surgery -Continue with supportive care   Permanent atrial  fibrillation (HCC) -Continue home Coreg -Can resume Eliquis at home -Continue to monitor    Chronic diastolic CHF preserved EF 55 to 60% (congestive heart failure) (HCC) History of Takotsubo cardiomyopathy Cardiology clearance was obtained for surgery -Continue Coreg and restarting Entresto and spironolactone   Chronic pain syndrome Chronic osteoarthritis of bilateral knee -Continue home Norco 4 times daily as needed.   CKD (chronic kidney disease), stage II Mild AKI on admission, creatinine seems improving. -Monitor renal function -Avoid nephrotoxin   Ovarian mass, left CT abdomen pelvis showed incidental finding of 4.4 cm solid lobulated mass of the left ovary concern for benign versus ovarian neoplasm.  Radiology recommended gynecology consult. Transabdominal pelvic ultrasound did not show any mass -CA125-normal Surgeon did look for ovaries during cholecystectomy and they appear as polycystic ovaries.  Patient can follow-up with outpatient gynecology for further assistance.

## 2023-12-25 ENCOUNTER — Encounter: Payer: Self-pay | Admitting: Physician Assistant

## 2023-12-25 ENCOUNTER — Ambulatory Visit: Payer: 59 | Attending: Physician Assistant | Admitting: Physician Assistant

## 2023-12-25 VITALS — BP 169/72 | HR 43

## 2023-12-25 DIAGNOSIS — N838 Other noninflammatory disorders of ovary, fallopian tube and broad ligament: Secondary | ICD-10-CM | POA: Diagnosis not present

## 2023-12-25 DIAGNOSIS — Z09 Encounter for follow-up examination after completed treatment for conditions other than malignant neoplasm: Secondary | ICD-10-CM | POA: Diagnosis not present

## 2023-12-25 DIAGNOSIS — N1831 Chronic kidney disease, stage 3a: Secondary | ICD-10-CM

## 2023-12-25 DIAGNOSIS — M67431 Ganglion, right wrist: Secondary | ICD-10-CM

## 2023-12-25 DIAGNOSIS — I5020 Unspecified systolic (congestive) heart failure: Secondary | ICD-10-CM

## 2023-12-25 DIAGNOSIS — K8000 Calculus of gallbladder with acute cholecystitis without obstruction: Secondary | ICD-10-CM | POA: Diagnosis not present

## 2023-12-25 DIAGNOSIS — I5181 Takotsubo syndrome: Secondary | ICD-10-CM

## 2023-12-25 NOTE — Patient Instructions (Signed)
 Removing a Pocket of Fluid at a Joint (Ganglion Cyst Removal): What to Expect A pocket of fluid near a joint is called a ganglion cyst. These cysts often form on joints in your hand or wrist. They can also form: On tendons, which are tissues that connect muscle to bone. On ligaments, which are tissues that connect bones to each other. On joints in your shoulder, elbow, hip, knee, ankle, or foot. You may need surgery to remove the cyst if other treatments haven't worked. Surgery may be done through a few small cuts (minimally invasive surgery) or through one larger cut (open surgery). It may be done if: The cyst is really big. The cyst hurts or makes it hard for you to move. The cyst pushes on a nerve. This can cause tingling or weakness. The cyst comes back after the fluid is drained out of it. Tell your health care provider about: Any allergies you have. All medicines you take. These include vitamins, herbs, eye drops, and creams. Any problems you or family members have had with anesthesia. Any bleeding problems you have. Any surgeries you've had. Any medical conditions you have. Whether you're pregnant or may be pregnant. What are the risks? Your health care provider will talk with you about risks. These may include: Infection. Bleeding. Damage to nearby structures. These include tendons, nerves, and blood vessels. Stiffness or not being able to move as well as you should. The cyst coming back. Allergic reactions to medicines. What happens before the surgery? When to stop eating and drinking Eat and drink only as you've been told. You may be told this: 8 hours before your surgery Stop eating most foods. Do not eat meat, fried foods, or fatty foods. Eat only light foods, such as toast or crackers. All liquids are OK except energy drinks and alcohol. 6 hours before your surgery Stop eating. Drink only clear liquids, such as water, clear fruit juice, black coffee, plain tea, and  sports drinks. Do not drink energy drinks or alcohol. 2 hours before your surgery Stop drinking all liquids. You may be allowed to take medicines with small sips of water. If you do not eat and drink as told, your surgery may be delayed or canceled. Medicines Ask about changing or stopping: Any medicines you take. Any vitamins, herbs, or supplements you take. Do not take aspirin or ibuprofen unless you're told to. Surgery safety For your safety, you may: Need to wash your skin with a soap that kills germs. Get antibiotics. Have your surgery site marked. Have hair removed at the surgery site. Tests You may have tests. These may include: An X-ray. An MRI. A CT scan. An ultrasound. General instructions Ask if you'll be staying overnight in the hospital. If you'll be going home right after the surgery, plan to have a responsible adult: Drive you home from the hospital or clinic. You won't be allowed to drive. Stay with you for the time you're told. What happens during the surgery?  An IV will be put into a vein in your hand or arm. You may be given: A sedative to help you relax. Anesthesia to keep you from feeling pain. If you're having minimally invasive surgery: Two or more small cuts will be made near the cyst. A thin tube called an arthroscope with a camera on the end will be put through one of the cuts. Tools will be put through the other cuts. Pictures will be sent to a screen in the room. These will be  used to guide the surgery. If you're having open surgery, a cut will be made to take out the cyst. All parts of the cyst will be taken out. These include: The fluid. The cyst wall. The stalk that connects the cyst to the tissue. In some cases, a piece of joint tissue, tendon, or ligament may also be taken out. Cuts will be closed with stitches, skin glue, or tape strips. A bandage will be placed. These steps may vary. Ask what you can expect. What happens after the  surgery? You will be watched closely until you leave. This includes checking your pain level, blood pressure, heart rate, and breathing rate. You may have to wear a splint or brace for a few days. If you were given a sedative, do not drive or use machines until you're told it's safe. A sedative can make you sleepy. Where to find more information American Society for Surgery of the Hand (ASSH): assh.org This information is not intended to replace advice given to you by your health care provider. Make sure you discuss any questions you have with your health care provider. Document Revised: 04/11/2023 Document Reviewed: 04/11/2023 Elsevier Patient Education  2024 [employer].

## 2023-12-26 ENCOUNTER — Other Ambulatory Visit: Payer: Self-pay | Admitting: Physician Assistant

## 2023-12-26 ENCOUNTER — Telehealth: Payer: Self-pay

## 2023-12-26 ENCOUNTER — Telehealth: Payer: Self-pay | Admitting: Cardiology

## 2023-12-26 DIAGNOSIS — N1831 Chronic kidney disease, stage 3a: Secondary | ICD-10-CM

## 2023-12-26 DIAGNOSIS — E875 Hyperkalemia: Secondary | ICD-10-CM

## 2023-12-26 LAB — CMP14+EGFR
ALT: 10 [IU]/L (ref 0–32)
AST: 22 [IU]/L (ref 0–40)
Albumin: 4.2 g/dL (ref 3.7–4.7)
Alkaline Phosphatase: 145 [IU]/L — ABNORMAL HIGH (ref 44–121)
BUN/Creatinine Ratio: 12 (ref 12–28)
BUN: 13 mg/dL (ref 8–27)
Bilirubin Total: 0.7 mg/dL (ref 0.0–1.2)
CO2: 26 mmol/L (ref 20–29)
Calcium: 9.7 mg/dL (ref 8.7–10.3)
Chloride: 97 mmol/L (ref 96–106)
Creatinine, Ser: 1.13 mg/dL — ABNORMAL HIGH (ref 0.57–1.00)
Globulin, Total: 2.2 g/dL (ref 1.5–4.5)
Glucose: 103 mg/dL — ABNORMAL HIGH (ref 70–99)
Potassium: 5.8 mmol/L — ABNORMAL HIGH (ref 3.5–5.2)
Sodium: 138 mmol/L (ref 134–144)
Total Protein: 6.4 g/dL (ref 6.0–8.5)
eGFR: 47 mL/min/{1.73_m2} — ABNORMAL LOW (ref >59)

## 2023-12-26 LAB — CBC WITH DIFFERENTIAL/PLATELET
Basophils Absolute: 0.1 10*3/uL (ref 0.0–0.2)
Basos: 2 %
EOS (ABSOLUTE): 0.2 10*3/uL (ref 0.0–0.4)
Eos: 3 %
Hematocrit: 31.6 % — ABNORMAL LOW (ref 34.0–46.6)
Hemoglobin: 10 g/dL — ABNORMAL LOW (ref 11.1–15.9)
Immature Grans (Abs): 0 10*3/uL (ref 0.0–0.1)
Immature Granulocytes: 0 %
Lymphocytes Absolute: 1.4 10*3/uL (ref 0.7–3.1)
Lymphs: 27 %
MCH: 28.7 pg (ref 26.6–33.0)
MCHC: 31.6 g/dL (ref 31.5–35.7)
MCV: 91 fL (ref 79–97)
Monocytes Absolute: 0.5 10*3/uL (ref 0.1–0.9)
Monocytes: 10 %
Neutrophils Absolute: 2.9 10*3/uL (ref 1.4–7.0)
Neutrophils: 58 %
Platelets: 310 10*3/uL (ref 150–450)
RBC: 3.49 x10E6/uL — ABNORMAL LOW (ref 3.77–5.28)
RDW: 12.8 % (ref 11.7–15.4)
WBC: 5 10*3/uL (ref 3.4–10.8)

## 2023-12-26 MED ORDER — FERROUS SULFATE 325 (65 FE) MG PO TABS: 325.0000 mg | ORAL_TABLET | ORAL | 1 refills | Status: DC

## 2023-12-26 MED ORDER — LOKELMA 5 G PO PACK: 5.0000 g | PACK | Freq: Every day | ORAL | 2 refills | Status: DC

## 2023-12-26 NOTE — Telephone Encounter (Signed)
 Pt c/o medication issue:  1. Name of Medication:   Omega-3 Fatty Acids (FISH OIL CONCENTRATE PO)   2. How are you currently taking this medication (dosage and times per day)?   Not taking  3. Are you having a reaction (difficulty breathing--STAT)?   4. What is your medication issue?   Granddaughter [name]) stated patient had stopped taking this medication and wants to know if patient can start back taking this medication.

## 2023-12-26 NOTE — Telephone Encounter (Signed)
 Called patient's granddaughter back. She stated Patient just recently had gallbladder surgery, and she stopped before then, at least 2 months patient has been off the Omega-3 fatty acids. Will forward to Dr. Mayford Knife.

## 2023-12-26 NOTE — Telephone Encounter (Signed)
 Pt was called and is aware of results, DOB was confirmed.  ?

## 2023-12-26 NOTE — Telephone Encounter (Signed)
-----   Message from Georgian Co sent at 12/26/2023 11:43 AM EST ----- Please call patient-increase water intake as discussed.  her potassium is elevated.  I have sent a medication for her to take daily to help with this.  I have restarted iron tablets every other day to keep her hemoglobin stable.  Liver function is normal.  I want her to have a lab appt in 2 to 3 weeks to recheck her potassium.  Thanks, Georgian Co, PA-C

## 2023-12-29 ENCOUNTER — Other Ambulatory Visit: Payer: Self-pay

## 2023-12-29 NOTE — Telephone Encounter (Signed)
 Anna Reichert, MD  Ethelda Chick, RN17 hours ago (6:24 PM)    No need to go back on fish oil at this time  Pt and her granddaughter advised and will keep 01/12/24 OV.

## 2023-12-30 NOTE — Telephone Encounter (Signed)
 FYI    Called patient granddaughter and she is aware   States that she has a appointment with Dr.Turner On the 17th   And she has started her potassium pill

## 2023-12-31 ENCOUNTER — Other Ambulatory Visit: Payer: Self-pay

## 2023-12-31 NOTE — Telephone Encounter (Signed)
Noted.  No problem. 

## 2024-01-01 ENCOUNTER — Other Ambulatory Visit: Payer: Self-pay

## 2024-01-02 ENCOUNTER — Other Ambulatory Visit: Payer: Self-pay

## 2024-01-12 ENCOUNTER — Other Ambulatory Visit: Payer: Self-pay

## 2024-01-12 ENCOUNTER — Ambulatory Visit: Payer: 59 | Attending: Cardiology | Admitting: Cardiology

## 2024-01-12 ENCOUNTER — Encounter: Payer: Self-pay | Admitting: Cardiology

## 2024-01-12 ENCOUNTER — Encounter: Payer: Self-pay | Admitting: Surgical

## 2024-01-12 ENCOUNTER — Ambulatory Visit (INDEPENDENT_AMBULATORY_CARE_PROVIDER_SITE_OTHER): Admitting: Surgical

## 2024-01-12 VITALS — BP 128/50 | HR 65 | Resp 16 | Ht 64.0 in | Wt 152.0 lb

## 2024-01-12 DIAGNOSIS — I050 Rheumatic mitral stenosis: Secondary | ICD-10-CM

## 2024-01-12 DIAGNOSIS — I340 Nonrheumatic mitral (valve) insufficiency: Secondary | ICD-10-CM

## 2024-01-12 DIAGNOSIS — I10 Essential (primary) hypertension: Secondary | ICD-10-CM

## 2024-01-12 DIAGNOSIS — I4821 Permanent atrial fibrillation: Secondary | ICD-10-CM

## 2024-01-12 DIAGNOSIS — M67431 Ganglion, right wrist: Secondary | ICD-10-CM | POA: Diagnosis not present

## 2024-01-12 DIAGNOSIS — I5181 Takotsubo syndrome: Secondary | ICD-10-CM

## 2024-01-12 DIAGNOSIS — I5022 Chronic systolic (congestive) heart failure: Secondary | ICD-10-CM

## 2024-01-12 NOTE — Progress Notes (Signed)
 Office Visit Note   Patient: Anna [name]           Date of Birth: 1936/10/02           MRN: [id number] Visit Date: 01/12/2024 Requested by: Anders Simmonds, PA-C [address] [place],  [place] [address] PCP: Marcine Matar, MD  Subjective: Chief Complaint  Patient presents with   Other    Ganglion cyst on hand    HPI: Anna [name] is a 88 y.o. female who presents to the office reporting right hand bump.  Patient states that she has never really had difficulty with her hand or wrist.  She noticed 2 weeks ago a sudden onset of a bump on the dorsum of her right hand.  She has not had any pain associated with this.  No fevers or chills.  No numbness or tingling.  No history of prior surgery or any fracture to the right hand/wrist region.  She is right-hand dominant.  It does not limit her activities in any way.  She does not find it concerning, she is more curious about what this is..                ROS: All systems reviewed are negative as they relate to the chief complaint within the history of present illness.  Patient denies fevers or chills.  Assessment & Plan: Visit Diagnoses:  1. Ganglion cyst of wrist, right     Plan: Impression is 88 year old female who presents with new onset ganglion cyst of the right wrist.  This is causing her no symptoms and not bothering her from a cosmetic standpoint to the point that she wants to pursue any intervention.  We did discuss potential interventions such as aspiration versus excision but since it is not bothering her, we will hold off on anything for now.  If she starts having more symptoms or decides she would like to try having it removed/aspirated, she is welcome to return as needed.  Follow-Up Instructions: No follow-ups on file.   Orders:  No orders of the defined types were placed in this encounter.  No orders of the defined types were placed in this encounter.     Procedures: No procedures  performed   Clinical Data: No additional findings.  Objective: Vital Signs: There were no vitals taken for this visit.  Physical Exam:  Constitutional: Patient appears well-developed HEENT:  Head: Normocephalic Eyes:EOM are normal Neck: Normal range of motion Cardiovascular: Normal rate Pulmonary/chest: Effort normal Neurologic: Patient is alert Skin: Skin is warm Psychiatric: Patient has normal mood and affect  Ortho Exam: Ortho exam demonstrates right hand with palpable radial pulse rated 2+.  She has intact EPL, FPL, finger abduction, grip strength testing without weakness.  There is a cystic structure noted on the dorsum of the right wrist that is mobile and nontender.  This is consistent with ganglion cyst.  It becomes more prominent when the wrist is flexed.  There is no tenderness throughout the radiocarpal joint or the first [facility] joint and really no pain with any range of motion of her thumb or wrist.  There is no erythema or any concern for infection.  Specialty Comments:  No specialty comments available.  Imaging: No results found.   PMFS History: Patient Active Problem List   Diagnosis Date Noted   Acute cholecystitis due to biliary calculus 12/13/2023   Ovarian mass, left 12/13/2023   Acute cholecystitis 12/13/2023   CKD (chronic kidney  disease), stage II 12/13/2023   Chronic diastolic CHF preserved EF 55 to 60% (congestive heart failure) (HCC) 12/13/2023   Chronic pain syndrome 12/13/2023   Takotsubo cardiomyopathy 07/09/2023   Elevated troponin    Acute heart failure [facility])    NSTEMI (non-ST elevated myocardial infarction) (HCC) 08/17/2022   Mitral stenosis 05/07/2021   Recurrent falls 04/24/2021   History of vertebral fracture 04/24/2021   Lumbar radiculopathy 04/24/2021   Anemia due to stage 3a chronic kidney disease (HCC) 03/07/2020   Stage 3a chronic kidney disease (HCC) 03/07/2020   Mixed incontinence urge and stress 03/09/2018   Prediabetes  03/09/2018   OA (osteoarthritis) of knee 03/09/2018   Sinus bradycardia 12/18/2016   Estrogen deficiency 07/03/2016   Mitral regurgitation 03/01/2014   Chronic atrial flutter (HCC) 03/01/2014   Permanent atrial fibrillation (HCC) 12/24/2013   Essential hypertension, benign 12/24/2013   Heart murmur 12/24/2013   Hyperlipemia 12/24/2013   Past Medical History:  Diagnosis Date   Dysrhythmia    Hyperlipidemia    Hypertension    Mild CAD    Mitral regurgitation    trivial   Mitral stenosis    mild   Osteoarthritis    Overweight(278.02)    Permanent atrial fibrillation (HCC)    Takotsubo cardiomyopathy     Family History  Problem Relation Age of Onset   Alzheimer's disease Mother     Past Surgical History:  Procedure Laterality Date   CARDIOVERSION N/A 03/24/2014   Procedure: CARDIOVERSION;  Surgeon: Quintella Reichert, MD;  Location: [facility] ENDOSCOPY;  Service: Cardiovascular;  Laterality: N/A;   CHOLECYSTECTOMY N/A 12/15/2023   Procedure: LAPAROSCOPIC CHOLECYSTECTOMY;  Surgeon: Abigail Miyamoto, MD;  Location: [facility] OR;  Service: General;  Laterality: N/A;   ESOPHAGOGASTRODUODENOSCOPY (EGD) WITH PROPOFOL N/A 12/14/2023   Procedure: ESOPHAGOGASTRODUODENOSCOPY (EGD) WITH PROPOFOL;  Surgeon: Jeani Hawking, MD;  Location: [facility] ENDOSCOPY;  Service: Gastroenterology;  Laterality: N/A;   EUS N/A 12/14/2023   Procedure: UPPER ENDOSCOPIC ULTRASOUND (EUS) LINEAR;  Surgeon: Jeani Hawking, MD;  Location: [facility] ENDOSCOPY;  Service: Gastroenterology;  Laterality: N/A;   LEFT HEART CATH AND CORONARY ANGIOGRAPHY N/A 08/20/2022   Procedure: LEFT HEART CATH AND CORONARY ANGIOGRAPHY;  Surgeon: Corky Crafts, MD;  Location: [facility] INVASIVE CV LAB;  Service: Cardiovascular;  Laterality: N/A;   Social History   Occupational History   Occupation: retired  Tobacco Use   Smoking status: Never   Smokeless tobacco: Never  Vaping Use   Vaping status: Never Used  Substance and Sexual Activity   Alcohol use: No    Drug use: No   Sexual activity: Not on file

## 2024-01-12 NOTE — Addendum Note (Signed)
 Addended by: Frutoso Schatz on: 01/12/2024 01:45 PM   Modules accepted: Orders

## 2024-01-12 NOTE — Patient Instructions (Signed)
 Medication Instructions:  Your physician recommends that you continue on your current medications as directed. Please refer to the Current Medication list given to you today.  *If you need a refill on your cardiac medications before your next appointment, please call your pharmacy*  Lab Work: TODAY: BMET  Testing/Procedures: Echocardiogram - in March 2026 Your physician has requested that you have an echocardiogram. Echocardiography is a painless test that uses sound waves to create images of your heart. It provides your doctor with information about the size and shape of your heart and how well your heart's chambers and valves are working. This procedure takes approximately one hour. There are no restrictions for this procedure. Please do NOT wear cologne, perfume, aftershave, or lotions (deodorant is allowed). Please arrive 15 minutes prior to your appointment time.  Please note: We ask at that you not bring children with you during ultrasound (echo/ vascular) testing. Due to room size and safety concerns, children are not allowed in the ultrasound rooms during exams. Our front office staff cannot provide observation of children in our lobby area while testing is being conducted. An adult accompanying a patient to their appointment will only be allowed in the ultrasound room at the discretion of the ultrasound technician under special circumstances. We apologize for any inconvenience.  Follow-Up: At [facility], you and your health needs are our priority.  As part of our continuing mission to provide you with exceptional heart care, we have created designated Provider Care Teams.  These Care Teams include your primary Cardiologist (physician) and Advanced Practice Providers (APPs -  Physician Assistants and Nurse Practitioners) who all work together to provide you with the care you need, when you need it.  Your next appointment:   1 year  Provider:   Armanda Magic, MD

## 2024-01-12 NOTE — Progress Notes (Signed)
 Date:  01/12/2024   ID:  Anna [name], DOB 20-Jun-1936, MRN [id number]   PCP:  Marcine Matar, MD  Cardiologist:  Armanda Magic, MD  Electrophysiologist:  None   Chief Complaint:  AF, HTN  History of Present Illness:    Anna [name] is a 88 y.o. female with a hx of HTN, permanent  atrial fibrillation on Xarelto due to her CHADS2VASC score of 3 and mild MR and mild mitral stenosis by echo 7-22.   She presented to the emergency room with acute respiratory failure and hypoxemia secondary to right lower lobe pneumonia and acute systolic heart failure was diagnosed.  She ruled in for NSTEMI with troponin of 5091.  BP was markedly elevated during this time as her granddaughter had stopped her BP medications a week prior because the patient was feeling ill.  EF showed EF 25% with an akinetic mid to distal anterior septum and entire apex with mild RV dysfunction, moderate biatrial enlargement, mild MR and mild to moderate TR.    Left heart cath demonstrated minimal luminal irregularities in the LAD and LVEDP 20 mmHg.  It was felt she had a stress MI/Takotsubo cardiomyopathy.  She was treated with IV Lasix and started on GDMT P with low-dose carvedilol 3.25 mg twice daily, Entresto 24-26 mg twice daily and ultimately at outpatient visit: Spironolactone 12.5 mg daily.  She is also on Farxiga 10 mg daily.  At discharge her weight was 184 pounds and was down to 171 pounds in [facility] heart failure clinic on 09/04/2022.    She was seen by Tereso Newcomer 07/09/2023 and was actually doing quite well.She was hospitalized February 2025 with acute cholecystitis and choledocholithiasis and cardiology saw her for preoperative clearance.  She did well after that.  She is here today for followup and is doing well.  She denies any chest pain or pressure, SOB, DOE, PND, orthopnea, LE edema,  palpitations or syncope. She does have LE edema from time to time that is controlled on PRN diuretics. She is compliant with her  meds and is tolerating meds with no SE.     Prior CV studies:   The following studies were reviewed today:  none  Past Medical History:  Diagnosis Date   Dysrhythmia    Hyperlipidemia    Hypertension    Mild CAD    Mitral regurgitation    trivial   Mitral stenosis    mild   Osteoarthritis    Overweight(278.02)    Permanent atrial fibrillation (HCC)    Takotsubo cardiomyopathy    Past Surgical History:  Procedure Laterality Date   CARDIOVERSION N/A 03/24/2014   Procedure: CARDIOVERSION;  Surgeon: Quintella Reichert, MD;  Location: [facility] ENDOSCOPY;  Service: Cardiovascular;  Laterality: N/A;   CHOLECYSTECTOMY N/A 12/15/2023   Procedure: LAPAROSCOPIC CHOLECYSTECTOMY;  Surgeon: Abigail Miyamoto, MD;  Location: [facility] OR;  Service: General;  Laterality: N/A;   ESOPHAGOGASTRODUODENOSCOPY (EGD) WITH PROPOFOL N/A 12/14/2023   Procedure: ESOPHAGOGASTRODUODENOSCOPY (EGD) WITH PROPOFOL;  Surgeon: Jeani Hawking, MD;  Location: [facility] ENDOSCOPY;  Service: Gastroenterology;  Laterality: N/A;   EUS N/A 12/14/2023   Procedure: UPPER ENDOSCOPIC ULTRASOUND (EUS) LINEAR;  Surgeon: Jeani Hawking, MD;  Location: [facility] ENDOSCOPY;  Service: Gastroenterology;  Laterality: N/A;   LEFT HEART CATH AND CORONARY ANGIOGRAPHY N/A 08/20/2022   Procedure: LEFT HEART CATH AND CORONARY ANGIOGRAPHY;  Surgeon: Corky Crafts, MD;  Location: [facility] INVASIVE CV LAB;  Service: Cardiovascular;  Laterality: N/A;     Current Meds  Medication Sig   amlodipine-olmesartan (AZOR) 10-20 MG tablet Take 1 tablet by mouth daily.   carvedilol (COREG) 3.125 MG tablet Take 1 tablet (3.125 mg total) by mouth 2 (two) times daily with a meal.   Cholecalciferol (VITAMIN D3) 10 MCG (400 UNIT) tablet Take 2 tablets (800 Units total) by mouth daily.   cyclobenzaprine (FLEXERIL) 10 MG tablet Take 0.5 tablets (5 mg total) by mouth 2 (two) times daily as needed for muscle spasms.   dapagliflozin propanediol (FARXIGA) 10 MG TABS tablet TAKE 1 TABLET BY  MOUTH EVERY DAY   diclofenac Sodium (VOLTAREN) 1 % GEL Apply 2 grams to knees 4 times a day as needed. (Patient taking differently: Apply 2 g topically 4 (four) times daily as needed (for knee pain). Apply 2 grams to knees 4 times a day as needed.)   docusate sodium (COLACE) 100 MG capsule Take 100 mg by mouth daily.    ELIQUIS 5 MG TABS tablet TAKE 1 TABLET BY MOUTH TWICE A DAY   ferrous sulfate 325 (65 FE) MG tablet Take 1 tablet (325 mg total) by mouth every other day.   furosemide (LASIX) 20 MG tablet Take 1 tablet (20 mg total) by mouth as needed. (Patient taking differently: Take 20 mg by mouth as needed for edema.)   HYDROcodone-acetaminophen (NORCO) 7.5-325 MG tablet Take 1 tablet by mouth 4 (four) times daily as needed.   Misc. Devices Event organiser. Devices MISC Rollator walker   Misc. Devices MISC Hospital bed   naloxone [facility]) nasal spray 4 mg/0.1 mL Place 1 spray into the nose once.   Omega-3 Fatty Acids (FISH OIL CONCENTRATE PO) Take 1 capsule by mouth daily.    ondansetron (ZOFRAN) 4 MG tablet Take 1 tablet (4 mg total) by mouth every 6 (six) hours as needed for nausea.   rosuvastatin (CRESTOR) 10 MG tablet TAKE 1 TABLET BY MOUTH EVERY MONDAY, WEDNESDAY, FRIDAY, AND SATURDAY   sacubitril-valsartan (ENTRESTO) 24-26 MG TAKE 1 TABLET BY MOUTH TWICE A DAY   sodium zirconium cyclosilicate (LOKELMA) 5 g packet Take 5 g by mouth daily.   spironolactone (ALDACTONE) 25 MG tablet TAKE 1/2 TABLET BY MOUTH EVERY DAY   White Petrolatum-Mineral Oil (SYSTANE NIGHTTIME) OINT Place 1 drop into the left eye 4 (four) times daily.     Allergies:   Patient has no known allergies.   Social History   Tobacco Use   Smoking status: Never   Smokeless tobacco: Never  Vaping Use   Vaping status: Never Used  Substance Use Topics   Alcohol use: No   Drug use: No     Family Hx: The patient's family history includes Alzheimer's disease in her mother.  ROS:   Please see the  history of present illness.     All other systems reviewed and are negative.   Labs/Other Tests and Data Reviewed:    Recent Labs: 12/25/2023: ALT 10; BUN 13; Creatinine, Ser 1.13; Hemoglobin 10.0; Platelets 310; Potassium 5.8; Sodium 138   Recent Lipid Panel Lab Results  Component Value Date/Time   CHOL 100 10/02/2023 04:39 PM   TRIG 71 10/02/2023 04:39 PM   HDL 44 10/02/2023 04:39 PM   CHOLHDL 2.3 10/02/2023 04:39 PM   CHOLHDL 2.1 08/18/2022 06:20 AM   LDLCALC 41 10/02/2023 04:39 PM    Wt Readings from Last 3 Encounters:  01/12/24 152 lb (68.9 kg)  12/16/23 162 lb 11.2 oz (73.8 kg)  10/02/23 150 lb (68 kg)  Objective:    Vital Signs:  BP (!) 128/50 (BP Location: Left Arm, Patient Position: Sitting, Cuff Size: Large)   Pulse 65   Resp 16   Ht 5\' 4"  (1.626 m)   Wt 152 lb (68.9 kg)   SpO2 92%   BMI 26.09 kg/m    GEN: Well nourished, well developed in no acute distress HEENT: Normal NECK: No JVD; No carotid bruits LYMPHATICS: No lymphadenopathy CARDIAC:RRR, no murmurs, rubs, gallops RESPIRATORY:  Clear to auscultation without rales, wheezing or rhonchi  ABDOMEN: Soft, non-tender, non-distended MUSCULOSKELETAL:  No edema; No deformity  SKIN: Warm and dry NEUROLOGIC:  Alert and oriented x 3 PSYCHIATRIC:  Normal affect  ASSESSMENT & PLAN:    1.  Permanent Atrial Fibrillation -She remains in atrial fibrillation with good heart rate control.  She denies any palpitations -No bleeding problems on DOAC -I have personally reviewed and interpreted outside PCP performed by patient's PCP which showed SCr 1.13, K+ 5.8 on 12/25/2023 and Hbg 11.9 12/16/2023 -Continue prescription drug management with carvedilol 3.125 mg twice daily and Eliquis 5mg  BID with as needed refills  2.  HTN -BP controlled on exam today -continue prescription drug management with Carvedilol 3.125mg  BID, Entresto 24-26mg  BID and spiro 12.5mg  daily with PRN refills  3.  Mitral regurgitation/mitral  stenosis -2D echo 7/22 showed normal LV function with trivial MR and mild mitral stenosis -Repeat echo 7/23 showed no mitral stenosis and only mild MR -echo 09/2023 showed normal LVF with severe BAE, mild mitral stenosis with mean MVG 4.87mmHg.  4.  Dilated cardiomyopathy/chronic combined systolic/diastolic CHF -S/P  NSTEMI and found to have stress-induced cardiomyopathy.   -Cath showed mild nonobstructive CAD with minimal luminal irregularities in the LAD  -2D echo demonstrated severe LV dysfunction with EF 25%, mildly reduced RV systolic function with mild PA systolic hypertension, moderate biatrial enlargement, mild MR, mild TR and mid to distal anterior septal and apical akinesis consistent with Takotsubo cardiomyopathy  -Repeat 2D echo 10/17/2023 showed resolution of LV dysfunction with EF 55 to 60%  -She appears euvolemic on exam today  -nephrology manages diuretics -Continue prescription drug management with Carvedilol 3.125 mg twice daily, Farxiga 10 mg daily, Lasix 20 mg as needed as needed for edema, Entresto 24-26 mg twice daily, spironolactone 12.5 mg daily with as needed refills -Patient for some reason was given Amlodipine/olmesartan as well as entresto in the hospital for her BP and was hyperkalemic>>her granddaughter dose not think she is getting it any more and is getting her labs rechecked in a few week -I am going to check her BMET today and her granddaughter will let her PCP know about this   Followup with me in 1 year  Medication Adjustments/Labs and Tests Ordered: Current medicines are reviewed at length with the patient today.  Concerns regarding medicines are outlined above.  Tests Ordered: No orders of the defined types were placed in this encounter.  Medication Changes: No orders of the defined types were placed in this encounter.   Disposition:  Follow up in 1 year(s)  Signed, Armanda Magic, MD  01/12/2024 1:34 PM    [facility]

## 2024-01-13 LAB — BASIC METABOLIC PANEL
BUN/Creatinine Ratio: 24 (ref 12–28)
BUN: 26 mg/dL (ref 8–27)
CO2: 24 mmol/L (ref 20–29)
Calcium: 9.7 mg/dL (ref 8.7–10.3)
Chloride: 96 mmol/L (ref 96–106)
Creatinine, Ser: 1.09 mg/dL — ABNORMAL HIGH (ref 0.57–1.00)
Glucose: 103 mg/dL — ABNORMAL HIGH (ref 70–99)
Potassium: 4.4 mmol/L (ref 3.5–5.2)
Sodium: 135 mmol/L (ref 134–144)
eGFR: 49 mL/min/{1.73_m2} — ABNORMAL LOW (ref >59)

## 2024-01-23 ENCOUNTER — Ambulatory Visit: Payer: 59 | Attending: Family Medicine

## 2024-01-23 DIAGNOSIS — E875 Hyperkalemia: Secondary | ICD-10-CM

## 2024-01-24 LAB — BASIC METABOLIC PANEL WITH GFR
BUN/Creatinine Ratio: 22 (ref 12–28)
BUN: 27 mg/dL (ref 8–27)
CO2: 23 mmol/L (ref 20–29)
Calcium: 9.6 mg/dL (ref 8.7–10.3)
Chloride: 95 mmol/L — ABNORMAL LOW (ref 96–106)
Creatinine, Ser: 1.22 mg/dL — ABNORMAL HIGH (ref 0.57–1.00)
Glucose: 114 mg/dL — ABNORMAL HIGH (ref 70–99)
Potassium: 5.1 mmol/L (ref 3.5–5.2)
Sodium: 133 mmol/L — ABNORMAL LOW (ref 134–144)
eGFR: 43 mL/min/{1.73_m2} — ABNORMAL LOW (ref >59)

## 2024-01-28 ENCOUNTER — Telehealth (INDEPENDENT_AMBULATORY_CARE_PROVIDER_SITE_OTHER): Payer: Self-pay

## 2024-01-28 NOTE — Telephone Encounter (Signed)
 Error

## 2024-02-03 ENCOUNTER — Other Ambulatory Visit: Payer: Self-pay

## 2024-02-05 ENCOUNTER — Other Ambulatory Visit: Payer: Self-pay

## 2024-02-13 ENCOUNTER — Other Ambulatory Visit (HOSPITAL_BASED_OUTPATIENT_CLINIC_OR_DEPARTMENT_OTHER): Payer: Self-pay | Admitting: Nurse Practitioner

## 2024-02-13 DIAGNOSIS — R3129 Other microscopic hematuria: Secondary | ICD-10-CM

## 2024-02-13 DIAGNOSIS — Z780 Asymptomatic menopausal state: Secondary | ICD-10-CM

## 2024-02-18 ENCOUNTER — Telehealth: Payer: Self-pay | Admitting: Internal Medicine

## 2024-02-18 NOTE — Telephone Encounter (Signed)
 I have received a form from [employer] to renew CAP services. I will leave form on your desk. Pt's med list and list of diagnosis must accompany form to be faxed back.  Please confirm with pt or her grand-daughter Lynnie Saucier that they do want to renew services with [employer] program.

## 2024-02-23 ENCOUNTER — Ambulatory Visit: Admitting: Internal Medicine

## 2024-02-23 ENCOUNTER — Ambulatory Visit (INDEPENDENT_AMBULATORY_CARE_PROVIDER_SITE_OTHER): Payer: 59 | Admitting: Primary Care

## 2024-02-23 NOTE — Telephone Encounter (Signed)
 Form successfully faxed to [employer] on 02/23/2024. Fax #: [phone].

## 2024-02-24 ENCOUNTER — Ambulatory Visit: Payer: 59 | Admitting: Internal Medicine

## 2024-02-24 ENCOUNTER — Ambulatory Visit: Payer: 59 | Attending: Internal Medicine | Admitting: Internal Medicine

## 2024-02-24 ENCOUNTER — Encounter: Payer: Self-pay | Admitting: Internal Medicine

## 2024-02-24 ENCOUNTER — Other Ambulatory Visit: Payer: Self-pay

## 2024-02-24 VITALS — BP 147/56 | HR 42 | Temp 97.9°F | Ht 64.0 in | Wt 155.8 lb

## 2024-02-24 DIAGNOSIS — N1832 Chronic kidney disease, stage 3b: Secondary | ICD-10-CM | POA: Diagnosis not present

## 2024-02-24 DIAGNOSIS — I152 Hypertension secondary to endocrine disorders: Secondary | ICD-10-CM

## 2024-02-24 DIAGNOSIS — Z7984 Long term (current) use of oral hypoglycemic drugs: Secondary | ICD-10-CM

## 2024-02-24 DIAGNOSIS — E119 Type 2 diabetes mellitus without complications: Secondary | ICD-10-CM | POA: Diagnosis not present

## 2024-02-24 DIAGNOSIS — I5022 Chronic systolic (congestive) heart failure: Secondary | ICD-10-CM

## 2024-02-24 DIAGNOSIS — Z9181 History of falling: Secondary | ICD-10-CM

## 2024-02-24 DIAGNOSIS — E1159 Type 2 diabetes mellitus with other circulatory complications: Secondary | ICD-10-CM

## 2024-02-24 DIAGNOSIS — L918 Other hypertrophic disorders of the skin: Secondary | ICD-10-CM

## 2024-02-24 DIAGNOSIS — I4821 Permanent atrial fibrillation: Secondary | ICD-10-CM

## 2024-02-24 DIAGNOSIS — M170 Bilateral primary osteoarthritis of knee: Secondary | ICD-10-CM

## 2024-02-24 LAB — POCT GLYCOSYLATED HEMOGLOBIN (HGB A1C): HbA1c, POC (prediabetic range): 6.3 % (ref 5.7–6.4)

## 2024-02-24 LAB — GLUCOSE, POCT (MANUAL RESULT ENTRY): POC Glucose: 81 mg/dL (ref 70–99)

## 2024-02-24 MED ORDER — DAPAGLIFLOZIN PROPANEDIOL 10 MG PO TABS: 10.0000 mg | ORAL_TABLET | Freq: Every day | ORAL | 2 refills | Status: DC

## 2024-02-24 MED ORDER — DICLOFENAC SODIUM 1 % EX GEL
CUTANEOUS | 2 refills | Status: DC
  Filled 2024-02-24: qty 400, fill #0
  Filled 2024-03-23: qty 400, 50d supply, fill #0
  Filled 2024-06-21: qty 200, 25d supply, fill #1
  Filled 2024-08-02 (×2): qty 200, 25d supply, fill #2
  Filled 2024-09-06: qty 200, 25d supply, fill #3
  Filled 2024-10-26: qty 200, 25d supply, fill #4

## 2024-02-24 MED ORDER — DICLOFENAC SODIUM 1 % EX GEL: CUTANEOUS | 2 refills | Status: DC

## 2024-02-24 MED ORDER — CARVEDILOL 3.125 MG PO TABS: 3.1250 mg | ORAL_TABLET | Freq: Two times a day (BID) | ORAL | 1 refills | Status: DC

## 2024-02-24 NOTE — Patient Instructions (Signed)
 VISIT SUMMARY:  During your follow-up visit after gallbladder removal, we discussed your weight changes, diabetes management, blood pressure, and recent fall. Your kidney function remains stable, and you have upcoming appointments with your nephrologist and pain specialist.  YOUR PLAN:  -ATRIAL FIBRILLATION: Atrial fibrillation is an irregular and often rapid heart rate that can increase the risk of strokes, heart failure, and other heart-related complications. Continue taking Eliquis  to prevent blood clots, and follow fall prevention strategies to reduce the risk of injury.  -HYPERTENSION: Hypertension is high blood pressure, which can lead to serious health issues if not managed properly. Continue taking carvedilol , Entresto , and spironolactone  as prescribed, and monitor your blood pressure at home.  -TYPE 2 DIABETES MELLITUS: Type 2 diabetes is a condition that affects the way your body processes blood sugar. You are currently taking Farxiga . We checked your blood sugar during the visit and discussed the importance of regular blood sugar monitoring. Consider obtaining a home device to monitor your blood sugar levels regularly.  -CHRONIC KIDNEY DISEASE: Chronic kidney disease is a long-term condition where the kidneys do not work as well as they should. Your kidney function is stable. Continue with your current management plan and attend your nephrology follow-up on Mar 18, 2024.  INSTRUCTIONS:  Please follow up with your nephrologist on Mar 18, 2024, and attend your pain specialist appointment on Mar 24, 2024. Consider obtaining a home device for regular blood sugar monitoring.

## 2024-02-24 NOTE — Progress Notes (Signed)
 Patient ID: Anna [name], female    DOB: [birth year]  MRN: [id number]  CC: Follow-up (Follow-up. Med refill. Anna [name] tags on both side of neck / chest /Send Voltaren  to our pharmacy - other meds [facility] to [facility] [address])   Subjective: Anna [name] is a 88 y.o. female who presents for chronic ds management. Anna [name], her grand grand caregiver, is with her.  Her concerns today include:  History of atrial flutter on Eliquis  with history of cardioversion 2015, systolic CHF/stress-induced cardiomyopathy HTN, trivial AR, mild MR,OA knees, HL, prediabetes, CKD 3, ACD, lumbar radiculopathy, hx of vertebral fx receiving pain management through Anna [name] at [facility]    Discussed the use of AI scribe software for clinical note transcription with the patient, who gave verbal consent to proceed.  History of Present Illness   Anna [name] is an 88 year old female who presents for follow-up after gallbladder removal. Post-surgery, she experienced weight fluctuation, initially losing weight but regaining four pounds, currently weighing 155 pounds which was her wgh when I saw her 4 mths ago. She maintains a good diet, preparing her own meals and using frozen meals.  DM Results for orders placed or performed in visit on 02/24/24  POCT glucose (manual entry)   Collection Time: 02/24/24  4:23 PM  Result Value Ref Range   POC Glucose 81 70 - 99 mg/dl  POCT glycosylated hemoglobin (Hb A1C)   Collection Time: 02/24/24  4:25 PM  Result Value Ref Range   Hemoglobin A1C     HbA1c POC (<> result, manual entry)     HbA1c, POC (prediabetic range) 6.3 5.7 - 6.4 %   HbA1c, POC (controlled diabetic range)    She has diabetes and takes Farxiga . She lacks a home device for blood sugar monitoring.  Does well with eating habits.  HTN/CHF/Atrial Fib: her blood pressure is elevated at 147/65. She is on carvedilol  3.25 mg twice daily, Entresto  24/26 mg BID, Furosemide  20 mg PRN and spironolactone  12.5 mg  daily.  Checks her blood pressure a few times a week.  Reports readings of 130s/57-60.  She has taken her morning medications but not the afternoon dose. She limits salt intake, occasionally consuming salted popcorn. No chest pain, shortness of breath, leg swelling, palpitations, syncope, or bleeding on Eliquis .  She fell four days ago while transferring from her WC to her bed due to her leg giving out, with no injuries sustained. She uses a hospital bed with a foam mattress topper.  The topper is thick and has increased the height of her hosp bed. Anna [name] is working on trying to fine a less thick foam topper.  She receives assistance from a home health aide daily except Sunday for four hours. She primarily uses a wheelchair at home.  We recently completed form for renewal of CAP program.   CKD 3: Her GFRs have been in the 40s with last reading of 43.  Has  nephrology follow-up on May 22.   She continues to see a pain specialist for her knees, with bone density and imaging scheduled for May 28.   Has some skin tags around the neck that are bothersome to her and 1 on the left shoulder.  She would like to have these removed.  HM:  She completed the shingles vaccine series, with the second dose on December 09, 2022 at her pharmacy.      Patient Active Problem List   Diagnosis Date Noted   Hypertension associated  with type 2 diabetes mellitus (HCC) 02/24/2024   Stage 3b chronic kidney disease (HCC) 02/24/2024   Acute cholecystitis due to biliary calculus 12/13/2023   Ovarian mass, left 12/13/2023   Acute cholecystitis 12/13/2023   CKD (chronic kidney disease), stage II 12/13/2023   Chronic diastolic CHF preserved EF 55 to 60% (congestive heart failure) (HCC) 12/13/2023   Chronic pain syndrome 12/13/2023   Takotsubo cardiomyopathy 07/09/2023   Elevated troponin    Acute heart failure (HCC)    NSTEMI (non-ST elevated myocardial infarction) (HCC) 08/17/2022   Mitral stenosis 05/07/2021   Recurrent  falls 04/24/2021   History of vertebral fracture 04/24/2021   Lumbar radiculopathy 04/24/2021   Anemia due to stage 3a chronic kidney disease (HCC) 03/07/2020   Stage 3a chronic kidney disease (HCC) 03/07/2020   Mixed incontinence urge and stress 03/09/2018   Prediabetes 03/09/2018   OA (osteoarthritis) of knee 03/09/2018   Sinus bradycardia 12/18/2016   Estrogen deficiency 07/03/2016   Mitral regurgitation 03/01/2014   Chronic atrial flutter (HCC) 03/01/2014   Permanent atrial fibrillation (HCC) 12/24/2013   Essential hypertension, benign 12/24/2013   Heart murmur 12/24/2013   Hyperlipemia 12/24/2013     Current Outpatient Medications on File Prior to Visit  Medication Sig Dispense Refill   Cholecalciferol (VITAMIN D3) 10 MCG (400 UNIT) tablet Take 2 tablets (800 Units total) by mouth daily. 120 tablet 2   cyclobenzaprine  (FLEXERIL ) 10 MG tablet Take 0.5 tablets (5 mg total) by mouth 2 (two) times daily as needed for muscle spasms. 20 tablet 0   docusate sodium  (COLACE) 100 MG capsule Take 100 mg by mouth daily.      ELIQUIS  5 MG TABS tablet TAKE 1 TABLET BY MOUTH TWICE A DAY 60 tablet 11   ferrous sulfate  325 (65 FE) MG tablet Take 1 tablet (325 mg total) by mouth every other day. 90 tablet 1   furosemide  (LASIX ) 20 MG tablet Take 1 tablet (20 mg total) by mouth as needed. (Patient taking differently: Take 20 mg by mouth as needed for edema.) 90 tablet 3   HYDROcodone -acetaminophen  (NORCO) 7.5-325 MG tablet Take 1 tablet by mouth 4 (four) times daily as needed.     Misc. Devices Paramedic. Devices MISC Rollator walker 1 Device 0   Misc. Devices MISC Hospital bed 1 Device 0   naloxone [facility]) nasal spray 4 mg/0.1 mL Place 1 spray into the nose once.     Omega-3 Fatty Acids (FISH OIL CONCENTRATE PO) Take 1 capsule by mouth daily.      ondansetron  (ZOFRAN ) 4 MG tablet Take 1 tablet (4 mg total) by mouth every 6 (six) hours as needed for nausea. 20  tablet 0   rosuvastatin  (CRESTOR ) 10 MG tablet TAKE 1 TABLET BY MOUTH EVERY MONDAY, WEDNESDAY, FRIDAY, AND SATURDAY 12 tablet 1   sacubitril -valsartan  (ENTRESTO ) 24-26 MG TAKE 1 TABLET BY MOUTH TWICE A DAY 180 tablet 2   sodium zirconium cyclosilicate  (LOKELMA ) 5 g packet Take 5 g by mouth daily. 30 each 2   spironolactone  (ALDACTONE ) 25 MG tablet TAKE 1/2 TABLET BY MOUTH EVERY DAY 45 tablet 3   White Petrolatum -Mineral Oil (SYSTANE NIGHTTIME) OINT Place 1 drop into the left eye 4 (four) times daily.     No current facility-administered medications on file prior to visit.    No Known Allergies  Social History   Socioeconomic History   Marital status: Divorced    Spouse name: Not on file  Number of children: 3   Years of education: Not on file   Highest education level: Not on file  Occupational History   Occupation: retired  Tobacco Use   Smoking status: Never   Smokeless tobacco: Never  Vaping Use   Vaping status: Never Used  Substance and Sexual Activity   Alcohol use: No   Drug use: No   Sexual activity: Not on file  Other Topics Concern   Not on file  Social History Narrative   Not on file   Social Drivers of Health   Financial Resource Strain: Low Risk  (02/24/2024)   Overall Financial Resource Strain (CARDIA)    Difficulty of Paying Living Expenses: Not hard at all  Food Insecurity: No Food Insecurity (02/24/2024)   Hunger Vital Sign    Worried About Running Out of Food in the Last Year: Never true    Ran Out of Food in the Last Year: Never true  Transportation Needs: No Transportation Needs (02/24/2024)   PRAPARE - Administrator, Civil Service (Medical): No    Lack of Transportation (Non-Medical): No  Physical Activity: Insufficiently Active (02/24/2024)   Exercise Vital Sign    Days of Exercise per Week: 6 days    Minutes of Exercise per Session: 20 min  Stress: No Stress Concern Present (02/24/2024)   [employer] of Occupational Health -  Occupational Stress Questionnaire    Feeling of Stress : Not at all  Social Connections: Moderately Isolated (12/16/2023)   Social Connection and Isolation Panel [NHANES]    Frequency of Communication with Friends and Family: More than three times a week    Frequency of Social Gatherings with Friends and Family: More than three times a week    Attends Religious Services: More than 4 times per year    Active Member of [employer] or Organizations: No    Attends Banker Meetings: Never    Marital Status: Widowed  Intimate Partner Violence: Not At Risk (02/24/2024)   Humiliation, Afraid, Rape, and Kick questionnaire    Fear of Current or Ex-Partner: No    Emotionally Abused: No    Physically Abused: No    Sexually Abused: No    Family History  Problem Relation Age of Onset   Alzheimer's disease Mother     Past Surgical History:  Procedure Laterality Date   CARDIOVERSION N/A 03/24/2014   Procedure: CARDIOVERSION;  Surgeon: Jacqueline Matsu, MD;  Location: [facility] ENDOSCOPY;  Service: Cardiovascular;  Laterality: N/A;   CHOLECYSTECTOMY N/A 12/15/2023   Procedure: LAPAROSCOPIC CHOLECYSTECTOMY;  Surgeon: Oza Blumenthal, MD;  Location: [facility] OR;  Service: General;  Laterality: N/A;   ESOPHAGOGASTRODUODENOSCOPY (EGD) WITH PROPOFOL  N/A 12/14/2023   Procedure: ESOPHAGOGASTRODUODENOSCOPY (EGD) WITH PROPOFOL ;  Surgeon: Alvis Jourdain, MD;  Location: [facility] ENDOSCOPY;  Service: Gastroenterology;  Laterality: N/A;   EUS N/A 12/14/2023   Procedure: UPPER ENDOSCOPIC ULTRASOUND (EUS) LINEAR;  Surgeon: Alvis Jourdain, MD;  Location: [facility] ENDOSCOPY;  Service: Gastroenterology;  Laterality: N/A;   LEFT HEART CATH AND CORONARY ANGIOGRAPHY N/A 08/20/2022   Procedure: LEFT HEART CATH AND CORONARY ANGIOGRAPHY;  Surgeon: Lucendia Rusk, MD;  Location: [facility] INVASIVE CV LAB;  Service: Cardiovascular;  Laterality: N/A;    ROS: Review of Systems Negative except as stated above  PHYSICAL EXAM: BP (!) 147/56    Pulse (!) 42   Temp 97.9 F (36.6 C) (Oral)   Ht 5\' 4"  (1.626 m)   Wt 155 lb 12.8 oz (70.7 kg)  SpO2 99%   BMI 26.74 kg/m   Wt Readings from Last 3 Encounters:  02/24/24 155 lb 12.8 oz (70.7 kg)  01/12/24 152 lb (68.9 kg)  12/16/23 162 lb 11.2 oz (73.8 kg)    Physical Exam  General appearance -pleasant elderly Caucasian female sitting in wheelchair in NAD. Mental status -patient answers most questions appropriately.  Her granddaughter assists with history. Neck - supple, no significant adenopathy Chest - clear to auscultation, no wheezes, rales or rhonchi, symmetric air entry Heart -bradycardia.  Irregularly irregular. Musculoskeletal -knees: Joints are enlarged.  Discomfort with passive range of motion Extremities -no lower extremity edema      Latest Ref Rng & Units 01/23/2024    2:14 PM 01/12/2024    2:24 PM 12/25/2023    2:22 PM  CMP  Glucose 70 - 99 mg/dL 213  086  578   BUN 8 - 27 mg/dL 27  26  13    Creatinine 0.57 - 1.00 mg/dL 4.69  6.29  5.28   Sodium 134 - 144 mmol/L 133  135  138   Potassium 3.5 - 5.2 mmol/L 5.1  4.4  5.8   Chloride 96 - 106 mmol/L 95  96  97   CO2 20 - 29 mmol/L 23  24  26    Calcium  8.7 - 10.3 mg/dL 9.6  9.7  9.7   Total Protein 6.0 - 8.5 g/dL   6.4   Total Bilirubin 0.0 - 1.2 mg/dL   0.7   Alkaline Phos 44 - 121 IU/L   145   AST 0 - 40 IU/L   22   ALT 0 - 32 IU/L   10    Lipid Panel     Component Value Date/Time   CHOL 100 10/02/2023 1639   TRIG 71 10/02/2023 1639   HDL 44 10/02/2023 1639   CHOLHDL 2.3 10/02/2023 1639   CHOLHDL 2.1 08/18/2022 0620   VLDL 12 08/18/2022 0620   LDLCALC 41 10/02/2023 1639    CBC    Component Value Date/Time   WBC 5.0 12/25/2023 1422   WBC 7.6 12/16/2023 0355   RBC 3.49 (L) 12/25/2023 1422   RBC 4.11 12/16/2023 0355   HGB 10.0 (L) 12/25/2023 1422   HCT 31.6 (L) 12/25/2023 1422   PLT 310 12/25/2023 1422   MCV 91 12/25/2023 1422   MCH 28.7 12/25/2023 1422   MCH 29.0 12/16/2023 0355   MCHC 31.6  12/25/2023 1422   MCHC 32.6 12/16/2023 0355   RDW 12.8 12/25/2023 1422   LYMPHSABS 1.4 12/25/2023 1422   MONOABS 0.3 12/16/2023 0355   EOSABS 0.2 12/25/2023 1422   BASOSABS 0.1 12/25/2023 1422    ASSESSMENT AND PLAN: 1. Diabetes mellitus treated with oral medication (HCC) (Primary) Controlled.  Continue Farxiga  and healthy eating habits. - dapagliflozin  propanediol (FARXIGA ) 10 MG TABS tablet; Take 1 tablet (10 mg total) by mouth daily.  Dispense: 90 tablet; Refill: 2 - POCT glycosylated hemoglobin (Hb A1C) - POCT glucose (manual entry)  2. Chronic systolic congestive heart failure (HCC) Stable and compensated.  Continue carvedilol , Farxiga , Entresto , spironolactone  and furosemide  as needed - carvedilol  (COREG ) 3.125 MG tablet; Take 1 tablet (3.125 mg total) by mouth 2 (two) times daily with a meal.  Dispense: 180 tablet; Refill: 1 - dapagliflozin  propanediol (FARXIGA ) 10 MG TABS tablet; Take 1 tablet (10 mg total) by mouth daily.  Dispense: 90 tablet; Refill: 2  3. Hypertension associated with type 2 diabetes mellitus (HCC) Blood pressure not at goal but  reported home blood pressure readings are better.  Continue carvedilol  3.25 mg twice a day, Entresto  twice a day, spironolactone  25 mg half a tablet daily  4. Stage 3b chronic kidney disease (HCC) Stable.  Not on oral NSAIDs.  5. Permanent atrial fibrillation (HCC) Continue Eliquis .  No bruising or bleeding on the medication  6. History of recent fall This is concerning given that she is on Eliquis .  Also at risk for fracture given her age.  Granddaughter will look into getting a lower foam Topper for her bed to decrease the height of the bed from the floor.  Discussed home physical therapy to assess for safety issues and teach transfer techniques.  Patient declined  7. Primary osteoarthritis of both knees - diclofenac  Sodium (VOLTAREN ) 1 % GEL; Apply 2 grams to knees 4 times a day as needed.  Dispense: 400 g; Refill: 2  8. Skin  tag - Ambulatory referral to Dermatology   Patient was given the opportunity to ask questions.  Patient verbalized understanding of the plan and was able to repeat key elements of the plan.   This documentation was completed using Paediatric nurse.  Any transcriptional errors are unintentional.  Orders Placed This Encounter  Procedures   Ambulatory referral to Dermatology   POCT glycosylated hemoglobin (Hb A1C)   POCT glucose (manual entry)     Requested Prescriptions   Signed Prescriptions Disp Refills   carvedilol  (COREG ) 3.125 MG tablet 180 tablet 1    Sig: Take 1 tablet (3.125 mg total) by mouth 2 (two) times daily with a meal.   dapagliflozin  propanediol (FARXIGA ) 10 MG TABS tablet 90 tablet 2    Sig: Take 1 tablet (10 mg total) by mouth daily.   diclofenac  Sodium (VOLTAREN ) 1 % GEL 400 g 2    Sig: Apply 2 grams to knees 4 times a day as needed.    Return in about 4 months (around 06/25/2024) for Medicare Wellness Visit in 2-4   wks with CMA/LPN.  Concetta Dee, MD, FACP

## 2024-03-01 ENCOUNTER — Telehealth: Payer: Self-pay | Admitting: Internal Medicine

## 2024-03-01 NOTE — Telephone Encounter (Signed)
 Copied from CRM [id number]. Topic: Referral - Status  >> Mar 01, 2024  3:59 PM Felizardo Hotter wrote: Reason for CRM: Pt's granddaughter Illona Malone called on behalf of pt stated referral # [id number] [facility] are OON with pt's insurance. Please redirect referral and please call Miller,[name] [phone].

## 2024-03-01 NOTE — Telephone Encounter (Signed)
 Anna [name]: Please see patient's message.  She wants a dermatology referral to be sent to [facility].

## 2024-03-02 NOTE — Telephone Encounter (Signed)
 Noted! Thank you

## 2024-03-03 ENCOUNTER — Other Ambulatory Visit: Payer: Self-pay | Admitting: Internal Medicine

## 2024-03-03 DIAGNOSIS — I5020 Unspecified systolic (congestive) heart failure: Secondary | ICD-10-CM

## 2024-03-03 NOTE — Telephone Encounter (Signed)
 Noted.  Thank you Nicholes Barks.

## 2024-03-23 ENCOUNTER — Other Ambulatory Visit: Payer: Self-pay

## 2024-03-24 ENCOUNTER — Other Ambulatory Visit: Payer: Self-pay

## 2024-03-24 ENCOUNTER — Ambulatory Visit (HOSPITAL_BASED_OUTPATIENT_CLINIC_OR_DEPARTMENT_OTHER)
Admission: RE | Admit: 2024-03-24 | Discharge: 2024-03-24 | Disposition: A | Discharge status: Home / Self Care | Source: Ambulatory Visit | Attending: Nurse Practitioner | Admitting: Nurse Practitioner

## 2024-03-24 DIAGNOSIS — Z780 Asymptomatic menopausal state: Secondary | ICD-10-CM | POA: Diagnosis present

## 2024-03-24 DIAGNOSIS — R3129 Other microscopic hematuria: Secondary | ICD-10-CM

## 2024-04-02 ENCOUNTER — Ambulatory Visit: Attending: Internal Medicine

## 2024-04-02 DIAGNOSIS — Z0000 Encounter for general adult medical examination without abnormal findings: Secondary | ICD-10-CM | POA: Diagnosis not present

## 2024-04-02 NOTE — Progress Notes (Signed)
 Subjective:   Anna [name] is a 88 y.o. female who presents for Medicare Annual (Subsequent) preventive examination.  Visit Complete: Virtual I connected with  Lesia Raring and Grand-daughter Illona Malone on 04/02/24 by a audio enabled telemedicine application and verified that I am speaking with the correct person using two identifiers.  Patient Location: Home  Provider Location: Office/Clinic  I discussed the limitations of evaluation and management by telemedicine. The patient expressed understanding and agreed to proceed.  Vital Signs: Because this visit was a virtual/telehealth visit, some criteria may be missing or patient reported. Any vitals not documented were not able to be obtained and vitals that have been documented are patient reported.  Patient Medicare AWV questionnaire was not  completed   Cardiac Risk Factors include: advanced age (>40men, >46 women);hypertension     Objective:    There were no vitals filed for this visit. There is no height or weight on file to calculate BMI.     04/02/2024    2:46 PM 12/14/2023   11:23 AM 02/21/2023    3:06 PM 08/17/2022   10:09 AM 07/15/2021    2:19 PM 04/11/2021    2:07 PM 03/15/2021   12:44 AM  Advanced Directives  Does Patient Have a Medical Advance Directive? Yes No Yes No Yes Yes No  Type of Estate agent of [place];Living will  Living will;Healthcare Power of Attorney  Living will    Does patient want to make changes to medical advance directive?     Yes (ED - send information to MyChart) No - Patient declined   Would patient like information on creating a medical advance directive?    No - Patient declined   No - Patient declined    Current Medications (verified) Outpatient Encounter Medications as of 04/02/2024  Medication Sig   carvedilol  (COREG ) 3.125 MG tablet Take 1 tablet (3.125 mg total) by mouth 2 (two) times daily with a meal.   Cholecalciferol (VITAMIN D3) 10 MCG (400 UNIT) tablet  Take 2 tablets (800 Units total) by mouth daily.   cyclobenzaprine  (FLEXERIL ) 10 MG tablet Take 0.5 tablets (5 mg total) by mouth 2 (two) times daily as needed for muscle spasms.   dapagliflozin  propanediol (FARXIGA ) 10 MG TABS tablet Take 1 tablet (10 mg total) by mouth daily.   diclofenac  Sodium (VOLTAREN ) 1 % GEL Apply 2 grams to knees 4 times a day as needed.   docusate sodium  (COLACE) 100 MG capsule Take 100 mg by mouth daily.    ELIQUIS  5 MG TABS tablet TAKE 1 TABLET BY MOUTH TWICE A DAY   ferrous sulfate  325 (65 FE) MG tablet Take 1 tablet (325 mg total) by mouth every other day.   furosemide  (LASIX ) 20 MG tablet Take 1 tablet (20 mg total) by mouth as needed. (Patient taking differently: Take 20 mg by mouth as needed for edema.)   HYDROcodone -acetaminophen  (NORCO) 7.5-325 MG tablet Take 1 tablet by mouth 4 (four) times daily as needed.   Misc. Devices Event organiser. Devices MISC Rollator walker   Misc. Devices MISC Hospital bed   naloxone [facility]) nasal spray 4 mg/0.1 mL Place 1 spray into the nose once.   Omega-3 Fatty Acids (FISH OIL CONCENTRATE PO) Take 1 capsule by mouth daily.    ondansetron  (ZOFRAN ) 4 MG tablet Take 1 tablet (4 mg total) by mouth every 6 (six) hours as needed for nausea.   rosuvastatin  (CRESTOR ) 10 MG tablet TAKE 1  TABLET BY MOUTH EVERY MONDAY, WEDNESDAY, FRIDAY, AND SATURDAYSTRENGTH: 10 MG   sacubitril -valsartan  (ENTRESTO ) 24-26 MG TAKE 1 TABLET BY MOUTH TWICE A DAY   sodium zirconium cyclosilicate  (LOKELMA ) 5 g packet Take 5 g by mouth daily.   spironolactone  (ALDACTONE ) 25 MG tablet TAKE 1/2 TABLET BY MOUTH EVERY DAY   White Petrolatum -Mineral Oil (SYSTANE NIGHTTIME) OINT Place 1 drop into the left eye 4 (four) times daily.   No facility-administered encounter medications on file as of 04/02/2024.    Allergies (verified) Patient has no known allergies.   History: Past Medical History:  Diagnosis Date   Dysrhythmia    Hyperlipidemia     Hypertension    Mild CAD    Mitral regurgitation    trivial   Mitral stenosis    mild   Osteoarthritis    Overweight(278.02)    Permanent atrial fibrillation (HCC)    Takotsubo cardiomyopathy    Past Surgical History:  Procedure Laterality Date   CARDIOVERSION N/A 03/24/2014   Procedure: CARDIOVERSION;  Surgeon: Jacqueline Matsu, MD;  Location: [facility] ENDOSCOPY;  Service: Cardiovascular;  Laterality: N/A;   CHOLECYSTECTOMY N/A 12/15/2023   Procedure: LAPAROSCOPIC CHOLECYSTECTOMY;  Surgeon: Oza Blumenthal, MD;  Location: [facility] OR;  Service: General;  Laterality: N/A;   ESOPHAGOGASTRODUODENOSCOPY (EGD) WITH PROPOFOL  N/A 12/14/2023   Procedure: ESOPHAGOGASTRODUODENOSCOPY (EGD) WITH PROPOFOL ;  Surgeon: Alvis Jourdain, MD;  Location: [facility] ENDOSCOPY;  Service: Gastroenterology;  Laterality: N/A;   EUS N/A 12/14/2023   Procedure: UPPER ENDOSCOPIC ULTRASOUND (EUS) LINEAR;  Surgeon: Alvis Jourdain, MD;  Location: [facility] ENDOSCOPY;  Service: Gastroenterology;  Laterality: N/A;   LEFT HEART CATH AND CORONARY ANGIOGRAPHY N/A 08/20/2022   Procedure: LEFT HEART CATH AND CORONARY ANGIOGRAPHY;  Surgeon: Lucendia Rusk, MD;  Location: [facility] INVASIVE CV LAB;  Service: Cardiovascular;  Laterality: N/A;   Family History  Problem Relation Age of Onset   Alzheimer's disease Mother    Social History   Socioeconomic History   Marital status: Divorced    Spouse name: Not on file   Number of children: 3   Years of education: Not on file   Highest education level: Not on file  Occupational History   Occupation: retired  Tobacco Use   Smoking status: Never   Smokeless tobacco: Never  Vaping Use   Vaping status: Never Used  Substance and Sexual Activity   Alcohol use: No   Drug use: No   Sexual activity: Not on file  Other Topics Concern   Not on file  Social History Narrative   Not on file   Social Drivers of Health   Financial Resource Strain: Low Risk  (02/24/2024)   Overall Financial Resource Strain  (CARDIA)    Difficulty of Paying Living Expenses: Not hard at all  Food Insecurity: No Food Insecurity (02/24/2024)   Hunger Vital Sign    Worried About Running Out of Food in the Last Year: Never true    Ran Out of Food in the Last Year: Never true  Transportation Needs: No Transportation Needs (02/24/2024)   PRAPARE - Administrator, Civil Service (Medical): No    Lack of Transportation (Non-Medical): No  Physical Activity: Insufficiently Active (02/24/2024)   Exercise Vital Sign    Days of Exercise per Week: 6 days    Minutes of Exercise per Session: 20 min  Stress: No Stress Concern Present (02/24/2024)   [employer] of Occupational Health - Occupational Stress Questionnaire    Feeling of Stress : Not at  all  Social Connections: Moderately Isolated (12/16/2023)   Social Connection and Isolation Panel [NHANES]    Frequency of Communication with Friends and Family: More than three times a week    Frequency of Social Gatherings with Friends and Family: More than three times a week    Attends Religious Services: More than 4 times per year    Active Member of [employer] or Organizations: No    Attends Banker Meetings: Never    Marital Status: Widowed    Tobacco Counseling Counseling given: Not Answered   Clinical Intake:  Pre-visit preparation completed: Yes  Pain : No/denies pain     Nutritional Risks: None Diabetes: No  How often do you need to have someone help you when you read instructions, pamphlets, or other written materials from your doctor or pharmacy?: 3 - Sometimes  Interpreter Needed?: No      Activities of Daily Living    04/02/2024    2:44 PM 12/16/2023    5:23 PM  In your present state of health, do you have any difficulty performing the following activities:  Hearing? 1   Vision? 1   Difficulty concentrating or making decisions? 0   Walking or climbing stairs? 1   Dressing or bathing? 1   Doing errands, shopping? 1 0   Preparing Food and eating ? N   Using the Toilet? Y   In the past six months, have you accidently leaked urine? Y   Do you have problems with loss of bowel control? N   Managing your Medications? Y   Managing your Finances? N   Housekeeping or managing your Housekeeping? Y     Patient Care Team: Lawrance Presume, MD as PCP - General (Internal Medicine) Jacqueline Matsu, MD as PCP - Cardiology (Cardiology)  Indicate any recent Medical Services you may have received from other than [facility] providers in the past year (date may be approximate).     Assessment:   This is a routine wellness examination for Anna [name].  Hearing/Vision screen No results found.   Goals Addressed   None   Depression Screen    04/02/2024    2:48 PM 02/24/2024    3:15 PM 12/25/2023    1:53 PM 06/02/2023    3:32 PM 02/21/2023    3:06 PM 01/31/2023    2:13 PM 01/31/2023    2:12 PM  PHQ 2/9 Scores  PHQ - 2 Score 0 2 0 0 0 0 0  PHQ- 9 Score  5 0 0  1     Fall Risk    04/02/2024    2:46 PM 02/24/2024    3:15 PM 12/25/2023    1:53 PM 02/21/2023    3:06 PM 09/27/2022    2:09 PM  Fall Risk   Falls in the past year? 1 1 1  0 0  Number falls in past yr: 1 0 0 0 0  Injury with Fall? 1 0 0 0 0  Risk for fall due to : No Fall Risks History of fall(s) History of fall(s) No Fall Risks No Fall Risks  Follow up Falls evaluation completed Falls evaluation completed Falls evaluation completed      MEDICARE RISK AT HOME: Medicare Risk at Home Any stairs in or around the home?: No If so, are there any without handrails?: No Home free of loose throw rugs in walkways, pet beds, electrical cords, etc?: Yes Adequate lighting in your home to reduce risk of falls?: Yes Life alert?: Yes (  not working at this time) Use of a cane, walker or w/c?: Yes Grab bars in the bathroom?: Yes Shower chair or bench in shower?: Yes Elevated toilet seat or a handicapped toilet?: Yes  TIMED UP AND GO:  Was the test performed?  No     Cognitive Function:    02/21/2023    3:08 PM  MMSE - Mini Mental State Exam  Not completed: Unable to complete        04/02/2024    2:48 PM 07/15/2021    2:10 PM  6CIT Screen  What Year? 0 points 0 points  What month? 0 points 0 points  What time? 0 points 0 points  Count back from 20 2 points 0 points  Months in reverse 2 points 2 points  Repeat phrase 2 points 0 points  Total Score 6 points 2 points    Immunizations Immunization History  Administered Date(s) Administered   Fluad Trivalent(High Dose 65+) 10/02/2023   Influenza, High Dose Seasonal PF 07/10/2018, 07/19/2019, 08/18/2022   Influenza,inj,Quad PF,6+ Mos 06/25/2016, 09/16/2017, 07/07/2020, 07/20/2021   Janssen (J&J) SARS-COV-2 Vaccination 02/07/2020   PFIZER(Purple Top)SARS-COV-2 Vaccination 03/15/2021   Pneumococcal Conjugate-13 09/16/2017   Pneumococcal Polysaccharide-23 03/28/2022   Tdap 03/17/2017   Zoster Recombinant(Shingrix) 12/09/2022    TDAP status: Up to date  Flu Vaccine status: Up to date  Pneumococcal vaccine status: Up to date  Covid-19 vaccine status: Completed vaccines  Qualifies for Shingles Vaccine? Yes   Zostavax completed Yes   Shingrix Completed?: Yes: will submit paperwork for records from [employer]  Screening Tests Health Maintenance  Topic Date Due   FOOT EXAM  Never done   OPHTHALMOLOGY EXAM  Never done   Zoster Vaccines- Shingrix (2 of 2) 02/03/2023   COVID-19 Vaccine (3 - 2024-25 season) 06/29/2023   INFLUENZA VACCINE  05/28/2024   HEMOGLOBIN A1C  08/25/2024   Medicare Annual Wellness (AWV)  04/02/2025   DTaP/Tdap/Td (2 - Td or Tdap) 03/18/2027   Pneumonia Vaccine 41+ Years old  Completed   DEXA SCAN  Completed   HPV VACCINES  Aged Out   Meningococcal B Vaccine  Aged Out    Health Maintenance  Health Maintenance Due  Topic Date Due   FOOT EXAM  Never done   OPHTHALMOLOGY EXAM  Never done   Zoster Vaccines- Shingrix (2 of 2) 02/03/2023   COVID-19 Vaccine (3 -  2024-25 season) 06/29/2023    Colorectal cancer screening: No longer required.   Mammogram status: No longer required due to age.  Bone Density status: Completed 03/24/2024. Results reflect: Bone density results: OSTEOPOROSIS. Repeat every 2 years.  Lung Cancer Screening: (Low Dose CT Chest recommended if Age 75-80 years, 20 pack-year currently smoking OR have quit w/in 15years.) does not qualify.   Additional Screening:  Hepatitis C Screening: does not qualify  Vision Screening: Recommended annual ophthalmology exams for early detection of glaucoma and other disorders of the eye. Is the patient up to date with their annual eye exam?  Yes  Who is the provider or what is the name of the office in which the patient attends annual eye exams? [facility] If pt is not established with a provider, would they like to be referred to a provider to establish care? No .   Dental Screening: Recommended annual dental exams for proper oral hygiene  Diabetic Foot Exam: Diabetic Foot Exam: Overdue, Pt has been advised about the importance in completing this exam. Pt is scheduled for diabetic foot exam on next  office visit.  Community Resource Referral / Chronic Care Management: CRR required this visit?  No   CCM required this visit?  No     Plan:     I have personally reviewed and noted the following in the patient's chart:   Medical and social history Use of alcohol, tobacco or illicit drugs  Current medications and supplements including opioid prescriptions. Patient is not currently taking opioid prescriptions. Functional ability and status Nutritional status Physical activity Advanced directives List of other physicians Hospitalizations, surgeries, and ER visits in previous 12 months Vitals Screenings to include cognitive, depression, and falls Referrals and appointments  In addition, I have reviewed and discussed with patient certain preventive protocols, quality metrics, and  best practice recommendations. A written personalized care plan for preventive services as well as general preventive health recommendations were provided to patient.     Kirstina Leinweber V Saifullah Jolley, CMA   04/02/2024   After Visit Summary: (Mail) Due to this being a telephonic visit, the after visit summary with patients personalized plan was offered to patient via mail   Nurse Notes: Start time: 2:40 pm, End time: 2:52 pm

## 2024-04-02 NOTE — Patient Instructions (Signed)

## 2024-04-19 ENCOUNTER — Other Ambulatory Visit: Payer: Self-pay

## 2024-04-26 ENCOUNTER — Other Ambulatory Visit: Payer: Self-pay

## 2024-04-26 MED ORDER — FUROSEMIDE 20 MG PO TABS: 20.0000 mg | ORAL_TABLET | Freq: Every day | ORAL | 2 refills | Status: AC | PRN

## 2024-05-19 ENCOUNTER — Other Ambulatory Visit: Payer: Self-pay

## 2024-06-21 ENCOUNTER — Other Ambulatory Visit (HOSPITAL_COMMUNITY): Payer: Self-pay

## 2024-06-21 ENCOUNTER — Other Ambulatory Visit: Payer: Self-pay

## 2024-06-22 ENCOUNTER — Telehealth: Payer: Self-pay | Admitting: Internal Medicine

## 2024-06-22 NOTE — Telephone Encounter (Signed)
 Called patient to confirm upcoming appointment 06/24/2024. Patient appointment has been successfully confirmed

## 2024-06-24 ENCOUNTER — Encounter: Payer: Self-pay | Admitting: Internal Medicine

## 2024-06-24 ENCOUNTER — Ambulatory Visit: Attending: Internal Medicine | Admitting: Internal Medicine

## 2024-06-24 ENCOUNTER — Other Ambulatory Visit: Payer: Self-pay

## 2024-06-24 VITALS — BP 159/68 | HR 51 | Temp 97.6°F | Ht 64.0 in | Wt 148.0 lb

## 2024-06-24 DIAGNOSIS — I5022 Chronic systolic (congestive) heart failure: Secondary | ICD-10-CM | POA: Diagnosis not present

## 2024-06-24 DIAGNOSIS — I130 Hypertensive heart and chronic kidney disease with heart failure and stage 1 through stage 4 chronic kidney disease, or unspecified chronic kidney disease: Secondary | ICD-10-CM

## 2024-06-24 DIAGNOSIS — Z7984 Long term (current) use of oral hypoglycemic drugs: Secondary | ICD-10-CM

## 2024-06-24 DIAGNOSIS — M810 Age-related osteoporosis without current pathological fracture: Secondary | ICD-10-CM

## 2024-06-24 DIAGNOSIS — Z23 Encounter for immunization: Secondary | ICD-10-CM

## 2024-06-24 DIAGNOSIS — R634 Abnormal weight loss: Secondary | ICD-10-CM

## 2024-06-24 DIAGNOSIS — E119 Type 2 diabetes mellitus without complications: Secondary | ICD-10-CM

## 2024-06-24 DIAGNOSIS — N1832 Chronic kidney disease, stage 3b: Secondary | ICD-10-CM

## 2024-06-24 DIAGNOSIS — E1159 Type 2 diabetes mellitus with other circulatory complications: Secondary | ICD-10-CM

## 2024-06-24 DIAGNOSIS — I152 Hypertension secondary to endocrine disorders: Secondary | ICD-10-CM

## 2024-06-24 DIAGNOSIS — I4821 Permanent atrial fibrillation: Secondary | ICD-10-CM

## 2024-06-24 LAB — GLUCOSE, POCT (MANUAL RESULT ENTRY): POC Glucose: 133 mg/dL — AB (ref 70–99)

## 2024-06-24 LAB — POCT GLYCOSYLATED HEMOGLOBIN (HGB A1C): HbA1c, POC (controlled diabetic range): 5.8 % (ref 0.0–7.0)

## 2024-06-24 MED ORDER — AMLODIPINE BESYLATE 2.5 MG PO TABS: 2.5000 mg | ORAL_TABLET | Freq: Every day | ORAL | 1 refills | Status: DC

## 2024-06-24 NOTE — Progress Notes (Signed)
 Patient ID: Anna [name], female    DOB: [birth year]  MRN: [id number]  CC: Hypertension (HTN f/u. /No questions / concerns/Flu vax administered on 06/24/24 - C.A)   Subjective: Anna [name] is a 88 y.o. female who presents for chronic ds management. [name] and [name]'s 3 yr old grandson are with her.  Her concerns today include:  History of atrial flutter on Eliquis  with history of cardioversion 2015, systolic CHF/stress-induced cardiomyopathy HTN, trivial AR, mild MR,OA knees, HL, prediabetes, CKD 3, ACD, lumbar radiculopathy, hx of vertebral fx receiving pain management through Dr. Norman at [facility].    Discussed the use of AI scribe software for clinical note transcription with the patient, who gave verbal consent to proceed.  History of Present Illness Anna [name] is an 88 year old female with diabetes who presents for follow-up on her diabetes management.  DM: She is currently taking Farxiga  daily for her diabetes but does not monitor her blood glucose levels at home due to her preference to avoid finger pricks. Her dietary habits are inconsistent, with variable intake, though she maintains a regular breakfast routine which is usually a large bowl of cereal with fruits.  She then does not eat again until supper.  However she does have a snack between brunch and supper and before going to bed. She has lost 7 pounds over the past four months. No changes in appetite or depressive symptoms. No fever, abdominal pain, chronic cough, or hematochezia. She experienced nocturnal xerostomia until a couple of weeks ago, which has resolved.  HTN/CHF/A.fib: she is on carvedilol , Entresto , and spironolactone .  She takes furosemide  as needed.  She adheres to her morning medication regimen but has not taken her afternoon dose as yet for the day. She does not monitor her blood pressure at home. No symptoms of dyspnea, angina, or peripheral edema are present. Regarding atrial fibrillation, she  is on Eliquis  and experiences easy bruising without bleeding.  She has chronic kidney disease, stage 3, and is scheduled for a nephrology consultation on September 3rd.  GFR has been in the 40s.    OA of the knees: She has not experienced any recent falls and uses a wheelchair for mobility. She continues to receive pain management for knee arthritis from [facility].  Osteoporosis: She had a bone density study performed in May that was ordered by her pain specialist. This showed osteoporosis at the LT forearm with T score of -2.8. [facility] tells me they were not notified of the results. She takes vitamin D3 and calcium  supplements daily    Patient Active Problem List   Diagnosis Date Noted   Hypertension associated with type 2 diabetes mellitus (HCC) 02/24/2024   Stage 3b chronic kidney disease (HCC) 02/24/2024   Acute cholecystitis due to biliary calculus 12/13/2023   Ovarian mass, left 12/13/2023   Acute cholecystitis 12/13/2023   CKD (chronic kidney disease), stage II 12/13/2023   Chronic diastolic CHF preserved EF 55 to 60% (congestive heart failure) (HCC) 12/13/2023   Chronic pain syndrome 12/13/2023   Takotsubo cardiomyopathy 07/09/2023   Elevated troponin    Acute heart failure (HCC)    NSTEMI (non-ST elevated myocardial infarction) (HCC) 08/17/2022   Mitral stenosis 05/07/2021   Recurrent falls 04/24/2021   History of vertebral fracture 04/24/2021   Lumbar radiculopathy 04/24/2021   Anemia due to stage 3a chronic kidney disease (HCC) 03/07/2020   Stage 3a chronic kidney disease (HCC) 03/07/2020   Mixed incontinence urge and stress  03/09/2018   Prediabetes 03/09/2018   OA (osteoarthritis) of knee 03/09/2018   Sinus bradycardia 12/18/2016   Estrogen deficiency 07/03/2016   Mitral regurgitation 03/01/2014   Chronic atrial flutter (HCC) 03/01/2014   Permanent atrial fibrillation (HCC) 12/24/2013   Essential hypertension, benign 12/24/2013   Heart murmur 12/24/2013    Hyperlipemia 12/24/2013     Current Outpatient Medications on File Prior to Visit  Medication Sig Dispense Refill   carvedilol  (COREG ) 3.125 MG tablet Take 1 tablet (3.125 mg total) by mouth 2 (two) times daily with a meal. 180 tablet 1   Cholecalciferol (VITAMIN D3) 10 MCG (400 UNIT) tablet Take 2 tablets (800 Units total) by mouth daily. 120 tablet 2   cyclobenzaprine  (FLEXERIL ) 10 MG tablet Take 0.5 tablets (5 mg total) by mouth 2 (two) times daily as needed for muscle spasms. 20 tablet 0   dapagliflozin  propanediol (FARXIGA ) 10 MG TABS tablet Take 1 tablet (10 mg total) by mouth daily. 90 tablet 2   diclofenac  Sodium (VOLTAREN ) 1 % GEL Apply 2 grams to knees 4 times a day as needed. 400 g 2   docusate sodium  (COLACE) 100 MG capsule Take 100 mg by mouth daily.      ELIQUIS  5 MG TABS tablet TAKE 1 TABLET BY MOUTH TWICE A DAY 60 tablet 11   ferrous sulfate  325 (65 FE) MG tablet Take 1 tablet (325 mg total) by mouth every other day. 90 tablet 1   furosemide  (LASIX ) 20 MG tablet Take 1 tablet (20 mg total) by mouth daily as needed. 90 tablet 2   HYDROcodone -acetaminophen  (NORCO) 7.5-325 MG tablet Take 1 tablet by mouth 4 (four) times daily as needed.     Misc. Devices Paramedic. Devices MISC Rollator walker 1 Device 0   Misc. Devices MISC Hospital bed 1 Device 0   naloxone [facility]) nasal spray 4 mg/0.1 mL Place 1 spray into the nose once.     Omega-3 Fatty Acids (FISH OIL CONCENTRATE PO) Take 1 capsule by mouth daily.      ondansetron  (ZOFRAN ) 4 MG tablet Take 1 tablet (4 mg total) by mouth every 6 (six) hours as needed for nausea. 20 tablet 0   rosuvastatin  (CRESTOR ) 10 MG tablet TAKE 1 TABLET BY MOUTH EVERY MONDAY, WEDNESDAY, FRIDAY, AND SATURDAYSTRENGTH: 10 MG 36 tablet 1   sacubitril -valsartan  (ENTRESTO ) 24-26 MG TAKE 1 TABLET BY MOUTH TWICE A DAY 180 tablet 2   sodium zirconium cyclosilicate  (LOKELMA ) 5 g packet Take 5 g by mouth daily. 30 each 2    spironolactone  (ALDACTONE ) 25 MG tablet TAKE 1/2 TABLET BY MOUTH EVERY DAY 45 tablet 3   White Petrolatum -Mineral Oil (SYSTANE NIGHTTIME) OINT Place 1 drop into the left eye 4 (four) times daily.     No current facility-administered medications on file prior to visit.    No Known Allergies  Social History   Socioeconomic History   Marital status: Divorced    Spouse name: Not on file   Number of children: 3   Years of education: Not on file   Highest education level: Not on file  Occupational History   Occupation: retired  Tobacco Use   Smoking status: Never   Smokeless tobacco: Never  Vaping Use   Vaping status: Never Used  Substance and Sexual Activity   Alcohol use: No   Drug use: No   Sexual activity: Not on file  Other Topics Concern   Not on file  Social  History Narrative   Not on file   Social Drivers of Health   Financial Resource Strain: Low Risk  (02/24/2024)   Overall Financial Resource Strain (CARDIA)    Difficulty of Paying Living Expenses: Not hard at all  Food Insecurity: No Food Insecurity (02/24/2024)   Hunger Vital Sign    Worried About Running Out of Food in the Last Year: Never true    Ran Out of Food in the Last Year: Never true  Transportation Needs: No Transportation Needs (02/24/2024)   PRAPARE - Administrator, Civil Service (Medical): No    Lack of Transportation (Non-Medical): No  Physical Activity: Insufficiently Active (02/24/2024)   Exercise Vital Sign    Days of Exercise per Week: 6 days    Minutes of Exercise per Session: 20 min  Stress: No Stress Concern Present (02/24/2024)   [employer] of Occupational Health - Occupational Stress Questionnaire    Feeling of Stress : Not at all  Social Connections: Moderately Isolated (12/16/2023)   Social Connection and Isolation Panel    Frequency of Communication with Friends and Family: More than three times a week    Frequency of Social Gatherings with Friends and Family: More  than three times a week    Attends Religious Services: More than 4 times per year    Active Member of [employer] or Organizations: No    Attends Banker Meetings: Never    Marital Status: Widowed  Intimate Partner Violence: Not At Risk (02/24/2024)   Humiliation, Afraid, Rape, and Kick questionnaire    Fear of Current or Ex-Partner: No    Emotionally Abused: No    Physically Abused: No    Sexually Abused: No    Family History  Problem Relation Age of Onset   Alzheimer's disease Mother     Past Surgical History:  Procedure Laterality Date   CARDIOVERSION N/A 03/24/2014   Procedure: CARDIOVERSION;  Surgeon: Wilbert JONELLE Bihari, MD;  Location: [facility] ENDOSCOPY;  Service: Cardiovascular;  Laterality: N/A;   CHOLECYSTECTOMY N/A 12/15/2023   Procedure: LAPAROSCOPIC CHOLECYSTECTOMY;  Surgeon: Vernetta Berg, MD;  Location: [facility] OR;  Service: General;  Laterality: N/A;   ESOPHAGOGASTRODUODENOSCOPY (EGD) WITH PROPOFOL  N/A 12/14/2023   Procedure: ESOPHAGOGASTRODUODENOSCOPY (EGD) WITH PROPOFOL ;  Surgeon: Rollin Dover, MD;  Location: [facility] ENDOSCOPY;  Service: Gastroenterology;  Laterality: N/A;   EUS N/A 12/14/2023   Procedure: UPPER ENDOSCOPIC ULTRASOUND (EUS) LINEAR;  Surgeon: Rollin Dover, MD;  Location: [facility] ENDOSCOPY;  Service: Gastroenterology;  Laterality: N/A;   LEFT HEART CATH AND CORONARY ANGIOGRAPHY N/A 08/20/2022   Procedure: LEFT HEART CATH AND CORONARY ANGIOGRAPHY;  Surgeon: Dann Candyce RAMAN, MD;  Location[facility] INVASIVE CV LAB;  Service: Cardiovascular;  Laterality: N/A;    ROS: Review of Systems Negative except as stated above  PHYSICAL EXAM: BP (!) 159/68   Pulse (!) 51   Temp 97.6 F (36.4 C) (Oral)   Ht 5' 4 (1.626 m)   Wt 148 lb (67.1 kg)   SpO2 100%   BMI 25.40 kg/m   Wt Readings from Last 3 Encounters:  06/24/24 148 lb (67.1 kg)  02/24/24 155 lb 12.8 oz (70.7 kg)  01/12/24 152 lb (68.9 kg)    Physical Exam  General appearance - alert, frail appearing  elderly Caucasian female and in no distress.  Patient is in a wheelchair. Mental status - normal mood, behavior, speech, dress, motor activity, and thought processes Mouth - mucous membranes moist, pharynx normal without lesions Neck - supple,  no significant adenopathy Chest - clear to auscultation, no wheezes, rales or rhonchi, symmetric air entry Heart - normal rate, regular rhythm, normal S1, S2, no murmurs, rubs, clicks or gallops Extremities - peripheral pulses normal, no pedal edema, no clubbing or cyanosis      Latest Ref Rng & Units 01/23/2024    2:14 PM 01/12/2024    2:24 PM 12/25/2023    2:22 PM  CMP  Glucose 70 - 99 mg/dL 885  896  896   BUN 8 - 27 mg/dL 27  26  13    Creatinine 0.57 - 1.00 mg/dL 8.77  8.90  8.86   Sodium 134 - 144 mmol/L 133  135  138   Potassium 3.5 - 5.2 mmol/L 5.1  4.4  5.8   Chloride 96 - 106 mmol/L 95  96  97   CO2 20 - 29 mmol/L 23  24  26    Calcium  8.7 - 10.3 mg/dL 9.6  9.7  9.7   Total Protein 6.0 - 8.5 g/dL   6.4   Total Bilirubin 0.0 - 1.2 mg/dL   0.7   Alkaline Phos 44 - 121 IU/L   145   AST 0 - 40 IU/L   22   ALT 0 - 32 IU/L   10    Lipid Panel     Component Value Date/Time   CHOL 100 10/02/2023 1639   TRIG 71 10/02/2023 1639   HDL 44 10/02/2023 1639   CHOLHDL 2.3 10/02/2023 1639   CHOLHDL 2.1 08/18/2022 0620   VLDL 12 08/18/2022 0620   LDLCALC 41 10/02/2023 1639    CBC    Component Value Date/Time   WBC 5.0 12/25/2023 1422   WBC 7.6 12/16/2023 0355   RBC 3.49 (L) 12/25/2023 1422   RBC 4.11 12/16/2023 0355   HGB 10.0 (L) 12/25/2023 1422   HCT 31.6 (L) 12/25/2023 1422   PLT 310 12/25/2023 1422   MCV 91 12/25/2023 1422   MCH 28.7 12/25/2023 1422   MCH 29.0 12/16/2023 0355   MCHC 31.6 12/25/2023 1422   MCHC 32.6 12/16/2023 0355   RDW 12.8 12/25/2023 1422   LYMPHSABS 1.4 12/25/2023 1422   MONOABS 0.3 12/16/2023 0355   EOSABS 0.2 12/25/2023 1422   BASOSABS 0.1 12/25/2023 1422    ASSESSMENT AND PLAN: 1. Diabetes mellitus  treated with oral medication (HCC) (Primary) At goal.  Continue Farxiga  and healthy eating habits - POCT glycosylated hemoglobin (Hb A1C) - POCT glucose (manual entry) - CBC - Basic Metabolic Panel  2. Chronic systolic congestive heart failure (HCC) Stable and compensated.  Continue carvedilol  3.25 mg twice a day, Entresto  24/26 mg twice a day, spironolactone  12.5 mg daily and furosemide  as needed.  3. Hypertension associated with type 2 diabetes mellitus (HCC) Not at goal.  Add amlodipine  2.5 mg daily.  Continue carvedilol  3.25 mg twice a day, Entresto  24/26 mg twice a day, spironolactone  12.5 mg daily and furosemide  20 mg as needed. - amLODipine  (NORVASC ) 2.5 MG tablet; Take 1 tablet (2.5 mg total) by mouth daily.  Dispense: 90 tablet; Refill: 1  4. Stage 3b chronic kidney disease (HCC) GFR has been stable in the 40s.  We will continue to monitor.  She has upcoming appointment with nephrology next month.  5. Weight loss Patient granddaughter attributes the weight loss to the elimination of sugary drinks from her diet and sugary snacks.  Patient denies any symptoms of depression.  We will check thyroid  hormone level.  Patient is not interested  in any extensive workup including advanced imaging studies like CAT scan of the chest, abdomen and pelvis. - TSH+T4F+T3Free  6. Age related osteoporosis, unspecified pathological fracture presence Will get her in with endocrinology.  Advised her to continue taking vitamin D  supplement at least 800 international units daily and calcium  supplement 600 mg twice a day. - VITAMIN D  25 Hydroxy (Vit-D Deficiency, Fractures) - Ambulatory referral to Endocrinology  7. Permanent atrial fibrillation (HCC) Continue Eliquis  and carvedilol   8. Need for influenza vaccination Given flu shot today.   Patient was given the opportunity to ask questions.  Patient verbalized understanding of the plan and was able to repeat key elements of the plan.   This  documentation was completed using Paediatric nurse.  Any transcriptional errors are unintentional.  Orders Placed This Encounter  Procedures   Flu vaccine HIGH DOSE PF(Fluzone Trivalent)   TSH+T4F+T3Free   VITAMIN D  25 Hydroxy (Vit-D Deficiency, Fractures)   CBC   Basic Metabolic Panel   Ambulatory referral to Endocrinology   POCT glycosylated hemoglobin (Hb A1C)   POCT glucose (manual entry)     Requested Prescriptions   Signed Prescriptions Disp Refills   amLODipine  (NORVASC ) 2.5 MG tablet 90 tablet 1    Sig: Take 1 tablet (2.5 mg total) by mouth daily.    Return in about 4 months (around 10/24/2024).  Barnie Louder, MD, FACP

## 2024-06-24 NOTE — Patient Instructions (Signed)
 VISIT SUMMARY:  Today, we reviewed your diabetes management, heart conditions, kidney disease, and other health concerns. We discussed your medications, dietary habits, and recent weight loss. We also planned for further monitoring and specialist consultations.  YOUR PLAN:  -TYPE 2 DIABETES MELLITUS: Type 2 diabetes is a condition where your body does not use insulin  properly, leading to high blood sugar levels. You will continue taking Farxiga  as prescribed. We encourage you to make dietary modifications for a balanced caloric intake. We will also order thyroid  function tests to rule out other causes of weight loss.  -CHRONIC SYSTOLIC HEART FAILURE: Chronic systolic heart failure is a condition where the heart does not pump blood as well as it should. We will increase your Entresto  dosage from 24 mg to 49 mg and add amlodipine  2.5 mg once daily. Your blood pressure will be monitored at your upcoming kidney specialist appointment.  -PERMANENT ATRIAL FIBRILLATION: Atrial fibrillation is an irregular and often rapid heart rate. You will continue taking Eliquis  as prescribed to prevent blood clots.  -CHRONIC KIDNEY DISEASE, STAGE 3: Chronic kidney disease is a condition where the kidneys gradually lose function. You have an upcoming appointment with a kidney specialist who will monitor your kidney function.  -ABNORMAL WEIGHT LOSS: You have lost 7 pounds over the past four months, likely due to dietary changes. We will order thyroid  function tests and encourage you to maintain an adequate caloric intake. We will monitor your weight and consider further workup if the weight loss continues.  -AGE-RELATED OSTEOPOROSIS WITHOUT CURRENT PATHOLOGICAL FRACTURE: Osteoporosis is a condition where bones become weak and brittle. You will continue taking vitamin D3 and calcium  supplements. We will refer you to an endocrinologist for further management and check your vitamin D  level.  -HYPERTENSION: Hypertension is  high blood pressure. We will increase your Entresto  dosage from 24 mg to 49 mg and add amlodipine  2.5 mg once daily. Your blood pressure will be monitored at your upcoming kidney specialist appointment.  INSTRUCTIONS:  Please follow up with your kidney specialist on September 3rd for further evaluation of your chronic kidney disease and blood pressure monitoring. Continue taking your medications as prescribed and make the recommended dietary changes. We will order thyroid  function tests and check your vitamin D  level. If you experience any new symptoms or have concerns, please contact our office.

## 2024-06-25 ENCOUNTER — Ambulatory Visit: Payer: Self-pay | Admitting: Internal Medicine

## 2024-06-25 ENCOUNTER — Telehealth: Payer: Self-pay

## 2024-06-25 LAB — BASIC METABOLIC PANEL WITH GFR
BUN/Creatinine Ratio: 20 (ref 12–28)
BUN: 23 mg/dL (ref 8–27)
CO2: 21 mmol/L (ref 20–29)
Calcium: 10.2 mg/dL (ref 8.7–10.3)
Chloride: 99 mmol/L (ref 96–106)
Creatinine, Ser: 1.14 mg/dL — ABNORMAL HIGH (ref 0.57–1.00)
Glucose: 101 mg/dL — ABNORMAL HIGH (ref 70–99)
Potassium: 4.9 mmol/L (ref 3.5–5.2)
Sodium: 137 mmol/L (ref 134–144)
eGFR: 47 mL/min/1.73 — ABNORMAL LOW (ref >59)

## 2024-06-25 LAB — VITAMIN D 25 HYDROXY (VIT D DEFICIENCY, FRACTURES): Vit D, 25-Hydroxy: 55.8 ng/mL (ref 30.0–100.0)

## 2024-06-25 LAB — CBC
Hematocrit: 35.1 % (ref 34.0–46.6)
Hemoglobin: 11.1 g/dL (ref 11.1–15.9)
MCH: 28.8 pg (ref 26.6–33.0)
MCHC: 31.6 g/dL (ref 31.5–35.7)
MCV: 91 fL (ref 79–97)
Platelets: 209 x10E3/uL (ref 150–450)
RBC: 3.85 x10E6/uL (ref 3.77–5.28)
RDW: 12.3 % (ref 11.7–15.4)
WBC: 4.7 x10E3/uL (ref 3.4–10.8)

## 2024-06-25 LAB — TSH+T4F+T3FREE
Free T4: 1.06 ng/dL (ref 0.82–1.77)
T3, Free: 2.4 pg/mL (ref 2.0–4.4)
TSH: 1.99 u[IU]/mL (ref 0.450–4.500)

## 2024-06-25 NOTE — Telephone Encounter (Signed)
 Patient's daughter brought OTC VIT D 2,000 units, she is wanting to know if this is too much for the patient to take or is she ok to take it.

## 2024-06-25 NOTE — Telephone Encounter (Signed)
 Recent Vitamin D  level was good. So if this is the dose she has been taking, she can continue.

## 2024-06-29 NOTE — Telephone Encounter (Signed)
 Called & spoke to [name] (authorized to receive information per DPR on file). Informed [name] that per Dr.Johnson the recent vitamin D  level was good. So if this is the dose she has been taking, she can continue. [name] expressed verbal understanding. No further questions at this time.

## 2024-07-09 ENCOUNTER — Other Ambulatory Visit: Payer: Self-pay | Admitting: Physician Assistant

## 2024-07-09 DIAGNOSIS — N1831 Chronic kidney disease, stage 3a: Secondary | ICD-10-CM

## 2024-07-12 ENCOUNTER — Other Ambulatory Visit: Payer: Self-pay | Admitting: Internal Medicine

## 2024-07-12 DIAGNOSIS — I5020 Unspecified systolic (congestive) heart failure: Secondary | ICD-10-CM

## 2024-07-20 ENCOUNTER — Other Ambulatory Visit: Payer: Self-pay | Admitting: Cardiology

## 2024-07-20 ENCOUNTER — Other Ambulatory Visit: Payer: Self-pay | Admitting: Internal Medicine

## 2024-07-20 DIAGNOSIS — I5020 Unspecified systolic (congestive) heart failure: Secondary | ICD-10-CM

## 2024-07-26 ENCOUNTER — Other Ambulatory Visit: Payer: Self-pay | Admitting: Internal Medicine

## 2024-07-26 DIAGNOSIS — I5020 Unspecified systolic (congestive) heart failure: Secondary | ICD-10-CM

## 2024-08-02 ENCOUNTER — Other Ambulatory Visit: Payer: Self-pay

## 2024-08-05 ENCOUNTER — Other Ambulatory Visit: Payer: Self-pay

## 2024-08-19 ENCOUNTER — Ambulatory Visit (INDEPENDENT_AMBULATORY_CARE_PROVIDER_SITE_OTHER): Payer: Self-pay | Admitting: "Endocrinology

## 2024-08-19 ENCOUNTER — Encounter: Payer: Self-pay | Admitting: "Endocrinology

## 2024-08-19 VITALS — BP 116/46 | HR 56

## 2024-08-19 DIAGNOSIS — M810 Age-related osteoporosis without current pathological fracture: Secondary | ICD-10-CM | POA: Diagnosis not present

## 2024-08-19 MED ORDER — CALCIUM 600 MG PO TABS: 600.0000 mg | ORAL_TABLET | Freq: Every day | ORAL | 1 refills | Status: AC

## 2024-08-19 NOTE — Progress Notes (Unsigned)
 Endocrinology Consult Note                                            08/19/2024, 6:32 PM   Subjective:    Patient ID: Anna [name], female    DOB: Mar 06, 1936, PCP Vicci Barnie NOVAK, MD   Past Medical History:  Diagnosis Date   Dysrhythmia    Hyperlipidemia    Hypertension    Mild CAD    Mitral regurgitation    trivial   Mitral stenosis    mild   Osteoarthritis    Overweight(278.02)    Permanent atrial fibrillation (HCC)    Takotsubo cardiomyopathy    Past Surgical History:  Procedure Laterality Date   CARDIOVERSION N/A 03/24/2014   Procedure: CARDIOVERSION;  Surgeon: Wilbert JONELLE Bihari, MD;  Location: [facility] ENDOSCOPY;  Service: Cardiovascular;  Laterality: N/A;   CHOLECYSTECTOMY N/A 12/15/2023   Procedure: LAPAROSCOPIC CHOLECYSTECTOMY;  Surgeon: Vernetta Berg, MD;  Location: [facility] OR;  Service: General;  Laterality: N/A;   ESOPHAGOGASTRODUODENOSCOPY (EGD) WITH PROPOFOL  N/A 12/14/2023   Procedure: ESOPHAGOGASTRODUODENOSCOPY (EGD) WITH PROPOFOL ;  Surgeon: Rollin Dover, MD;  Location: [facility] ENDOSCOPY;  Service: Gastroenterology;  Laterality: N/A;   EUS N/A 12/14/2023   Procedure: UPPER ENDOSCOPIC ULTRASOUND (EUS) LINEAR;  Surgeon: Rollin Dover, MD;  Location: [facility] ENDOSCOPY;  Service: Gastroenterology;  Laterality: N/A;   LEFT HEART CATH AND CORONARY ANGIOGRAPHY N/A 08/20/2022   Procedure: LEFT HEART CATH AND CORONARY ANGIOGRAPHY;  Surgeon: Dann Candyce RAMAN, MD;  Location: [facility] INVASIVE CV LAB;  Service: Cardiovascular;  Laterality: N/A;   Social History   Socioeconomic History   Marital status: Divorced    Spouse name: Not on file   Number of children: 3   Years of education: Not on file   Highest education level: Not on file  Occupational History   Occupation: retired  Tobacco Use   Smoking status: Never   Smokeless tobacco: Never  Vaping Use   Vaping status: Never Used  Substance and Sexual Activity   Alcohol use: No   Drug use: No   Sexual activity: Not on  file  Other Topics Concern   Not on file  Social History Narrative   Not on file   Social Drivers of Health   Financial Resource Strain: Low Risk  (02/24/2024)   Overall Financial Resource Strain (CARDIA)    Difficulty of Paying Living Expenses: Not hard at all  Food Insecurity: No Food Insecurity (02/24/2024)   Hunger Vital Sign    Worried About Running Out of Food in the Last Year: Never true    Ran Out of Food in the Last Year: Never true  Transportation Needs: No Transportation Needs (02/24/2024)   PRAPARE - Administrator, Civil Service (Medical): No    Lack of Transportation (Non-Medical): No  Physical Activity: Insufficiently Active (02/24/2024)   Exercise Vital Sign    Days of Exercise per Week: 6 days    Minutes of Exercise per Session: 20 min  Stress: No Stress Concern Present (02/24/2024)   [employer] of Occupational Health - Occupational Stress Questionnaire    Feeling of Stress : Not at all  Social Connections: Moderately Isolated (12/16/2023)   Social Connection and Isolation Panel    Frequency of Communication with Friends and Family: More than three times a week    Frequency of Social  Gatherings with Friends and Family: More than three times a week    Attends Religious Services: More than 4 times per year    Active Member of Clubs or Organizations: No    Attends Banker Meetings: Never    Marital Status: Widowed   Family History  Problem Relation Age of Onset   Alzheimer's disease Mother    Outpatient Encounter Medications as of 08/19/2024  Medication Sig   calcium  carbonate (OS-CAL) 600 MG tablet Take 1 tablet (600 mg total) by mouth daily with lunch.   amLODipine  (NORVASC ) 2.5 MG tablet Take 1 tablet (2.5 mg total) by mouth daily.   carvedilol  (COREG ) 3.125 MG tablet Take 1 tablet (3.125 mg total) by mouth 2 (two) times daily with a meal.   Cholecalciferol (VITAMIN D3) 10 MCG (400 UNIT) tablet Take 2 tablets (800 Units total) by  mouth daily.   cyclobenzaprine  (FLEXERIL ) 10 MG tablet Take 0.5 tablets (5 mg total) by mouth 2 (two) times daily as needed for muscle spasms.   dapagliflozin  propanediol (FARXIGA ) 10 MG TABS tablet Take 1 tablet (10 mg total) by mouth daily.   diclofenac  Sodium (VOLTAREN ) 1 % GEL Apply 2 grams to knees 4 times a day as needed.   docusate sodium  (COLACE) 100 MG capsule Take 100 mg by mouth daily.    ELIQUIS  5 MG TABS tablet TAKE 1 TABLET BY MOUTH TWICE A DAY   ENTRESTO  24-26 MG TAKE 1 TABLET BY MOUTH TWICE A DAY   ferrous sulfate  325 (65 FE) MG tablet TAKE 1 TABLET BY MOUTH EVERY OTHER DAY   furosemide  (LASIX ) 20 MG tablet Take 1 tablet (20 mg total) by mouth daily as needed.   HYDROcodone -acetaminophen  (NORCO) 7.5-325 MG tablet Take 1 tablet by mouth 4 (four) times daily as needed.   Misc. Devices Event organiser. Devices MISC Rollator walker   Misc. Devices MISC Hospital bed   naloxone [facility]) nasal spray 4 mg/0.1 mL Place 1 spray into the nose once.   Omega-3 Fatty Acids (FISH OIL CONCENTRATE PO) Take 1 capsule by mouth daily.    ondansetron  (ZOFRAN ) 4 MG tablet Take 1 tablet (4 mg total) by mouth every 6 (six) hours as needed for nausea.   rosuvastatin  (CRESTOR ) 10 MG tablet TAKE 1 TABLET BY MOUTH EVERY MONDAY, WEDNESDAY, FRIDAY, AND SATURDAYS   sodium zirconium cyclosilicate  (LOKELMA ) 5 g packet Take 5 g by mouth daily.   spironolactone  (ALDACTONE ) 25 MG tablet TAKE 1/2 TABLET BY MOUTH EVERY DAY   White Petrolatum -Mineral Oil (SYSTANE NIGHTTIME) OINT Place 1 drop into the left eye 4 (four) times daily.   No facility-administered encounter medications on file as of 08/19/2024.   ALLERGIES: No Known Allergies  VACCINATION STATUS: Immunization History  Administered Date(s) Administered   Fluad Trivalent(High Dose 65+) 10/02/2023   INFLUENZA, HIGH DOSE SEASONAL PF 07/10/2018, 07/19/2019, 08/18/2022, 06/24/2024   Influenza,inj,Quad PF,6+ Mos 06/25/2016, 09/16/2017,  07/07/2020, 07/20/2021   Janssen (J&J) SARS-COV-2 Vaccination 02/07/2020   PFIZER(Purple Top)SARS-COV-2 Vaccination 03/15/2021   Pneumococcal Conjugate-13 09/16/2017   Pneumococcal Polysaccharide-23 03/28/2022   Tdap 03/17/2017   Zoster Recombinant(Shingrix) 10/08/2022, 12/11/2022    HPI TEIANA HAJDUK is 88 y.o. female who presents today with a medical history as above. she is being seen in consultation for osteoporosis requested by Vicci Barnie NOVAK, MD.   Patient is accompanied by her granddaughter. Patient has disequilibrium from diffuse arthritis which forced her to use a walker for balance. She denies any fragility fractures.  She was diagnosed with osteoporosis on her first ever bone density in May 2025.  She is not on treatment yet.   She has medical problems including prediabetes, hypertension, CKD, CHF. She is on medications including amlodipine , carvedilol , Flexeril , Farxiga , Eliquis , Entresto , hydrocodone , Crestor , spironolactone .SABRA She is on a low-dose vitamin D .  She is not on calcium .  She is vitamin D  replete based on her labs from August 2025. She does not report any significant height loss.  No history of nephrolithiasis.   Review of Systems  Constitutional: +mildly fluctuating body weight,  + fatigue, no subjective hyperthermia, no subjective hypothermia Eyes: no blurry vision, no xerophthalmia ENT: no sore throat, no nodules palpated in throat, no dysphagia/odynophagia, no hoarseness Cardiovascular: no Chest Pain, no Shortness of Breath, no palpitations, no leg swelling Respiratory: no cough, no shortness of breath Gastrointestinal: no Nausea/Vomiting/Diarhhea Musculoskeletal: no muscle/joint aches, + disequilibrium using a walker/chair. Skin: no rashes Neurological: no tremors, no numbness, no tingling, no dizziness Psychiatric: no depression, no anxiety  Objective:       08/19/2024    2:31 PM 06/24/2024    6:09 PM 06/24/2024    2:03 PM  Vitals with BMI   Height   5' 4  Weight   148 lbs  BMI   25.39  Systolic 116 159 844  Diastolic 46 68 72  Pulse 56  51    BP (!) 116/46   Pulse (!) 56   Wt Readings from Last 3 Encounters:  06/24/24 148 lb (67.1 kg)  02/24/24 155 lb 12.8 oz (70.7 kg)  01/12/24 152 lb (68.9 kg)    Physical Exam  Constitutional: Recent documented weight was 148 pounds,  Not in acute distress, normal state of mind Eyes: PERRLA, EOMI, no exophthalmos ENT: + dry mucous membranes, no gross thyromegaly, no gross cervical lymphadenopathy Cardiovascular: normal precordial activity, Regular Rate and Rhythm, no Murmur/Rubs/Gallops Respiratory:  adequate breathing efforts, no gross chest deformity, Clear to auscultation bilaterally Gastrointestinal: abdomen soft, Non -tender, No distension, Bowel Sounds present, no gross organomegaly Musculoskeletal: + Mild kyphoscoliosis of thoracic spine, strength intact in all four extremities, no peripheral edema Skin: moist, warm, no rashes Neurological: no tremor with outstretched hands, Deep tendon reflexes normal in bilateral lower extremities.  CMP ( most recent) CMP     Component Value Date/Time   NA 137 06/24/2024 1542   K 4.9 06/24/2024 1542   CL 99 06/24/2024 1542   CO2 21 06/24/2024 1542   GLUCOSE 101 (H) 06/24/2024 1542   GLUCOSE 147 (H) 12/16/2023 0355   BUN 23 06/24/2024 1542   CREATININE 1.14 (H) 06/24/2024 1542   CREATININE 1.04 (H) 06/19/2016 1503   CALCIUM  10.2 06/24/2024 1542   PROT 6.4 12/25/2023 1422   ALBUMIN 4.2 12/25/2023 1422   AST 22 12/25/2023 1422   ALT 10 12/25/2023 1422   ALKPHOS 145 (H) 12/25/2023 1422   BILITOT 0.7 12/25/2023 1422   GFR 54.53 (L) 06/22/2015 1538   EGFR 47 (L) 06/24/2024 1542   GFRNONAA 33 (L) 12/16/2023 0355   GFRNONAA 56 (L) 10/26/2015 1204     Diabetic Labs (most recent): Lab Results  Component Value Date   HGBA1C 5.8 06/24/2024   HGBA1C 6.3 02/24/2024   HGBA1C 6.1 10/02/2023     Lipid Panel ( most  recent) Lipid Panel     Component Value Date/Time   CHOL 100 10/02/2023 1639   TRIG 71 10/02/2023 1639   HDL 44 10/02/2023 1639   CHOLHDL 2.3 10/02/2023 1639  CHOLHDL 2.1 08/18/2022 0620   VLDL 12 08/18/2022 0620   LDLCALC 41 10/02/2023 1639   LABVLDL 15 10/02/2023 1639      Lab Results  Component Value Date   TSH 1.990 06/24/2024   TSH 0.317 (L) 08/23/2022   TSH 3.49 06/25/2016   FREET4 1.06 06/24/2024   FREET4 0.96 08/23/2022        Bone density on Mar 24, 2024 was reviewed with her COMPARISON:  None. Spine and hips were not included in the analysis due to degenerative joint disease and motion artifacts  FINDINGS: Scan quality: Good.   LEFT FOREARM (RADIUS 33%):   BMD (in g/cm2): 0.631, T-score: -2.8,nZ-score: 0.5   FRAX 10-YEAR PROBABILITY OF FRACTURE:   FRAX not reported as there is no evaluable hip.   IMPRESSION: Osteoporosis based on BMD.   Fracture risk is increased. Increased risk is based on low BMD.  Assessment & Plan:   1. Osteoporosis, unspecified osteoporosis type, unspecified pathological fracture presence (Primary)   - Anna [name]  is being seen at a kind request of Vicci Barnie NOVAK, MD. - I have reviewed her available  records and clinically evaluated the patient. - Based on these reviews, she has osteoporosis based on prior DEXA with increased fracture risk. She stands to benefit from therapeutic intervention for osteoporosis  to lower her risk of fracture.  She has normal thyroid  function. With the help of her granddaughter, we had a long discussion about treatment options. She has stage 3a CKD, would like to avoid nephrotoxic medications. Best fitting option for her would be Prolia subcutaneously every 6 months. Her most recent labs are favorable.  She will be contacted when this medication is procured for her send in the clinic. If insurance requires it, Jubbonti would be an option. Importance of continued treatment without  interruption was discussed with the family. She would benefit from a higher calcium  and vitamin D  supplement. Calcium  prescribed calcium  carbonate 600 mg p.o. twice daily, and increase her vitamin D3 to 2000 units daily. She will have CMP in 6 months. Her bone density will be repeated in May 2027. - she is advised to maintain close follow up with Vicci Barnie NOVAK, MD for primary care needs.   -Thank you for involving me in the care of this pleasant patient.  Time spent with the patient: 46  minutes spent in  counseling her about osteoporosis and the rest in obtaining information about her symptoms, reviewing her previous labs/studies (including abstractions from other facilities),  evaluations, and treatments,  and developing a plan to confirm diagnosis and long term treatment based on the latest standards of care/guidelines; and documenting her care.  Anna [name] participated in the discussions, expressed understanding, and voiced agreement with the above plans.  All questions were answered to her satisfaction. she is encouraged to contact clinic should she have any questions or concerns prior to her return visit.  Follow up plan: Return in about 6 months (around 02/17/2025) for Prolia(Jubbonti) Today (ASAP) and P(J) NV, F/U with Pre-visit Labs.   Ranny Earl, MD [facility] [address] [place], [place] [address] Phone: [phone]  Fax: [phone]     08/19/2024, 6:32 PM  This note was partially dictated with voice recognition software. Similar sounding words can be transcribed inadequately or may not  be corrected upon review.

## 2024-08-30 ENCOUNTER — Other Ambulatory Visit: Payer: Self-pay

## 2024-08-30 ENCOUNTER — Telehealth: Payer: Self-pay

## 2024-08-30 DIAGNOSIS — M810 Age-related osteoporosis without current pathological fracture: Secondary | ICD-10-CM

## 2024-08-30 MED ORDER — JUBBONTI 60 MG/ML ~~LOC~~ SOSY
60.0000 mg | PREFILLED_SYRINGE | SUBCUTANEOUS | 0 refills | Status: AC
  Filled 2024-08-31 (×2): qty 1, 180d supply, fill #0

## 2024-08-30 MED ORDER — DENOSUMAB 60 MG/ML ~~LOC~~ SOSY
60.0000 mg | PREFILLED_SYRINGE | SUBCUTANEOUS | 0 refills | Status: DC
  Filled 2024-08-30: qty 1, 180d supply, fill #0

## 2024-08-30 NOTE — Telephone Encounter (Signed)
 Patient's insurance does not cover Prolia. Plan prefers Jubbonti. Please send a new prescription to [facility]. Thanks!

## 2024-08-30 NOTE — Telephone Encounter (Signed)
 Rx for Jubbonti 60mg  SQ every 6 months sent to [facility]. Received message waiting for prior auth.

## 2024-08-30 NOTE — Telephone Encounter (Signed)
 Tried to return pt's granddaughter's Vernia Pinal) call, left a message requesting a return call to the office.

## 2024-08-31 ENCOUNTER — Other Ambulatory Visit: Payer: Self-pay

## 2024-08-31 ENCOUNTER — Telehealth: Payer: Self-pay

## 2024-08-31 ENCOUNTER — Other Ambulatory Visit: Payer: Self-pay | Admitting: Pharmacist

## 2024-08-31 DIAGNOSIS — I5022 Chronic systolic (congestive) heart failure: Secondary | ICD-10-CM

## 2024-08-31 DIAGNOSIS — E119 Type 2 diabetes mellitus without complications: Secondary | ICD-10-CM

## 2024-08-31 MED ORDER — DAPAGLIFLOZIN PROPANEDIOL 10 MG PO TABS: 10.0000 mg | ORAL_TABLET | Freq: Every day | ORAL | 2 refills | Status: DC

## 2024-08-31 NOTE — Progress Notes (Signed)
 Specialty Pharmacy Initial Fill Coordination Note  [name] is a 88 y.o. female contacted today regarding initial fill of specialty medication(s) Denosumab-bbdz (Jubbonti)   Patient requested Courier to Provider Office   Delivery date: 09/02/24   Verified address:  [facility]   Medication will be filled on: 09/01/24   Patient is aware of $0 copayment.

## 2024-08-31 NOTE — Telephone Encounter (Signed)
 Pt's granddaughter called stating the calcium  600mg  pt has found has additional Vitamin D  with it. States pt is already taking Vitamni D3 400 units daily and wanted to make sure the additional Vitamin D  is ok.

## 2024-08-31 NOTE — Progress Notes (Signed)
 Pharmacy Quality Measure Review  This patient is appearing on a report for being at risk of failing the adherence measure for diabetes medications this calendar year.   Medication: Farxiga  Last fill date: 08/27/2024 for 30 day supply  Insurance report was not up to date. No action needed at this time. Of note, current rxn is without refills. New rxn sent to patient's pharmacy.  Herlene Fleeta Morris, PharmD, JAQUELINE, CPP Clinical Pharmacist [facility] [phone]

## 2024-08-31 NOTE — Progress Notes (Signed)
 Pharmacy Patient Advocate Encounter  Insurance verification completed.   The patient is insured through [employer] claim for Jubbonti . Co-pay is $0.  This test claim was processed through [facility]- copay amounts may vary at other pharmacies due to pharmacy/plan contracts, or as the patient moves through the different stages of their insurance plan.

## 2024-08-31 NOTE — Telephone Encounter (Signed)
 Spoke with pt's granddaughter making her aware a total daily Vitamin D  dose 64f [phone] units will be enough for pt per Dr.Nida. Pt's granddaughter voiced understanding.

## 2024-09-01 ENCOUNTER — Other Ambulatory Visit: Payer: Self-pay

## 2024-09-01 ENCOUNTER — Telehealth: Payer: Self-pay

## 2024-09-01 ENCOUNTER — Other Ambulatory Visit (HOSPITAL_COMMUNITY): Payer: Self-pay

## 2024-09-01 NOTE — Telephone Encounter (Signed)
 Pharmacy Patient Advocate Encounter   Received notification from Pt Calls Messages that prior authorization for Jubbonti is required/requested.   Insurance verification completed.   The patient is insured through [place].   Per test claim: Refill too soon. PA is not needed at this time. Medication was filled 09/01/2024. Next eligible fill date is 01/14/2025.   It looks like pt picked this up today for a $0 copay. This is what her insurance prefers. I am not seeing that a PA is needed at this time

## 2024-09-03 ENCOUNTER — Other Ambulatory Visit: Payer: Self-pay

## 2024-09-06 ENCOUNTER — Other Ambulatory Visit: Payer: Self-pay

## 2024-09-07 ENCOUNTER — Other Ambulatory Visit (HOSPITAL_BASED_OUTPATIENT_CLINIC_OR_DEPARTMENT_OTHER): Payer: Self-pay | Admitting: Nurse Practitioner

## 2024-09-07 ENCOUNTER — Ambulatory Visit (HOSPITAL_BASED_OUTPATIENT_CLINIC_OR_DEPARTMENT_OTHER)
Admission: RE | Admit: 2024-09-07 | Discharge: 2024-09-07 | Disposition: A | Discharge status: Home / Self Care | Source: Ambulatory Visit | Attending: Nurse Practitioner | Admitting: Nurse Practitioner

## 2024-09-07 ENCOUNTER — Other Ambulatory Visit: Payer: Self-pay

## 2024-09-07 ENCOUNTER — Ambulatory Visit

## 2024-09-07 VITALS — BP 132/48 | HR 52

## 2024-09-07 DIAGNOSIS — M25561 Pain in right knee: Secondary | ICD-10-CM | POA: Insufficient documentation

## 2024-09-07 DIAGNOSIS — M810 Age-related osteoporosis without current pathological fracture: Secondary | ICD-10-CM

## 2024-09-07 DIAGNOSIS — M25562 Pain in left knee: Secondary | ICD-10-CM | POA: Diagnosis present

## 2024-09-07 MED ORDER — DENOSUMAB-BBDZ 60 MG/ML ~~LOC~~ SOSY
60.0000 mg | PREFILLED_SYRINGE | Freq: Once | SUBCUTANEOUS | Status: AC
  Administered 2024-09-07: 60 mg via SUBCUTANEOUS

## 2024-09-07 NOTE — Progress Notes (Signed)
 Pt seen for Nurse's Visit to receive Jubbonti injection. Pt provided Jubbonti 60mg  ( Lot #[id number], Exp. Date 01/24/2026). Discussed with pt and family possible side effects, answered pt's questions. Jubbonti 60mg  given SQ in upper R arm without difficulty. Pt waited the required 15 minutes post injection, no adverse side effects noted.

## 2024-09-27 ENCOUNTER — Other Ambulatory Visit: Payer: Self-pay | Admitting: Pharmacist

## 2024-09-27 NOTE — Progress Notes (Signed)
 Pharmacy Quality Measure Review  This patient is appearing on a report for the adherence measure for Farxiga  this calendar year.   Medication: Farxiga  Last fill date: 08/27/2024 for 30 day supply  Call placed to patient's pharmacy. Confirmed medication is ready for pick-up. Patient's granddaughter called and informed.   Herlene Fleeta Morris, PharmD, JAQUELINE, CPP Clinical Pharmacist [facility] [phone]

## 2024-10-06 ENCOUNTER — Other Ambulatory Visit: Payer: Self-pay | Admitting: Pharmacist

## 2024-10-06 NOTE — Progress Notes (Signed)
 Pharmacy Quality Measure Review  This patient is appearing on a report for the adherence measure for Farxiga  this calendar year.   Medication: Farxiga  Last fill date: 10/04/24 for 30 day supply  Call placed to patient's pharmacy and her granddaughter last week. Rxn was picked up 10/04/24 for a 30-day. Reminder set for next fill.   Herlene Fleeta Morris, PharmD, JAQUELINE, CPP Clinical Pharmacist [facility] [phone]

## 2024-10-26 ENCOUNTER — Other Ambulatory Visit: Payer: Self-pay

## 2024-10-29 ENCOUNTER — Encounter: Payer: Self-pay | Admitting: Internal Medicine

## 2024-10-29 ENCOUNTER — Other Ambulatory Visit: Payer: Self-pay

## 2024-10-29 ENCOUNTER — Ambulatory Visit: Attending: Internal Medicine | Admitting: Internal Medicine

## 2024-10-29 VITALS — BP 152/53 | HR 49 | Ht 64.0 in | Wt 149.0 lb

## 2024-10-29 DIAGNOSIS — I152 Hypertension secondary to endocrine disorders: Secondary | ICD-10-CM

## 2024-10-29 DIAGNOSIS — M810 Age-related osteoporosis without current pathological fracture: Secondary | ICD-10-CM | POA: Diagnosis not present

## 2024-10-29 DIAGNOSIS — E119 Type 2 diabetes mellitus without complications: Secondary | ICD-10-CM | POA: Diagnosis not present

## 2024-10-29 DIAGNOSIS — I4821 Permanent atrial fibrillation: Secondary | ICD-10-CM | POA: Diagnosis not present

## 2024-10-29 DIAGNOSIS — I5022 Chronic systolic (congestive) heart failure: Secondary | ICD-10-CM

## 2024-10-29 DIAGNOSIS — N1831 Chronic kidney disease, stage 3a: Secondary | ICD-10-CM | POA: Diagnosis not present

## 2024-10-29 DIAGNOSIS — E1159 Type 2 diabetes mellitus with other circulatory complications: Secondary | ICD-10-CM | POA: Diagnosis not present

## 2024-10-29 DIAGNOSIS — M170 Bilateral primary osteoarthritis of knee: Secondary | ICD-10-CM | POA: Diagnosis not present

## 2024-10-29 DIAGNOSIS — Z7984 Long term (current) use of oral hypoglycemic drugs: Secondary | ICD-10-CM

## 2024-10-29 DIAGNOSIS — I10 Essential (primary) hypertension: Secondary | ICD-10-CM

## 2024-10-29 LAB — POCT GLYCOSYLATED HEMOGLOBIN (HGB A1C): HbA1c, POC (controlled diabetic range): 6.1 % (ref 0.0–7.0)

## 2024-10-29 LAB — GLUCOSE, POCT (MANUAL RESULT ENTRY): POC Glucose: 94 mg/dL — AB (ref 70–99)

## 2024-10-29 MED ORDER — AMLODIPINE BESYLATE 2.5 MG PO TABS: 2.5000 mg | ORAL_TABLET | Freq: Every day | ORAL | 1 refills | Status: AC

## 2024-10-29 MED ORDER — CARVEDILOL 3.125 MG PO TABS: 3.1250 mg | ORAL_TABLET | Freq: Two times a day (BID) | ORAL | 1 refills | Status: AC

## 2024-10-29 MED ORDER — SACUBITRIL-VALSARTAN 24-26 MG PO TABS: 1.0000 | ORAL_TABLET | Freq: Two times a day (BID) | ORAL | 2 refills | Status: AC

## 2024-10-29 MED ORDER — DICLOFENAC SODIUM 1 % EX GEL
CUTANEOUS | 2 refills | Status: AC
  Filled 2024-10-29: qty 300, 37d supply, fill #0
  Filled 2024-11-30: qty 400, 50d supply, fill #1

## 2024-10-29 NOTE — Progress Notes (Signed)
 "   Patient ID: Anna [name], female    DOB: [birth year]  MRN: [id number]  CC: Diabetes (DM f/u. Med refills. /Voltaren  needs to be sent to our pharmacy/Already received flu vax. )   Subjective: Anna [name] is a 89 y.o. female who presents for chronic ds management. Her concerns today include:  History of DM, atrial flutter/A.fib on Eliquis  with history of cardioversion 2015, systolic CHF/stress-induced cardiomyopathy HTN, trivial AR, mild MR,OA knees, HL, prediabetes, CKD 3, ACD, Osteoporosis (Dr. Lenis on Prolia ), lumbar radiculopathy, hx of vertebral fx receiving pain management through Dr. Norman [facility]  Discussed the use of AI scribe software for clinical note transcription with the patient, who gave verbal consent to proceed.  History of Present Illness Anna [name] is an 89 year old female with diabetes, hypertension, congestive heart failure, and chronic kidney disease who presents for follow-up of her chronic medical conditions. She is accompanied by her granddaughter, Anna [name].  DM: Her diabetes management includes Farxiga , and her A1c has increased slightly from 5.8 to 6.1 over the past four months, remaining below the target of 7. She has dry eyes and uses a nighttime ointment for management. She is scheduled for a diabetic eye exam on January 26th.  HTN/CHF:  she is on Amlodipine  2.5 mg  daily, Entresto  24/26 mg twice a day, carvedilol  3.125 mg twice a day, spironolactone  12.5 mg daily, and furosemide  as needed. Reports being out of one of these meds for a while but does not recall the name only the shape.She did not bring meds with her today.   No chest pain, shortness of breath, leg swelling, or heart racing.  She has a history of atrial fibrillation and is on Eliquis  5 mg twice a day. No bleeding issues reported.  Her weight has remained stable at 149 pounds, similar to four months ago. Reports she is eating okay.  For osteoporosis, she saw Dr. Lenis since  last visit and was started on Prolia  to be administered 2x/yr. she takes a combined calcium  and vitamin D  supplement once a day  HL: She also takes rosuvastatin  for cholesterol management on Monday, Wednesday, and Friday.  CKD: She has chronic kidney disease with a glomerular filtration rate in the 40s, last checked in August with a level of 47. She is scheduled to see her kidney specialist on January 15th in f/u.  She has dry eyes and uses a nighttime ointment for management. She is scheduled for a diabetic eye exam on January 26th.  Request refill on diclofenac  gel for her knees.  She continues to follow with pain management through [facility].  HM: She has received her flu and pneumonia vaccinations and is up to date with them.    Patient Active Problem List   Diagnosis Date Noted   Hypertension associated with type 2 diabetes mellitus (HCC) 02/24/2024   Acute cholecystitis due to biliary calculus 12/13/2023   Ovarian mass, left 12/13/2023   Acute cholecystitis 12/13/2023   CKD (chronic kidney disease), stage II 12/13/2023   Chronic diastolic CHF preserved EF 55 to 60% (congestive heart failure) (HCC) 12/13/2023   Chronic pain syndrome 12/13/2023   Takotsubo cardiomyopathy 07/09/2023   Elevated troponin    Acute heart failure [facility])    NSTEMI (non-ST elevated myocardial infarction) (HCC) 08/17/2022   Mitral stenosis 05/07/2021   Recurrent falls 04/24/2021   History of vertebral fracture 04/24/2021   Lumbar radiculopathy 04/24/2021   Anemia due to stage 3a chronic kidney disease (  HCC) 03/07/2020   Stage 3a chronic kidney disease (HCC) 03/07/2020   Mixed incontinence urge and stress 03/09/2018   Prediabetes 03/09/2018   OA (osteoarthritis) of knee 03/09/2018   Sinus bradycardia 12/18/2016   Estrogen deficiency 07/03/2016   Mitral regurgitation 03/01/2014   Chronic atrial flutter (HCC) 03/01/2014   Permanent atrial fibrillation (HCC) 12/24/2013   Essential hypertension,  benign 12/24/2013   Heart murmur 12/24/2013   Hyperlipemia 12/24/2013     Medications Ordered Prior to Encounter[1]  Allergies[2]  Social History   Socioeconomic History   Marital status: Divorced    Spouse name: Not on file   Number of children: 3   Years of education: Not on file   Highest education level: Not on file  Occupational History   Occupation: retired  Tobacco Use   Smoking status: Never   Smokeless tobacco: Never  Vaping Use   Vaping status: Never Used  Substance and Sexual Activity   Alcohol use: No   Drug use: No   Sexual activity: Not on file  Other Topics Concern   Not on file  Social History Narrative   Not on file   Social Drivers of Health   Tobacco Use: Low Risk (08/19/2024)   Patient History    Smoking Tobacco Use: Never    Smokeless Tobacco Use: Never    Passive Exposure: Not on file  Financial Resource Strain: Low Risk (02/24/2024)   Overall Financial Resource Strain (CARDIA)    Difficulty of Paying Living Expenses: Not hard at all  Food Insecurity: No Food Insecurity (02/24/2024)   Hunger Vital Sign    Worried About Running Out of Food in the Last Year: Never true    Ran Out of Food in the Last Year: Never true  Transportation Needs: No Transportation Needs (02/24/2024)   PRAPARE - Administrator, Civil Service (Medical): No    Lack of Transportation (Non-Medical): No  Physical Activity: Insufficiently Active (02/24/2024)   Exercise Vital Sign    Days of Exercise per Week: 6 days    Minutes of Exercise per Session: 20 min  Stress: No Stress Concern Present (02/24/2024)   [employer] of Occupational Health - Occupational Stress Questionnaire    Feeling of Stress : Not at all  Social Connections: Moderately Isolated (12/16/2023)   Social Connection and Isolation Panel    Frequency of Communication with Friends and Family: More than three times a week    Frequency of Social Gatherings with Friends and Family: More than  three times a week    Attends Religious Services: More than 4 times per year    Active Member of [employer] or Organizations: No    Attends Banker Meetings: Never    Marital Status: Widowed  Intimate Partner Violence: Not At Risk (02/24/2024)   Humiliation, Afraid, Rape, and Kick questionnaire    Fear of Current or Ex-Partner: No    Emotionally Abused: No    Physically Abused: No    Sexually Abused: No  Depression (PHQ2-9): Low Risk (06/24/2024)   Depression (PHQ2-9)    PHQ-2 Score: 2  Alcohol Screen: Low Risk (02/24/2024)   Alcohol Screen    Last Alcohol Screening Score (AUDIT): 0  Housing: Low Risk (02/24/2024)   Housing Stability Vital Sign    Unable to Pay for Housing in the Last Year: No    Number of Times Moved in the Last Year: 0    Homeless in the Last Year: No  Utilities: Not At  Risk (02/24/2024)   AHC Utilities    Threatened with loss of utilities: No  Health Literacy: Adequate Health Literacy (02/24/2024)   B1300 Health Literacy    Frequency of need for help with medical instructions: Never    Family History  Problem Relation Age of Onset   Alzheimer's disease Mother     Past Surgical History:  Procedure Laterality Date   CARDIOVERSION N/A 03/24/2014   Procedure: CARDIOVERSION;  Surgeon: Wilbert JONELLE Bihari, MD;  Location: [facility] ENDOSCOPY;  Service: Cardiovascular;  Laterality: N/A;   CHOLECYSTECTOMY N/A 12/15/2023   Procedure: LAPAROSCOPIC CHOLECYSTECTOMY;  Surgeon: Vernetta Berg, MD;  Location: [facility] OR;  Service: General;  Laterality: N/A;   ESOPHAGOGASTRODUODENOSCOPY (EGD) WITH PROPOFOL  N/A 12/14/2023   Procedure: ESOPHAGOGASTRODUODENOSCOPY (EGD) WITH PROPOFOL ;  Surgeon: Rollin Dover, MD;  Location: [facility] ENDOSCOPY;  Service: Gastroenterology;  Laterality: N/A;   EUS N/A 12/14/2023   Procedure: UPPER ENDOSCOPIC ULTRASOUND (EUS) LINEAR;  Surgeon: Rollin Dover, MD;  Location: [facility] ENDOSCOPY;  Service: Gastroenterology;  Laterality: N/A;   LEFT HEART CATH AND CORONARY  ANGIOGRAPHY N/A 08/20/2022   Procedure: LEFT HEART CATH AND CORONARY ANGIOGRAPHY;  Surgeon: Dann Candyce RAMAN, MD;  Location: [facility] INVASIVE CV LAB;  Service: Cardiovascular;  Laterality: N/A;    ROS: Review of Systems Negative except as stated above  PHYSICAL EXAM: BP (!) 152/53 (BP Location: Left Arm, Patient Position: Sitting, Cuff Size: Normal)   Pulse (!) 49   Ht 5' 4 (1.626 m)   Wt 149 lb (67.6 kg)   SpO2 99%   BMI 25.58 kg/m   Wt Readings from Last 3 Encounters:  10/29/24 149 lb (67.6 kg)  06/24/24 148 lb (67.1 kg)  02/24/24 155 lb 12.8 oz (70.7 kg)  150/49  Physical Exam  General appearance - pleasant elderly female in manual WC in NAD Mental status - pt alert and answers questions appropriately with assistance from Tanya. Neck - supple, no significant adenopathy Chest - clear to auscultation, no wheezes, rales or rhonchi, symmetric air entry Heart - RRR, no gallops Extremities - no LE edema Mouth - tonge is dry Skin: tinting of skin      Latest Ref Rng & Units 06/24/2024    3:42 PM 01/23/2024    2:14 PM 01/12/2024    2:24 PM  CMP  Glucose 70 - 99 mg/dL 898  885  896   BUN 8 - 27 mg/dL 23  27  26    Creatinine 0.57 - 1.00 mg/dL 8.85  8.77  8.90   Sodium 134 - 144 mmol/L 137  133  135   Potassium 3.5 - 5.2 mmol/L 4.9  5.1  4.4   Chloride 96 - 106 mmol/L 99  95  96   CO2 20 - 29 mmol/L 21  23  24    Calcium  8.7 - 10.3 mg/dL 89.7  9.6  9.7    Lipid Panel     Component Value Date/Time   CHOL 100 10/02/2023 1639   TRIG 71 10/02/2023 1639   HDL 44 10/02/2023 1639   CHOLHDL 2.3 10/02/2023 1639   CHOLHDL 2.1 08/18/2022 0620   VLDL 12 08/18/2022 0620   LDLCALC 41 10/02/2023 1639    CBC    Component Value Date/Time   WBC 4.7 06/24/2024 1542   WBC 7.6 12/16/2023 0355   RBC 3.85 06/24/2024 1542   RBC 4.11 12/16/2023 0355   HGB 11.1 06/24/2024 1542   HCT 35.1 06/24/2024 1542   PLT 209 06/24/2024 1542   MCV 91 06/24/2024  1542   MCH 28.8 06/24/2024 1542    MCH 29.0 12/16/2023 0355   MCHC 31.6 06/24/2024 1542   MCHC 32.6 12/16/2023 0355   RDW 12.3 06/24/2024 1542   LYMPHSABS 1.4 12/25/2023 1422   MONOABS 0.3 12/16/2023 0355   EOSABS 0.2 12/25/2023 1422   BASOSABS 0.1 12/25/2023 1422    ASSESSMENT AND PLAN: 1. Diabetes mellitus treated with oral medication (HCC) (Primary) Controlled on Farxiga  - POCT glucose (manual entry) - POCT glycosylated hemoglobin (Hb A1C)  2. Hypertension associated with type 2 diabetes mellitus (HCC) Not at goal She reports being out of one of her blood pressure medications for a while but does not recall which one so I have sent refills on all of them except spironolactone  which I discontinued due to pt appearing too dry on exam. Continue Norvasc  2.5 mg daily, Entresto  24/26 mg twice a day, carvedilol  3.125 mg twice a day - amLODipine  (NORVASC ) 2.5 MG tablet; Take 1 tablet (2.5 mg total) by mouth daily.  Dispense: 90 tablet; Refill: 1 - sacubitril -valsartan  (ENTRESTO ) 24-26 MG; Take 1 tablet by mouth 2 (two) times daily.  Dispense: 180 tablet; Refill: 2  3. Chronic systolic congestive heart failure (HCC) Stable and compensated.  Continue carvedilol , Farxiga  and Entresto .  Spironolactone  discontinued as patient appears dry on exam today.  Advised to use furosemide  as needed if she develops any swelling in the legs or shortness of breath. - carvedilol  (COREG ) 3.125 MG tablet; Take 1 tablet (3.125 mg total) by mouth 2 (two) times daily with a meal.  Dispense: 180 tablet; Refill: 1 - sacubitril -valsartan  (ENTRESTO ) 24-26 MG; Take 1 tablet by mouth 2 (two) times daily.  Dispense: 180 tablet; Refill: 2  4. Permanent atrial fibrillation (HCC) Continue Eliquis .  5. Stage 3a chronic kidney disease (HCC) Stable GFR in the 40s.  6. Age related osteoporosis, unspecified pathological fracture presence Has seen the endocrinologist.  She is on calcium  and vitamin D  supplement.  Also on Prolia .  7. Primary osteoarthritis of  both knees Refill sent on diclofenac  gel - diclofenac  Sodium (VOLTAREN ) 1 % GEL; Apply 2 grams to knees 4 times a day as needed.  Dispense: 400 g; Refill: 2   Patient was given the opportunity to ask questions.  Patient verbalized understanding of the plan and was able to repeat key elements of the plan.   This documentation was completed using Paediatric nurse.  Any transcriptional errors are unintentional.  Orders Placed This Encounter  Procedures   POCT glucose (manual entry)   POCT glycosylated hemoglobin (Hb A1C)     Requested Prescriptions   Signed Prescriptions Disp Refills   diclofenac  Sodium (VOLTAREN ) 1 % GEL 400 g 2    Sig: Apply 2 grams to knees 4 times a day as needed.   carvedilol  (COREG ) 3.125 MG tablet 180 tablet 1    Sig: Take 1 tablet (3.125 mg total) by mouth 2 (two) times daily with a meal.   amLODipine  (NORVASC ) 2.5 MG tablet 90 tablet 1    Sig: Take 1 tablet (2.5 mg total) by mouth daily.   sacubitril -valsartan  (ENTRESTO ) 24-26 MG 180 tablet 2    Sig: Take 1 tablet by mouth 2 (two) times daily.    Return in about 4 months (around 02/26/2025).  Barnie Louder, MD, FACP     [1]  Current Outpatient Medications on File Prior to Visit  Medication Sig Dispense Refill   calcium  carbonate (OS-CAL) 600 MG tablet Take 1 tablet (600 mg total) by  mouth daily with lunch. 90 tablet 1   Cholecalciferol (VITAMIN D3) 10 MCG (400 UNIT) tablet Take 2 tablets (800 Units total) by mouth daily. 120 tablet 2   dapagliflozin  propanediol (FARXIGA ) 10 MG TABS tablet Take 1 tablet (10 mg total) by mouth daily. 30 tablet 2   denosumab -bbdz (JUBBONTI ) 60 MG/ML SOSY injection Inject 60 mg into the skin every 6 (six) months. 1 mL 0   docusate sodium  (COLACE) 100 MG capsule Take 100 mg by mouth daily.      ELIQUIS  5 MG TABS tablet TAKE 1 TABLET BY MOUTH TWICE A DAY 60 tablet 11   ferrous sulfate  325 (65 FE) MG tablet TAKE 1 TABLET BY MOUTH EVERY OTHER DAY 90 tablet  1   furosemide  (LASIX ) 20 MG tablet Take 1 tablet (20 mg total) by mouth daily as needed. 90 tablet 2   HYDROcodone -acetaminophen  (NORCO) 7.5-325 MG tablet Take 1 tablet by mouth 4 (four) times daily as needed.     Misc. Devices Paramedic. Devices MISC Rollator walker 1 Device 0   Misc. Devices MISC Hospital bed 1 Device 0   naloxone [facility]) nasal spray 4 mg/0.1 mL Place 1 spray into the nose once.     Omega-3 Fatty Acids (FISH OIL CONCENTRATE PO) Take 1 capsule by mouth daily.      ondansetron  (ZOFRAN ) 4 MG tablet Take 1 tablet (4 mg total) by mouth every 6 (six) hours as needed for nausea. 20 tablet 0   rosuvastatin  (CRESTOR ) 10 MG tablet TAKE 1 TABLET BY MOUTH EVERY MONDAY, WEDNESDAY, FRIDAY, AND SATURDAYS 48 tablet 2   White Petrolatum -Mineral Oil (SYSTANE NIGHTTIME) OINT Place 1 drop into the left eye 4 (four) times daily.     No current facility-administered medications on file prior to visit.  [2] No Known Allergies  "

## 2024-10-29 NOTE — Patient Instructions (Addendum)
" °  VISIT SUMMARY: Today, you came in for a follow-up visit to manage your chronic medical conditions, including diabetes, hypertension, congestive heart failure, chronic kidney disease, and osteoporosis. You were accompanied by your granddaughter, [name]. We reviewed your current medications, recent lab results, and discussed any new symptoms or concerns.  YOUR PLAN: -TYPE 2 DIABETES MELLITUS: Your A1c level has slightly increased to 6.1, which is still below the target of 7.0. This means your blood sugar levels are well-controlled. Continue taking Farxiga  as prescribed and try to reduce your sugar intake to less than 8 grams per serving.  -CHRONIC SYSTOLIC HEART FAILURE: Your heart failure is being managed with several medications. We have sent a refill for carvedilol , which you were out of. We are holding spironolactone  for now due to your dry mouth and potential dehydration. Use furosemide  as needed for shortness of breath or leg swelling, and try to increase your water intake to 5-6 bottles per day.  -PERMANENT ATRIAL FIBRILLATION: Your atrial fibrillation is being managed with Eliquis , which helps prevent blood clots. Continue taking Eliquis  5 mg twice daily as prescribed.  -STAGE 3A CHRONIC KIDNEY DISEASE: Your kidney function is stable with a glomerular filtration rate in the 40s. Continue monitoring your kidney function and follow up with the kidney specialist on January 15th.  -AGE-RELATED OSTEOPOROSIS: Your osteoporosis is being managed with vitamin D , calcium , and Prolia  injections. Continue taking your supplements and receive Prolia  injections every six months.  -PRIMARY OSTEOARTHRITIS OF BOTH KNEES: Osteoarthritis is a condition that causes joint pain and stiffness. We have sent a refill for your diclofenac  gel to help manage the pain in your knees.  -GENERAL HEALTH MAINTENANCE: You are up to date with your flu and pneumonia vaccinations. Ensure you attend your diabetic eye exam on January  26th and continue with your routine health maintenance.  INSTRUCTIONS: Please follow up with the kidney specialist on January 15th and attend your diabetic eye exam on January 26th. Continue taking your medications as prescribed and follow the dietary and hydration recommendations discussed today.                      Contains text generated by [employer]bridge.                                 Contains text generated by [employer]bridge.   "

## 2024-11-03 ENCOUNTER — Other Ambulatory Visit: Payer: Self-pay | Admitting: Pharmacist

## 2024-11-03 DIAGNOSIS — E119 Type 2 diabetes mellitus without complications: Secondary | ICD-10-CM

## 2024-11-03 DIAGNOSIS — I5022 Chronic systolic (congestive) heart failure: Secondary | ICD-10-CM

## 2024-11-03 MED ORDER — DAPAGLIFLOZIN PROPANEDIOL 10 MG PO TABS: 10.0000 mg | ORAL_TABLET | Freq: Every day | ORAL | 6 refills | Status: AC

## 2024-11-30 ENCOUNTER — Other Ambulatory Visit: Payer: Self-pay

## 2024-12-01 ENCOUNTER — Other Ambulatory Visit: Payer: Self-pay | Admitting: Internal Medicine

## 2024-12-01 DIAGNOSIS — I4821 Permanent atrial fibrillation: Secondary | ICD-10-CM

## 2025-01-04 ENCOUNTER — Other Ambulatory Visit (HOSPITAL_COMMUNITY)

## 2025-02-17 ENCOUNTER — Ambulatory Visit: Admitting: "Endocrinology

## 2025-03-04 ENCOUNTER — Ambulatory Visit: Payer: Self-pay | Admitting: Internal Medicine

## 2025-03-08 ENCOUNTER — Ambulatory Visit: Admitting: "Endocrinology
